# Patient Record
Sex: Female | Born: 1935 | Race: White | Hispanic: No | Smoking: Never smoker
Health system: Southern US, Community
[De-identification: ages and names within clinical notes are randomized; demographics above are authoritative.]

## PROBLEM LIST (undated history)

## (undated) DIAGNOSIS — E785 Hyperlipidemia, unspecified: Secondary | ICD-10-CM

## (undated) DIAGNOSIS — J189 Pneumonia, unspecified organism: Secondary | ICD-10-CM

## (undated) DIAGNOSIS — N390 Urinary tract infection, site not specified: Secondary | ICD-10-CM

## (undated) DIAGNOSIS — F329 Major depressive disorder, single episode, unspecified: Secondary | ICD-10-CM

## (undated) DIAGNOSIS — M8580 Other specified disorders of bone density and structure, unspecified site: Secondary | ICD-10-CM

## (undated) DIAGNOSIS — R55 Syncope and collapse: Secondary | ICD-10-CM

## (undated) DIAGNOSIS — M5090 Cervical disc disorder, unspecified, unspecified cervical region: Secondary | ICD-10-CM

## (undated) DIAGNOSIS — I10 Essential (primary) hypertension: Secondary | ICD-10-CM

## (undated) DIAGNOSIS — N8110 Cystocele, unspecified: Secondary | ICD-10-CM

## (undated) DIAGNOSIS — R413 Other amnesia: Secondary | ICD-10-CM

## (undated) DIAGNOSIS — M51369 Other intervertebral disc degeneration, lumbar region without mention of lumbar back pain or lower extremity pain: Secondary | ICD-10-CM

## (undated) DIAGNOSIS — F32A Depression, unspecified: Secondary | ICD-10-CM

## (undated) DIAGNOSIS — J45909 Unspecified asthma, uncomplicated: Secondary | ICD-10-CM

## (undated) DIAGNOSIS — I341 Nonrheumatic mitral (valve) prolapse: Secondary | ICD-10-CM

## (undated) DIAGNOSIS — Z950 Presence of cardiac pacemaker: Secondary | ICD-10-CM

## (undated) DIAGNOSIS — I872 Venous insufficiency (chronic) (peripheral): Secondary | ICD-10-CM

## (undated) DIAGNOSIS — M5136 Other intervertebral disc degeneration, lumbar region: Secondary | ICD-10-CM

## (undated) DIAGNOSIS — K5790 Diverticulosis of intestine, part unspecified, without perforation or abscess without bleeding: Secondary | ICD-10-CM

## (undated) DIAGNOSIS — E871 Hypo-osmolality and hyponatremia: Secondary | ICD-10-CM

## (undated) DIAGNOSIS — N200 Calculus of kidney: Secondary | ICD-10-CM

## (undated) HISTORY — DX: Hyperlipidemia, unspecified: E78.5

## (undated) HISTORY — DX: Unspecified asthma, uncomplicated: J45.909

## (undated) HISTORY — DX: Other intervertebral disc degeneration, lumbar region without mention of lumbar back pain or lower extremity pain: M51.369

## (undated) HISTORY — DX: Venous insufficiency (chronic) (peripheral): I87.2

## (undated) HISTORY — PX: TONSILLECTOMY: SUR1361

## (undated) HISTORY — DX: Cervical disc disorder, unspecified, unspecified cervical region: M50.90

## (undated) HISTORY — DX: Other specified disorders of bone density and structure, unspecified site: M85.80

## (undated) HISTORY — DX: Other intervertebral disc degeneration, lumbar region: M51.36

## (undated) HISTORY — DX: Syncope and collapse: R55

## (undated) HISTORY — DX: Cystocele, unspecified: N81.10

## (undated) HISTORY — DX: Diverticulosis of intestine, part unspecified, without perforation or abscess without bleeding: K57.90

## (undated) HISTORY — PX: APPENDECTOMY: SHX54

## (undated) HISTORY — DX: Other amnesia: R41.3

## (undated) HISTORY — DX: Major depressive disorder, single episode, unspecified: F32.9

## (undated) HISTORY — DX: Depression, unspecified: F32.A

## (undated) HISTORY — PX: ABDOMINAL HYSTERECTOMY: SHX81

---

## 1999-05-08 ENCOUNTER — Encounter (INDEPENDENT_AMBULATORY_CARE_PROVIDER_SITE_OTHER): Payer: Self-pay | Admitting: Specialist

## 1999-05-08 ENCOUNTER — Other Ambulatory Visit: Admission: RE | Admit: 1999-05-08 | Discharge: 1999-05-08 | Payer: Self-pay | Admitting: Obstetrics & Gynecology

## 1999-10-30 ENCOUNTER — Encounter: Payer: Self-pay | Admitting: Obstetrics & Gynecology

## 1999-10-30 ENCOUNTER — Encounter: Admission: RE | Admit: 1999-10-30 | Discharge: 1999-10-30 | Payer: Self-pay | Admitting: Obstetrics & Gynecology

## 1999-12-17 ENCOUNTER — Encounter: Payer: Self-pay | Admitting: *Deleted

## 1999-12-17 ENCOUNTER — Encounter: Admission: RE | Admit: 1999-12-17 | Discharge: 1999-12-17 | Payer: Self-pay | Admitting: *Deleted

## 2000-01-02 ENCOUNTER — Other Ambulatory Visit: Admission: RE | Admit: 2000-01-02 | Discharge: 2000-01-02 | Payer: Self-pay | Admitting: Urology

## 2000-04-17 ENCOUNTER — Other Ambulatory Visit: Admission: RE | Admit: 2000-04-17 | Discharge: 2000-04-17 | Payer: Self-pay | Admitting: Obstetrics & Gynecology

## 2000-05-29 ENCOUNTER — Encounter: Admission: RE | Admit: 2000-05-29 | Discharge: 2000-05-29 | Payer: Self-pay | Admitting: Orthopedic Surgery

## 2000-05-29 ENCOUNTER — Encounter: Payer: Self-pay | Admitting: Orthopedic Surgery

## 2000-11-02 ENCOUNTER — Encounter: Admission: RE | Admit: 2000-11-02 | Discharge: 2000-11-02 | Payer: Self-pay | Admitting: Geriatric Medicine

## 2000-11-02 ENCOUNTER — Encounter: Payer: Self-pay | Admitting: Geriatric Medicine

## 2000-12-25 ENCOUNTER — Encounter: Payer: Self-pay | Admitting: Geriatric Medicine

## 2000-12-25 ENCOUNTER — Encounter: Admission: RE | Admit: 2000-12-25 | Discharge: 2000-12-25 | Payer: Self-pay | Admitting: Geriatric Medicine

## 2001-03-22 ENCOUNTER — Encounter: Admission: RE | Admit: 2001-03-22 | Discharge: 2001-03-22 | Payer: Self-pay | Admitting: Geriatric Medicine

## 2001-03-22 ENCOUNTER — Encounter: Payer: Self-pay | Admitting: Geriatric Medicine

## 2001-04-27 ENCOUNTER — Ambulatory Visit (HOSPITAL_COMMUNITY): Admission: RE | Admit: 2001-04-27 | Discharge: 2001-04-27 | Payer: Self-pay | Admitting: Gastroenterology

## 2001-05-07 ENCOUNTER — Other Ambulatory Visit: Admission: RE | Admit: 2001-05-07 | Discharge: 2001-05-07 | Payer: Self-pay | Admitting: Obstetrics & Gynecology

## 2001-05-12 ENCOUNTER — Encounter: Payer: Self-pay | Admitting: Gastroenterology

## 2001-05-12 ENCOUNTER — Encounter: Admission: RE | Admit: 2001-05-12 | Discharge: 2001-05-12 | Payer: Self-pay | Admitting: Gastroenterology

## 2001-10-05 ENCOUNTER — Encounter: Payer: Self-pay | Admitting: Geriatric Medicine

## 2001-10-05 ENCOUNTER — Encounter: Admission: RE | Admit: 2001-10-05 | Discharge: 2001-10-05 | Payer: Self-pay | Admitting: Geriatric Medicine

## 2002-05-19 ENCOUNTER — Other Ambulatory Visit: Admission: RE | Admit: 2002-05-19 | Discharge: 2002-05-19 | Payer: Self-pay | Admitting: Obstetrics & Gynecology

## 2002-10-10 ENCOUNTER — Encounter: Payer: Self-pay | Admitting: Geriatric Medicine

## 2002-10-10 ENCOUNTER — Encounter: Admission: RE | Admit: 2002-10-10 | Discharge: 2002-10-10 | Payer: Self-pay | Admitting: Geriatric Medicine

## 2003-10-04 ENCOUNTER — Encounter (INDEPENDENT_AMBULATORY_CARE_PROVIDER_SITE_OTHER): Payer: Self-pay | Admitting: *Deleted

## 2003-10-05 ENCOUNTER — Inpatient Hospital Stay (HOSPITAL_COMMUNITY): Admission: RE | Admit: 2003-10-05 | Discharge: 2003-10-13 | Payer: Self-pay | Admitting: Obstetrics & Gynecology

## 2003-11-20 ENCOUNTER — Encounter: Admission: RE | Admit: 2003-11-20 | Discharge: 2003-11-20 | Payer: Self-pay | Admitting: Geriatric Medicine

## 2004-01-08 ENCOUNTER — Ambulatory Visit (HOSPITAL_COMMUNITY): Admission: RE | Admit: 2004-01-08 | Discharge: 2004-01-08 | Payer: Self-pay | Admitting: Geriatric Medicine

## 2004-11-26 ENCOUNTER — Encounter: Admission: RE | Admit: 2004-11-26 | Discharge: 2004-11-26 | Payer: Self-pay | Admitting: Geriatric Medicine

## 2005-06-11 ENCOUNTER — Other Ambulatory Visit: Admission: RE | Admit: 2005-06-11 | Discharge: 2005-06-11 | Payer: Self-pay | Admitting: Obstetrics & Gynecology

## 2005-12-02 ENCOUNTER — Encounter: Admission: RE | Admit: 2005-12-02 | Discharge: 2005-12-02 | Payer: Self-pay | Admitting: Geriatric Medicine

## 2005-12-31 ENCOUNTER — Encounter: Admission: RE | Admit: 2005-12-31 | Discharge: 2005-12-31 | Payer: Self-pay | Admitting: Geriatric Medicine

## 2006-04-16 ENCOUNTER — Ambulatory Visit (HOSPITAL_COMMUNITY): Admission: RE | Admit: 2006-04-16 | Discharge: 2006-04-17 | Payer: Self-pay | Admitting: Orthopedic Surgery

## 2006-12-21 ENCOUNTER — Encounter: Admission: RE | Admit: 2006-12-21 | Discharge: 2006-12-21 | Payer: Self-pay | Admitting: Geriatric Medicine

## 2007-01-14 ENCOUNTER — Ambulatory Visit (HOSPITAL_COMMUNITY): Admission: RE | Admit: 2007-01-14 | Discharge: 2007-01-14 | Payer: Self-pay | Admitting: Geriatric Medicine

## 2007-12-29 ENCOUNTER — Encounter: Admission: RE | Admit: 2007-12-29 | Discharge: 2007-12-29 | Payer: Self-pay | Admitting: Geriatric Medicine

## 2008-04-07 ENCOUNTER — Encounter: Admission: RE | Admit: 2008-04-07 | Discharge: 2008-04-07 | Payer: Self-pay | Admitting: Gastroenterology

## 2008-12-29 ENCOUNTER — Encounter: Admission: RE | Admit: 2008-12-29 | Discharge: 2008-12-29 | Payer: Self-pay | Admitting: Obstetrics & Gynecology

## 2010-01-02 ENCOUNTER — Encounter: Admission: RE | Admit: 2010-01-02 | Discharge: 2010-01-02 | Payer: Self-pay | Admitting: Obstetrics & Gynecology

## 2010-12-02 ENCOUNTER — Other Ambulatory Visit: Payer: Self-pay | Admitting: Obstetrics & Gynecology

## 2010-12-02 DIAGNOSIS — Z1231 Encounter for screening mammogram for malignant neoplasm of breast: Secondary | ICD-10-CM

## 2010-12-20 NOTE — Op Note (Signed)
NAME:  Gabrielle Hoffman, Gabrielle Hoffman                        ACCOUNT NO.:  [id number]   MEDICAL RECORD NO.:  [id number]                   PATIENT TYPE:  OBV   LOCATION:  9399                                 FACILITY:  [facility]   PHYSICIAN:  Freddy Finner, M.D.                DATE OF BIRTH:  04-14-1931   DATE OF PROCEDURE:  10/04/2003  DATE OF DISCHARGE:                                 OPERATIVE REPORT   PREOPERATIVE DIAGNOSIS:  Cystocele with prolapse, uterine prolapse,  rectocele, with pelvic relaxation symptoms.   POSTOPERATIVE DIAGNOSES:  1. Cystocele with prolapse, uterine prolapse, rectocele, with pelvic     relaxation symptoms.  2. Adhesions of left adnexa.   OPERATIVE PROCEDURE:  Laparoscopy-assisted vaginal hysterectomy, right  salpingo-oophorectomy, lysis of left adnexal adhesions, anterior and  posterior colporrhaphy, sacrospinous ligament suspension of vagina.   ANESTHESIA:  General endotracheal.   ESTIMATED INTRAOPERATIVE BLOOD LOSS:  400 mL.   INTRAOPERATIVE COMPLICATIONS:  None.   The patient was admitted on the morning of surgery, brought to the operating  room, there placed under adequate general anesthesia.  She was in PAS hose  which had been applied preoperatively.  She was given an antibiotic bolus of  Ceftin IV preoperatively.  She was brought to the operating room, placed  under adequate general anesthesia, placed in the dorsal lithotomy position  using the Liberty stirrup system.  Betadine prep of abdomen, perineum, and  vagina was carried out with scrub followed by solution of Betadine.  Sterile  drapes were applied.  Hulka tenaculum was attached to the cervix under  direct visualization.  Bladder was evacuated with a Robinson catheter.  Two  small incisions were made in the abdomen - one at the umbilicus, one just  above the symphysis.  The anterior abdominal wall was elevated manually.  A  bladed 11 mm disposable trocar was introduce at the umbilicus without  difficulty.  A nonbladed 5 mm trocar was placed through the lower incision  under direct visualization.  Examination revealed no evidence of injury on  entry.  The omentum was adherent to the anterior abdominal wall and was  penetrated on placement of the upper trocar but no bleeding was encountered  and no injury.  Examination of pelvic and abdominal contents was carried  out.  There were some adhesions in the right lower quadrant and the appendix  was not visualized.  It is presumed that it may have been removed in 1965 at  the time of her left salpingo-oophorectomy.  There were adhesions along the  round ligament and posterior fundal surface on the left side.  The right  tube and ovary were normal.  The uterus itself appeared to be normal.  Please note that findings are recorded in still photographs which were  retained in the office record.  Using the monopolar reticulated EndoShears  the adhesions in the left adnexa were freed from the uterus and  the round  ligament.  Using the Gyrus tripolar device the upper broad ligament was  dissected, sealed, and divided down to a level just above the uterine artery  on the left, similarly on the right, but this included the infundibulopelvic  ligament with resection of the right tube and ovary.  Attention was then  turned vaginally.  Colpotomy incision was made while tenting the mucosa  posterior to the cervix.  The cervix was circumscribed with a scalpel.  Anatomy was somewhat distorted by prolapse.  Great care was exercised to  carefully excise the bladder off the cervix.  Indigo carmine was injected IV  for use in identifying any potential injury.  Progressively, the uterus was  removed by developing and sealing the uterosacral ligaments on each side  with the LigaSure system.  The bladder pillar was resected with LigaSure,  sealed, and divided.  The anterior peritoneum was entered.  Cardinal  ligament pedicles were taken with the LigaSure,  sealed, and divided.  Please  note that the anterior peritoneum was entered as the uterus was delivered  posteriorly to identify the anterior fold which was far advanced from the  cervix.  No injury was encountered to the bladder.  Uterosacrals were then  anchored to mucosa with figure-of-eights of 0 Monocryl.  The large dissected  area posteriorly was closed and sutured with interrupted figure-of-eights of  0 Monocryl.  The posterior half of the cuff was closed vertically with  figure-of-eights of 0 Monocryl.  The anterior mucosal edges were grasped  with Allis clamps and the mucosa tented.  The bladder was sharply dissected  off of the mucosa and an incision made in the midline to a level just distal  to the urethral meatus by approximately 1.5 cm.  With great care the bladder  was dissected off the mucosa as was the urogenital fascia.  Using 0 Monocryl  sutures, plication of the urogenital fascia was carried out, bringing tissue  laterally to midline on each side.  Urethral depth and patency was checked  with a sterile Tresa Endo.  Urethral length was approximately 2.5 cm.  Segments  of mucosa were excised anteriorly.  The remaining cuff anterior peritoneum  was then closed with figure-of-eights of 0 Monocryl.  A deep suture was  required on the patient's left side before actually closing the mucosa  completely for hemostasis.  Attention was turned posteriorly.  The  fourchette was grasped on each side with an Allis.  A pyramidal-shaped  segment of skin was excised from the perineum.  The mucosa overlying the  rectum was tented and with sharp dissection the mucosa was freed from the  rectum, and with careful sharp and blunt dissection the mucosa was freed  from the perirectal supporting tissues.  The sacrospinous ligament was then  identified with blunt dissection carried up to that level.  Using the  __________ clamp a 0 PDS suture was placed through the right sacrospinous ligament.  This  was anchored to the anterior and posterior vaginal mucosa  using two bites with the Mayo needle.  This suture was then held until the  posterior repair was nearly complete.  This involved plication of the  perirectal tissue medially with interrupted sutures of 0 Monocryl.  Perineal  body and levators were also approximated with interrupted 0 Monocryl.  Using  3-0 Monocryl the posterior mucosa was closed to a level just distal to the  hymenal ring.  At this point the sacrospinous ligament suture was carefully  tied  with adequate elevation of the vagina.  The posterior repair was  completed in a fashion similar to an episiotomy with the 3-0 Monocryl  suture.  The bladder was then filled with irrigating solution.  Blue dye was  noted here and in no other location.  A suprapubic banana catheter was  placed without difficulty and anchored to the skin with 0 nylon sutures.  Two-inch iodoform gauze was then used to pack the vaginal canal.  Reinspection abdominally was then carried out and there were some  significant bleeding sources along the uterosacral pedicles and in the  midline where the uterosacrals were plicated.  These were easily controlled  with the Gyrus bipolar device.  The Nezhat irrigating system was used during  the procedure to irrigate and to confirm hemostasis.  The irrigating  solution was aspirated from the abdomen with adequate hemostasis.  The  procedure was complete.  The skin incisions were closed in interrupted  subcuticular sutures of 3-0 Dexon; 0.5% plain Marcaine was injected into the  incision sites for postoperative analgesia.  Steri-Strips were also applied  to the lower incision.  The patient was awakened and taken to recovery in  good condition.                                               Freddy Finner, M.D.    WRN/MEDQ  D:  10/04/2003  T:  10/04/2003  Job:  [id number]

## 2010-12-20 NOTE — H&P (Signed)
NAME:  Gabrielle Hoffman, Gabrielle Hoffman                        ACCOUNT NO.:  [id number]   MEDICAL RECORD NO.:  [id number]                   PATIENT TYPE:  OBV   LOCATION:  NA                                   FACILITY:  [facility]   PHYSICIAN:  Freddy Finner, M.D.                DATE OF BIRTH:  11-24-1930   DATE OF ADMISSION:  10/04/2003  DATE OF DISCHARGE:                                HISTORY & PHYSICAL   ADMITTING DIAGNOSES:  1. Third degree cystocele.  2. First degree rectocele.  3. Marked uterine descensus.  4. Symptoms of pelvic relaxation.   HISTORY OF PRESENT ILLNESS:  The patient is a 77 year old white widowed  female, gravida 1, para 1, who has been followed in my office since 1993.  Throughout her time in my office she has been on hormone replacement  therapy.  In the last approximately six months, the patient has noted  progressively increasing symptoms and feels that everything is falling out  vaginally.  The symptoms have reached the point that she has requested  surgical intervention, and she is now admitted for that purpose.   PAST MEDICAL HISTORY:  The patient is known to have mild hypertension for  which she takes hydrochlorothiazide 25 mg a day.  She is also known to have  hyperlipidemia, for which she takes Lipitor on a daily basis.  She is also  known to have osteopenia and she is taking hormone replacement therapy and  Fosamax 70 mg a week for this problem.   There are no other significant medical illnesses.   ALLERGIES:  Her only known drug allergy is to codeine.   PAST SURGICAL HISTORY:  1. Previous operative procedures include a vaginal delivery in 1969.  2. She had laparotomy in [place] in 1965 at which time she had removal     of the left tube and ovary as best she can remember and partial resection     of the right ovary done for endometriosis.   There are no other known surgical procedures.   FAMILY HISTORY:  The only remarkable thing in her family history  related to  surgery is she states her son is a hemophiliac.   Based on this and her concerns about the potential for bleeding  intraoperatively, she had preoperative coagulation studies which included a  normal platelet count, a normal prothrombin time, a normal PTT and normal IV  bleeding time.   REVIEW OF SYSTEMS:  Otherwise negative.  No cardiopulmonary, GI or GU  complaints at the present time.   PHYSICAL EXAMINATION:  VITAL SIGNS:  Blood pressure in the office was  136/74.  HEENT:  Grossly within normal limits.  NECK:  The thyroid gland is not palpably enlarged.  CHEST:  Clear to auscultation.  HEART:  Normal sinus rhythm without murmurs, rubs or gallops.  ABDOMEN:  Soft and nontender without appreciable organomegaly or pelvic  masses.  EXTREMITIES:  Without cyanosis, clubbing or edema.  BREASTS:  Exam in the office in the recent past was normal.  PELVIC:  The pelvic findings are as in the admission diagnosis.  No other  remarkable pelvic findings.   ASSESSMENT:  Symptomatic pelvic relaxation with third degree cystocele,  first degree rectocele, and marked uterine descensus.  Various forms of  therapy including different surgical approaches have been discussed with the  patient including the potential for using cadaver grafts and synthetic mesh  for repairs.  We have elected to proceed at this time without the use of  those techniques.  She is now admitted for laparoscopic assisted vaginal  hysterectomy, resection of the remaining ovary or both ovaries if present  and anterior posterior colporrhaphy and sacrospinous ligament suspension.                                               Freddy Finner, M.D.    WRN/MEDQ  D:  10/03/2003  T:  10/03/2003  Job:  [id number]

## 2010-12-20 NOTE — Procedures (Signed)
[place]  Patient:    Gabrielle Hoffman, Gabrielle Hoffman Visit Number: [id number] MRN: [id number]          Service Type: END Location: ENDO Attending Physician:  Orland Mustard Dictated by:   Llana Aliment. Randa Evens, M.D. Proc. Date: 04/27/01 Admit Date:  04/27/2001   CC:         Hal T. Stoneking, M.D.   Procedure Report  PROCEDURE:  Incomplete colonoscopy.  MEDICATIONS:  Fentanyl 75 mcg, Versed 7.5 mg IV.  SCOPE:  Pediatric Olympus video colonoscope.  INDICATIONS:  Colon cancer screening.  DESCRIPTION OF PROCEDURE:  The procedure had been explained to the patient and consent obtained.  With the patient in the left lateral decubitus position digital examination was performed and the Olympus pediatric video colonoscope was inserted and advanced under direct visualization.  The prep was excellent.  The patient had extensive diverticular disease of the sigmoid colon.  Despite multiple maneuvers including position changes we were unable to advance beyond 45 cm.  Extensive diverticular disease in this area.  At this point, I felt it was just simply not safe to attempt to advance any further.  The scope was withdrawn.  The sigmoid colon other than diverticulosis was free of polyps. No other gross lesions were seen.  The scope was withdrawn down the rectum. The rectum was free of polyps.  ASSESSMENT:  Incomplete colonoscopy due to extensive diverticular disease.  PLAN:  Will have the patient call our office next week.  We will obtain a barium enema in the future to evaluate the remainder of her colon.  We will give her an information sheet about diverticulosis. Dictated by:   Llana Aliment. Randa Evens, M.D. Attending Physician:  Orland Mustard DD:  04/27/01 TD:  04/27/01 Job: [id number]

## 2010-12-20 NOTE — Discharge Summary (Signed)
NAME:  Gabrielle Hoffman, Gabrielle Hoffman                        ACCOUNT NO.:  [id number]   MEDICAL RECORD NO.:  [id number]                   PATIENT TYPE:  INP   LOCATION:  9311                                 FACILITY:  [facility]   PHYSICIAN:  Freddy Finner, M.D.                DATE OF BIRTH:  May 20, 1931   DATE OF ADMISSION:  10/04/2003  DATE OF DISCHARGE:  10/13/2003                                 DISCHARGE SUMMARY   DISCHARGE DIAGNOSIS:  Cystocele with prolapse, uterine prolapse, rectocele,  and marked symptoms of pelvic relaxation.   SECONDARY DIAGNOSES:  1. Persistent postoperative nausea of uncertain etiology.  2. Diarrhea, possibly secondary to intravenous antibiotic management.   OTHER COMPLICATIONS:  None.   DISPOSITION:  At the time of her discharge on postoperative day #9 the  patient was ambulating without difficulty.  She was having adequate dietary  intake.  She was voiding without difficulty without a suprapubic catheter.  Her condition is considered to be satisfactory.  She was discharged home  with progressively-increasing activities.  She was given instructions for  symptoms of Clostridium difficile to contact Dr. Matthias Hughs if that occurred.  Her discharge pain medications were Percocet 5/325 one or two q.4-6h. as  needed for postoperative pain.  She was also given Motrin to be taken t.i.d.  on a p.r.n. basis.  She is to return to the office in approximately 3 days.  Otherwise, her instructions were routine.   Details of the present illness, past history, family history, review of  systems, and physical exam recorded in the admission note.  Essentially, the  patient had symptomatic pelvic relaxation.   Her physical findings on admission were remarkable for the pelvic findings  which were essentially as noted in the postoperative note or the discharge  diagnosis.   Laboratory data during this admission included serial CBCs with initial  hemoglobin of 12.3, postoperative hemoglobin  of 9.1 on postoperative day #1,  and several follow-ups were done with a discharge hemoglobin of 8.6.  Admission CMET was normal.  Postoperative electrolytes were mildly abnormal  with potassium of 3.3 and sodium of 131 on postoperative day #1.  By  discharge her potassium was 3.6 and her sodium 138.  Urinalysis on admission  was normal.  Prior to this admission she had had coagulation evaluation  including a bleeding time, all of which were normal.  This was done because  of a history of hemophilia in the family.   HOSPITAL COURSE:  Following admission total vaginal hysterectomy, bilateral  salpingo-oophorectomy, anterior/posterior colporrhaphy, and sacrospinous  ligament suspension of the vagina were accomplished without significant  difficulty.  Postoperatively the ongoing problems were of loss of appetite  and nausea.  Because of the persistence of these symptoms Dr. Matthias Hughs was  asked to see the patient for GI evaluation.  His primary thought on this was  that she might have gastric stasis.  She was treated  with Reglan and  Carafate and on this regimen did make  some improvement, and by the time of her discharge on postoperative day #9  was doing reasonably well.  Her hospital course was otherwise without  significant complications.  She remained essentially afebrile throughout.  The disposition is noted above.                                               Freddy Finner, M.D.    WRN/MEDQ  D:  12/08/2003  T:  12/08/2003  Job:  [id number]   cc:   Bernette Redbird, M.D.  [address]  [place], [place] [address]  Fax: [phone]   Hal T. Stoneking, M.D.  [address] [place], [place] [address]  Fax: [phone]

## 2010-12-20 NOTE — Op Note (Signed)
Gabrielle Hoffman, Gabrielle Hoffman              ACCOUNT NO.:  [id number]   MEDICAL RECORD NO.:  [id number]          PATIENT TYPE:  AMB   LOCATION:  DAY                          FACILITY:  [facility]   PHYSICIAN:  Marlowe Kays, M.D.  DATE OF BIRTH:  12-24-1930   DATE OF PROCEDURE:  04/16/2006  DATE OF DISCHARGE:                                 OPERATIVE REPORT   PREOPERATIVE DIAGNOSES:  1. Glenohumeral arthritis with torn labrum.  2. Rotator cuff tendinopathy secondary to impingement syndrome.   POSTOPERATIVE DIAGNOSES:  1. Glenohumeral arthritis with torn labrum.  2. Rotator cuff tendinopathy secondary to impingement syndrome.   OPERATION:  Right shoulder arthroscopy with:   1. Debridement of torn labrum, removal of loose bodies and some minimal      shaving of the undersurface of the rotator cuff.  2. Arthroscopic subacromial and distal inferior clavicle decompression.   SURGEON:  Marlowe Kays, M.D.   ASSISTANTDruscilla Brownie. Underwood, P.A.-C.   ANESTHESIA:  General.   PATHOLOGY AND JUSTIFICATION FOR PROCEDURE:  For no apparent reason, in April  of this year she developed a pain the right shoulder going down her arm.  Plain x-rays and MRI films have demonstrated the above diagnoses.  Accordingly, she is here today for the above-mentioned surgery.   PROCEDURE:  Satisfactory general anesthesia, beach-chair position on the  Allen frame, right shoulder girdle was prepped with DuraPrep, draped in a  sterile field, and the anatomy of the shoulder joint was marked out and  posterior and lateral portals and subacromial space infiltrated with 0.5%  Marcaine with adrenalin.  Through a posterior soft spot portal I  atraumatically entered the glenohumeral joint.  She had a good bit of  synovitis present.  There was a large flap of torn labrum and multiple loose  bodies in the joint.  I advanced the scope between the biceps and  subscapularis using a switching stick.  I made the anterior  incision,  followed by a metal cannula, which I placed into the joint, and inside this  I used a 4.2 shaver to debride out the torn portion of labrum, I removed the  loose bodies and debrided the undersurface of the rotator cuff.  I then  redirected the scope to the subacromial space and through a lateral portal  introduced the 4.2 shaver here as well.  There was some slight bursal  tissue, which I cleaned up.  There was a significant impingement problem  both from the anterior acromion but also from the inferior distal clavicle.  Accordingly, I used the 90-degree ArthroCare vaporizer to begin removing  soft tissue from the undersurface of the acromion and the distal clavicle,  and I followed this with a 4 mm oval bur, burring down the offending bone.  I went back and forth between the shaver, of the vaporizer and the bur until  we had a wide decompression of both the anterior acromion and the inferior  distal clavicle.  We documented this with pictures with her arm to her side  and the arm abducted, then irrigated the shoulder joint until clear.  The  three portals were closed with 4-0 nylon and  infiltrated with 0.5% Marcaine with adrenalin, as was the subacromial space.  A Betadine Adaptic dry sterile dressing and a shoulder immobilizer were  applied.  She tolerated the procedure well and was taken to the recovery  room in satisfactory condition with no known complications.           ______________________________  Marlowe Kays, M.D.     JA/MEDQ  D:  04/16/2006  T:  04/17/2006  Job:  [id number]

## 2010-12-20 NOTE — Consult Note (Signed)
NAME:  Gabrielle Hoffman, Gabrielle Hoffman                        ACCOUNT NO.:  [id number]   MEDICAL RECORD NO.:  [id number]                   PATIENT TYPE:  INP   LOCATION:  9311                                 FACILITY:  [facility]   PHYSICIAN:  Gabrielle Hoffman, M.D.                DATE OF BIRTH:  [birth year]   DATE OF CONSULTATION:  10/11/2003  DATE OF DISCHARGE:                                   CONSULTATION   REASON FOR CONSULTATION:  Gabrielle Hoffman asked [place] to see this 77 year old female  because of nausea postoperatively.   Gabrielle Hoffman is about a week status post repair of a large cystocele,  uterine prolapse, and pelvic floor relaxation, including a laparoscopy-  assisted vaginal hysterectomy, right salpingo-oophorectomy, lysis of left  adnexal adhesions, anterior and posterior colporrhaphy, and vaginal  suspension.   The patient has no ongoing baseline GI complaints.  She has been seen in the  past by Gabrielle Hoffman for colon cancer screening and was found to have  extensive diverticulosis impairing complete examination of the colon by  colonoscopy when performed about 3 years ago, so a barium enema was done to  supplement the incomplete colonoscopy and confirmed the presence of  extensive diverticulosis but no precancerous lesions.   With that baseline, the patient made initially good postoperative progress,  but beginning approximately 72 hours ago at 6 p.m. on Sunday she began to  have rather distinct nausea which has been persistent and low-grade since  that time causing her to pretty much cut back just to liquids and very small  amounts of solid food.  She had a low-grade fever the night before last  prompting initiation of Unasyn but the nausea was present prior to that.  Also, note that the patient has used no pain medication for the past 24-48  hours; thus, the nausea cannot readily be attributed to that.   The patient did have a couple of diarrheal bowel movements today but that  was  approximately 36 hours after initiation of Unasyn which might account  for that finding.  However, it also correlated in time with initiation of  Reglan which had been started because of the nausea.   At this time, the patient is eating small amounts of solid food and is not  in any acute distress.   Note that the patient has not had any significant abdominal pain in  association with this nausea.   PAST MEDICAL HISTORY:  Allergy to SULFA DRUGS (rash) and possible (unknown)  reaction to CODEINE.  Outpatient medications included Lipitor, Fosamax,  hydrochlorothiazide, calcium and vitamin supplements, and daily aspirin.  Here in the hospital she is on Protonix, p.r.n. Zofran, and Unasyn.  Operations include a remote left salpingo-oophorectomy for endometriosis in  1965 and then the above-mentioned contralateral oophorectomy and vaginal  hysterectomy performed a week ago.  Medical illnesses:  Hypertension,  hyperlipidemia, mitral valve prolapse.  No known coronary  disease, MI, COPD,  or diabetes.   HABITS:  Life-long nonsmoker, nondrinker.   FAMILY HISTORY:  Gallbladder disease in the patient's mother and  diverticular disease in her sister.   SOCIAL HISTORY:  The patient is widowed and stays with her son who is at the  bedside.  She is a retired Runner, broadcasting/film/video.   REVIEW OF SYSTEMS:  Pertinent for intermittent dysphagia symptoms suggestive  of an esophageal ring.  Apparently she had radiographic evaluation for this  some time ago at [facility].   PHYSICAL EXAMINATION:  GENERAL:  Gabrielle Hoffman looks great at this time and  is not in any evident distress.  She was eating a baked potato when I came  in.  She has had, perhaps, 30% of the food on her tray.  She is anicteric  and without frank pallor.  CHEST:  Clear to auscultation.  HEART:  Without gallops, rubs, murmurs, clicks, or arrhythmias.  ABDOMEN:  Nondistended, has normal bowel sounds, no bruits, no succussion  splash.  No organomegaly,  guarding, mass, or significant tenderness.   LABORATORY DATA:  Recent labs include white count of 8100, hemoglobin of 8.9  with a normal MCV.  Platelets have been in the normal range for the most  part.  Differential count unremarkable.  INR normal.  BMET normal.  Liver  chemistries not checked.   IMPRESSION:  1. Nausea of unclear etiology, clearly of acute onset following surgery.  2. Diarrhea, again, acute onset since going on various medications     postoperatively.   RECOMMENDATIONS:  1. Trial of sucralfate in case this is bile reflux related to gastric stasis     from relatively small amounts of food intake, previous pain medications,     immobility, etc.  2. Encourage ambulation.  3. If possible, I would recommend stopping the patient's Unasyn since nausea     is a possible side effect of that medication and it could be contributing     to her diarrhea.  4. I will stop her Colace, Milk of Magnesia, and simethicone in view of the     fact she has had some diarrhea.  5. Consider possible re-initiation of Reglan if the patient's symptoms     persist but since she got diarrhea after it was started, even though it     is not typically associated with diarrhea as a side effect, I would like     to hold off reintroducing it in to the picture at the moment.  6. If the patient continued to have persistent symptoms we could consider     evaluation of the gallbladder and/or upper endoscopy but I think that     these tests would be of low yield.  Basically, I do not think the patient     has an intrinsic GI tract disorder and I expect her symptoms should     resolve in the near future; specifically, the next couple of days.  By     more frequent small food intake, the patient's gastric motility will     improve and she will have progressive diminution of her nausea.                                               Gabrielle Hoffman, M.D.   RB/MEDQ  D:  10/11/2003  T:  10/12/2003  Job:  [id number]   cc:   Gabrielle Hoffman, M.D.  [address]  [place], [place] [address]  Fax: [phone]   Gabrielle Hoffman., M.D.  [address], [address]  [place]  [place] [address]  Fax: [phone]

## 2011-01-06 ENCOUNTER — Ambulatory Visit
Admission: RE | Admit: 2011-01-06 | Discharge: 2011-01-06 | Disposition: A | Discharge status: Home / Self Care | Payer: Medicare Other | Source: Ambulatory Visit | Attending: Obstetrics & Gynecology | Admitting: Obstetrics & Gynecology

## 2011-01-06 DIAGNOSIS — Z1231 Encounter for screening mammogram for malignant neoplasm of breast: Secondary | ICD-10-CM

## 2011-05-12 DIAGNOSIS — H40019 Open angle with borderline findings, low risk, unspecified eye: Secondary | ICD-10-CM | POA: Insufficient documentation

## 2011-05-12 DIAGNOSIS — H02839 Dermatochalasis of unspecified eye, unspecified eyelid: Secondary | ICD-10-CM | POA: Insufficient documentation

## 2011-05-12 DIAGNOSIS — H2510 Age-related nuclear cataract, unspecified eye: Secondary | ICD-10-CM | POA: Insufficient documentation

## 2011-09-01 DIAGNOSIS — Z124 Encounter for screening for malignant neoplasm of cervix: Secondary | ICD-10-CM | POA: Diagnosis not present

## 2011-10-16 DIAGNOSIS — I10 Essential (primary) hypertension: Secondary | ICD-10-CM | POA: Diagnosis not present

## 2011-10-16 DIAGNOSIS — Z79899 Other long term (current) drug therapy: Secondary | ICD-10-CM | POA: Diagnosis not present

## 2011-12-10 DIAGNOSIS — R1032 Left lower quadrant pain: Secondary | ICD-10-CM | POA: Diagnosis not present

## 2011-12-15 ENCOUNTER — Other Ambulatory Visit: Payer: Self-pay | Admitting: Internal Medicine

## 2011-12-15 ENCOUNTER — Ambulatory Visit
Admission: RE | Admit: 2011-12-15 | Discharge: 2011-12-15 | Disposition: A | Discharge status: Home / Self Care | Payer: Medicare Other | Source: Ambulatory Visit | Attending: Internal Medicine | Admitting: Internal Medicine

## 2011-12-15 DIAGNOSIS — N390 Urinary tract infection, site not specified: Secondary | ICD-10-CM | POA: Diagnosis not present

## 2011-12-15 DIAGNOSIS — K5730 Diverticulosis of large intestine without perforation or abscess without bleeding: Secondary | ICD-10-CM | POA: Diagnosis not present

## 2011-12-15 DIAGNOSIS — R109 Unspecified abdominal pain: Secondary | ICD-10-CM | POA: Diagnosis not present

## 2011-12-15 DIAGNOSIS — M545 Low back pain: Secondary | ICD-10-CM | POA: Diagnosis not present

## 2011-12-15 MED ORDER — IOHEXOL 300 MG/ML  SOLN
100.0000 mL | Freq: Once | INTRAMUSCULAR | Status: AC | PRN
  Administered 2011-12-15: 100 mL via INTRAVENOUS

## 2011-12-17 ENCOUNTER — Inpatient Hospital Stay (HOSPITAL_COMMUNITY)
Admission: EM | Admit: 2011-12-17 | Discharge: 2011-12-19 | DRG: 641 | Disposition: A | Discharge status: Home / Self Care | Payer: Medicare Other | Source: Ambulatory Visit | Attending: Family Medicine | Admitting: Family Medicine

## 2011-12-17 ENCOUNTER — Encounter (HOSPITAL_COMMUNITY): Payer: Self-pay | Admitting: *Deleted

## 2011-12-17 DIAGNOSIS — Z7982 Long term (current) use of aspirin: Secondary | ICD-10-CM

## 2011-12-17 DIAGNOSIS — R1110 Vomiting, unspecified: Secondary | ICD-10-CM

## 2011-12-17 DIAGNOSIS — G8929 Other chronic pain: Secondary | ICD-10-CM | POA: Diagnosis present

## 2011-12-17 DIAGNOSIS — I10 Essential (primary) hypertension: Secondary | ICD-10-CM | POA: Diagnosis present

## 2011-12-17 DIAGNOSIS — N3000 Acute cystitis without hematuria: Secondary | ICD-10-CM | POA: Diagnosis not present

## 2011-12-17 DIAGNOSIS — M545 Low back pain, unspecified: Secondary | ICD-10-CM | POA: Diagnosis present

## 2011-12-17 DIAGNOSIS — Z79899 Other long term (current) drug therapy: Secondary | ICD-10-CM

## 2011-12-17 DIAGNOSIS — E860 Dehydration: Secondary | ICD-10-CM | POA: Diagnosis not present

## 2011-12-17 DIAGNOSIS — Z66 Do not resuscitate: Secondary | ICD-10-CM | POA: Diagnosis present

## 2011-12-17 DIAGNOSIS — E871 Hypo-osmolality and hyponatremia: Secondary | ICD-10-CM

## 2011-12-17 DIAGNOSIS — F0390 Unspecified dementia without behavioral disturbance: Secondary | ICD-10-CM | POA: Diagnosis present

## 2011-12-17 DIAGNOSIS — N390 Urinary tract infection, site not specified: Secondary | ICD-10-CM | POA: Diagnosis present

## 2011-12-17 HISTORY — DX: Hypo-osmolality and hyponatremia: E87.1

## 2011-12-17 HISTORY — DX: Essential (primary) hypertension: I10

## 2011-12-17 HISTORY — DX: Urinary tract infection, site not specified: N39.0

## 2011-12-17 HISTORY — DX: Nonrheumatic mitral (valve) prolapse: I34.1

## 2011-12-17 LAB — CBC
HCT: 30.6 % — ABNORMAL LOW (ref 36.0–46.0)
HCT: 29.9 % — ABNORMAL LOW (ref 36.0–46.0)
Hemoglobin: 11.2 g/dL — ABNORMAL LOW (ref 12.0–15.0)
Hemoglobin: 10.8 g/dL — ABNORMAL LOW (ref 12.0–15.0)
MCH: 30.5 pg (ref 26.0–34.0)
MCH: 30.3 pg (ref 26.0–34.0)
MCHC: 36.6 g/dL — ABNORMAL HIGH (ref 30.0–36.0)
MCHC: 36.1 g/dL — ABNORMAL HIGH (ref 30.0–36.0)
MCV: 83.4 fL (ref 78.0–100.0)
MCV: 84 fL (ref 78.0–100.0)
Platelets: 314 10*3/uL (ref 150–400)
RBC: 3.67 MIL/uL — ABNORMAL LOW (ref 3.87–5.11)
RDW: 12.4 % (ref 11.5–15.5)
RDW: 12.6 % (ref 11.5–15.5)
WBC: 8.5 10*3/uL (ref 4.0–10.5)

## 2011-12-17 LAB — BASIC METABOLIC PANEL
BUN: 9 mg/dL (ref 6–23)
CO2: 25 mEq/L (ref 19–32)
Calcium: 8.8 mg/dL (ref 8.4–10.5)
Calcium: 8.6 mg/dL (ref 8.4–10.5)
Chloride: 78 mEq/L — ABNORMAL LOW (ref 96–112)
Creatinine, Ser: 0.64 mg/dL (ref 0.50–1.10)
GFR calc Af Amer: >90 mL/min (ref >90)
GFR calc Af Amer: >90 mL/min (ref >90)
GFR calc non Af Amer: 83 mL/min — ABNORMAL LOW (ref >90)
GFR calc non Af Amer: 83 mL/min — ABNORMAL LOW (ref >90)
Glucose, Bld: 123 mg/dL — ABNORMAL HIGH (ref 70–99)
Potassium: 3.8 mEq/L (ref 3.5–5.1)
Potassium: 4.1 mEq/L (ref 3.5–5.1)
Sodium: 114 mEq/L — CL (ref 135–145)
Sodium: 120 mEq/L — ABNORMAL LOW (ref 135–145)

## 2011-12-17 LAB — URINALYSIS, ROUTINE W REFLEX MICROSCOPIC
Bilirubin Urine: NEGATIVE
Glucose, UA: NEGATIVE mg/dL
Ketones, ur: 15 mg/dL — AB
Nitrite: NEGATIVE
Protein, ur: NEGATIVE mg/dL
Specific Gravity, Urine: 1.014 (ref 1.005–1.030)
Urobilinogen, UA: 1 mg/dL (ref 0.0–1.0)
pH: 7.5 (ref 5.0–8.0)

## 2011-12-17 LAB — URINE MICROSCOPIC-ADD ON

## 2011-12-17 LAB — CORTISOL: Cortisol, Plasma: 17.9 ug/dL

## 2011-12-17 MED ORDER — CEPHALEXIN 500 MG PO CAPS
500.0000 mg | ORAL_CAPSULE | Freq: Three times a day (TID) | ORAL | Status: DC
  Administered 2011-12-17 – 2011-12-19 (×6): 500 mg via ORAL
  Filled 2011-12-17 (×9): qty 1

## 2011-12-17 MED ORDER — ONDANSETRON 8 MG/NS 50 ML IVPB
8.0000 mg | Freq: Once | INTRAVENOUS | Status: AC
  Administered 2011-12-17: 8 mg via INTRAVENOUS
  Filled 2011-12-17: qty 8

## 2011-12-17 MED ORDER — ONDANSETRON HCL 4 MG/2ML IJ SOLN: 4.0000 mg | Freq: Three times a day (TID) | INTRAMUSCULAR | Status: DC | PRN

## 2011-12-17 MED ORDER — ONDANSETRON HCL 4 MG/2ML IJ SOLN: 4.0000 mg | Freq: Four times a day (QID) | INTRAMUSCULAR | Status: DC | PRN

## 2011-12-17 MED ORDER — ZOLPIDEM TARTRATE 5 MG PO TABS
5.0000 mg | ORAL_TABLET | Freq: Once | ORAL | Status: AC
  Administered 2011-12-17: 5 mg via ORAL
  Filled 2011-12-17: qty 1

## 2011-12-17 MED ORDER — SODIUM CHLORIDE 0.9 % IV SOLN
INTRAVENOUS | Status: DC
  Administered 2011-12-18 (×2): 125 mL/h via INTRAVENOUS
  Administered 2011-12-19: 11:00:00 via INTRAVENOUS

## 2011-12-17 MED ORDER — ATORVASTATIN CALCIUM 10 MG PO TABS
10.0000 mg | ORAL_TABLET | Freq: Every day | ORAL | Status: DC
  Administered 2011-12-17 – 2011-12-18 (×2): 10 mg via ORAL
  Filled 2011-12-17 (×3): qty 1

## 2011-12-17 MED ORDER — SODIUM CHLORIDE 0.9 % IV SOLN: INTRAVENOUS | Status: DC

## 2011-12-17 MED ORDER — ONDANSETRON HCL 4 MG PO TABS: 4.0000 mg | ORAL_TABLET | Freq: Four times a day (QID) | ORAL | Status: DC | PRN

## 2011-12-17 MED ORDER — OXYCODONE HCL 5 MG PO TABS: 5.0000 mg | ORAL_TABLET | ORAL | Status: DC | PRN

## 2011-12-17 MED ORDER — ENOXAPARIN SODIUM 40 MG/0.4ML ~~LOC~~ SOLN
40.0000 mg | Freq: Every day | SUBCUTANEOUS | Status: DC
  Administered 2011-12-17 – 2011-12-18 (×2): 40 mg via SUBCUTANEOUS
  Filled 2011-12-17 (×3): qty 0.4

## 2011-12-17 MED ORDER — SODIUM CHLORIDE 0.9 % IV SOLN
INTRAVENOUS | Status: DC
  Administered 2011-12-17: 19:00:00 via INTRAVENOUS

## 2011-12-17 MED ORDER — ACETAMINOPHEN 325 MG PO TABS: 650.0000 mg | ORAL_TABLET | Freq: Four times a day (QID) | ORAL | Status: DC | PRN

## 2011-12-17 MED ORDER — ASPIRIN EC 81 MG PO TBEC
81.0000 mg | DELAYED_RELEASE_TABLET | Freq: Every day | ORAL | Status: DC
  Administered 2011-12-17 – 2011-12-19 (×3): 81 mg via ORAL
  Filled 2011-12-17 (×3): qty 1

## 2011-12-17 MED ORDER — ALUM & MAG HYDROXIDE-SIMETH 200-200-20 MG/5ML PO SUSP: 30.0000 mL | Freq: Four times a day (QID) | ORAL | Status: DC | PRN

## 2011-12-17 MED ORDER — SODIUM CHLORIDE 0.9 % IJ SOLN
3.0000 mL | Freq: Two times a day (BID) | INTRAMUSCULAR | Status: DC
  Administered 2011-12-18: 3 mL via INTRAVENOUS

## 2011-12-17 MED ORDER — SODIUM CHLORIDE 0.9 % IV BOLUS (SEPSIS)
1000.0000 mL | Freq: Once | INTRAVENOUS | Status: AC
  Administered 2011-12-17: 1000 mL via INTRAVENOUS

## 2011-12-17 NOTE — Progress Notes (Signed)
Pt's daughter in law called to check on patient. Pt is in no distress and is currently talking on phone to a family member. Will continue to monitor.

## 2011-12-17 NOTE — Progress Notes (Signed)
Cosign for Rashida Haney RN assessment, med admin, I&O, and notes 

## 2011-12-17 NOTE — ED Notes (Signed)
Patient with history of three day vomiting and was sent here from MD for evaluation.  Patient taking zofran w/o any relief.  Patient is was currently being treated for UTI.

## 2011-12-17 NOTE — ED Notes (Signed)
Pt from home.  Reports that she went to the PCP and was dx with a UTI.  Pt did have a CT scan after taking a couple days of antibiotic and was told that her urine was clearing up.  Pt denies pain or nausea at this time.  Reports that she has had nausea and vomiting x 3 days.  No acute distress noted.

## 2011-12-17 NOTE — Progress Notes (Signed)
Pt admitted to 4731. Pt placed on tele, VS taken, and assessed. Pt does not appear in any immediate distress. Dr. Irene Limbo notified of pt's arrival to floor. Will continue to monitor.

## 2011-12-17 NOTE — ED Notes (Signed)
Requested urine sample from patient.  Patient stated that she would like to try to void even though she voided before she came to .  Patient has been placed on bed pan.

## 2011-12-17 NOTE — H&P (Signed)
[facility] History and Physical  WHITNEY HILLEGASS UJW:[id number] DOB: 1930-08-05 DOA: 12/17/2011  Referring physician: Raeford Razor, MD PCP: Ginette Otto, MD, MD   Chief Complaint: vomiting  HPI:  78 year old woman found to have profound hyponatremia. Developed nausea approximately one week ago and was treated for bladder infection. Symptoms worsened and 2 days ago was seen in her primary care physician's office. At that time her sodium was noted to be low (exact number is unknown) and she was told to discontinue hydrochlorothiazide and lisinopril. Last night she had worsening of nausea and vomiting and over the last 2 days she has had very little to eat or drink as she cannot tolerate it. On-call physician referred her to the emergency department last night for further evaluation. Family Hilda Lias, son's girlfriend) notes she has very mild dementia and may have continued to take these medications. At baseline her diet is very poor for many months now.  In the Emergency Department the patient was afebrile with stable vital signs. Laboratory investigation revealed a sodium of 114, chloride 78, hemoglobin 11.2. CT scan performed May 13 was unremarkable. HCTZ and ACE-I stopped 48 hours ago.  Review of Systems:  No fever, changes to her vision, sore throat, rash, muscle aches, chest pain, shortness of breath, dysuria, bleeding.  Past Medical History  Diagnosis Date  . UTI (lower urinary tract infection)   . Hypertension   . Mitral valve prolapse   . Hyponatremia 12/17/2011  . DEMENTIA     very mild    Past Surgical History  Procedure Date  . Abdominal hysterectomy   . Tonsillectomy    Social History:  reports that she has never smoked. She does not have any smokeless tobacco history on file. She reports that she does not drink alcohol. Her drug history not on file.  No Known Allergies  Family History  Problem Relation Age of Onset  . Cancer Sister    Prior to  Admission medications   Medication Sig Start Date End Date Taking? Authorizing Provider  acetaminophen (TYLENOL) 325 MG tablet Take 650 mg by mouth every 6 (six) hours as needed. Pain/fever.   Yes Historical Provider, MD  aspirin EC 81 MG tablet Take 81 mg by mouth daily.   Yes Historical Provider, MD  atorvastatin (LIPITOR) 10 MG tablet Take 10 mg by mouth daily.   Yes Historical Provider, MD  calcium-vitamin D (OSCAL WITH D) 500-200 MG-UNIT per tablet Take 1 tablet by mouth daily.   Yes Historical Provider, MD  cephALEXin (KEFLEX) 500 MG capsule Take 500 mg by mouth 3 (three) times daily. 7 days. Patient started on 12/12/11.   Yes Historical Provider, MD  hydrochlorothiazide (HYDRODIURIL) 25 MG tablet Take 25 mg by mouth daily.   Yes Historical Provider, MD  ibuprofen (ADVIL,MOTRIN) 200 MG tablet Take 400 mg by mouth every 6 (six) hours as needed. Fever/pain.   Yes Historical Provider, MD  lisinopril (PRINIVIL,ZESTRIL) 20 MG tablet Take 20 mg by mouth daily.   Yes Historical Provider, MD  ondansetron (ZOFRAN) 4 MG tablet Take 4 mg by mouth every 8 (eight) hours as needed. Nausea/vomiting.   Yes Historical Provider, MD   Physical Exam: Filed Vitals:   12/17/11 1020 12/17/11 1123 12/17/11 1320  BP: 164/49 162/54 135/46  Pulse: 91 100 98  Temp: 98.4 F (36.9 C)  98.4 F (36.9 C)  TempSrc:   Oral  Resp: 16 14 11   SpO2: 100% 100% 98%    General:  Appears calm and  comfortable.  Eyes: Pupils equal, round, react to light. Normal lids, irises, conjunctiva.  ENT: Lips and tongue appear unremarkable. Hearing grossly normal.  Neck: No lymphadenopathy or masses. No thyromegaly.  Cardiovascular: Regular rate and rhythm. No murmur, rub, gallop. No significant lower extremity edema  Respiratory: Clear to auscultation bilaterally. No wheezes, rales, rhonchi. Normal respiratory effort.  Abdomen: Soft. Nondistended. Nontender.  Skin: Appears grossly unremarkable. No rash or induration  seen.  Musculoskeletal: Grossly normal tone and strength upper and lower extremities bilaterally.  Psychiatric: Grossly normal mood and affect. Speech fluent and appropriate.  Labs on Admission:  Basic Metabolic Panel:  Lab 12/17/11 1610  NA 114*  K 3.8  CL 78*  CO2 25  GLUCOSE 123*  BUN 9  CREATININE 0.64  CALCIUM 8.8  MG --  PHOS --   CBC:  Lab 12/17/11 1131  WBC 8.5  NEUTROABS --  HGB 11.2*  HCT 30.6*  MCV 83.4  PLT 314   Radiological Exams on Admission: Ct Abdomen Pelvis W Contrast  12/15/2011  *RADIOLOGY REPORT*  Clinical Data: Left lower back pain, positive urine culture.  Prior appendectomy and total hysterectomy  CT ABDOMEN AND PELVIS WITH CONTRAST  Technique:  Multidetector CT imaging of the abdomen and pelvis was performed following the standard protocol during bolus administration of intravenous contrast.  Contrast: OMNIPAQUE IOHEXOL 300 MG/ML  SOLN  Comparison: CT colonoscopy 04/07/2008  Findings: Right middle lobe granulomata noted.  Lung bases otherwise clear.  1.5 cm right lower renal pole cortical cyst noted.  Adrenal glands, left kidney, spleen, pancreas, and liver are normal.  No free fluid or lymphadenopathy.  Moderate atherosclerotic aortic calcification.  No free air.  Appendix surgically absent as per history. Scattered colonic diverticuli noted without evidence for diverticulitis.  No bowel wall thickening or focal segmental dilatation.  The bladder is normal.  Uterus and ovaries surgically absent.  No adnexal mass.  Mild rightward lumbar curvature centered at L3 noted.  Multilevel lumbar spine disc degenerative change.  No acute osseous abnormality.  IMPRESSION: No acute intra-abdominal or pelvic pathology.  Diverticulosis without CT evidence for diverticulitis.  Original Report Authenticated By: Harrel Lemon, M.D.   EKG: Independently reviewed. Sinus rhythm. Left bundle branch block. No previous EKG available for  comparison.  Assessment/Plan Principal Problem:  *Hyponatremia Active Problems:  Dehydration  UTI (lower urinary tract infection)  HTN (hypertension)  Dementia  1. Severe hyponatremia: Asymptomatic. Family reports patient very near baseline with minimal confusion. Most likely secondary to ACE inhibitor, hydrochlorothiazide combined with chronic poor diet and acute nausea/vomiting and poor oral intake. IV fluids, serial basic metabolic panel. Check TSH and cortisol though likely to be a low yield. 2. Dehydration: Plan as above. 3. UTI: Finishes outpatient Keflex 5/17. 4. Hypertension: Monitor off lisinopril and hydrochlorothiazide. May need an alternate agent. 5. Mild dementia: Appears stable.  Code Status: DO NOT RESUSCITATE Family Communication: Discussed with son's girlfriend bedside with the patient's permission. She will update the patient's son. Disposition Plan: Home when improved.  Brendia Sacks, MD  [facility] Pager [phone]  If 8PM-8AM, please contact night-coverage [email or web address] Password [email or web address] 12/17/2011, 1:40 PM

## 2011-12-17 NOTE — ED Provider Notes (Signed)
History     80yf with n/v. Onset about a week ago but worse in last few days. Seen by pcp around onset of symptoms and diagnosed with uti and finished course of abx for this although not sure what she was taking. Also noted to be hyponatremic but she is unsure to what degree. Instructed to stop taking HCTZ and ACEI. Symptoms continued and worse in last 2-3 days. Mild diffuse abdominal pain. No fever or chills. No dysuria. No diarrhea.   CSN: [id number]  Arrival date & time 12/17/11  1006   First MD Initiated Contact with Patient 12/17/11 1058      Chief Complaint  Patient presents with  . Emesis    (Consider location/radiation/quality/duration/timing/severity/associated sxs/prior treatment) HPI  Past Medical History  Diagnosis Date  . UTI (lower urinary tract infection)   . Hypertension     History reviewed. No pertinent past surgical history.  History reviewed. No pertinent family history.  History  Substance Use Topics  . Smoking status: Never Smoker   . Smokeless tobacco: Not on file  . Alcohol Use: No    OB History    Grav Para Term Preterm Abortions TAB SAB Ect Mult Living                  Review of Systems   Review of symptoms negative unless otherwise noted in HPI.   Allergies  Review of patient's allergies indicates no known allergies.  Home Medications   Current Outpatient Rx  Name Route Sig Dispense Refill  . ACETAMINOPHEN 325 MG PO TABS Oral Take 650 mg by mouth every 6 (six) hours as needed. Pain/fever.    . ASPIRIN EC 81 MG PO TBEC Oral Take 81 mg by mouth daily.    . ATORVASTATIN CALCIUM 10 MG PO TABS Oral Take 10 mg by mouth daily.    Marland Kitchen CALCIUM CARBONATE-VITAMIN D 500-200 MG-UNIT PO TABS Oral Take 1 tablet by mouth daily.    . CEPHALEXIN 500 MG PO CAPS Oral Take 500 mg by mouth 3 (three) times daily. 7 days. Patient started on 12/12/11.    Marland Kitchen HYDROCHLOROTHIAZIDE 25 MG PO TABS Oral Take 25 mg by mouth daily.    . IBUPROFEN 200 MG PO TABS Oral  Take 400 mg by mouth every 6 (six) hours as needed. Fever/pain.    Marland Kitchen LISINOPRIL 20 MG PO TABS Oral Take 20 mg by mouth daily.    Marland Kitchen ONDANSETRON HCL 4 MG PO TABS Oral Take 4 mg by mouth every 8 (eight) hours as needed. Nausea/vomiting.      BP 162/54  Pulse 100  Temp 98.4 F (36.9 C)  Resp 14  SpO2 100%  Physical Exam  Nursing note and vitals reviewed. Constitutional: She appears well-developed and well-nourished. No distress.  HENT:  Head: Normocephalic and atraumatic.  Eyes: Conjunctivae are normal. Right eye exhibits no discharge. Left eye exhibits no discharge.  Neck: Neck supple.  Cardiovascular: Normal rate, regular rhythm and normal heart sounds.  Exam reveals no gallop and no friction rub.   No murmur heard. Pulmonary/Chest: Effort normal and breath sounds normal. No respiratory distress.  Abdominal: Soft. She exhibits no distension. There is tenderness. There is no rebound and no guarding.         Mild tenderness LLQ without gurading/rebound. No distension.  Musculoskeletal: She exhibits no edema and no tenderness.  Neurological: She is alert.  Skin: Skin is warm and dry.  Psychiatric: She has a normal mood and affect. Her  behavior is normal. Thought content normal.    ED Course  Procedures (including critical care time)  Labs Reviewed  CBC - Abnormal; Notable for the following:    RBC 3.67 (*)    Hemoglobin 11.2 (*)    HCT 30.6 (*)    MCHC 36.6 (*)    All other components within normal limits  BASIC METABOLIC PANEL - Abnormal; Notable for the following:    Sodium 114 (*)    Chloride 78 (*)    Glucose, Bld 123 (*)    GFR calc non Af Amer 83 (*)    All other components within normal limits  URINALYSIS, ROUTINE W REFLEX MICROSCOPIC - Abnormal; Notable for the following:    APPearance CLOUDY (*)    Hgb urine dipstick SMALL (*)    Ketones, ur 15 (*)    Leukocytes, UA SMALL (*)    All other components within normal limits  URINE MICROSCOPIC-ADD ON - Abnormal;  Notable for the following:    Squamous Epithelial / LPF FEW (*)    Bacteria, UA FEW (*)    All other components within normal limits   Ct Abdomen Pelvis W Contrast  12/15/2011  *RADIOLOGY REPORT*  Clinical Data: Left lower back pain, positive urine culture.  Prior appendectomy and total hysterectomy  CT ABDOMEN AND PELVIS WITH CONTRAST  Technique:  Multidetector CT imaging of the abdomen and pelvis was performed following the standard protocol during bolus administration of intravenous contrast.  Contrast: OMNIPAQUE IOHEXOL 300 MG/ML  SOLN  Comparison: CT colonoscopy 04/07/2008  Findings: Right middle lobe granulomata noted.  Lung bases otherwise clear.  1.5 cm right lower renal pole cortical cyst noted.  Adrenal glands, left kidney, spleen, pancreas, and liver are normal.  No free fluid or lymphadenopathy.  Moderate atherosclerotic aortic calcification.  No free air.  Appendix surgically absent as per history. Scattered colonic diverticuli noted without evidence for diverticulitis.  No bowel wall thickening or focal segmental dilatation.  The bladder is normal.  Uterus and ovaries surgically absent.  No adnexal mass.  Mild rightward lumbar curvature centered at L3 noted.  Multilevel lumbar spine disc degenerative change.  No acute osseous abnormality.  IMPRESSION: No acute intra-abdominal or pelvic pathology.  Diverticulosis without CT evidence for diverticulitis.  Original Report Authenticated By: Harrel Lemon, M.D.   EKG:  Rhythm: nsr Rate: 93 Axis: Left Intervals: lbbb ST segments: appropriate discordance   1. Hyponatremia   2. Vomiting   3. Low back pain       MDM  80yF with vomiting and noted to have significant hypoNa. No acute AMS or seizure. Admit for further eval.        Raeford Razor, MD 12/19/11 [phone]

## 2011-12-17 NOTE — ED Notes (Signed)
Maria-pts family.  [phone]

## 2011-12-17 NOTE — Progress Notes (Signed)
Pt temp 100.4 BP-165/65, MD made aware. MD will come to floor to assess patient and write orders. Will continue to monitor.

## 2011-12-18 ENCOUNTER — Inpatient Hospital Stay (HOSPITAL_COMMUNITY): Payer: Medicare Other

## 2011-12-18 DIAGNOSIS — E860 Dehydration: Secondary | ICD-10-CM | POA: Diagnosis not present

## 2011-12-18 DIAGNOSIS — I10 Essential (primary) hypertension: Secondary | ICD-10-CM

## 2011-12-18 DIAGNOSIS — M545 Low back pain: Secondary | ICD-10-CM | POA: Diagnosis not present

## 2011-12-18 DIAGNOSIS — N3000 Acute cystitis without hematuria: Secondary | ICD-10-CM

## 2011-12-18 DIAGNOSIS — E871 Hypo-osmolality and hyponatremia: Secondary | ICD-10-CM | POA: Diagnosis not present

## 2011-12-18 LAB — BASIC METABOLIC PANEL
CO2: 22 mEq/L (ref 19–32)
CO2: 22 mEq/L (ref 19–32)
Calcium: 8.1 mg/dL — ABNORMAL LOW (ref 8.4–10.5)
Calcium: 8.2 mg/dL — ABNORMAL LOW (ref 8.4–10.5)
Calcium: 8.5 mg/dL (ref 8.4–10.5)
Chloride: 88 mEq/L — ABNORMAL LOW (ref 96–112)
Chloride: 89 mEq/L — ABNORMAL LOW (ref 96–112)
Chloride: 88 mEq/L — ABNORMAL LOW (ref 96–112)
Creatinine, Ser: 0.61 mg/dL (ref 0.50–1.10)
Creatinine, Ser: 0.6 mg/dL (ref 0.50–1.10)
GFR calc Af Amer: >90 mL/min (ref >90)
GFR calc Af Amer: >90 mL/min (ref >90)
GFR calc Af Amer: >90 mL/min (ref >90)
GFR calc Af Amer: >90 mL/min (ref >90)
GFR calc non Af Amer: 85 mL/min — ABNORMAL LOW (ref >90)
GFR calc non Af Amer: 84 mL/min — ABNORMAL LOW (ref >90)
Glucose, Bld: 104 mg/dL — ABNORMAL HIGH (ref 70–99)
Potassium: 3.4 mEq/L — ABNORMAL LOW (ref 3.5–5.1)
Potassium: 3.6 mEq/L (ref 3.5–5.1)
Sodium: 120 mEq/L — ABNORMAL LOW (ref 135–145)
Sodium: 119 mEq/L — CL (ref 135–145)
Sodium: 126 mEq/L — ABNORMAL LOW (ref 135–145)

## 2011-12-18 MED ORDER — ZOLPIDEM TARTRATE 5 MG PO TABS
5.0000 mg | ORAL_TABLET | Freq: Every evening | ORAL | Status: DC | PRN
  Administered 2011-12-18: 5 mg via ORAL
  Filled 2011-12-18: qty 1

## 2011-12-18 NOTE — Progress Notes (Signed)
[facility] PROGRESS NOTE  SHAARON GOLLIDAY WUJ:[id number] DOB: 1931/06/10 DOA: 12/17/2011 PCP: Ginette Otto, MD, MD  Principal Problem:  *Hyponatremia Active Problems:  Dehydration  UTI (lower urinary tract infection)  HTN (hypertension)  Dementia   Assessment/Plan: 1. Severe hyponatremia: Remains asymptomatic. No further improvement since the rapid increase yesterday after saline bolus given in the emergency department. Urine studies and osmolality were not obtained before saline bolus yesterday. Still think most likely secondary to ACE inhibitor, hydrochlorothiazide combined with chronic poor diet and acute nausea/vomiting and poor oral intake. IV fluids, serial basic metabolic panel. TSH and cortisol within normal limits. If fails to improve further would consider nephrology consultation May 17. No history of smoking. 2. Dehydration: Plan as above. 3. UTI: Finishes outpatient Keflex 5/17. 4. Left lower back pain: Presents 6 weeks. Intermittent and waxing/waning in nature. Abdomen and pelvic CT was performed with contrast and unremarkable except for lumbar degenerative disc disease. Patient and family are concerned about kidney stone which thus far has not been excluded. Has not improved with treatment of urinary tract infection. Family wants to pursue noncontrast CT to exclude kidney stone. If this is negative her pain would most presumably be related to her degenerative disc disease.  5. Hypertension: Monitor off lisinopril and hydrochlorothiazide. May need an alternate agent. 6. Mild dementia: Appears stable.  Code Status: DO NOT RESUSCITATE  Family Communication: Discussed with son's girlfriend bedside with the patient's permission. All questions answered to their apparent satisfaction.  Disposition Plan: Home when improved.  Brendia Sacks, MD  [facility] Pager [phone]  If 8PM-8AM, please contact night-coverage [email or web address] Password  [email or web address] 12/18/2011, 12:21 PM   LOS: 1 day   Consultants:    Procedures:    HPI/Subjective: Afebrile, vital signs stable.Feels okay except for her left low back pain.   Objective: Filed Vitals:   12/17/11 2015 12/17/11 2128 12/18/11 0229 12/18/11 0616  BP:  152/74 124/53 130/60  Pulse: 107 100 94 90  Temp:  98.4 F (36.9 C) 98.5 F (36.9 C) 98 F (36.7 C)  TempSrc:  Oral Oral Oral  Resp:  18 18 16   Height:      Weight:    60.918 kg (134 lb 4.8 oz)  SpO2:  98% 96% 97%    Intake/Output Summary (Last 24 hours) at 12/18/11 1221 Last data filed at 12/18/11 1100  Gross per 24 hour  Intake   1205 ml  Output   2025 ml  Net   -820 ml    Exam:   General:  Appears calm and comfortable. Sitting in chair.   Cardiovascular: Regular rate and rhythm. No murmur, rub, gallop. No lower extremity edema.   Respiratory: Clear to auscultation bilaterally. No wheezes, rales, rhonchi. Normal respiratory effort.   Back: Setup without difficulty. No pain with palpation of left costovertebral angle.  Data Reviewed: Basic Metabolic Panel:  Lab 12/18/11 8657 12/18/11 0555 12/17/11 2322 12/17/11 1754 12/17/11 1131  NA 119* 120* 118* 120* 114*  K 3.6 3.5 -- -- --  CL 88* 89* 88* 86* 78*  CO2 22 22 22 25 25   GLUCOSE 106* 99 107* 115* 123*  BUN 7 8 7 7 9   CREATININE 0.60 0.61 0.61 0.65 0.64  CALCIUM 8.2* 8.1* 8.3* 8.6 8.8  MG -- -- -- -- --  PHOS -- -- -- -- --   CBC:  Lab 12/17/11 1754 12/17/11 1131  WBC 12.2* 8.5  NEUTROABS -- --  HGB 10.8* 11.2*  HCT 29.9* 30.6*  MCV 84.0 83.4  PLT 325 314   Studies:  Scheduled Meds:   . aspirin EC  81 mg Oral Daily  . atorvastatin  10 mg Oral q1800  . cephALEXin  500 mg Oral TID  . enoxaparin  40 mg Subcutaneous QHS  . sodium chloride  3 mL Intravenous Q12H  . zolpidem  5 mg Oral Once   Continuous Infusions:   . sodium chloride 125 mL/hr (12/18/11 1004)  . DISCONTD: sodium chloride    . DISCONTD: sodium chloride 75 mL/hr at  12/17/11 1904

## 2011-12-18 NOTE — Progress Notes (Signed)
UR Completed. Idabelle Mcpeters, RN, Nurse Case Manager [phone]     

## 2011-12-19 DIAGNOSIS — N3000 Acute cystitis without hematuria: Secondary | ICD-10-CM | POA: Diagnosis not present

## 2011-12-19 DIAGNOSIS — M545 Low back pain: Secondary | ICD-10-CM | POA: Diagnosis present

## 2011-12-19 DIAGNOSIS — E860 Dehydration: Secondary | ICD-10-CM | POA: Diagnosis not present

## 2011-12-19 DIAGNOSIS — E871 Hypo-osmolality and hyponatremia: Secondary | ICD-10-CM

## 2011-12-19 DIAGNOSIS — I10 Essential (primary) hypertension: Secondary | ICD-10-CM

## 2011-12-19 LAB — BASIC METABOLIC PANEL
Chloride: 98 mEq/L (ref 96–112)
GFR calc Af Amer: >90 mL/min (ref >90)
GFR calc non Af Amer: 86 mL/min — ABNORMAL LOW (ref >90)
Potassium: 3.5 mEq/L (ref 3.5–5.1)
Sodium: 131 mEq/L — ABNORMAL LOW (ref 135–145)

## 2011-12-19 NOTE — Discharge Summary (Signed)
Physician Discharge Summary  Gabrielle Hoffman ZOX:[id number] DOB: [birth year] DOA: 12/17/2011  PCP: Ginette Otto, MD, MD  Admit date: 12/17/2011 Discharge date: 12/19/2011  Recommendations for Outpatient Follow-up:  1. Followup serum sodium next week. 2. Consider resuming antihypertensives as indicated. 3. Consider outpatient evaluation with subspecialist for low back pain versus trial of physical therapy. 4. Encourage proper nutrition and oral intake.  Discharge Diagnoses:  1. Severe hyponatremia, multifactorial 2. Dehydration, resolved 3. UTI 4. Subacute/chronic low back pain 5. Hypertension, stable 6. Mild dementia  Discharge Condition: Improved Disposition: Home  Diet recommendation: Regular diet  History of present illness:  78 year old woman presented with nausea, vomiting, poor oral intake and severe hyponatremia.  Hospital Course:  [name] was admitted to the medical floor for hyponatremia. Her history and laboratory studies were most suggestive of multifactorial hyponatremia. She responded well to normal saline IV fluids and has remained asymptomatic. Nausea and vomiting have resolved and were quite possibly related to recent urinary tract infection. Appetite somewhat improved. Currently it hypertensive as are on hold but this could probably be resumed in the outpatient setting. She also made mention of low left back pain which been present for 6 weeks and was further investigated as below. She is now stable for discharge. Disposition and followup recommendations discussed with primary care physician Dr. Pete Glatter by telephone today. 1. Severe hyponatremia: Nearly resolved with IV fluids. Urine studies and osmolality were not obtained before saline bolus. Thought to be secondary to ACE inhibitor, hydrochlorothiazide combined with chronic poor diet and acute nausea/vomiting and poor oral intake. TSH and cortisol within normal limits. Continue to hold hydrochlorothiazide  and ACE inhibitor on discharge. Encourage oral intake. Fluid restriction. Followup with primary care physician next week.  2. Dehydration: Plan as above.  3. UTI: Finishes outpatient Keflex 5/17.  4. Left lower back pain: Present 6 weeks. Intermittent and waxing/waning in nature. Abdomen and pelvic CT was performed with contrast and unremarkable except for lumbar degenerative disc disease. Patient and family were concerned about kidney stone as the pain has not improved with treatment of urinary tract infection. Noncontrast CT was negative for calculus. Pain presumably be related to her degenerative disc disease.    5. Hypertension: Monitor off lisinopril and hydrochlorothiazide. Well controlled without pharmacologic therapy.  6. Mild dementia: Appears stable.  Consultants:  None  Procedures:  None  Discharge Instructions  Discharge Orders    Future Orders Please Complete By Expires   Diet general      Activity as tolerated - No restrictions      Discharge instructions      Comments:   Be sure to eat regular meals and maintain nutrition. Limit fluid intake to 2 L per day.     Medication List  As of 12/19/2011 11:09 AM   STOP taking these medications         hydrochlorothiazide 25 MG tablet      lisinopril 20 MG tablet         TAKE these medications         acetaminophen 325 MG tablet   Commonly known as: TYLENOL   Take 650 mg by mouth every 6 (six) hours as needed. Pain/fever.      aspirin EC 81 MG tablet   Take 81 mg by mouth daily.      atorvastatin 10 MG tablet   Commonly known as: LIPITOR   Take 10 mg by mouth daily.      calcium-vitamin D 500-200 MG-UNIT per tablet  Commonly known as: OSCAL WITH D   Take 1 tablet by mouth daily.      cephALEXin 500 MG capsule   Commonly known as: KEFLEX   Take 500 mg by mouth 3 (three) times daily. 7 days. Patient started on 12/12/11.      ibuprofen 200 MG tablet   Commonly known as: ADVIL,MOTRIN   Take 400 mg by mouth  every 6 (six) hours as needed. Fever/pain.      ondansetron 4 MG tablet   Commonly known as: ZOFRAN   Take 4 mg by mouth every 8 (eight) hours as needed. Nausea/vomiting.           Follow-up Information    Follow up with Ginette Otto, MD. (His office will contact you today to schedule appointment next week.)    Contact information:   [address] [place] [address] [place] [address] [phone]          The results of significant diagnostics from this hospitalization (including imaging, microbiology, ancillary and laboratory) are listed below for reference.    Significant Diagnostic Studies: Ct Abdomen Pelvis Wo Contrast  12/18/2011  *RADIOLOGY REPORT*  Clinical Data: Lower back pain x2 weeks, evaluate for nephrolithiasis or obstructing stone.  CT ABDOMEN AND PELVIS WITHOUT CONTRAST  Technique:  Multidetector CT imaging of the abdomen and pelvis was performed following the standard protocol without intravenous contrast.  Comparison: [facility] CT abdomen pelvis dated 12/15/2011  Findings: Calcified granulomata in the right middle lobe.  Unenhanced liver, spleen, pancreas, and adrenal glands are within normal limits.  Gallbladder is unremarkable.  No intrahepatic or extrahepatic ductal dilatation.  Right lower pole cyst, better evaluated on recent enhanced CT.  No renal calculi or hydronephrosis.  No evidence of bowel obstruction. Appendix is not discretely visualized and may be surgically absent.  Extensive colonic diverticulosis, without associated inflammatory changes.  Atherosclerotic calcifications of the abdominal aorta and branch vessels.  No abdominopelvic ascites.  No suspicious abdominopelvic lymphadenopathy.  Status post hysterectomy.  No adnexal masses.  No ureteral or bladder calculi.  Degenerative changes of the visualized thoracolumbar spine, most prominent at L3-4 and L5-S1.  Lumbar dextroscoliosis.  IMPRESSION: No renal, ureteral, or bladder  calculi.  No hydronephrosis.  No CT findings to account for the patient's lower back pain.  No interval change from recent enhanced CT.  Original Report Authenticated By: Charline Bills, M.D.   Labs: Basic Metabolic Panel:  Lab 12/19/11 9147 12/18/11 1707 12/18/11 1120 12/18/11 0555 12/17/11 2322  NA 131* 126* 119* 120* 118*  K 3.5 3.6 -- -- --  CL 98 95* 88* 89* 88*  CO2 24 22 22 22 22   GLUCOSE 98 104* 106* 99 107*  BUN 7 8 7 8 7   CREATININE 0.59 0.63 0.60 0.61 0.61  CALCIUM 8.4 8.5 8.2* 8.1* 8.3*  MG -- -- -- -- --  PHOS -- -- -- -- --   CBC:  Lab 12/17/11 1754 12/17/11 1131  WBC 12.2* 8.5  NEUTROABS -- --  HGB 10.8* 11.2*  HCT 29.9* 30.6*  MCV 84.0 83.4  PLT 325 314   Principal Problem:  *Hyponatremia Active Problems:  Dehydration  UTI (lower urinary tract infection)  HTN (hypertension)  Dementia  Low back pain   Time coordinating discharge: 25 minutes  Signed:  Brendia Sacks, MD [facility] 12/19/2011, 11:09 AM

## 2011-12-19 NOTE — Progress Notes (Signed)
[facility] PROGRESS NOTE  Gabrielle Hoffman ZOX:[id number] DOB: 09-11-1930 DOA: 12/17/2011 PCP: Ginette Otto, MD, MD  Assessment/Plan: 1. Severe hyponatremia: Nearly resolved with IV fluids. Urine studies and osmolality were not obtained before saline bolus yesterday. Thought to be secondary to ACE inhibitor, hydrochlorothiazide combined with chronic poor diet and acute nausea/vomiting and poor oral intake. TSH and cortisol within normal limits. Continue to hold hydrochlorothiazide and ACE inhibitor on discharge. Encourage oral intake. Fluid restriction. Followup with primary care physician next week. 2. Dehydration: Plan as above. 3. UTI: Finishes outpatient Keflex 5/17. 4. Left lower back pain: Present 6 weeks. Intermittent and waxing/waning in nature. Abdomen and pelvic CT was performed with contrast and unremarkable except for lumbar degenerative disc disease. Patient and family were concerned about kidney stone as the pain has not improved with treatment of urinary tract infection. Noncontrast CT was negative for calculus. Pain presumably be related to her degenerative disc disease.  5. Hypertension: Monitor off lisinopril and hydrochlorothiazide. Well controlled without pharmacologic therapy. 6. Mild dementia: Appears stable.  Code Status: DO NOT RESUSCITATE  Family Communication: Discussed with son's girlfriend by telephone. All questions answered to their apparent satisfaction.  Disposition Plan: Home when improved.  Discussed by telephone today with primary care physician Dr. Pete Glatter. His office will contact the patient for followup next week for repeat basic metabolic panel to followup sodium.  Brendia Sacks, MD  [facility] Pager [phone]  If 8PM-8AM, please contact night-coverage at [email or web address] Password [email or web address] 12/19/2011, 10:39 AM  LOS: 2 days   Consultants:  None  Procedures:  None  HPI/Subjective: Afebrile, vital signs stable. Sodium up to 131.  Chloride now within normal limits. CT of abdomen and pelvis was unremarkable.  Objective: Filed Vitals:   12/18/11 0229 12/18/11 0616 12/18/11 2100 12/19/11 0554  BP: 124/53 130/60 110/41 118/43  Pulse: 94 90 82 83  Temp: 98.5 F (36.9 C) 98 F (36.7 C) 98.6 F (37 C) 97.8 F (36.6 C)  TempSrc: Oral Oral Oral Oral  Resp: 18 16 20 20   Height:      Weight:  60.918 kg (134 lb 4.8 oz)  61.8 kg (136 lb 3.9 oz)  SpO2: 96% 97% 97% 98%    Intake/Output Summary (Last 24 hours) at 12/19/11 1039 Last data filed at 12/19/11 0957  Gross per 24 hour  Intake 2409.5 ml  Output   3001 ml  Net -591.5 ml    Exam:   General:  Appears calm and comfortable. Sitting in chair.   Cardiovascular: Regular rate and rhythm. No murmur, rub, gallop. No lower extremity edema.   Respiratory: Clear to auscultation bilaterally. No wheezes, rales, rhonchi. Normal respiratory effort.   Back: Setup without difficulty. No pain with palpation of left costovertebral angle.  Data Reviewed: Basic Metabolic Panel:  Lab 12/19/11 8295 12/18/11 1707 12/18/11 1120 12/18/11 0555 12/17/11 2322  NA 131* 126* 119* 120* 118*  K 3.5 3.6 -- -- --  CL 98 95* 88* 89* 88*  CO2 24 22 22 22 22   GLUCOSE 98 104* 106* 99 107*  BUN 7 8 7 8 7   CREATININE 0.59 0.63 0.60 0.61 0.61  CALCIUM 8.4 8.5 8.2* 8.1* 8.3*  MG -- -- -- -- --  PHOS -- -- -- -- --   CBC:  Lab 12/17/11 1754 12/17/11 1131  WBC 12.2* 8.5  NEUTROABS -- --  HGB 10.8* 11.2*  HCT 29.9* 30.6*  MCV 84.0 83.4  PLT 325 314   Studies:  Scheduled Meds:    .  aspirin EC  81 mg Oral Daily  . atorvastatin  10 mg Oral q1800  . cephALEXin  500 mg Oral TID  . enoxaparin  40 mg Subcutaneous QHS  . sodium chloride  3 mL Intravenous Q12H   Continuous Infusions:    . sodium chloride 125 mL/hr at 12/19/11 0800   Principal Problem:  *Hyponatremia Active Problems:  Dehydration  UTI (lower urinary tract infection)  HTN (hypertension)  Dementia  Low back  pain

## 2011-12-19 NOTE — Progress Notes (Signed)
Discussed discharge instructions with pt including how and when to call the dr, follow up appt, diet, and medications to take at home. Pt verbalized understanding and denied any questions. Pt awaiting transportation from friend to arrive. Will continue to monitor pt closely until her transportation arrives.  Juliane Lack, RN

## 2011-12-20 DIAGNOSIS — E871 Hypo-osmolality and hyponatremia: Secondary | ICD-10-CM | POA: Diagnosis not present

## 2011-12-20 DIAGNOSIS — R609 Edema, unspecified: Secondary | ICD-10-CM | POA: Diagnosis not present

## 2011-12-23 ENCOUNTER — Other Ambulatory Visit: Payer: Self-pay | Admitting: Geriatric Medicine

## 2011-12-23 DIAGNOSIS — M545 Low back pain, unspecified: Secondary | ICD-10-CM

## 2011-12-23 DIAGNOSIS — N390 Urinary tract infection, site not specified: Secondary | ICD-10-CM | POA: Diagnosis not present

## 2011-12-23 DIAGNOSIS — Z79899 Other long term (current) drug therapy: Secondary | ICD-10-CM | POA: Diagnosis not present

## 2011-12-23 DIAGNOSIS — R05 Cough: Secondary | ICD-10-CM | POA: Diagnosis not present

## 2011-12-23 DIAGNOSIS — M549 Dorsalgia, unspecified: Secondary | ICD-10-CM | POA: Diagnosis not present

## 2011-12-23 DIAGNOSIS — R609 Edema, unspecified: Secondary | ICD-10-CM | POA: Diagnosis not present

## 2011-12-23 DIAGNOSIS — I10 Essential (primary) hypertension: Secondary | ICD-10-CM | POA: Diagnosis not present

## 2011-12-27 ENCOUNTER — Ambulatory Visit
Admission: RE | Admit: 2011-12-27 | Discharge: 2011-12-27 | Disposition: A | Discharge status: Home / Self Care | Payer: Medicare Other | Source: Ambulatory Visit | Attending: Geriatric Medicine | Admitting: Geriatric Medicine

## 2011-12-27 DIAGNOSIS — M545 Low back pain: Secondary | ICD-10-CM

## 2011-12-27 DIAGNOSIS — M5126 Other intervertebral disc displacement, lumbar region: Secondary | ICD-10-CM | POA: Diagnosis not present

## 2011-12-27 DIAGNOSIS — M47817 Spondylosis without myelopathy or radiculopathy, lumbosacral region: Secondary | ICD-10-CM | POA: Diagnosis not present

## 2011-12-27 DIAGNOSIS — M5137 Other intervertebral disc degeneration, lumbosacral region: Secondary | ICD-10-CM | POA: Diagnosis not present

## 2012-01-07 DIAGNOSIS — Z79899 Other long term (current) drug therapy: Secondary | ICD-10-CM | POA: Diagnosis not present

## 2012-01-07 DIAGNOSIS — I10 Essential (primary) hypertension: Secondary | ICD-10-CM | POA: Diagnosis not present

## 2012-01-07 DIAGNOSIS — R112 Nausea with vomiting, unspecified: Secondary | ICD-10-CM | POA: Diagnosis not present

## 2012-01-14 DIAGNOSIS — R112 Nausea with vomiting, unspecified: Secondary | ICD-10-CM | POA: Diagnosis not present

## 2012-01-18 DIAGNOSIS — N390 Urinary tract infection, site not specified: Secondary | ICD-10-CM | POA: Diagnosis not present

## 2012-01-18 DIAGNOSIS — R05 Cough: Secondary | ICD-10-CM | POA: Diagnosis not present

## 2012-01-18 DIAGNOSIS — I10 Essential (primary) hypertension: Secondary | ICD-10-CM | POA: Diagnosis not present

## 2012-01-18 DIAGNOSIS — Z79899 Other long term (current) drug therapy: Secondary | ICD-10-CM | POA: Diagnosis not present

## 2012-01-19 DIAGNOSIS — D509 Iron deficiency anemia, unspecified: Secondary | ICD-10-CM | POA: Diagnosis not present

## 2012-01-19 DIAGNOSIS — I10 Essential (primary) hypertension: Secondary | ICD-10-CM | POA: Diagnosis not present

## 2012-01-19 DIAGNOSIS — F411 Generalized anxiety disorder: Secondary | ICD-10-CM | POA: Diagnosis not present

## 2012-01-19 DIAGNOSIS — R634 Abnormal weight loss: Secondary | ICD-10-CM | POA: Diagnosis not present

## 2012-01-29 ENCOUNTER — Other Ambulatory Visit: Payer: Self-pay | Admitting: Geriatric Medicine

## 2012-01-29 DIAGNOSIS — Z1231 Encounter for screening mammogram for malignant neoplasm of breast: Secondary | ICD-10-CM

## 2012-02-02 ENCOUNTER — Other Ambulatory Visit: Payer: Self-pay | Admitting: Internal Medicine

## 2012-02-09 ENCOUNTER — Ambulatory Visit: Payer: Medicare Other

## 2012-02-12 DIAGNOSIS — R5381 Other malaise: Secondary | ICD-10-CM | POA: Diagnosis not present

## 2012-02-12 DIAGNOSIS — R5383 Other fatigue: Secondary | ICD-10-CM | POA: Diagnosis not present

## 2012-02-16 ENCOUNTER — Encounter (HOSPITAL_COMMUNITY): Payer: Self-pay | Admitting: Adult Health

## 2012-02-16 ENCOUNTER — Emergency Department (HOSPITAL_COMMUNITY)
Admission: EM | Admit: 2012-02-16 | Discharge: 2012-02-17 | Disposition: A | Discharge status: Home / Self Care | Payer: Medicare Other | Attending: Emergency Medicine | Admitting: Emergency Medicine

## 2012-02-16 DIAGNOSIS — N390 Urinary tract infection, site not specified: Secondary | ICD-10-CM | POA: Diagnosis not present

## 2012-02-16 DIAGNOSIS — R0602 Shortness of breath: Secondary | ICD-10-CM | POA: Diagnosis not present

## 2012-02-16 DIAGNOSIS — R059 Cough, unspecified: Secondary | ICD-10-CM | POA: Insufficient documentation

## 2012-02-16 DIAGNOSIS — R4182 Altered mental status, unspecified: Secondary | ICD-10-CM | POA: Diagnosis not present

## 2012-02-16 DIAGNOSIS — R6889 Other general symptoms and signs: Secondary | ICD-10-CM | POA: Diagnosis not present

## 2012-02-16 DIAGNOSIS — R918 Other nonspecific abnormal finding of lung field: Secondary | ICD-10-CM | POA: Diagnosis not present

## 2012-02-16 DIAGNOSIS — R05 Cough: Secondary | ICD-10-CM | POA: Insufficient documentation

## 2012-02-16 DIAGNOSIS — I10 Essential (primary) hypertension: Secondary | ICD-10-CM | POA: Diagnosis not present

## 2012-02-16 DIAGNOSIS — R5383 Other fatigue: Secondary | ICD-10-CM | POA: Diagnosis not present

## 2012-02-16 DIAGNOSIS — I499 Cardiac arrhythmia, unspecified: Secondary | ICD-10-CM | POA: Diagnosis not present

## 2012-02-16 DIAGNOSIS — Z79899 Other long term (current) drug therapy: Secondary | ICD-10-CM | POA: Insufficient documentation

## 2012-02-16 DIAGNOSIS — R5381 Other malaise: Secondary | ICD-10-CM | POA: Diagnosis not present

## 2012-02-16 DIAGNOSIS — I119 Hypertensive heart disease without heart failure: Secondary | ICD-10-CM | POA: Diagnosis not present

## 2012-02-16 NOTE — ED Notes (Signed)
Reports not feeling well today, BP of 210/100, and HR 120-150. Denies nausea, headache and dizzyness. EKG shows LBB. Denies weakness and numbness, denies pain. Takes lisinopril for BP.

## 2012-02-17 ENCOUNTER — Encounter (HOSPITAL_COMMUNITY): Payer: Self-pay | Admitting: Radiology

## 2012-02-17 ENCOUNTER — Emergency Department (HOSPITAL_COMMUNITY): Payer: Medicare Other

## 2012-02-17 DIAGNOSIS — R918 Other nonspecific abnormal finding of lung field: Secondary | ICD-10-CM | POA: Diagnosis not present

## 2012-02-17 DIAGNOSIS — R0602 Shortness of breath: Secondary | ICD-10-CM | POA: Diagnosis not present

## 2012-02-17 DIAGNOSIS — R4182 Altered mental status, unspecified: Secondary | ICD-10-CM | POA: Diagnosis not present

## 2012-02-17 DIAGNOSIS — I10 Essential (primary) hypertension: Secondary | ICD-10-CM | POA: Diagnosis not present

## 2012-02-17 LAB — COMPREHENSIVE METABOLIC PANEL
ALT: 12 U/L (ref 0–35)
AST: 16 U/L (ref 0–37)
Albumin: 4 g/dL (ref 3.5–5.2)
Alkaline Phosphatase: 72 U/L (ref 39–117)
BUN: 8 mg/dL (ref 6–23)
Chloride: 100 mEq/L (ref 96–112)
Potassium: 3.6 mEq/L (ref 3.5–5.1)
Sodium: 137 mEq/L (ref 135–145)
Total Bilirubin: 0.4 mg/dL (ref 0.3–1.2)
Total Protein: 7.2 g/dL (ref 6.0–8.3)

## 2012-02-17 LAB — URINALYSIS, ROUTINE W REFLEX MICROSCOPIC
Nitrite: POSITIVE — AB
Specific Gravity, Urine: 1.005 (ref 1.005–1.030)
Urobilinogen, UA: 0.2 mg/dL (ref 0.0–1.0)

## 2012-02-17 LAB — LIPASE, BLOOD: Lipase: 34 U/L (ref 11–59)

## 2012-02-17 LAB — TROPONIN I: Troponin I: <0.3 ng/mL (ref <0.30)

## 2012-02-17 LAB — CBC WITH DIFFERENTIAL/PLATELET
Basophils Absolute: 0 10*3/uL (ref 0.0–0.1)
Lymphocytes Relative: 16 % (ref 12–46)
Lymphs Abs: 1.3 10*3/uL (ref 0.7–4.0)
Neutrophils Relative %: 73 % (ref 43–77)
Platelets: 295 10*3/uL (ref 150–400)
RBC: 3.63 MIL/uL — ABNORMAL LOW (ref 3.87–5.11)
WBC: 8.1 10*3/uL (ref 4.0–10.5)

## 2012-02-17 LAB — URINE MICROSCOPIC-ADD ON

## 2012-02-17 MED ORDER — CEFTRIAXONE SODIUM 1 G IJ SOLR
1.0000 g | Freq: Once | INTRAMUSCULAR | Status: AC
  Administered 2012-02-17: 1 g via INTRAVENOUS
  Filled 2012-02-17: qty 10

## 2012-02-17 MED ORDER — CEPHALEXIN 500 MG PO CAPS: 500.0000 mg | ORAL_CAPSULE | Freq: Four times a day (QID) | ORAL | Status: AC

## 2012-02-17 MED ORDER — SODIUM CHLORIDE 0.9 % IV SOLN: INTRAVENOUS | Status: DC

## 2012-02-17 NOTE — ED Provider Notes (Signed)
History     CSN: [id number]  Arrival date & time 02/16/12  2308   First MD Initiated Contact with Patient 02/16/12 2310      Chief Complaint  Patient presents with  . Hypertension    (Consider location/radiation/quality/duration/timing/severity/associated sxs/prior treatment) Patient is a 78 y.o. female presenting with hypertension. The history is provided by the patient and a relative.  Hypertension This is a new problem. The current episode started 6 to 12 hours ago. The problem occurs constantly. The problem has not changed since onset.Pertinent negatives include no chest pain, no abdominal pain, no headaches and no shortness of breath.   patient just feels bad and unable described specifics. Denies chest pain shortness of breath abdominal pain nausea vomiting diarrhea fever rash has been older the cough no nasal congestion or sore throat. Denies dysuria. Patient did have a urinary tract infection back in may. Also had a problem with hyponatremia at that time.  Past Medical History  Diagnosis Date  . UTI (lower urinary tract infection)   . Hypertension   . Mitral valve prolapse   . Hyponatremia 12/17/2011  . DEMENTIA     very mild     Past Surgical History  Procedure Date  . Abdominal hysterectomy   . Tonsillectomy     Family History  Problem Relation Age of Onset  . Cancer Sister     History  Substance Use Topics  . Smoking status: Never Smoker   . Smokeless tobacco: Never Used  . Alcohol Use: No    OB History    Grav Para Term Preterm Abortions TAB SAB Ect Mult Living                  Review of Systems  Constitutional: Positive for fatigue. Negative for fever and chills.  HENT: Negative for congestion, sore throat and neck pain.   Eyes: Negative for redness and visual disturbance.  Respiratory: Positive for cough. Negative for shortness of breath.   Cardiovascular: Negative for chest pain.  Gastrointestinal: Negative for nausea, vomiting, abdominal pain  and diarrhea.  Genitourinary: Negative for dysuria and hematuria.  Musculoskeletal: Negative for back pain.  Skin: Negative for rash.  Neurological: Positive for weakness. Negative for headaches.  Hematological: Does not bruise/bleed easily.    Allergies  Review of patient's allergies indicates no known allergies.  Home Medications   Current Outpatient Rx  Name Route Sig Dispense Refill  . ACETAMINOPHEN 325 MG PO TABS Oral Take 650 mg by mouth every 6 (six) hours as needed. Pain/fever.    . ASPIRIN EC 81 MG PO TBEC Oral Take 81 mg by mouth daily.    . ATORVASTATIN CALCIUM 10 MG PO TABS Oral Take 10 mg by mouth daily.    Marland Kitchen LISINOPRIL 20 MG PO TABS Oral Take 20 mg by mouth daily.    Marland Kitchen ONDANSETRON HCL 4 MG PO TABS Oral Take 4 mg by mouth every 8 (eight) hours as needed. Nausea/vomiting.    Marland Kitchen SERTRALINE HCL 25 MG PO TABS Oral Take 25 mg by mouth daily.    . CEPHALEXIN 500 MG PO CAPS Oral Take 1 capsule (500 mg total) by mouth 4 (four) times daily. 28 capsule 0    BP 127/59  Pulse 110  Temp 97.7 F (36.5 C) (Oral)  Resp 16  SpO2 97%  Physical Exam  Nursing note and vitals reviewed. Constitutional: She is oriented to person, place, and time. She appears well-developed and well-nourished.  HENT:  Head: Normocephalic and  atraumatic.  Mouth/Throat: Oropharynx is clear and moist.  Eyes: Conjunctivae and EOM are normal. Pupils are equal, round, and reactive to light.  Neck: Normal range of motion. Neck supple.  Cardiovascular: Normal rate, regular rhythm and normal heart sounds.   No murmur heard. Pulmonary/Chest: Effort normal and breath sounds normal. She has no wheezes. She has no rales. She exhibits no tenderness.  Abdominal: Soft. Bowel sounds are normal. There is no tenderness.  Musculoskeletal: Normal range of motion. She exhibits no edema and no tenderness.  Lymphadenopathy:    She has no cervical adenopathy.  Neurological: She is alert and oriented to person, place, and  time. No cranial nerve deficit. She exhibits normal muscle tone. Coordination normal.  Skin: Skin is warm. No rash noted.    ED Course  Procedures (including critical care time)  Labs Reviewed  COMPREHENSIVE METABOLIC PANEL - Abnormal; Notable for the following:    Glucose, Bld 120 (*)     GFR calc non Af Amer 84 (*)     All other components within normal limits  URINALYSIS, ROUTINE W REFLEX MICROSCOPIC - Abnormal; Notable for the following:    APPearance CLOUDY (*)     Hgb urine dipstick TRACE (*)     Nitrite POSITIVE (*)     Leukocytes, UA MODERATE (*)     All other components within normal limits  CBC WITH DIFFERENTIAL - Abnormal; Notable for the following:    RBC 3.63 (*)     Hemoglobin 11.2 (*)     HCT 33.4 (*)     All other components within normal limits  URINE MICROSCOPIC-ADD ON - Abnormal; Notable for the following:    Bacteria, UA FEW (*)     All other components within normal limits  LIPASE, BLOOD  TROPONIN I  URINE CULTURE   Dg Chest 2 View  02/17/2012  *RADIOLOGY REPORT*  Clinical Data: Shortness of breath and high blood pressure.  CHEST - 2 VIEW  Comparison: 11/05/2010  Findings: Normal heart size and pulmonary vascularity. Emphysematous changes and scattered fibrosis in the lungs. Calcified granulomas in the right hilum.  No focal airspace consolidation in the lungs.  No blunting of costophrenic angles. No pneumothorax.  Degenerative changes in the thoracic spine.  No significant change since previous study.  IMPRESSION: No evidence of active pulmonary disease.  Emphysematous changes and calcified granulomas.  Original Report Authenticated By: Marlon Pel, M.D.   Ct Head Wo Contrast  02/17/2012  *RADIOLOGY REPORT*  Clinical Data: Altered mental status, high blood pressure and hypertension  CT HEAD WITHOUT CONTRAST  Technique:  Contiguous axial images were obtained from the base of the skull through the vertex without contrast.  Comparison: None.  Findings:  Cortical volume loss noted with proportional ventricular prominence.  Periventricular white matter hypodensity likely indicates small vessel ischemic change.  No acute hemorrhage, acute infarction, or mass lesion is identified.  No midline shift.  No extra-axial fluid collection.  Orbits and paranasal sinuses are unremarkable.  IMPRESSION: No acute intracranial finding.  Chronic findings as above.  Original Report Authenticated By: Harrel Lemon, M.D.   Results for orders placed during the hospital encounter of 02/16/12  LIPASE, BLOOD      Component Value Range   Lipase 34  11 - 59 U/L  COMPREHENSIVE METABOLIC PANEL      Component Value Range   Sodium 137  135 - 145 mEq/L   Potassium 3.6  3.5 - 5.1 mEq/L   Chloride 100  96 - 112 mEq/L   CO2 26  19 - 32 mEq/L   Glucose, Bld 120 (*) 70 - 99 mg/dL   BUN 8  6 - 23 mg/dL   Creatinine, Ser 3.08  0.50 - 1.10 mg/dL   Calcium 9.3  8.4 - 65.7 mg/dL   Total Protein 7.2  6.0 - 8.3 g/dL   Albumin 4.0  3.5 - 5.2 g/dL   AST 16  0 - 37 U/L   ALT 12  0 - 35 U/L   Alkaline Phosphatase 72  39 - 117 U/L   Total Bilirubin 0.4  0.3 - 1.2 mg/dL   GFR calc non Af Amer 84 (*) >90 mL/min   GFR calc Af Amer >90  >90 mL/min  URINALYSIS, ROUTINE W REFLEX MICROSCOPIC      Component Value Range   Color, Urine YELLOW  YELLOW   APPearance CLOUDY (*) CLEAR   Specific Gravity, Urine 1.005  1.005 - 1.030   pH 7.5  5.0 - 8.0   Glucose, UA NEGATIVE  NEGATIVE mg/dL   Hgb urine dipstick TRACE (*) NEGATIVE   Bilirubin Urine NEGATIVE  NEGATIVE   Ketones, ur NEGATIVE  NEGATIVE mg/dL   Protein, ur NEGATIVE  NEGATIVE mg/dL   Urobilinogen, UA 0.2  0.0 - 1.0 mg/dL   Nitrite POSITIVE (*) NEGATIVE   Leukocytes, UA MODERATE (*) NEGATIVE  CBC WITH DIFFERENTIAL      Component Value Range   WBC 8.1  4.0 - 10.5 K/uL   RBC 3.63 (*) 3.87 - 5.11 MIL/uL   Hemoglobin 11.2 (*) 12.0 - 15.0 g/dL   HCT 84.6 (*) 96.2 - 95.2 %   MCV 92.0  78.0 - 100.0 fL   MCH 30.9  26.0 - 34.0 pg     MCHC 33.5  30.0 - 36.0 g/dL   RDW 84.1  32.4 - 40.1 %   Platelets 295  150 - 400 K/uL   Neutrophils Relative 73  43 - 77 %   Neutro Abs 5.9  1.7 - 7.7 K/uL   Lymphocytes Relative 16  12 - 46 %   Lymphs Abs 1.3  0.7 - 4.0 K/uL   Monocytes Relative 9  3 - 12 %   Monocytes Absolute 0.7  0.1 - 1.0 K/uL   Eosinophils Relative 3  0 - 5 %   Eosinophils Absolute 0.2  0.0 - 0.7 K/uL   Basophils Relative 1  0 - 1 %   Basophils Absolute 0.0  0.0 - 0.1 K/uL  TROPONIN I      Component Value Range   Troponin I <0.30  <0.30 ng/mL  URINE MICROSCOPIC-ADD ON      Component Value Range   Squamous Epithelial / LPF RARE  RARE   WBC, UA 7-10  <3 WBC/hpf   RBC / HPF 0-2  <3 RBC/hpf   Bacteria, UA FEW (*) RARE    Date: 02/17/2012  Rate: 109  Rhythm: sinus tachycardia  QRS Axis: normal  Intervals: normal  ST/T Wave abnormalities: nonspecific ST/T changes  Conduction Disutrbances:left bundle branch block  Narrative Interpretation:   Old EKG Reviewed: unchanged No change in EKG compared to Dec 17 2011.   1. Urinary tract infection       MDM  Workup consistent with urinary tract infection urine culture was sent. Patient given 1 g of Rocephin IV piggyback in the emergency part will be continued on Keflex at home and followup with her record Dr. Marta Antu symptoms are related to the  urinary tract infection rest of the workup was negative. Head CT without significant findings chest x-ray without pneumonia or pneumothorax. Cardiac markers negative EKG with left bundle branch pattern unchanged from May. No leukocytosis hemoglobin and hematocrit actually improved from previous levels and may be hyponatremia the past has been corrected and is now 137.        Shelda Jakes, MD 02/17/12 [id number]

## 2012-02-19 LAB — URINE CULTURE: Colony Count: >=100000

## 2012-02-20 NOTE — ED Notes (Signed)
+  Urine. Patient treated with Keflex. Sensitive to same. Per protocol MD. °

## 2012-02-27 DIAGNOSIS — I10 Essential (primary) hypertension: Secondary | ICD-10-CM | POA: Diagnosis not present

## 2012-02-27 DIAGNOSIS — R112 Nausea with vomiting, unspecified: Secondary | ICD-10-CM | POA: Diagnosis not present

## 2012-02-27 DIAGNOSIS — R413 Other amnesia: Secondary | ICD-10-CM | POA: Diagnosis not present

## 2012-02-27 DIAGNOSIS — R634 Abnormal weight loss: Secondary | ICD-10-CM | POA: Diagnosis not present

## 2012-03-12 DIAGNOSIS — N390 Urinary tract infection, site not specified: Secondary | ICD-10-CM | POA: Diagnosis not present

## 2012-03-15 ENCOUNTER — Other Ambulatory Visit: Payer: Self-pay | Admitting: Gastroenterology

## 2012-03-15 DIAGNOSIS — R110 Nausea: Secondary | ICD-10-CM

## 2012-03-15 DIAGNOSIS — R634 Abnormal weight loss: Secondary | ICD-10-CM | POA: Diagnosis not present

## 2012-03-17 ENCOUNTER — Other Ambulatory Visit: Payer: Self-pay | Admitting: Gastroenterology

## 2012-03-17 DIAGNOSIS — E875 Hyperkalemia: Secondary | ICD-10-CM | POA: Diagnosis not present

## 2012-03-17 DIAGNOSIS — D1330 Benign neoplasm of unspecified part of small intestine: Secondary | ICD-10-CM | POA: Diagnosis not present

## 2012-03-17 DIAGNOSIS — K222 Esophageal obstruction: Secondary | ICD-10-CM | POA: Diagnosis not present

## 2012-03-17 DIAGNOSIS — R634 Abnormal weight loss: Secondary | ICD-10-CM | POA: Diagnosis not present

## 2012-03-17 DIAGNOSIS — R110 Nausea: Secondary | ICD-10-CM | POA: Diagnosis not present

## 2012-03-18 ENCOUNTER — Ambulatory Visit
Admission: RE | Admit: 2012-03-18 | Discharge: 2012-03-18 | Disposition: A | Discharge status: Home / Self Care | Payer: Medicare Other | Source: Ambulatory Visit | Attending: Gastroenterology | Admitting: Gastroenterology

## 2012-03-18 DIAGNOSIS — R110 Nausea: Secondary | ICD-10-CM | POA: Diagnosis not present

## 2012-03-18 DIAGNOSIS — R630 Anorexia: Secondary | ICD-10-CM | POA: Diagnosis not present

## 2012-03-18 DIAGNOSIS — N281 Cyst of kidney, acquired: Secondary | ICD-10-CM | POA: Diagnosis not present

## 2012-03-26 DIAGNOSIS — N390 Urinary tract infection, site not specified: Secondary | ICD-10-CM | POA: Diagnosis not present

## 2012-04-06 DIAGNOSIS — H3530 Unspecified macular degeneration: Secondary | ICD-10-CM | POA: Diagnosis not present

## 2012-04-20 DIAGNOSIS — R630 Anorexia: Secondary | ICD-10-CM | POA: Diagnosis not present

## 2012-04-20 DIAGNOSIS — R634 Abnormal weight loss: Secondary | ICD-10-CM | POA: Diagnosis not present

## 2012-04-20 DIAGNOSIS — R110 Nausea: Secondary | ICD-10-CM | POA: Diagnosis not present

## 2012-04-29 DIAGNOSIS — N390 Urinary tract infection, site not specified: Secondary | ICD-10-CM | POA: Diagnosis not present

## 2012-04-29 DIAGNOSIS — Z23 Encounter for immunization: Secondary | ICD-10-CM | POA: Diagnosis not present

## 2012-04-29 DIAGNOSIS — R634 Abnormal weight loss: Secondary | ICD-10-CM | POA: Diagnosis not present

## 2012-04-29 DIAGNOSIS — I10 Essential (primary) hypertension: Secondary | ICD-10-CM | POA: Diagnosis not present

## 2012-04-29 DIAGNOSIS — F329 Major depressive disorder, single episode, unspecified: Secondary | ICD-10-CM | POA: Diagnosis not present

## 2012-05-19 DIAGNOSIS — R634 Abnormal weight loss: Secondary | ICD-10-CM | POA: Diagnosis not present

## 2012-05-19 DIAGNOSIS — N390 Urinary tract infection, site not specified: Secondary | ICD-10-CM | POA: Diagnosis not present

## 2012-05-19 DIAGNOSIS — R3915 Urgency of urination: Secondary | ICD-10-CM | POA: Diagnosis not present

## 2012-05-19 DIAGNOSIS — I10 Essential (primary) hypertension: Secondary | ICD-10-CM | POA: Diagnosis not present

## 2012-05-19 DIAGNOSIS — F329 Major depressive disorder, single episode, unspecified: Secondary | ICD-10-CM | POA: Diagnosis not present

## 2012-06-09 DIAGNOSIS — E46 Unspecified protein-calorie malnutrition: Secondary | ICD-10-CM | POA: Diagnosis not present

## 2012-06-09 DIAGNOSIS — I10 Essential (primary) hypertension: Secondary | ICD-10-CM | POA: Diagnosis not present

## 2012-06-09 DIAGNOSIS — F329 Major depressive disorder, single episode, unspecified: Secondary | ICD-10-CM | POA: Diagnosis not present

## 2012-06-09 DIAGNOSIS — M549 Dorsalgia, unspecified: Secondary | ICD-10-CM | POA: Diagnosis not present

## 2012-07-21 DIAGNOSIS — R413 Other amnesia: Secondary | ICD-10-CM | POA: Diagnosis not present

## 2012-07-21 DIAGNOSIS — I10 Essential (primary) hypertension: Secondary | ICD-10-CM | POA: Diagnosis not present

## 2012-07-21 DIAGNOSIS — R634 Abnormal weight loss: Secondary | ICD-10-CM | POA: Diagnosis not present

## 2012-07-21 DIAGNOSIS — F329 Major depressive disorder, single episode, unspecified: Secondary | ICD-10-CM | POA: Diagnosis not present

## 2012-07-21 DIAGNOSIS — M542 Cervicalgia: Secondary | ICD-10-CM | POA: Diagnosis not present

## 2012-08-17 ENCOUNTER — Other Ambulatory Visit: Payer: Self-pay | Admitting: Geriatric Medicine

## 2012-08-17 DIAGNOSIS — F05 Delirium due to known physiological condition: Secondary | ICD-10-CM | POA: Diagnosis not present

## 2012-08-17 DIAGNOSIS — M79609 Pain in unspecified limb: Secondary | ICD-10-CM | POA: Diagnosis not present

## 2012-08-17 DIAGNOSIS — R269 Unspecified abnormalities of gait and mobility: Secondary | ICD-10-CM | POA: Diagnosis not present

## 2012-08-17 DIAGNOSIS — Z79899 Other long term (current) drug therapy: Secondary | ICD-10-CM | POA: Diagnosis not present

## 2012-08-20 ENCOUNTER — Ambulatory Visit
Admission: RE | Admit: 2012-08-20 | Discharge: 2012-08-20 | Disposition: A | Discharge status: Home / Self Care | Payer: Medicare Other | Source: Ambulatory Visit | Attending: Geriatric Medicine | Admitting: Geriatric Medicine

## 2012-08-20 DIAGNOSIS — R269 Unspecified abnormalities of gait and mobility: Secondary | ICD-10-CM

## 2012-08-20 DIAGNOSIS — S0990XA Unspecified injury of head, initial encounter: Secondary | ICD-10-CM | POA: Diagnosis not present

## 2012-08-20 MED ORDER — GADOBENATE DIMEGLUMINE 529 MG/ML IV SOLN
10.0000 mL | Freq: Once | INTRAVENOUS | Status: AC | PRN
  Administered 2012-08-20: 10 mL via INTRAVENOUS

## 2012-08-30 DIAGNOSIS — M79609 Pain in unspecified limb: Secondary | ICD-10-CM | POA: Diagnosis not present

## 2012-08-30 DIAGNOSIS — IMO0001 Reserved for inherently not codable concepts without codable children: Secondary | ICD-10-CM | POA: Diagnosis not present

## 2012-08-30 DIAGNOSIS — Z9181 History of falling: Secondary | ICD-10-CM | POA: Diagnosis not present

## 2012-08-30 DIAGNOSIS — R269 Unspecified abnormalities of gait and mobility: Secondary | ICD-10-CM | POA: Diagnosis not present

## 2012-09-01 DIAGNOSIS — Z9181 History of falling: Secondary | ICD-10-CM | POA: Diagnosis not present

## 2012-09-01 DIAGNOSIS — R269 Unspecified abnormalities of gait and mobility: Secondary | ICD-10-CM | POA: Diagnosis not present

## 2012-09-01 DIAGNOSIS — F05 Delirium due to known physiological condition: Secondary | ICD-10-CM | POA: Diagnosis not present

## 2012-09-01 DIAGNOSIS — R32 Unspecified urinary incontinence: Secondary | ICD-10-CM | POA: Diagnosis not present

## 2012-09-01 DIAGNOSIS — M79609 Pain in unspecified limb: Secondary | ICD-10-CM | POA: Diagnosis not present

## 2012-09-01 DIAGNOSIS — IMO0001 Reserved for inherently not codable concepts without codable children: Secondary | ICD-10-CM | POA: Diagnosis not present

## 2012-09-06 DIAGNOSIS — Z9181 History of falling: Secondary | ICD-10-CM | POA: Diagnosis not present

## 2012-09-06 DIAGNOSIS — R32 Unspecified urinary incontinence: Secondary | ICD-10-CM | POA: Diagnosis not present

## 2012-09-06 DIAGNOSIS — F05 Delirium due to known physiological condition: Secondary | ICD-10-CM | POA: Diagnosis not present

## 2012-09-06 DIAGNOSIS — M79609 Pain in unspecified limb: Secondary | ICD-10-CM | POA: Diagnosis not present

## 2012-09-06 DIAGNOSIS — R269 Unspecified abnormalities of gait and mobility: Secondary | ICD-10-CM | POA: Diagnosis not present

## 2012-09-06 DIAGNOSIS — IMO0001 Reserved for inherently not codable concepts without codable children: Secondary | ICD-10-CM | POA: Diagnosis not present

## 2012-09-09 DIAGNOSIS — M79609 Pain in unspecified limb: Secondary | ICD-10-CM | POA: Diagnosis not present

## 2012-09-09 DIAGNOSIS — R269 Unspecified abnormalities of gait and mobility: Secondary | ICD-10-CM | POA: Diagnosis not present

## 2012-09-09 DIAGNOSIS — IMO0001 Reserved for inherently not codable concepts without codable children: Secondary | ICD-10-CM | POA: Diagnosis not present

## 2012-09-09 DIAGNOSIS — F05 Delirium due to known physiological condition: Secondary | ICD-10-CM | POA: Diagnosis not present

## 2012-09-09 DIAGNOSIS — Z9181 History of falling: Secondary | ICD-10-CM | POA: Diagnosis not present

## 2012-09-09 DIAGNOSIS — R32 Unspecified urinary incontinence: Secondary | ICD-10-CM | POA: Diagnosis not present

## 2012-09-13 DIAGNOSIS — Z9181 History of falling: Secondary | ICD-10-CM | POA: Diagnosis not present

## 2012-09-13 DIAGNOSIS — M79609 Pain in unspecified limb: Secondary | ICD-10-CM | POA: Diagnosis not present

## 2012-09-13 DIAGNOSIS — F05 Delirium due to known physiological condition: Secondary | ICD-10-CM | POA: Diagnosis not present

## 2012-09-13 DIAGNOSIS — R32 Unspecified urinary incontinence: Secondary | ICD-10-CM | POA: Diagnosis not present

## 2012-09-13 DIAGNOSIS — IMO0001 Reserved for inherently not codable concepts without codable children: Secondary | ICD-10-CM | POA: Diagnosis not present

## 2012-09-13 DIAGNOSIS — R269 Unspecified abnormalities of gait and mobility: Secondary | ICD-10-CM | POA: Diagnosis not present

## 2012-09-20 DIAGNOSIS — R269 Unspecified abnormalities of gait and mobility: Secondary | ICD-10-CM | POA: Diagnosis not present

## 2012-09-20 DIAGNOSIS — IMO0001 Reserved for inherently not codable concepts without codable children: Secondary | ICD-10-CM | POA: Diagnosis not present

## 2012-09-20 DIAGNOSIS — F05 Delirium due to known physiological condition: Secondary | ICD-10-CM | POA: Diagnosis not present

## 2012-09-20 DIAGNOSIS — L723 Sebaceous cyst: Secondary | ICD-10-CM | POA: Diagnosis not present

## 2012-09-20 DIAGNOSIS — M79609 Pain in unspecified limb: Secondary | ICD-10-CM | POA: Diagnosis not present

## 2012-09-20 DIAGNOSIS — R32 Unspecified urinary incontinence: Secondary | ICD-10-CM | POA: Diagnosis not present

## 2012-09-20 DIAGNOSIS — Z9181 History of falling: Secondary | ICD-10-CM | POA: Diagnosis not present

## 2012-09-23 DIAGNOSIS — IMO0001 Reserved for inherently not codable concepts without codable children: Secondary | ICD-10-CM | POA: Diagnosis not present

## 2012-09-30 DIAGNOSIS — I10 Essential (primary) hypertension: Secondary | ICD-10-CM | POA: Diagnosis not present

## 2012-09-30 DIAGNOSIS — M549 Dorsalgia, unspecified: Secondary | ICD-10-CM | POA: Diagnosis not present

## 2012-11-02 DIAGNOSIS — Z01419 Encounter for gynecological examination (general) (routine) without abnormal findings: Secondary | ICD-10-CM | POA: Diagnosis not present

## 2012-11-02 DIAGNOSIS — Z124 Encounter for screening for malignant neoplasm of cervix: Secondary | ICD-10-CM | POA: Diagnosis not present

## 2012-11-30 ENCOUNTER — Ambulatory Visit
Admission: RE | Admit: 2012-11-30 | Discharge: 2012-11-30 | Disposition: A | Discharge status: Home / Self Care | Payer: Medicare Other | Source: Ambulatory Visit | Attending: Geriatric Medicine | Admitting: Geriatric Medicine

## 2012-11-30 DIAGNOSIS — Z1231 Encounter for screening mammogram for malignant neoplasm of breast: Secondary | ICD-10-CM

## 2013-01-28 DIAGNOSIS — I10 Essential (primary) hypertension: Secondary | ICD-10-CM | POA: Diagnosis not present

## 2013-01-28 DIAGNOSIS — F329 Major depressive disorder, single episode, unspecified: Secondary | ICD-10-CM | POA: Diagnosis not present

## 2013-01-28 DIAGNOSIS — R634 Abnormal weight loss: Secondary | ICD-10-CM | POA: Diagnosis not present

## 2013-01-28 DIAGNOSIS — Z79899 Other long term (current) drug therapy: Secondary | ICD-10-CM | POA: Diagnosis not present

## 2013-01-28 DIAGNOSIS — R413 Other amnesia: Secondary | ICD-10-CM | POA: Diagnosis not present

## 2013-02-09 DIAGNOSIS — M25559 Pain in unspecified hip: Secondary | ICD-10-CM | POA: Diagnosis not present

## 2013-02-09 DIAGNOSIS — D649 Anemia, unspecified: Secondary | ICD-10-CM | POA: Diagnosis not present

## 2013-02-09 DIAGNOSIS — T148XXA Other injury of unspecified body region, initial encounter: Secondary | ICD-10-CM | POA: Diagnosis not present

## 2013-02-16 DIAGNOSIS — D649 Anemia, unspecified: Secondary | ICD-10-CM | POA: Diagnosis not present

## 2013-02-21 DIAGNOSIS — D649 Anemia, unspecified: Secondary | ICD-10-CM | POA: Diagnosis not present

## 2013-02-21 DIAGNOSIS — F329 Major depressive disorder, single episode, unspecified: Secondary | ICD-10-CM | POA: Diagnosis not present

## 2013-02-21 DIAGNOSIS — T148XXA Other injury of unspecified body region, initial encounter: Secondary | ICD-10-CM | POA: Diagnosis not present

## 2013-03-12 DIAGNOSIS — I10 Essential (primary) hypertension: Secondary | ICD-10-CM | POA: Diagnosis not present

## 2013-03-12 DIAGNOSIS — Z87440 Personal history of urinary (tract) infections: Secondary | ICD-10-CM | POA: Diagnosis not present

## 2013-03-12 DIAGNOSIS — M25559 Pain in unspecified hip: Secondary | ICD-10-CM | POA: Diagnosis not present

## 2013-03-12 DIAGNOSIS — Z9181 History of falling: Secondary | ICD-10-CM | POA: Diagnosis not present

## 2013-03-12 DIAGNOSIS — R413 Other amnesia: Secondary | ICD-10-CM | POA: Diagnosis not present

## 2013-03-12 DIAGNOSIS — R262 Difficulty in walking, not elsewhere classified: Secondary | ICD-10-CM | POA: Diagnosis not present

## 2013-03-16 DIAGNOSIS — Z9181 History of falling: Secondary | ICD-10-CM | POA: Diagnosis not present

## 2013-03-16 DIAGNOSIS — R262 Difficulty in walking, not elsewhere classified: Secondary | ICD-10-CM | POA: Diagnosis not present

## 2013-03-16 DIAGNOSIS — I10 Essential (primary) hypertension: Secondary | ICD-10-CM | POA: Diagnosis not present

## 2013-03-16 DIAGNOSIS — M25559 Pain in unspecified hip: Secondary | ICD-10-CM | POA: Diagnosis not present

## 2013-03-16 DIAGNOSIS — Z87440 Personal history of urinary (tract) infections: Secondary | ICD-10-CM | POA: Diagnosis not present

## 2013-03-16 DIAGNOSIS — R413 Other amnesia: Secondary | ICD-10-CM | POA: Diagnosis not present

## 2013-03-18 DIAGNOSIS — M25559 Pain in unspecified hip: Secondary | ICD-10-CM | POA: Diagnosis not present

## 2013-03-18 DIAGNOSIS — Z9181 History of falling: Secondary | ICD-10-CM | POA: Diagnosis not present

## 2013-03-18 DIAGNOSIS — Z87440 Personal history of urinary (tract) infections: Secondary | ICD-10-CM | POA: Diagnosis not present

## 2013-03-18 DIAGNOSIS — R262 Difficulty in walking, not elsewhere classified: Secondary | ICD-10-CM | POA: Diagnosis not present

## 2013-03-18 DIAGNOSIS — R413 Other amnesia: Secondary | ICD-10-CM | POA: Diagnosis not present

## 2013-03-18 DIAGNOSIS — I10 Essential (primary) hypertension: Secondary | ICD-10-CM | POA: Diagnosis not present

## 2013-03-19 DIAGNOSIS — Z9181 History of falling: Secondary | ICD-10-CM | POA: Diagnosis not present

## 2013-03-19 DIAGNOSIS — M25559 Pain in unspecified hip: Secondary | ICD-10-CM | POA: Diagnosis not present

## 2013-03-19 DIAGNOSIS — R262 Difficulty in walking, not elsewhere classified: Secondary | ICD-10-CM | POA: Diagnosis not present

## 2013-03-19 DIAGNOSIS — R413 Other amnesia: Secondary | ICD-10-CM | POA: Diagnosis not present

## 2013-03-19 DIAGNOSIS — Z87440 Personal history of urinary (tract) infections: Secondary | ICD-10-CM | POA: Diagnosis not present

## 2013-03-19 DIAGNOSIS — I10 Essential (primary) hypertension: Secondary | ICD-10-CM | POA: Diagnosis not present

## 2013-03-21 DIAGNOSIS — Z87440 Personal history of urinary (tract) infections: Secondary | ICD-10-CM | POA: Diagnosis not present

## 2013-03-21 DIAGNOSIS — R262 Difficulty in walking, not elsewhere classified: Secondary | ICD-10-CM | POA: Diagnosis not present

## 2013-03-21 DIAGNOSIS — R413 Other amnesia: Secondary | ICD-10-CM | POA: Diagnosis not present

## 2013-03-21 DIAGNOSIS — I10 Essential (primary) hypertension: Secondary | ICD-10-CM | POA: Diagnosis not present

## 2013-03-21 DIAGNOSIS — M25559 Pain in unspecified hip: Secondary | ICD-10-CM | POA: Diagnosis not present

## 2013-03-21 DIAGNOSIS — Z9181 History of falling: Secondary | ICD-10-CM | POA: Diagnosis not present

## 2013-03-22 DIAGNOSIS — I10 Essential (primary) hypertension: Secondary | ICD-10-CM | POA: Diagnosis not present

## 2013-03-22 DIAGNOSIS — M25559 Pain in unspecified hip: Secondary | ICD-10-CM | POA: Diagnosis not present

## 2013-03-22 DIAGNOSIS — R413 Other amnesia: Secondary | ICD-10-CM | POA: Diagnosis not present

## 2013-03-22 DIAGNOSIS — R262 Difficulty in walking, not elsewhere classified: Secondary | ICD-10-CM | POA: Diagnosis not present

## 2013-03-22 DIAGNOSIS — Z87440 Personal history of urinary (tract) infections: Secondary | ICD-10-CM | POA: Diagnosis not present

## 2013-03-22 DIAGNOSIS — Z9181 History of falling: Secondary | ICD-10-CM | POA: Diagnosis not present

## 2013-03-24 DIAGNOSIS — Z9181 History of falling: Secondary | ICD-10-CM | POA: Diagnosis not present

## 2013-03-24 DIAGNOSIS — R262 Difficulty in walking, not elsewhere classified: Secondary | ICD-10-CM | POA: Diagnosis not present

## 2013-03-24 DIAGNOSIS — I10 Essential (primary) hypertension: Secondary | ICD-10-CM | POA: Diagnosis not present

## 2013-03-24 DIAGNOSIS — Z87440 Personal history of urinary (tract) infections: Secondary | ICD-10-CM | POA: Diagnosis not present

## 2013-03-24 DIAGNOSIS — R413 Other amnesia: Secondary | ICD-10-CM | POA: Diagnosis not present

## 2013-03-24 DIAGNOSIS — M25559 Pain in unspecified hip: Secondary | ICD-10-CM | POA: Diagnosis not present

## 2013-03-28 DIAGNOSIS — I10 Essential (primary) hypertension: Secondary | ICD-10-CM | POA: Diagnosis not present

## 2013-03-28 DIAGNOSIS — M25559 Pain in unspecified hip: Secondary | ICD-10-CM | POA: Diagnosis not present

## 2013-03-28 DIAGNOSIS — Z87440 Personal history of urinary (tract) infections: Secondary | ICD-10-CM | POA: Diagnosis not present

## 2013-03-28 DIAGNOSIS — R262 Difficulty in walking, not elsewhere classified: Secondary | ICD-10-CM | POA: Diagnosis not present

## 2013-03-28 DIAGNOSIS — Z9181 History of falling: Secondary | ICD-10-CM | POA: Diagnosis not present

## 2013-03-28 DIAGNOSIS — R413 Other amnesia: Secondary | ICD-10-CM | POA: Diagnosis not present

## 2013-03-29 DIAGNOSIS — R413 Other amnesia: Secondary | ICD-10-CM | POA: Diagnosis not present

## 2013-03-29 DIAGNOSIS — M25559 Pain in unspecified hip: Secondary | ICD-10-CM | POA: Diagnosis not present

## 2013-03-29 DIAGNOSIS — R262 Difficulty in walking, not elsewhere classified: Secondary | ICD-10-CM | POA: Diagnosis not present

## 2013-03-29 DIAGNOSIS — Z87440 Personal history of urinary (tract) infections: Secondary | ICD-10-CM | POA: Diagnosis not present

## 2013-03-29 DIAGNOSIS — I10 Essential (primary) hypertension: Secondary | ICD-10-CM | POA: Diagnosis not present

## 2013-03-29 DIAGNOSIS — Z9181 History of falling: Secondary | ICD-10-CM | POA: Diagnosis not present

## 2013-03-30 DIAGNOSIS — Z9181 History of falling: Secondary | ICD-10-CM | POA: Diagnosis not present

## 2013-03-30 DIAGNOSIS — Z87440 Personal history of urinary (tract) infections: Secondary | ICD-10-CM | POA: Diagnosis not present

## 2013-03-30 DIAGNOSIS — M25559 Pain in unspecified hip: Secondary | ICD-10-CM | POA: Diagnosis not present

## 2013-03-30 DIAGNOSIS — I10 Essential (primary) hypertension: Secondary | ICD-10-CM | POA: Diagnosis not present

## 2013-03-30 DIAGNOSIS — R262 Difficulty in walking, not elsewhere classified: Secondary | ICD-10-CM | POA: Diagnosis not present

## 2013-03-30 DIAGNOSIS — R413 Other amnesia: Secondary | ICD-10-CM | POA: Diagnosis not present

## 2013-04-05 DIAGNOSIS — Z9181 History of falling: Secondary | ICD-10-CM | POA: Diagnosis not present

## 2013-04-05 DIAGNOSIS — R413 Other amnesia: Secondary | ICD-10-CM | POA: Diagnosis not present

## 2013-04-05 DIAGNOSIS — R262 Difficulty in walking, not elsewhere classified: Secondary | ICD-10-CM | POA: Diagnosis not present

## 2013-04-05 DIAGNOSIS — Z87440 Personal history of urinary (tract) infections: Secondary | ICD-10-CM | POA: Diagnosis not present

## 2013-04-05 DIAGNOSIS — I10 Essential (primary) hypertension: Secondary | ICD-10-CM | POA: Diagnosis not present

## 2013-04-05 DIAGNOSIS — M25559 Pain in unspecified hip: Secondary | ICD-10-CM | POA: Diagnosis not present

## 2013-04-06 DIAGNOSIS — Z9181 History of falling: Secondary | ICD-10-CM | POA: Diagnosis not present

## 2013-04-06 DIAGNOSIS — M25559 Pain in unspecified hip: Secondary | ICD-10-CM | POA: Diagnosis not present

## 2013-04-06 DIAGNOSIS — Z87440 Personal history of urinary (tract) infections: Secondary | ICD-10-CM | POA: Diagnosis not present

## 2013-04-06 DIAGNOSIS — I10 Essential (primary) hypertension: Secondary | ICD-10-CM | POA: Diagnosis not present

## 2013-04-06 DIAGNOSIS — R413 Other amnesia: Secondary | ICD-10-CM | POA: Diagnosis not present

## 2013-04-06 DIAGNOSIS — R262 Difficulty in walking, not elsewhere classified: Secondary | ICD-10-CM | POA: Diagnosis not present

## 2013-04-07 DIAGNOSIS — Z9181 History of falling: Secondary | ICD-10-CM | POA: Diagnosis not present

## 2013-04-07 DIAGNOSIS — I10 Essential (primary) hypertension: Secondary | ICD-10-CM | POA: Diagnosis not present

## 2013-04-07 DIAGNOSIS — R413 Other amnesia: Secondary | ICD-10-CM | POA: Diagnosis not present

## 2013-04-07 DIAGNOSIS — Z87440 Personal history of urinary (tract) infections: Secondary | ICD-10-CM | POA: Diagnosis not present

## 2013-04-07 DIAGNOSIS — M25559 Pain in unspecified hip: Secondary | ICD-10-CM | POA: Diagnosis not present

## 2013-04-07 DIAGNOSIS — R262 Difficulty in walking, not elsewhere classified: Secondary | ICD-10-CM | POA: Diagnosis not present

## 2013-04-11 DIAGNOSIS — M25559 Pain in unspecified hip: Secondary | ICD-10-CM | POA: Diagnosis not present

## 2013-04-11 DIAGNOSIS — Z87440 Personal history of urinary (tract) infections: Secondary | ICD-10-CM | POA: Diagnosis not present

## 2013-04-11 DIAGNOSIS — R413 Other amnesia: Secondary | ICD-10-CM | POA: Diagnosis not present

## 2013-04-11 DIAGNOSIS — I10 Essential (primary) hypertension: Secondary | ICD-10-CM | POA: Diagnosis not present

## 2013-04-11 DIAGNOSIS — Z9181 History of falling: Secondary | ICD-10-CM | POA: Diagnosis not present

## 2013-04-11 DIAGNOSIS — R262 Difficulty in walking, not elsewhere classified: Secondary | ICD-10-CM | POA: Diagnosis not present

## 2013-04-15 DIAGNOSIS — R262 Difficulty in walking, not elsewhere classified: Secondary | ICD-10-CM | POA: Diagnosis not present

## 2013-04-15 DIAGNOSIS — Z87440 Personal history of urinary (tract) infections: Secondary | ICD-10-CM | POA: Diagnosis not present

## 2013-04-15 DIAGNOSIS — I10 Essential (primary) hypertension: Secondary | ICD-10-CM | POA: Diagnosis not present

## 2013-04-15 DIAGNOSIS — M25559 Pain in unspecified hip: Secondary | ICD-10-CM | POA: Diagnosis not present

## 2013-04-15 DIAGNOSIS — R413 Other amnesia: Secondary | ICD-10-CM | POA: Diagnosis not present

## 2013-04-15 DIAGNOSIS — Z9181 History of falling: Secondary | ICD-10-CM | POA: Diagnosis not present

## 2013-04-18 DIAGNOSIS — M25559 Pain in unspecified hip: Secondary | ICD-10-CM | POA: Diagnosis not present

## 2013-04-18 DIAGNOSIS — R262 Difficulty in walking, not elsewhere classified: Secondary | ICD-10-CM | POA: Diagnosis not present

## 2013-04-18 DIAGNOSIS — R413 Other amnesia: Secondary | ICD-10-CM | POA: Diagnosis not present

## 2013-04-18 DIAGNOSIS — I10 Essential (primary) hypertension: Secondary | ICD-10-CM | POA: Diagnosis not present

## 2013-04-18 DIAGNOSIS — Z87440 Personal history of urinary (tract) infections: Secondary | ICD-10-CM | POA: Diagnosis not present

## 2013-04-18 DIAGNOSIS — Z9181 History of falling: Secondary | ICD-10-CM | POA: Diagnosis not present

## 2013-04-20 DIAGNOSIS — I10 Essential (primary) hypertension: Secondary | ICD-10-CM | POA: Diagnosis not present

## 2013-04-20 DIAGNOSIS — R262 Difficulty in walking, not elsewhere classified: Secondary | ICD-10-CM | POA: Diagnosis not present

## 2013-04-20 DIAGNOSIS — M25559 Pain in unspecified hip: Secondary | ICD-10-CM | POA: Diagnosis not present

## 2013-04-20 DIAGNOSIS — Z87440 Personal history of urinary (tract) infections: Secondary | ICD-10-CM | POA: Diagnosis not present

## 2013-04-20 DIAGNOSIS — R413 Other amnesia: Secondary | ICD-10-CM | POA: Diagnosis not present

## 2013-04-20 DIAGNOSIS — Z9181 History of falling: Secondary | ICD-10-CM | POA: Diagnosis not present

## 2013-04-25 DIAGNOSIS — M25559 Pain in unspecified hip: Secondary | ICD-10-CM | POA: Diagnosis not present

## 2013-04-25 DIAGNOSIS — R413 Other amnesia: Secondary | ICD-10-CM | POA: Diagnosis not present

## 2013-04-25 DIAGNOSIS — Z87440 Personal history of urinary (tract) infections: Secondary | ICD-10-CM | POA: Diagnosis not present

## 2013-04-25 DIAGNOSIS — R262 Difficulty in walking, not elsewhere classified: Secondary | ICD-10-CM | POA: Diagnosis not present

## 2013-04-25 DIAGNOSIS — I10 Essential (primary) hypertension: Secondary | ICD-10-CM | POA: Diagnosis not present

## 2013-04-25 DIAGNOSIS — Z9181 History of falling: Secondary | ICD-10-CM | POA: Diagnosis not present

## 2013-04-28 DIAGNOSIS — I10 Essential (primary) hypertension: Secondary | ICD-10-CM | POA: Diagnosis not present

## 2013-04-28 DIAGNOSIS — R262 Difficulty in walking, not elsewhere classified: Secondary | ICD-10-CM | POA: Diagnosis not present

## 2013-04-28 DIAGNOSIS — Z87440 Personal history of urinary (tract) infections: Secondary | ICD-10-CM | POA: Diagnosis not present

## 2013-04-28 DIAGNOSIS — R413 Other amnesia: Secondary | ICD-10-CM | POA: Diagnosis not present

## 2013-04-28 DIAGNOSIS — Z9181 History of falling: Secondary | ICD-10-CM | POA: Diagnosis not present

## 2013-04-28 DIAGNOSIS — M25559 Pain in unspecified hip: Secondary | ICD-10-CM | POA: Diagnosis not present

## 2013-05-02 DIAGNOSIS — I10 Essential (primary) hypertension: Secondary | ICD-10-CM | POA: Diagnosis not present

## 2013-05-02 DIAGNOSIS — R262 Difficulty in walking, not elsewhere classified: Secondary | ICD-10-CM | POA: Diagnosis not present

## 2013-05-02 DIAGNOSIS — M25559 Pain in unspecified hip: Secondary | ICD-10-CM | POA: Diagnosis not present

## 2013-05-02 DIAGNOSIS — R413 Other amnesia: Secondary | ICD-10-CM | POA: Diagnosis not present

## 2013-05-02 DIAGNOSIS — Z9181 History of falling: Secondary | ICD-10-CM | POA: Diagnosis not present

## 2013-05-02 DIAGNOSIS — Z87440 Personal history of urinary (tract) infections: Secondary | ICD-10-CM | POA: Diagnosis not present

## 2013-05-04 DIAGNOSIS — Z9181 History of falling: Secondary | ICD-10-CM | POA: Diagnosis not present

## 2013-05-04 DIAGNOSIS — I10 Essential (primary) hypertension: Secondary | ICD-10-CM | POA: Diagnosis not present

## 2013-05-04 DIAGNOSIS — M25559 Pain in unspecified hip: Secondary | ICD-10-CM | POA: Diagnosis not present

## 2013-05-04 DIAGNOSIS — Z87440 Personal history of urinary (tract) infections: Secondary | ICD-10-CM | POA: Diagnosis not present

## 2013-05-04 DIAGNOSIS — R262 Difficulty in walking, not elsewhere classified: Secondary | ICD-10-CM | POA: Diagnosis not present

## 2013-05-04 DIAGNOSIS — R413 Other amnesia: Secondary | ICD-10-CM | POA: Diagnosis not present

## 2013-05-09 DIAGNOSIS — I10 Essential (primary) hypertension: Secondary | ICD-10-CM | POA: Diagnosis not present

## 2013-05-09 DIAGNOSIS — M25559 Pain in unspecified hip: Secondary | ICD-10-CM | POA: Diagnosis not present

## 2013-05-09 DIAGNOSIS — R262 Difficulty in walking, not elsewhere classified: Secondary | ICD-10-CM | POA: Diagnosis not present

## 2013-05-09 DIAGNOSIS — R413 Other amnesia: Secondary | ICD-10-CM | POA: Diagnosis not present

## 2013-05-09 DIAGNOSIS — Z9181 History of falling: Secondary | ICD-10-CM | POA: Diagnosis not present

## 2013-05-09 DIAGNOSIS — Z87440 Personal history of urinary (tract) infections: Secondary | ICD-10-CM | POA: Diagnosis not present

## 2013-05-10 DIAGNOSIS — M25559 Pain in unspecified hip: Secondary | ICD-10-CM | POA: Diagnosis not present

## 2013-05-10 DIAGNOSIS — R413 Other amnesia: Secondary | ICD-10-CM | POA: Diagnosis not present

## 2013-05-10 DIAGNOSIS — I10 Essential (primary) hypertension: Secondary | ICD-10-CM | POA: Diagnosis not present

## 2013-05-10 DIAGNOSIS — Z87440 Personal history of urinary (tract) infections: Secondary | ICD-10-CM | POA: Diagnosis not present

## 2013-05-10 DIAGNOSIS — Z9181 History of falling: Secondary | ICD-10-CM | POA: Diagnosis not present

## 2013-05-10 DIAGNOSIS — R262 Difficulty in walking, not elsewhere classified: Secondary | ICD-10-CM | POA: Diagnosis not present

## 2013-05-21 DIAGNOSIS — Z23 Encounter for immunization: Secondary | ICD-10-CM | POA: Diagnosis not present

## 2013-06-07 DIAGNOSIS — Z0000 Encounter for general adult medical examination without abnormal findings: Secondary | ICD-10-CM | POA: Diagnosis not present

## 2013-06-07 DIAGNOSIS — Z79899 Other long term (current) drug therapy: Secondary | ICD-10-CM | POA: Diagnosis not present

## 2013-06-07 DIAGNOSIS — Z1331 Encounter for screening for depression: Secondary | ICD-10-CM | POA: Diagnosis not present

## 2013-06-07 DIAGNOSIS — D649 Anemia, unspecified: Secondary | ICD-10-CM | POA: Diagnosis not present

## 2013-06-07 DIAGNOSIS — I10 Essential (primary) hypertension: Secondary | ICD-10-CM | POA: Diagnosis not present

## 2013-12-05 DIAGNOSIS — Z01419 Encounter for gynecological examination (general) (routine) without abnormal findings: Secondary | ICD-10-CM | POA: Diagnosis not present

## 2013-12-05 DIAGNOSIS — Z1231 Encounter for screening mammogram for malignant neoplasm of breast: Secondary | ICD-10-CM | POA: Diagnosis not present

## 2013-12-07 DIAGNOSIS — I10 Essential (primary) hypertension: Secondary | ICD-10-CM | POA: Diagnosis not present

## 2013-12-07 DIAGNOSIS — R5381 Other malaise: Secondary | ICD-10-CM | POA: Diagnosis not present

## 2013-12-07 DIAGNOSIS — Z79899 Other long term (current) drug therapy: Secondary | ICD-10-CM | POA: Diagnosis not present

## 2013-12-07 DIAGNOSIS — D509 Iron deficiency anemia, unspecified: Secondary | ICD-10-CM | POA: Diagnosis not present

## 2013-12-07 DIAGNOSIS — R5383 Other fatigue: Secondary | ICD-10-CM | POA: Diagnosis not present

## 2014-02-15 DIAGNOSIS — H5200 Hypermetropia, unspecified eye: Secondary | ICD-10-CM | POA: Diagnosis not present

## 2014-02-15 DIAGNOSIS — H2510 Age-related nuclear cataract, unspecified eye: Secondary | ICD-10-CM | POA: Diagnosis not present

## 2014-02-15 DIAGNOSIS — H40019 Open angle with borderline findings, low risk, unspecified eye: Secondary | ICD-10-CM | POA: Diagnosis not present

## 2014-02-15 DIAGNOSIS — H40059 Ocular hypertension, unspecified eye: Secondary | ICD-10-CM | POA: Diagnosis not present

## 2014-02-15 DIAGNOSIS — H18509 Unspecified hereditary corneal dystrophies, unspecified eye: Secondary | ICD-10-CM | POA: Diagnosis not present

## 2014-02-15 DIAGNOSIS — H52229 Regular astigmatism, unspecified eye: Secondary | ICD-10-CM | POA: Diagnosis not present

## 2014-05-12 DIAGNOSIS — F05 Delirium due to known physiological condition: Secondary | ICD-10-CM | POA: Diagnosis not present

## 2014-05-12 DIAGNOSIS — R350 Frequency of micturition: Secondary | ICD-10-CM | POA: Diagnosis not present

## 2014-05-12 DIAGNOSIS — R42 Dizziness and giddiness: Secondary | ICD-10-CM | POA: Diagnosis not present

## 2014-06-01 DIAGNOSIS — Z23 Encounter for immunization: Secondary | ICD-10-CM | POA: Diagnosis not present

## 2014-06-09 DIAGNOSIS — I10 Essential (primary) hypertension: Secondary | ICD-10-CM | POA: Diagnosis not present

## 2014-06-09 DIAGNOSIS — M8580 Other specified disorders of bone density and structure, unspecified site: Secondary | ICD-10-CM | POA: Diagnosis not present

## 2014-06-09 DIAGNOSIS — Z23 Encounter for immunization: Secondary | ICD-10-CM | POA: Diagnosis not present

## 2014-06-09 DIAGNOSIS — F325 Major depressive disorder, single episode, in full remission: Secondary | ICD-10-CM | POA: Diagnosis not present

## 2014-06-09 DIAGNOSIS — Z0000 Encounter for general adult medical examination without abnormal findings: Secondary | ICD-10-CM | POA: Diagnosis not present

## 2014-06-09 DIAGNOSIS — Z1389 Encounter for screening for other disorder: Secondary | ICD-10-CM | POA: Diagnosis not present

## 2014-06-09 DIAGNOSIS — Z79899 Other long term (current) drug therapy: Secondary | ICD-10-CM | POA: Diagnosis not present

## 2014-06-12 DIAGNOSIS — Z79899 Other long term (current) drug therapy: Secondary | ICD-10-CM | POA: Diagnosis not present

## 2014-09-06 DIAGNOSIS — H3589 Other specified retinal disorders: Secondary | ICD-10-CM | POA: Diagnosis not present

## 2014-09-06 DIAGNOSIS — H40019 Open angle with borderline findings, low risk, unspecified eye: Secondary | ICD-10-CM | POA: Diagnosis not present

## 2014-09-06 DIAGNOSIS — H40059 Ocular hypertension, unspecified eye: Secondary | ICD-10-CM | POA: Diagnosis not present

## 2014-10-04 DIAGNOSIS — R42 Dizziness and giddiness: Secondary | ICD-10-CM | POA: Diagnosis not present

## 2014-10-04 DIAGNOSIS — R609 Edema, unspecified: Secondary | ICD-10-CM | POA: Diagnosis not present

## 2014-10-04 DIAGNOSIS — I10 Essential (primary) hypertension: Secondary | ICD-10-CM | POA: Diagnosis not present

## 2014-12-19 ENCOUNTER — Other Ambulatory Visit: Payer: Self-pay | Admitting: Obstetrics & Gynecology

## 2014-12-19 DIAGNOSIS — Z1272 Encounter for screening for malignant neoplasm of vagina: Secondary | ICD-10-CM | POA: Diagnosis not present

## 2014-12-19 DIAGNOSIS — N390 Urinary tract infection, site not specified: Secondary | ICD-10-CM | POA: Diagnosis not present

## 2014-12-19 DIAGNOSIS — Z124 Encounter for screening for malignant neoplasm of cervix: Secondary | ICD-10-CM | POA: Diagnosis not present

## 2014-12-19 DIAGNOSIS — Z6824 Body mass index (BMI) 24.0-24.9, adult: Secondary | ICD-10-CM | POA: Diagnosis not present

## 2014-12-19 DIAGNOSIS — Z1231 Encounter for screening mammogram for malignant neoplasm of breast: Secondary | ICD-10-CM | POA: Diagnosis not present

## 2014-12-20 LAB — CYTOLOGY - PAP

## 2015-01-10 DIAGNOSIS — F325 Major depressive disorder, single episode, in full remission: Secondary | ICD-10-CM | POA: Diagnosis not present

## 2015-01-10 DIAGNOSIS — I10 Essential (primary) hypertension: Secondary | ICD-10-CM | POA: Diagnosis not present

## 2015-01-10 DIAGNOSIS — R413 Other amnesia: Secondary | ICD-10-CM | POA: Diagnosis not present

## 2015-01-10 DIAGNOSIS — Z79899 Other long term (current) drug therapy: Secondary | ICD-10-CM | POA: Diagnosis not present

## 2015-02-14 DIAGNOSIS — H40053 Ocular hypertension, bilateral: Secondary | ICD-10-CM | POA: Diagnosis not present

## 2015-02-14 DIAGNOSIS — H524 Presbyopia: Secondary | ICD-10-CM | POA: Diagnosis not present

## 2015-02-14 DIAGNOSIS — H40019 Open angle with borderline findings, low risk, unspecified eye: Secondary | ICD-10-CM | POA: Diagnosis not present

## 2015-02-14 DIAGNOSIS — H5203 Hypermetropia, bilateral: Secondary | ICD-10-CM | POA: Diagnosis not present

## 2015-02-14 DIAGNOSIS — H52223 Regular astigmatism, bilateral: Secondary | ICD-10-CM | POA: Diagnosis not present

## 2015-04-19 DIAGNOSIS — Z23 Encounter for immunization: Secondary | ICD-10-CM | POA: Diagnosis not present

## 2015-06-01 DIAGNOSIS — H401131 Primary open-angle glaucoma, bilateral, mild stage: Secondary | ICD-10-CM | POA: Diagnosis not present

## 2015-06-15 DIAGNOSIS — I10 Essential (primary) hypertension: Secondary | ICD-10-CM | POA: Diagnosis not present

## 2015-06-15 DIAGNOSIS — Z1389 Encounter for screening for other disorder: Secondary | ICD-10-CM | POA: Diagnosis not present

## 2015-06-15 DIAGNOSIS — Z0000 Encounter for general adult medical examination without abnormal findings: Secondary | ICD-10-CM | POA: Diagnosis not present

## 2015-06-15 DIAGNOSIS — Z79899 Other long term (current) drug therapy: Secondary | ICD-10-CM | POA: Diagnosis not present

## 2015-06-15 DIAGNOSIS — F325 Major depressive disorder, single episode, in full remission: Secondary | ICD-10-CM | POA: Diagnosis not present

## 2015-06-17 ENCOUNTER — Emergency Department (HOSPITAL_COMMUNITY)
Admission: EM | Admit: 2015-06-17 | Discharge: 2015-06-18 | Disposition: A | Discharge status: Home / Self Care | Payer: Medicare Other | Attending: Emergency Medicine | Admitting: Emergency Medicine

## 2015-06-17 ENCOUNTER — Encounter (HOSPITAL_COMMUNITY): Payer: Self-pay

## 2015-06-17 ENCOUNTER — Emergency Department (HOSPITAL_COMMUNITY): Payer: Medicare Other

## 2015-06-17 DIAGNOSIS — Y9389 Activity, other specified: Secondary | ICD-10-CM | POA: Diagnosis not present

## 2015-06-17 DIAGNOSIS — I10 Essential (primary) hypertension: Secondary | ICD-10-CM | POA: Diagnosis not present

## 2015-06-17 DIAGNOSIS — Y9289 Other specified places as the place of occurrence of the external cause: Secondary | ICD-10-CM | POA: Diagnosis not present

## 2015-06-17 DIAGNOSIS — S0181XA Laceration without foreign body of other part of head, initial encounter: Secondary | ICD-10-CM | POA: Diagnosis not present

## 2015-06-17 DIAGNOSIS — Y998 Other external cause status: Secondary | ICD-10-CM | POA: Diagnosis not present

## 2015-06-17 DIAGNOSIS — Z79899 Other long term (current) drug therapy: Secondary | ICD-10-CM | POA: Diagnosis not present

## 2015-06-17 DIAGNOSIS — W19XXXA Unspecified fall, initial encounter: Secondary | ICD-10-CM

## 2015-06-17 DIAGNOSIS — Z7982 Long term (current) use of aspirin: Secondary | ICD-10-CM | POA: Diagnosis not present

## 2015-06-17 DIAGNOSIS — Z87440 Personal history of urinary (tract) infections: Secondary | ICD-10-CM | POA: Insufficient documentation

## 2015-06-17 DIAGNOSIS — S50312A Abrasion of left elbow, initial encounter: Secondary | ICD-10-CM | POA: Diagnosis not present

## 2015-06-17 DIAGNOSIS — F0390 Unspecified dementia without behavioral disturbance: Secondary | ICD-10-CM | POA: Diagnosis not present

## 2015-06-17 DIAGNOSIS — W25XXXA Contact with sharp glass, initial encounter: Secondary | ICD-10-CM | POA: Insufficient documentation

## 2015-06-17 DIAGNOSIS — S01112A Laceration without foreign body of left eyelid and periocular area, initial encounter: Secondary | ICD-10-CM | POA: Diagnosis not present

## 2015-06-17 LAB — CBC WITH DIFFERENTIAL/PLATELET
Basophils Absolute: 0.1 10*3/uL (ref 0.0–0.1)
Basophils Relative: 1 %
Eosinophils Absolute: 0.2 10*3/uL (ref 0.0–0.7)
Eosinophils Relative: 2 %
HCT: 38 % (ref 36.0–46.0)
Hemoglobin: 12.3 g/dL (ref 12.0–15.0)
Lymphocytes Relative: 18 %
Lymphs Abs: 1.7 10*3/uL (ref 0.7–4.0)
MCH: 29.1 pg (ref 26.0–34.0)
MCHC: 32.4 g/dL (ref 30.0–36.0)
MCV: 89.8 fL (ref 78.0–100.0)
Monocytes Absolute: 1.3 10*3/uL — ABNORMAL HIGH (ref 0.1–1.0)
Monocytes Relative: 14 %
Neutro Abs: 6.2 10*3/uL (ref 1.7–7.7)
Neutrophils Relative %: 65 %
Platelets: 286 10*3/uL (ref 150–400)
RBC: 4.23 MIL/uL (ref 3.87–5.11)
RDW: 14.8 % (ref 11.5–15.5)
WBC: 9.4 10*3/uL (ref 4.0–10.5)

## 2015-06-17 MED ORDER — LIDOCAINE-EPINEPHRINE (PF) 2 %-1:200000 IJ SOLN
20.0000 mL | Freq: Once | INTRAMUSCULAR | Status: AC
  Administered 2015-06-18: 20 mL via INTRADERMAL
  Filled 2015-06-17: qty 20

## 2015-06-17 NOTE — ED Notes (Signed)
Abrasion to left elbow.

## 2015-06-17 NOTE — ED Notes (Signed)
Pt here with son.  Onset 8:10pm pt was feeding cat and doesn't remember falling on floor or if she tripped over cat.  Pt had on glasses, 2cm lac on left forehead, edges unapproximated, bleeding controlled, bruising to left eye.  No chest pain or shortness of breath.

## 2015-06-17 NOTE — ED Provider Notes (Signed)
CSN: [id number]     Arrival date & time 06/17/15  2121 History   First MD Initiated Contact with Patient 06/17/15 2222     Chief Complaint  Patient presents with  . Laceration  . Fall     (Consider location/radiation/quality/duration/timing/severity/associated sxs/prior Treatment) HPI Comments: Patient with a history of HTN presents after fall causing laceration to left eye brow. She does not know why she fell. She denies having any chest pain, difficulty breathing, vomiting, recent illness. She was putting away the cat food after feeding their cat. She thinks it is possible that she tripped over the cat. She does not think she passed out. No neck pain, current headache, nausea or visual impairment.   Patient is a 81 y.o. female presenting with skin laceration and fall. The history is provided by the patient. No language interpreter was used.  Laceration Location:  Face Facial laceration location:  L eyebrow Foreign body present:  No foreign bodies Fall Pertinent negatives include no chills, fever or headaches.    Past Medical History  Diagnosis Date  . UTI (lower urinary tract infection)   . Hypertension   . Mitral valve prolapse   . Hyponatremia 12/17/2011  . DEMENTIA     very mild    Past Surgical History  Procedure Laterality Date  . Abdominal hysterectomy    . Tonsillectomy     Family History  Problem Relation Age of Onset  . Cancer Sister    Social History  Substance Use Topics  . Smoking status: Never Smoker   . Smokeless tobacco: Never Used  . Alcohol Use: No   OB History    No data available     Review of Systems  Constitutional: Negative for fever and chills.  HENT: Negative.   Respiratory: Negative.   Cardiovascular: Negative.   Gastrointestinal: Negative.   Musculoskeletal: Negative.   Skin: Positive for wound.  Neurological: Negative.  Negative for headaches.      Allergies  Review of patient's allergies indicates no known  allergies.  Home Medications   Prior to Admission medications   Medication Sig Start Date End Date Taking? Authorizing Provider  acetaminophen (TYLENOL) 325 MG tablet Take 650 mg by mouth every 6 (six) hours as needed. Pain/fever.   Yes Historical Provider, MD  aspirin EC 81 MG tablet Take 81 mg by mouth daily.    Historical Provider, MD  atorvastatin (LIPITOR) 10 MG tablet Take 10 mg by mouth daily.    Historical Provider, MD  lisinopril (PRINIVIL,ZESTRIL) 20 MG tablet Take 20 mg by mouth daily.    Historical Provider, MD  ondansetron (ZOFRAN) 4 MG tablet Take 4 mg by mouth every 8 (eight) hours as needed. Nausea/vomiting.    Historical Provider, MD  sertraline (ZOLOFT) 25 MG tablet Take 25 mg by mouth daily.    Historical Provider, MD   BP 153/65 mmHg  Pulse 93  Temp(Src) 98.3 F (36.8 C) (Oral)  Resp 24  Ht 5\' 7"  (1.702 m)  Wt 142 lb (64.411 kg)  BMI 22.24 kg/m2  SpO2 96% Physical Exam  Constitutional: She is oriented to person, place, and time. She appears well-developed and well-nourished.  HENT:  Head: Normocephalic.  Eyes: Conjunctivae are normal. Pupils are equal, round, and reactive to light.  Neck: Normal range of motion. Neck supple.  Cardiovascular: Normal rate and regular rhythm.   No murmur heard. No carotid bruit.  Pulmonary/Chest: Effort normal and breath sounds normal. She has no wheezes. She has no rales. She  exhibits no tenderness.  Abdominal: Soft. Bowel sounds are normal. There is no tenderness. There is no rebound and no guarding.  Musculoskeletal: Normal range of motion.  FROM all extremities without discomfort. No midline or paracervical neck tenderness. Full, pain free ROM of neck.   Neurological: She is alert and oriented to person, place, and time. Coordination normal.  CN's 3-12 grossly intact. Normal coordination without deficit. Speech clear and focused.   Skin: Skin is warm and dry. No rash noted.  3 cm laceration left eye brow with a second 1 cm  laceration below. There is a skin tear over left elbow without bony deformity or tenderness.   Psychiatric: She has a normal mood and affect.    ED Course  Procedures (including critical care time) Labs Review Labs Reviewed  URINE CULTURE  BASIC METABOLIC PANEL  CBC WITH DIFFERENTIAL/PLATELET  URINALYSIS, ROUTINE W REFLEX MICROSCOPIC (NOT AT [facility])   Results for orders placed or performed during the hospital encounter of [id number]  Basic metabolic panel  Result Value Ref Range   Sodium 138 135 - 145 mmol/L   Potassium 4.5 3.5 - 5.1 mmol/L   Chloride 106 101 - 111 mmol/L   CO2 23 22 - 32 mmol/L   Glucose, Bld 130 (H) 65 - 99 mg/dL   BUN 17 6 - 20 mg/dL   Creatinine, Ser 0.94 0.44 - 1.00 mg/dL   Calcium 9.3 8.9 - 10.3 mg/dL   GFR calc non Af Amer 55 (L) >60 mL/min   GFR calc Af Amer >60 >60 mL/min   Anion gap 9 5 - 15  CBC with Differential  Result Value Ref Range   WBC 9.4 4.0 - 10.5 K/uL   RBC 4.23 3.87 - 5.11 MIL/uL   Hemoglobin 12.3 12.0 - 15.0 g/dL   HCT 38.0 36.0 - 46.0 %   MCV 89.8 78.0 - 100.0 fL   MCH 29.1 26.0 - 34.0 pg   MCHC 32.4 30.0 - 36.0 g/dL   RDW 14.8 11.5 - 15.5 %   Platelets 286 150 - 400 K/uL   Neutrophils Relative % 65 %   Neutro Abs 6.2 1.7 - 7.7 K/uL   Lymphocytes Relative 18 %   Lymphs Abs 1.7 0.7 - 4.0 K/uL   Monocytes Relative 14 %   Monocytes Absolute 1.3 (H) 0.1 - 1.0 K/uL   Eosinophils Relative 2 %   Eosinophils Absolute 0.2 0.0 - 0.7 K/uL   Basophils Relative 1 %   Basophils Absolute 0.1 0.0 - 0.1 K/uL  Urinalysis, Routine w reflex microscopic  Result Value Ref Range   Color, Urine YELLOW YELLOW   APPearance CLOUDY (A) CLEAR   Specific Gravity, Urine 1.011 1.005 - 1.030   pH 7.0 5.0 - 8.0   Glucose, UA NEGATIVE NEGATIVE mg/dL   Hgb urine dipstick TRACE (A) NEGATIVE   Bilirubin Urine NEGATIVE NEGATIVE   Ketones, ur NEGATIVE NEGATIVE mg/dL   Protein, ur NEGATIVE NEGATIVE mg/dL   Urobilinogen, UA 0.2 0.0 - 1.0 mg/dL   Nitrite  NEGATIVE NEGATIVE   Leukocytes, UA TRACE (A) NEGATIVE  Urine microscopic-add on  Result Value Ref Range   Squamous Epithelial / LPF RARE RARE   WBC, UA 0-2 <3 WBC/hpf   RBC / HPF 0-2 <3 RBC/hpf   Bacteria, UA FEW (A) RARE   Ct Head Wo Contrast  06/17/2015  CLINICAL DATA:  May have tripped over cat. Fell onto floor, with laceration at the left forehead and left periorbital bruising. Concern for  cervical spine injury. Initial encounter. EXAM: CT HEAD WITHOUT CONTRAST CT CERVICAL SPINE WITHOUT CONTRAST TECHNIQUE: Multidetector CT imaging of the head and cervical spine was performed following the standard protocol without intravenous contrast. Multiplanar CT image reconstructions of the cervical spine were also generated. COMPARISON:  CT of the head performed 02/17/2012, and cervical spine radiographs performed 07/21/2012. MRI of the brain performed 08/20/2012 FINDINGS: CT HEAD FINDINGS There is no evidence of acute infarction, mass lesion, or intra- or extra-axial hemorrhage on CT. Prominence of the sulci suggests mild cortical volume loss. 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 Scattered periventricular and subcortical white matter change likely reflects small vessel ischemic microangiopathy. The brainstem and fourth ventricle are within normal limits. The basal ganglia are unremarkable in appearance. No mass effect or midline shift is seen. There is no evidence of fracture; visualized osseous structures are unremarkable in appearance. The orbits are within normal limits. The paranasal sinuses and mastoid air cells are well-aerated. Mild soft tissue swelling is noted overlying the left orbit, with associated soft tissue laceration. CT CERVICAL SPINE FINDINGS There is no evidence of fracture or subluxation. Vertebral bodies demonstrate normal height. There is minimal grade 1 anterolisthesis of C4 on C5, and mild disc space narrowing at C5-C6,  with a few small anterior and posterior disc osteophyte complexes. Mild underlying facet disease is noted. Prevertebral soft tissues are within normal limits. The thyroid gland is unremarkable in appearance. Scarring is noted at the lung apices. Mild calcification is noted at the carotid bifurcations bilaterally. IMPRESSION: 1. No evidence of traumatic intracranial injury or fracture. 2. No evidence of fracture or subluxation along the cervical spine. 3. Mild soft tissue swelling overlying the left orbit, with associated soft tissue laceration. 4. Mild cortical volume loss and scattered small vessel ischemic microangiopathy. 5. 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 6. Minimal degenerative change along the cervical spine. 7. Scarring at the lung apices. 8. Mild calcification at the carotid bifurcations bilaterally. Electronically Signed   By: Garald Balding M.D.   On: 06/17/2015 23:45   Ct Cervical Spine Wo Contrast  06/17/2015  CLINICAL DATA:  May have tripped over cat. Fell onto floor, with laceration at the left forehead and left periorbital bruising. Concern for cervical spine injury. Initial encounter. EXAM: CT HEAD WITHOUT CONTRAST CT CERVICAL SPINE WITHOUT CONTRAST TECHNIQUE: Multidetector CT imaging of the head and cervical spine was performed following the standard protocol without intravenous contrast. Multiplanar CT image reconstructions of the cervical spine were also generated. COMPARISON:  CT of the head performed 02/17/2012, and cervical spine radiographs performed 07/21/2012. MRI of the brain performed 08/20/2012 FINDINGS: CT HEAD FINDINGS There is no evidence of acute infarction, mass lesion, or intra- or extra-axial hemorrhage on CT. Prominence of the sulci suggests mild cortical volume loss. 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 Scattered periventricular and subcortical white matter  change likely reflects small vessel ischemic microangiopathy. The brainstem and fourth ventricle are within normal limits. The basal ganglia are unremarkable in appearance. No mass effect or midline shift is seen. There is no evidence of fracture; visualized osseous structures are unremarkable in appearance. The orbits are within normal limits. The paranasal sinuses and mastoid air cells are well-aerated. Mild soft tissue swelling is noted overlying the left orbit, with associated soft tissue laceration. CT CERVICAL SPINE FINDINGS There is no evidence of  fracture or subluxation. Vertebral bodies demonstrate normal height. There is minimal grade 1 anterolisthesis of C4 on C5, and mild disc space narrowing at C5-C6, with a few small anterior and posterior disc osteophyte complexes. Mild underlying facet disease is noted. Prevertebral soft tissues are within normal limits. The thyroid gland is unremarkable in appearance. Scarring is noted at the lung apices. Mild calcification is noted at the carotid bifurcations bilaterally. IMPRESSION: 1. No evidence of traumatic intracranial injury or fracture. 2. No evidence of fracture or subluxation along the cervical spine. 3. Mild soft tissue swelling overlying the left orbit, with associated soft tissue laceration. 4. Mild cortical volume loss and scattered small vessel ischemic microangiopathy. 5. 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 6. Minimal degenerative change along the cervical spine. 7. Scarring at the lung apices. 8. Mild calcification at the carotid bifurcations bilaterally. Electronically Signed   By: Garald Balding M.D.   On: 06/17/2015 23:45    Imaging Review No results found. I have personally reviewed and evaluated these images and lab results as part of my medical decision-making.   EKG Interpretation None     LACERATION REPAIR Performed by: Charlann Lange A Authorized by: Charlann Lange A Consent:  Verbal consent obtained. Risks and benefits: risks, benefits and alternatives were discussed Consent given by: patient Patient identity confirmed: provided demographic data Prepped and Draped in normal sterile fashion Wound explored  Laceration Location: left eye brow  Laceration Length: 3 cm  No Foreign Bodies seen or palpated  Anesthesia: local infiltration  Local anesthetic: lidocaine 2% w/epinephrine  Anesthetic total: 2 ml  Irrigation method: syringe Amount of cleaning: standard  Skin closure: 6-0 prolene  Number of sutures: 8  Technique: running  Patient tolerance: Patient tolerated the procedure well with no immediate complications.  LACERATION REPAIR Performed by: Charlann Lange A Authorized by: Charlann Lange A Consent: Verbal consent obtained. Risks and benefits: risks, benefits and alternatives were discussed Consent given by: patient Patient identity confirmed: provided demographic data Prepped and Draped in normal sterile fashion Wound explored  Laceration Location: left eye brow  Laceration Length: 1 cm  No Foreign Bodies seen or palpated  Anesthesia: local infiltration  Local anesthetic: lidocaine 2% w/epinephrine  Anesthetic total: 2 ml  Irrigation method: syringe Amount of cleaning: standard  Skin closure: 6-0 prolene  Number of sutures: 2  Technique: simple interrupted  Patient tolerance: Patient tolerated the procedure well with no immediate complications.  MDM   Final diagnoses:  None    1. fall 2. Facial lacerations   Normal lab evaluation, normal EKG, Head and neck CT without abnormality. She has ambulated in the department without imbalance. Feel she can be discharged home. Doubt syncope. She is here with family who will provide care at home.   Charlann Lange, PA-C 06/18/15 [id number]  Carmin Muskrat, MD 06/18/15 [phone]

## 2015-06-18 DIAGNOSIS — S0181XA Laceration without foreign body of other part of head, initial encounter: Secondary | ICD-10-CM | POA: Diagnosis not present

## 2015-06-18 LAB — URINALYSIS, ROUTINE W REFLEX MICROSCOPIC
BILIRUBIN URINE: NEGATIVE
GLUCOSE, UA: NEGATIVE mg/dL
KETONES UR: NEGATIVE mg/dL
Nitrite: NEGATIVE
PH: 7 (ref 5.0–8.0)
Protein, ur: NEGATIVE mg/dL
Specific Gravity, Urine: 1.011 (ref 1.005–1.030)
Urobilinogen, UA: 0.2 mg/dL (ref 0.0–1.0)

## 2015-06-18 LAB — BASIC METABOLIC PANEL
Anion gap: 9 (ref 5–15)
BUN: 17 mg/dL (ref 6–20)
CALCIUM: 9.3 mg/dL (ref 8.9–10.3)
CO2: 23 mmol/L (ref 22–32)
Chloride: 106 mmol/L (ref 101–111)
Creatinine, Ser: 0.94 mg/dL (ref 0.44–1.00)
GFR calc Af Amer: >60 mL/min (ref >60)
GFR, EST NON AFRICAN AMERICAN: 55 mL/min — AB (ref >60)
GLUCOSE: 130 mg/dL — AB (ref 65–99)
Potassium: 4.5 mmol/L (ref 3.5–5.1)
Sodium: 138 mmol/L (ref 135–145)

## 2015-06-18 LAB — URINE MICROSCOPIC-ADD ON

## 2015-06-18 NOTE — Discharge Instructions (Signed)
Facial Laceration ° A facial laceration is a cut on the face. These injuries can be painful and cause bleeding. Lacerations usually heal quickly, but they need special care to reduce scarring. °DIAGNOSIS  °Your health care provider will take a medical history, ask for details about how the injury occurred, and examine the wound to determine how deep the cut is. °TREATMENT  °Some facial lacerations may not require closure. Others may not be able to be closed because of an increased risk of infection. The risk of infection and the chance for successful closure will depend on various factors, including the amount of time since the injury occurred. °The wound may be cleaned to help prevent infection. If closure is appropriate, pain medicines may be given if needed. Your health care provider will use stitches (sutures), wound glue (adhesive), or skin adhesive strips to repair the laceration. These tools bring the skin edges together to allow for faster healing and a better cosmetic outcome. If needed, you may also be given a tetanus shot. °HOME CARE INSTRUCTIONS °· Only take over-the-counter or prescription medicines as directed by your health care provider. °· Follow your health care provider's instructions for wound care. These instructions will vary depending on the technique used for closing the wound. °For Sutures: °· Keep the wound clean and dry.   °· If you were given a bandage (dressing), you should change it at least once a day. Also change the dressing if it becomes wet or dirty, or as directed by your health care provider.   °· Wash the wound with soap and water 2 times a day. Rinse the wound off with water to remove all soap. Pat the wound dry with a clean towel.   °· After cleaning, apply a thin layer of the antibiotic ointment recommended by your health care provider. This will help prevent infection and keep the dressing from sticking.   °· You may shower as usual after the first 24 hours. Do not soak the  wound in water until the sutures are removed.   °· Get your sutures removed as directed by your health care provider. With facial lacerations, sutures should usually be taken out after 4-5 days to avoid stitch marks.   °· Wait a few days after your sutures are removed before applying any makeup. °For Skin Adhesive Strips: °· Keep the wound clean and dry.   °· Do not get the skin adhesive strips wet. You may bathe carefully, using caution to keep the wound dry.   °· If the wound gets wet, pat it dry with a clean towel.   °· Skin adhesive strips will fall off on their own. You may trim the strips as the wound heals. Do not remove skin adhesive strips that are still stuck to the wound. They will fall off in time.   °For Wound Adhesive: °· You may briefly wet your wound in the shower or bath. Do not soak or scrub the wound. Do not swim. Avoid periods of heavy sweating until the skin adhesive has fallen off on its own. After showering or bathing, gently pat the wound dry with a clean towel.   °· Do not apply liquid medicine, cream medicine, ointment medicine, or makeup to your wound while the skin adhesive is in place. This may loosen the film before your wound is healed.   °· If a dressing is placed over the wound, be careful not to apply tape directly over the skin adhesive. This may cause the adhesive to be pulled off before the wound is healed.   °· Avoid   prolonged exposure to sunlight or tanning lamps while the skin adhesive is in place. °· The skin adhesive will usually remain in place for 5-10 days, then naturally fall off the skin. Do not pick at the adhesive film.   °After Healing: °Once the wound has healed, cover the wound with sunscreen during the day for 1 full year. This can help minimize scarring. Exposure to ultraviolet light in the first year will darken the scar. It can take 1-2 years for the scar to lose its redness and to heal completely.  °SEEK MEDICAL CARE IF: °· You have a fever. °SEEK IMMEDIATE  MEDICAL CARE IF: °· You have redness, pain, or swelling around the wound.   °· You see a yellowish-white fluid (pus) coming from the wound.   °  °This information is not intended to replace advice given to you by your health care provider. Make sure you discuss any questions you have with your health care provider. °  °Document Released: 08/28/2004 Document Revised: 08/11/2014 Document Reviewed: 03/03/2013 °Elsevier Interactive Patient Education ©2016 [employer] ° °

## 2015-06-20 LAB — URINE CULTURE

## 2015-06-21 ENCOUNTER — Telehealth (HOSPITAL_BASED_OUTPATIENT_CLINIC_OR_DEPARTMENT_OTHER): Payer: Self-pay | Admitting: Emergency Medicine

## 2015-06-21 NOTE — Telephone Encounter (Signed)
Post ED Visit - Positive Culture Follow-up  Culture report reviewed by antimicrobial stewardship pharmacist:  [x]  Elenor Quinones, Pharm.D. []  Heide Guile, Pharm.D., BCPS []  Parks Neptune, Pharm.D. []  Alycia Rossetti, Pharm.D., BCPS []  Hydro, [place].D., BCPS, AAHIVP []  Legrand Como, Pharm.D., BCPS, AAHIVP []  Milus Glazier, Pharm.D. []  Stephens November, Pharm.D.  Positive urine culture E. coli Treated with none, asymptomatic, and no further patient follow-up is required at this time.  Hazle Nordmann 06/21/2015, 10:34 AM

## 2015-06-21 NOTE — Progress Notes (Signed)
ED Antimicrobial Stewardship Positive Culture Follow Up   Gabrielle Hoffman is an 81 y.o. female who presented to [facility] on 06/17/2015 with a chief complaint of  Chief Complaint  Patient presents with  . Laceration  . Fall    Recent Results (from the past 720 hour(s))  Urine culture     Status: None   Collection Time: 06/17/15 11:38 PM  Result Value Ref Range Status   Specimen Description URINE, RANDOM  Final   Special Requests NONE  Final   Culture >=100,000 COLONIES/mL ESCHERICHIA COLI  Final   Report Status 06/20/2015 FINAL  Final   Organism ID, Bacteria ESCHERICHIA COLI  Final      Susceptibility   Escherichia coli - MIC*    AMPICILLIN 16 INTERMEDIATE Intermediate     CEFAZOLIN <=4 SENSITIVE Sensitive     CEFTRIAXONE <=1 SENSITIVE Sensitive     CIPROFLOXACIN >=4 RESISTANT Resistant     GENTAMICIN <=1 SENSITIVE Sensitive     IMIPENEM <=0.25 SENSITIVE Sensitive     NITROFURANTOIN <=16 SENSITIVE Sensitive     TRIMETH/SULFA <=20 SENSITIVE Sensitive     AMPICILLIN/SULBACTAM 4 INTERMEDIATE Intermediate     PIP/TAZO <=4 SENSITIVE Sensitive     * >=100,000 COLONIES/mL ESCHERICHIA COLI   No abx given after ED visit for fall. Then urine cx became positive with > 100k of E. Coli. No urinary symptoms or signs of infection so found to have asymptomatic bacteruria  Plan: No treatment necessary  Reginia Naas 06/21/2015, 8:56 AM Infectious Diseases Pharmacist Phone# [phone]

## 2015-06-22 DIAGNOSIS — Z4802 Encounter for removal of sutures: Secondary | ICD-10-CM | POA: Diagnosis not present

## 2015-06-22 DIAGNOSIS — R55 Syncope and collapse: Secondary | ICD-10-CM | POA: Diagnosis not present

## 2015-06-22 DIAGNOSIS — I10 Essential (primary) hypertension: Secondary | ICD-10-CM | POA: Diagnosis not present

## 2015-07-04 DIAGNOSIS — W19XXXA Unspecified fall, initial encounter: Secondary | ICD-10-CM | POA: Diagnosis not present

## 2015-07-04 DIAGNOSIS — R42 Dizziness and giddiness: Secondary | ICD-10-CM | POA: Diagnosis not present

## 2015-07-11 ENCOUNTER — Encounter: Payer: Self-pay | Admitting: Internal Medicine

## 2015-07-11 ENCOUNTER — Ambulatory Visit (INDEPENDENT_AMBULATORY_CARE_PROVIDER_SITE_OTHER): Payer: Medicare Other | Admitting: Internal Medicine

## 2015-07-11 VITALS — BP 140/66 | HR 96 | Ht 67.0 in | Wt 142.2 lb

## 2015-07-11 DIAGNOSIS — R55 Syncope and collapse: Secondary | ICD-10-CM | POA: Diagnosis not present

## 2015-07-11 DIAGNOSIS — I10 Essential (primary) hypertension: Secondary | ICD-10-CM

## 2015-07-11 DIAGNOSIS — I447 Left bundle-branch block, unspecified: Secondary | ICD-10-CM

## 2015-07-11 DIAGNOSIS — I459 Conduction disorder, unspecified: Secondary | ICD-10-CM | POA: Diagnosis not present

## 2015-07-11 NOTE — Assessment & Plan Note (Signed)
Her blood pressure is reasonably well controlled. No change in meds. 

## 2015-07-11 NOTE — Progress Notes (Signed)
HPI Gabrielle Hoffman is referred today by Dr. Felipa Eth for evaluation of possible syncope. She does have dementia. She was in her usual state of health until several weeks ago when she fell to the ground. She was seen in the ED and noted to have LBBB. No other explanation for her episode given. She does have HTN as well. She notes that when she fell, she is not sure if she passed out ahead of time. No loss of continence. She did not bite her tongue but did have a nice laceration on her face.  Allergies  Allergen Reactions  . Sulfa Antibiotics Rash  . Sulfamethoxazole Rash     Current Outpatient Prescriptions  Medication Sig Dispense Refill  . acetaminophen (TYLENOL) 325 MG tablet Take 650 mg by mouth every 6 (six) hours as needed. Pain/fever.    Marland Kitchen amLODipine (NORVASC) 5 MG tablet Take 5 mg by mouth daily.    Marland Kitchen lisinopril (PRINIVIL,ZESTRIL) 20 MG tablet Take 20 mg by mouth daily.    . Multiple Vitamin (MULTIVITAMIN WITH MINERALS) TABS tablet Take 1 tablet by mouth daily.    . sertraline (ZOLOFT) 100 MG tablet Take 100 mg by mouth daily.     No current facility-administered medications for this visit.     Past Medical History  Diagnosis Date  . UTI (lower urinary tract infection)   . Hypertension   . Mitral valve prolapse   . Hyponatremia 12/17/2011  . DEMENTIA     very mild   . Asthma   . Diverticulosis   . Hyperlipidemia   . Osteopenia   . Vaginal prolapse   . Memory loss   . DDD (degenerative disc disease), lumbar   . Depression   . Disc disorder of cervical region   . Venous insufficiency   . Syncope     ROS:   All systems reviewed and negative except as noted in the HPI.   Past Surgical History  Procedure Laterality Date  . Abdominal hysterectomy    . Tonsillectomy       Family History  Problem Relation Age of Onset  . Cancer Sister      Social History   Social History  . Marital Status: Widowed    Spouse Name: N/A  . Number of Children: N/A    . Years of Education: N/A   Occupational History  . Not on file.   Social History Main Topics  . Smoking status: Never Smoker   . Smokeless tobacco: Never Used  . Alcohol Use: No  . Drug Use: No  . Sexual Activity: Not Currently    Birth Control/ Protection: Post-menopausal   Other Topics Concern  . Not on file   Social History Narrative     BP 140/66 mmHg  Pulse 96  Ht 5\' 7"  (1.702 m)  Wt 142 lb 3.2 oz (64.501 kg)  BMI 22.27 kg/m2 Orthostatic vitals demonstrate that her blood pressure goes up from sitting to standing and her HR changed less that 10/min. (no evidence of orthostasis or POTS) Physical Exam:  Elderly appearing 81 yo woman, NAD HEENT: Unremarkable Neck:  6 cm, JVD, no thyromegally Lymphatics:  No adenopathy Back:  No CVA tenderness Lungs:  Clear with no wheezes HEART:  Regular rate rhythm, no murmurs, no rubs, no clicks Abd:  soft, positive bowel sounds, no organomegally, no rebound, no guarding Ext:  2 plus pulses, no edema, no cyanosis, no clubbing Skin:  No rashes no nodules Neuro:  CN  II through XII intact, motor grossly intact  EKG - nsr with LBBB  Assess/Plan:

## 2015-07-11 NOTE — Patient Instructions (Signed)
Medication Instructions:  Your physician recommends that you continue on your current medications as directed. Please refer to the Current Medication list given to you today.  Labwork: None ordered  Testing/Procedures: Your physician has requested that you have an echocardiogram. Echocardiography is a painless test that uses sound waves to create images of your heart. It provides your doctor with information about the size and shape of your heart and how well your heart's chambers and valves are working. This procedure takes approximately one hour. There are no restrictions for this procedure.  Your physician has recommended that you wear an event monitor. Event monitors are medical devices that record the heart's electrical activity. Doctors most often [place] these monitors to diagnose arrhythmias. Arrhythmias are problems with the speed or rhythm of the heartbeat. The monitor is a small, portable device. You can wear one while you do your normal daily activities. This is usually used to diagnose what is causing palpitations/syncope (passing out).  Follow-Up: Your physician recommends that you schedule a follow-up appointment in: 6-8 weeks with Dr. Lovena Le.   If you need a refill on your cardiac medications before your next appointment, please call your pharmacy.  Thank you for choosing [facility]!!

## 2015-07-11 NOTE — Assessment & Plan Note (Signed)
With LBBB, and the sudden nature of her symptoms, I suspect she is having intermittent CHB due to [employer] attacks. We discussed the treatment options. I told her she might require an ILR.  Also, with LBBB and syncope, will check a 2D echo.

## 2015-07-13 ENCOUNTER — Ambulatory Visit (INDEPENDENT_AMBULATORY_CARE_PROVIDER_SITE_OTHER): Payer: Medicare Other

## 2015-07-13 DIAGNOSIS — R55 Syncope and collapse: Secondary | ICD-10-CM | POA: Diagnosis not present

## 2015-07-13 DIAGNOSIS — I447 Left bundle-branch block, unspecified: Secondary | ICD-10-CM | POA: Diagnosis not present

## 2015-07-25 ENCOUNTER — Other Ambulatory Visit (HOSPITAL_COMMUNITY): Payer: Medicare Other

## 2015-07-26 ENCOUNTER — Other Ambulatory Visit: Payer: Self-pay

## 2015-07-26 ENCOUNTER — Ambulatory Visit (HOSPITAL_COMMUNITY): Payer: Medicare Other | Attending: Cardiovascular Disease

## 2015-07-26 DIAGNOSIS — I447 Left bundle-branch block, unspecified: Secondary | ICD-10-CM | POA: Diagnosis not present

## 2015-07-26 DIAGNOSIS — R55 Syncope and collapse: Secondary | ICD-10-CM | POA: Diagnosis not present

## 2015-07-27 ENCOUNTER — Telehealth: Payer: Self-pay | Admitting: Internal Medicine

## 2015-07-27 NOTE — Telephone Encounter (Signed)
I called the patient with her echo results and spoke with her son [name]) as well. He is very concerned about her continued dizziness and that he is having trouble managing all that is going on with her as a caregiver. The patient is having trouble understanding how to operate the monitor- her son will find this disconnected at times and she is unaware how to reconnect. She is having trouble bathing and he is having a hard time helping her. He is inquiring if home health is an option. I advised Dr. Lovena Le typically does not order this. Per [name], he is having a hard time getting help from the PCP. I advised we can address with Dr. Lovena Le to see if he will order a [facility] evaluation. I advised him I would inform Dr. Lovena Le that the patient is severely dizzy, but that her wearing the monitor will hopefully help [place] determine a cause for this. They are not checking the patient's BP at home. Again will review HH with Dr. Lovena Le and call [name] back next week. He is agreeable.

## 2015-07-30 NOTE — Telephone Encounter (Signed)
Home health needs to be ordered by the patient's primary MD. She needs blood pressure monitoring. Gabrielle Hoffman

## 2015-07-31 NOTE — Telephone Encounter (Signed)
Spoke with patient and let her know that [facility] needs to be obtained from her PCP.  She will keep her follow up on 08/22/15

## 2015-08-03 DIAGNOSIS — R2681 Unsteadiness on feet: Secondary | ICD-10-CM | POA: Diagnosis not present

## 2015-08-03 DIAGNOSIS — R42 Dizziness and giddiness: Secondary | ICD-10-CM | POA: Diagnosis not present

## 2015-08-03 DIAGNOSIS — R261 Paralytic gait: Secondary | ICD-10-CM | POA: Diagnosis not present

## 2015-08-03 DIAGNOSIS — Z743 Need for continuous supervision: Secondary | ICD-10-CM | POA: Diagnosis not present

## 2015-08-03 DIAGNOSIS — F329 Major depressive disorder, single episode, unspecified: Secondary | ICD-10-CM | POA: Diagnosis not present

## 2015-08-03 DIAGNOSIS — J45909 Unspecified asthma, uncomplicated: Secondary | ICD-10-CM | POA: Diagnosis not present

## 2015-08-03 DIAGNOSIS — M5030 Other cervical disc degeneration, unspecified cervical region: Secondary | ICD-10-CM | POA: Diagnosis not present

## 2015-08-03 DIAGNOSIS — M5136 Other intervertebral disc degeneration, lumbar region: Secondary | ICD-10-CM | POA: Diagnosis not present

## 2015-08-03 DIAGNOSIS — R296 Repeated falls: Secondary | ICD-10-CM | POA: Diagnosis not present

## 2015-08-03 DIAGNOSIS — I10 Essential (primary) hypertension: Secondary | ICD-10-CM | POA: Diagnosis not present

## 2015-08-07 DIAGNOSIS — M5030 Other cervical disc degeneration, unspecified cervical region: Secondary | ICD-10-CM | POA: Diagnosis not present

## 2015-08-07 DIAGNOSIS — M5136 Other intervertebral disc degeneration, lumbar region: Secondary | ICD-10-CM | POA: Diagnosis not present

## 2015-08-07 DIAGNOSIS — R42 Dizziness and giddiness: Secondary | ICD-10-CM | POA: Diagnosis not present

## 2015-08-07 DIAGNOSIS — R261 Paralytic gait: Secondary | ICD-10-CM | POA: Diagnosis not present

## 2015-08-07 DIAGNOSIS — R296 Repeated falls: Secondary | ICD-10-CM | POA: Diagnosis not present

## 2015-08-07 DIAGNOSIS — I10 Essential (primary) hypertension: Secondary | ICD-10-CM | POA: Diagnosis not present

## 2015-08-08 DIAGNOSIS — R296 Repeated falls: Secondary | ICD-10-CM | POA: Diagnosis not present

## 2015-08-08 DIAGNOSIS — M5136 Other intervertebral disc degeneration, lumbar region: Secondary | ICD-10-CM | POA: Diagnosis not present

## 2015-08-08 DIAGNOSIS — R261 Paralytic gait: Secondary | ICD-10-CM | POA: Diagnosis not present

## 2015-08-08 DIAGNOSIS — M5030 Other cervical disc degeneration, unspecified cervical region: Secondary | ICD-10-CM | POA: Diagnosis not present

## 2015-08-08 DIAGNOSIS — R42 Dizziness and giddiness: Secondary | ICD-10-CM | POA: Diagnosis not present

## 2015-08-08 DIAGNOSIS — I10 Essential (primary) hypertension: Secondary | ICD-10-CM | POA: Diagnosis not present

## 2015-08-10 DIAGNOSIS — M5136 Other intervertebral disc degeneration, lumbar region: Secondary | ICD-10-CM | POA: Diagnosis not present

## 2015-08-10 DIAGNOSIS — I10 Essential (primary) hypertension: Secondary | ICD-10-CM | POA: Diagnosis not present

## 2015-08-10 DIAGNOSIS — M5030 Other cervical disc degeneration, unspecified cervical region: Secondary | ICD-10-CM | POA: Diagnosis not present

## 2015-08-10 DIAGNOSIS — R261 Paralytic gait: Secondary | ICD-10-CM | POA: Diagnosis not present

## 2015-08-10 DIAGNOSIS — R42 Dizziness and giddiness: Secondary | ICD-10-CM | POA: Diagnosis not present

## 2015-08-10 DIAGNOSIS — R296 Repeated falls: Secondary | ICD-10-CM | POA: Diagnosis not present

## 2015-08-14 DIAGNOSIS — M5136 Other intervertebral disc degeneration, lumbar region: Secondary | ICD-10-CM | POA: Diagnosis not present

## 2015-08-14 DIAGNOSIS — R42 Dizziness and giddiness: Secondary | ICD-10-CM | POA: Diagnosis not present

## 2015-08-14 DIAGNOSIS — R296 Repeated falls: Secondary | ICD-10-CM | POA: Diagnosis not present

## 2015-08-14 DIAGNOSIS — M5030 Other cervical disc degeneration, unspecified cervical region: Secondary | ICD-10-CM | POA: Diagnosis not present

## 2015-08-14 DIAGNOSIS — I10 Essential (primary) hypertension: Secondary | ICD-10-CM | POA: Diagnosis not present

## 2015-08-14 DIAGNOSIS — R261 Paralytic gait: Secondary | ICD-10-CM | POA: Diagnosis not present

## 2015-08-15 ENCOUNTER — Telehealth: Payer: Self-pay | Admitting: Internal Medicine

## 2015-08-15 NOTE — Telephone Encounter (Signed)
New problem   Pt was very dizzy yesterday and bp was 147/78 and pt's son want to speak to nurse concerning this issue. Please call pt's son

## 2015-08-20 NOTE — Telephone Encounter (Signed)
Left a message for pt's son on 08/16/15.

## 2015-08-22 ENCOUNTER — Encounter: Payer: Self-pay | Admitting: Internal Medicine

## 2015-08-22 ENCOUNTER — Ambulatory Visit (INDEPENDENT_AMBULATORY_CARE_PROVIDER_SITE_OTHER): Payer: Medicare Other | Admitting: Internal Medicine

## 2015-08-22 VITALS — BP 120/56 | HR 96 | Ht 66.0 in | Wt 138.0 lb

## 2015-08-22 DIAGNOSIS — F0390 Unspecified dementia without behavioral disturbance: Secondary | ICD-10-CM | POA: Diagnosis not present

## 2015-08-22 DIAGNOSIS — R55 Syncope and collapse: Secondary | ICD-10-CM

## 2015-08-22 DIAGNOSIS — I10 Essential (primary) hypertension: Secondary | ICD-10-CM | POA: Diagnosis not present

## 2015-08-22 NOTE — Assessment & Plan Note (Signed)
The etiology is unclear. I have recommended insertion of an ILR. This will be scheduled at the earlies possible time.

## 2015-08-22 NOTE — Progress Notes (Signed)
      HPI Gabrielle Hoffman returns today for followup of syncope. She is a pleasant elderly woman with LBBB.  When I last saw her I asked her to undergo a 2D echo which demonstrated an EF of 45%. She wore a cardiac monitor and this demonstrated no arrhythmias and no bradycardia or pauses. She returns today for additional evaluation. She continues to have symptoms of orthostasis. Allergies  Allergen Reactions  . Sulfa Antibiotics Rash  . Sulfamethoxazole Rash     Current Outpatient Prescriptions  Medication Sig Dispense Refill  . acetaminophen (TYLENOL) 325 MG tablet Take 650 mg by mouth every 6 (six) hours as needed. Pain/fever.    Marland Kitchen amLODipine (NORVASC) 5 MG tablet Take 5 mg by mouth daily.    Marland Kitchen lisinopril (PRINIVIL,ZESTRIL) 20 MG tablet Take 20 mg by mouth daily.    . Multiple Vitamin (MULTIVITAMIN WITH MINERALS) TABS tablet Take 1 tablet by mouth daily.     No current facility-administered medications for this visit.     Past Medical History  Diagnosis Date  . UTI (lower urinary tract infection)   . Hypertension   . Mitral valve prolapse   . Hyponatremia 12/17/2011  . DEMENTIA     very mild   . Asthma   . Diverticulosis   . Hyperlipidemia   . Osteopenia   . Vaginal prolapse   . Memory loss   . DDD (degenerative disc disease), lumbar   . Depression   . Disc disorder of cervical region   . Venous insufficiency   . Syncope     ROS:   All systems reviewed and negative except as noted in the HPI.   Past Surgical History  Procedure Laterality Date  . Abdominal hysterectomy    . Tonsillectomy       Family History  Problem Relation Age of Onset  . Cancer Sister      Social History   Social History  . Marital Status: Widowed    Spouse Name: N/A  . Number of Children: N/A  . Years of Education: N/A   Occupational History  . Not on file.   Social History Main Topics  . Smoking status: Never Smoker   . Smokeless tobacco: Never Used  . Alcohol Use: No    . Drug Use: No  . Sexual Activity: Not Currently    Birth Control/ Protection: Post-menopausal   Other Topics Concern  . Not on file   Social History Narrative     BP 120/56 mmHg  Pulse 96  Ht 5\' 6"  (1.676 m)  Wt 138 lb (62.596 kg)  BMI 22.28 kg/m2 Orthostatic vitals demonstrate that her blood pressure goes up from sitting to standing and her HR changed less that 10/min. (no evidence of orthostasis or POTS) Physical Exam:  Elderly appearing 82 yo woman, NAD HEENT: Unremarkable Neck:  6 cm, JVD, no thyromegally Lymphatics:  No adenopathy Back:  No CVA tenderness Lungs:  Clear with no wheezes HEART:  Regular rate rhythm, no murmurs, no rubs, no clicks Abd:  soft, positive bowel sounds, no organomegally, no rebound, no guarding Ext:  2 plus pulses, no edema, no cyanosis, no clubbing Skin:  No rashes no nodules Neuro:  CN II through XII intact, motor grossly intact  EKG - nsr with LBBB, rare PVC's  Assess/Plan:

## 2015-08-22 NOTE — Assessment & Plan Note (Signed)
SHe is pretty good today. Her dementia appears fairly mild.

## 2015-08-22 NOTE — Patient Instructions (Addendum)
Your physician recommends that you continue on your current medications as directed. Please refer to the Current Medication list given to you today. Your physician has recommended you have a loop recorder placed. (LINQ)  There are no restrictions prior to this procedure. Please report to Short Stay at [facility] at 6:30 am on Monday Aug 27, 2015.  Your procedure is scheduled for 7:30 am.  Your physician recommends that you schedule a follow-up appointment in: around 10 days for a wound check. Your physician recommends that you schedule a follow-up appointment in: 3 months with Dr. Lovena Le.

## 2015-08-22 NOTE — Assessment & Plan Note (Signed)
Her blood pressure is well controlled. Will follow.  

## 2015-08-24 DIAGNOSIS — R42 Dizziness and giddiness: Secondary | ICD-10-CM | POA: Diagnosis not present

## 2015-08-24 DIAGNOSIS — M5030 Other cervical disc degeneration, unspecified cervical region: Secondary | ICD-10-CM | POA: Diagnosis not present

## 2015-08-24 DIAGNOSIS — R261 Paralytic gait: Secondary | ICD-10-CM | POA: Diagnosis not present

## 2015-08-24 DIAGNOSIS — M5136 Other intervertebral disc degeneration, lumbar region: Secondary | ICD-10-CM | POA: Diagnosis not present

## 2015-08-24 DIAGNOSIS — R296 Repeated falls: Secondary | ICD-10-CM | POA: Diagnosis not present

## 2015-08-24 DIAGNOSIS — I10 Essential (primary) hypertension: Secondary | ICD-10-CM | POA: Diagnosis not present

## 2015-08-27 ENCOUNTER — Ambulatory Visit (HOSPITAL_COMMUNITY)
Admission: RE | Admit: 2015-08-27 | Discharge: 2015-08-27 | Disposition: A | Discharge status: Home / Self Care | Payer: Medicare Other | Source: Ambulatory Visit | Attending: Internal Medicine | Admitting: Internal Medicine

## 2015-08-27 ENCOUNTER — Encounter (HOSPITAL_COMMUNITY)
Admission: RE | Disposition: A | Discharge status: Home / Self Care | Payer: Self-pay | Source: Ambulatory Visit | Attending: Internal Medicine

## 2015-08-27 ENCOUNTER — Encounter (HOSPITAL_COMMUNITY): Payer: Self-pay | Admitting: Internal Medicine

## 2015-08-27 DIAGNOSIS — I447 Left bundle-branch block, unspecified: Secondary | ICD-10-CM | POA: Insufficient documentation

## 2015-08-27 DIAGNOSIS — J45909 Unspecified asthma, uncomplicated: Secondary | ICD-10-CM | POA: Insufficient documentation

## 2015-08-27 DIAGNOSIS — I341 Nonrheumatic mitral (valve) prolapse: Secondary | ICD-10-CM | POA: Diagnosis not present

## 2015-08-27 DIAGNOSIS — M5136 Other intervertebral disc degeneration, lumbar region: Secondary | ICD-10-CM | POA: Diagnosis not present

## 2015-08-27 DIAGNOSIS — I872 Venous insufficiency (chronic) (peripheral): Secondary | ICD-10-CM | POA: Insufficient documentation

## 2015-08-27 DIAGNOSIS — R55 Syncope and collapse: Secondary | ICD-10-CM | POA: Insufficient documentation

## 2015-08-27 DIAGNOSIS — I10 Essential (primary) hypertension: Secondary | ICD-10-CM | POA: Diagnosis not present

## 2015-08-27 DIAGNOSIS — F329 Major depressive disorder, single episode, unspecified: Secondary | ICD-10-CM | POA: Insufficient documentation

## 2015-08-27 DIAGNOSIS — F0390 Unspecified dementia without behavioral disturbance: Secondary | ICD-10-CM | POA: Insufficient documentation

## 2015-08-27 DIAGNOSIS — E785 Hyperlipidemia, unspecified: Secondary | ICD-10-CM | POA: Diagnosis not present

## 2015-08-27 DIAGNOSIS — M8580 Other specified disorders of bone density and structure, unspecified site: Secondary | ICD-10-CM | POA: Diagnosis not present

## 2015-08-27 HISTORY — PX: EP IMPLANTABLE DEVICE: SHX172B

## 2015-08-27 SURGERY — LOOP RECORDER INSERTION
Anesthesia: LOCAL

## 2015-08-27 MED ORDER — LIDOCAINE-EPINEPHRINE 1 %-1:100000 IJ SOLN
INTRAMUSCULAR | Status: AC
  Filled 2015-08-27: qty 1

## 2015-08-27 MED ORDER — LIDOCAINE-EPINEPHRINE 1 %-1:100000 IJ SOLN
INTRAMUSCULAR | Status: DC | PRN
  Administered 2015-08-27: 20 mL

## 2015-08-27 SURGICAL SUPPLY — 2 items
LOOP REVEAL LINQSYS (Prosthesis & Implant Heart) ×1 IMPLANT
PACK LOOP INSERTION (CUSTOM PROCEDURE TRAY) ×2 IMPLANT

## 2015-08-27 NOTE — Interval H&P Note (Signed)
History and Physical Interval Note:  08/27/2015 2:06 PM  Gabrielle Hoffman  has presented today for surgery, with the diagnosis of syncope  The various methods of treatment have been discussed with the patient and family. After consideration of risks, benefits and other options for treatment, the patient has consented to  Procedure(s): Loop Recorder Insertion (N/A) as a surgical intervention .  The patient's history has been reviewed, patient examined, no change in status, stable for surgery.  I have reviewed the patient's chart and labs.  Questions were answered to the patient's satisfaction.     Cristopher Peru

## 2015-08-27 NOTE — H&P (View-Only) (Signed)
      HPI Mrs. Gabrielle Hoffman returns today for followup of syncope. She is a pleasant elderly woman with LBBB.  When I last saw her I asked her to undergo a 2D echo which demonstrated an EF of 45%. She wore a cardiac monitor and this demonstrated no arrhythmias and no bradycardia or pauses. She returns today for additional evaluation. She continues to have symptoms of orthostasis. Allergies  Allergen Reactions  . Sulfa Antibiotics Rash  . Sulfamethoxazole Rash     Current Outpatient Prescriptions  Medication Sig Dispense Refill  . acetaminophen (TYLENOL) 325 MG tablet Take 650 mg by mouth every 6 (six) hours as needed. Pain/fever.    Marland Kitchen amLODipine (NORVASC) 5 MG tablet Take 5 mg by mouth daily.    Marland Kitchen lisinopril (PRINIVIL,ZESTRIL) 20 MG tablet Take 20 mg by mouth daily.    . Multiple Vitamin (MULTIVITAMIN WITH MINERALS) TABS tablet Take 1 tablet by mouth daily.     No current facility-administered medications for this visit.     Past Medical History  Diagnosis Date  . UTI (lower urinary tract infection)   . Hypertension   . Mitral valve prolapse   . Hyponatremia 12/17/2011  . DEMENTIA     very mild   . Asthma   . Diverticulosis   . Hyperlipidemia   . Osteopenia   . Vaginal prolapse   . Memory loss   . DDD (degenerative disc disease), lumbar   . Depression   . Disc disorder of cervical region   . Venous insufficiency   . Syncope     ROS:   All systems reviewed and negative except as noted in the HPI.   Past Surgical History  Procedure Laterality Date  . Abdominal hysterectomy    . Tonsillectomy       Family History  Problem Relation Age of Onset  . Cancer Sister      Social History   Social History  . Marital Status: Widowed    Spouse Name: N/A  . Number of Children: N/A  . Years of Education: N/A   Occupational History  . Not on file.   Social History Main Topics  . Smoking status: Never Smoker   . Smokeless tobacco: Never Used  . Alcohol Use: No    . Drug Use: No  . Sexual Activity: Not Currently    Birth Control/ Protection: Post-menopausal   Other Topics Concern  . Not on file   Social History Narrative     BP 120/56 mmHg  Pulse 96  Ht 5\' 6"  (1.676 m)  Wt 138 lb (62.596 kg)  BMI 22.28 kg/m2 Orthostatic vitals demonstrate that her blood pressure goes up from sitting to standing and her HR changed less that 10/min. (no evidence of orthostasis or POTS) Physical Exam:  Elderly appearing 82 yo woman, NAD HEENT: Unremarkable Neck:  6 cm, JVD, no thyromegally Lymphatics:  No adenopathy Back:  No CVA tenderness Lungs:  Clear with no wheezes HEART:  Regular rate rhythm, no murmurs, no rubs, no clicks Abd:  soft, positive bowel sounds, no organomegally, no rebound, no guarding Ext:  2 plus pulses, no edema, no cyanosis, no clubbing Skin:  No rashes no nodules Neuro:  CN II through XII intact, motor grossly intact  EKG - nsr with LBBB, rare PVC's  Assess/Plan:

## 2015-08-28 ENCOUNTER — Telehealth: Payer: Self-pay | Admitting: Internal Medicine

## 2015-08-28 ENCOUNTER — Ambulatory Visit (INDEPENDENT_AMBULATORY_CARE_PROVIDER_SITE_OTHER): Payer: Medicare Other | Admitting: *Deleted

## 2015-08-28 DIAGNOSIS — R261 Paralytic gait: Secondary | ICD-10-CM | POA: Diagnosis not present

## 2015-08-28 DIAGNOSIS — M5136 Other intervertebral disc degeneration, lumbar region: Secondary | ICD-10-CM | POA: Diagnosis not present

## 2015-08-28 DIAGNOSIS — Z95818 Presence of other cardiac implants and grafts: Secondary | ICD-10-CM

## 2015-08-28 DIAGNOSIS — I10 Essential (primary) hypertension: Secondary | ICD-10-CM | POA: Diagnosis not present

## 2015-08-28 DIAGNOSIS — M5030 Other cervical disc degeneration, unspecified cervical region: Secondary | ICD-10-CM | POA: Diagnosis not present

## 2015-08-28 DIAGNOSIS — R42 Dizziness and giddiness: Secondary | ICD-10-CM | POA: Diagnosis not present

## 2015-08-28 DIAGNOSIS — R296 Repeated falls: Secondary | ICD-10-CM | POA: Diagnosis not present

## 2015-08-28 NOTE — Progress Notes (Signed)
Pt presents today for bleeding at [facility] insertion site. Gauze saturated with bright red blood. Tegaderm and gauze removed. Site cleaned with gauze- no active bleeding. 3 Steri-strips applied over incision, clean gauze and tegaderm applied also. Advised to call back for repeat appt if gauze become saturated with blood again- spots of blood on gauze are ok. Educated about wound care and activity restrictions. Carelink education given. ROV for wound check 09/06/15. Son and pt agreeable and verbalize understanding.

## 2015-08-28 NOTE — Telephone Encounter (Signed)
New Message    3. Are you experiencing draining or swelling or bleeding at device site? Pt son called states that the wound is bleeding. Please call back to discuss. He states that it is not oozing. But it is bleeding heavily. Please call back to discuss.

## 2015-08-28 NOTE — Telephone Encounter (Signed)
Appt made for today @ 1600 per son request.

## 2015-08-31 DIAGNOSIS — R261 Paralytic gait: Secondary | ICD-10-CM | POA: Diagnosis not present

## 2015-08-31 DIAGNOSIS — M5030 Other cervical disc degeneration, unspecified cervical region: Secondary | ICD-10-CM | POA: Diagnosis not present

## 2015-08-31 DIAGNOSIS — R42 Dizziness and giddiness: Secondary | ICD-10-CM | POA: Diagnosis not present

## 2015-08-31 DIAGNOSIS — R296 Repeated falls: Secondary | ICD-10-CM | POA: Diagnosis not present

## 2015-08-31 DIAGNOSIS — M5136 Other intervertebral disc degeneration, lumbar region: Secondary | ICD-10-CM | POA: Diagnosis not present

## 2015-08-31 DIAGNOSIS — I10 Essential (primary) hypertension: Secondary | ICD-10-CM | POA: Diagnosis not present

## 2015-09-06 ENCOUNTER — Encounter: Payer: Self-pay | Admitting: Internal Medicine

## 2015-09-06 ENCOUNTER — Ambulatory Visit (INDEPENDENT_AMBULATORY_CARE_PROVIDER_SITE_OTHER): Payer: Medicare Other | Admitting: *Deleted

## 2015-09-06 DIAGNOSIS — Z95818 Presence of other cardiac implants and grafts: Secondary | ICD-10-CM

## 2015-09-06 LAB — CUP PACEART INCLINIC DEVICE CHECK: Date Time Interrogation Session: 20170202184033

## 2015-09-06 NOTE — Progress Notes (Signed)
Loop wound check in clinic. Wound without redness, swelling and drainage. Slight scab noted at incision- patient instructed to clean with soap and water and to notify device clinic if the incision does not close when the scab falls off. Battery status: good. R-waves 0.91mV. 1 symptom episode- patient has been experiencing dizziness 5-6 days/week. ECG demonstrates NSR. Son says that she has a medication for dizziness (unsure if it is meclizine) that he gave her once today. I recommended that they utilize the medication as needed and to increase oral fluids (son does not think his mother drinks enough during the day). I encouraged the use of the symptom activator after implementing these changes if dizziness does not improve. They are agreeable. Monthly summary reports and ROV with GT 12/25/15.

## 2015-09-07 DIAGNOSIS — R261 Paralytic gait: Secondary | ICD-10-CM | POA: Diagnosis not present

## 2015-09-07 DIAGNOSIS — M5030 Other cervical disc degeneration, unspecified cervical region: Secondary | ICD-10-CM | POA: Diagnosis not present

## 2015-09-07 DIAGNOSIS — R42 Dizziness and giddiness: Secondary | ICD-10-CM | POA: Diagnosis not present

## 2015-09-07 DIAGNOSIS — R296 Repeated falls: Secondary | ICD-10-CM | POA: Diagnosis not present

## 2015-09-07 DIAGNOSIS — M5136 Other intervertebral disc degeneration, lumbar region: Secondary | ICD-10-CM | POA: Diagnosis not present

## 2015-09-07 DIAGNOSIS — I10 Essential (primary) hypertension: Secondary | ICD-10-CM | POA: Diagnosis not present

## 2015-09-08 ENCOUNTER — Encounter: Payer: Self-pay | Admitting: Internal Medicine

## 2015-09-10 DIAGNOSIS — R42 Dizziness and giddiness: Secondary | ICD-10-CM | POA: Diagnosis not present

## 2015-09-10 DIAGNOSIS — R296 Repeated falls: Secondary | ICD-10-CM | POA: Diagnosis not present

## 2015-09-10 DIAGNOSIS — R261 Paralytic gait: Secondary | ICD-10-CM | POA: Diagnosis not present

## 2015-09-10 DIAGNOSIS — I10 Essential (primary) hypertension: Secondary | ICD-10-CM | POA: Diagnosis not present

## 2015-09-10 DIAGNOSIS — M5030 Other cervical disc degeneration, unspecified cervical region: Secondary | ICD-10-CM | POA: Diagnosis not present

## 2015-09-10 DIAGNOSIS — M5136 Other intervertebral disc degeneration, lumbar region: Secondary | ICD-10-CM | POA: Diagnosis not present

## 2015-09-11 ENCOUNTER — Telehealth: Payer: Self-pay | Admitting: *Deleted

## 2015-09-11 NOTE — Telephone Encounter (Signed)
Called patient regarding pause episode on Carelink transmission from 09/07/15.  Patient states she was asymptomatic.  She is agreeable to calling our office if she has syncopal or pre-syncopal symptoms in the future.  Patient relayed that her son, [name], would like to speak with me but he is on another call.  She is agreeable to disclosing medical information to him.  Gave my name and device clinic number so he can reach me later today.  Patient is appreciative of call and denies additional questions or concerns at this time.

## 2015-09-12 DIAGNOSIS — M5030 Other cervical disc degeneration, unspecified cervical region: Secondary | ICD-10-CM | POA: Diagnosis not present

## 2015-09-12 DIAGNOSIS — R261 Paralytic gait: Secondary | ICD-10-CM | POA: Diagnosis not present

## 2015-09-12 DIAGNOSIS — I10 Essential (primary) hypertension: Secondary | ICD-10-CM | POA: Diagnosis not present

## 2015-09-12 DIAGNOSIS — R296 Repeated falls: Secondary | ICD-10-CM | POA: Diagnosis not present

## 2015-09-12 DIAGNOSIS — M5136 Other intervertebral disc degeneration, lumbar region: Secondary | ICD-10-CM | POA: Diagnosis not present

## 2015-09-12 DIAGNOSIS — R42 Dizziness and giddiness: Secondary | ICD-10-CM | POA: Diagnosis not present

## 2015-09-17 DIAGNOSIS — M5030 Other cervical disc degeneration, unspecified cervical region: Secondary | ICD-10-CM | POA: Diagnosis not present

## 2015-09-17 DIAGNOSIS — I10 Essential (primary) hypertension: Secondary | ICD-10-CM | POA: Diagnosis not present

## 2015-09-17 DIAGNOSIS — M5136 Other intervertebral disc degeneration, lumbar region: Secondary | ICD-10-CM | POA: Diagnosis not present

## 2015-09-17 DIAGNOSIS — R261 Paralytic gait: Secondary | ICD-10-CM | POA: Diagnosis not present

## 2015-09-17 DIAGNOSIS — R296 Repeated falls: Secondary | ICD-10-CM | POA: Diagnosis not present

## 2015-09-17 DIAGNOSIS — R42 Dizziness and giddiness: Secondary | ICD-10-CM | POA: Diagnosis not present

## 2015-09-19 DIAGNOSIS — I10 Essential (primary) hypertension: Secondary | ICD-10-CM | POA: Diagnosis not present

## 2015-09-19 DIAGNOSIS — M5030 Other cervical disc degeneration, unspecified cervical region: Secondary | ICD-10-CM | POA: Diagnosis not present

## 2015-09-19 DIAGNOSIS — M5136 Other intervertebral disc degeneration, lumbar region: Secondary | ICD-10-CM | POA: Diagnosis not present

## 2015-09-19 DIAGNOSIS — R296 Repeated falls: Secondary | ICD-10-CM | POA: Diagnosis not present

## 2015-09-19 DIAGNOSIS — R42 Dizziness and giddiness: Secondary | ICD-10-CM | POA: Diagnosis not present

## 2015-09-19 DIAGNOSIS — R261 Paralytic gait: Secondary | ICD-10-CM | POA: Diagnosis not present

## 2015-09-26 ENCOUNTER — Encounter: Payer: Medicare Other | Admitting: *Deleted

## 2015-09-26 DIAGNOSIS — I10 Essential (primary) hypertension: Secondary | ICD-10-CM | POA: Diagnosis not present

## 2015-09-26 DIAGNOSIS — R261 Paralytic gait: Secondary | ICD-10-CM | POA: Diagnosis not present

## 2015-09-26 DIAGNOSIS — R296 Repeated falls: Secondary | ICD-10-CM | POA: Diagnosis not present

## 2015-09-26 DIAGNOSIS — M5030 Other cervical disc degeneration, unspecified cervical region: Secondary | ICD-10-CM | POA: Diagnosis not present

## 2015-09-26 DIAGNOSIS — R42 Dizziness and giddiness: Secondary | ICD-10-CM | POA: Diagnosis not present

## 2015-09-26 DIAGNOSIS — M5136 Other intervertebral disc degeneration, lumbar region: Secondary | ICD-10-CM | POA: Diagnosis not present

## 2015-09-28 ENCOUNTER — Encounter: Payer: Self-pay | Admitting: Cardiology

## 2015-10-01 DIAGNOSIS — M5030 Other cervical disc degeneration, unspecified cervical region: Secondary | ICD-10-CM | POA: Diagnosis not present

## 2015-10-01 DIAGNOSIS — R42 Dizziness and giddiness: Secondary | ICD-10-CM | POA: Diagnosis not present

## 2015-10-01 DIAGNOSIS — R296 Repeated falls: Secondary | ICD-10-CM | POA: Diagnosis not present

## 2015-10-01 DIAGNOSIS — M5136 Other intervertebral disc degeneration, lumbar region: Secondary | ICD-10-CM | POA: Diagnosis not present

## 2015-10-01 DIAGNOSIS — I10 Essential (primary) hypertension: Secondary | ICD-10-CM | POA: Diagnosis not present

## 2015-10-01 DIAGNOSIS — R261 Paralytic gait: Secondary | ICD-10-CM | POA: Diagnosis not present

## 2015-10-05 ENCOUNTER — Encounter: Payer: Self-pay | Admitting: Cardiology

## 2015-10-05 ENCOUNTER — Encounter: Payer: Self-pay | Admitting: Internal Medicine

## 2015-10-05 ENCOUNTER — Telehealth: Payer: Self-pay | Admitting: Internal Medicine

## 2015-10-05 NOTE — Telephone Encounter (Signed)
Pt received the letter,he says he needs an explanation of it please.

## 2015-10-05 NOTE — Telephone Encounter (Signed)
Attempted to help pt son trouble shoot home monitor. After several unsuccessful attempts instructed pt son to call tech services to get help w/ the home monitor. Call forwarded to device tech RN to talk about a previous episode.

## 2015-10-05 NOTE — Telephone Encounter (Signed)
Spoke with patient's son regarding pause episode on 09/07/15.  Advised that as long as the patient is asymptomatic with episodes, we typically continue to monitor patient via her Carelink monitor (when it is reconnected).  Advised that Dr. Lovena Le reviews all episodes that are transmitted.  Patient's son is concerned that if patient has had any episodes in the past 2-3 weeks, they will go unseen.  Advised that her loop recorder would still have recorded episodes for review when the transmitter is connected again.  He states that she has not had any presyncopal or syncopal episodes since she received her device.  I requested that if she does have any symptom episodes, that they use her symptom activator and call our office to make [place] aware.  Patient's son is appreciative of call and explanation.  He states that he will call Carelink tech services for assistance with reconnecting her monitor.  Patient's son denies additional questions or concerns at this time.

## 2015-10-11 ENCOUNTER — Telehealth: Payer: Self-pay | Admitting: Internal Medicine

## 2015-10-11 NOTE — Telephone Encounter (Addendum)
Informed son that pt's monitor is working as of today. I explained to him that we will contact them if it becomes disconnected again. Son voiced understanding.

## 2015-10-11 NOTE — Telephone Encounter (Signed)
New message      Pt has a loop recorder.  She received a letter stating that she needed to send in a transmission.  Son thought that this was taken care of.  Please call and let him know if she needs to send in a transmission or disregard the letter.  Ok to leave vm

## 2015-10-26 ENCOUNTER — Ambulatory Visit (INDEPENDENT_AMBULATORY_CARE_PROVIDER_SITE_OTHER): Payer: Medicare Other | Admitting: *Deleted

## 2015-10-26 DIAGNOSIS — R55 Syncope and collapse: Secondary | ICD-10-CM

## 2015-10-29 NOTE — Progress Notes (Signed)
Carelink Summary Report / Loop Recorder 

## 2015-11-18 ENCOUNTER — Observation Stay (HOSPITAL_COMMUNITY): Payer: Medicare Other

## 2015-11-18 ENCOUNTER — Inpatient Hospital Stay (HOSPITAL_COMMUNITY)
Admission: EM | Admit: 2015-11-18 | Discharge: 2015-11-20 | DRG: 243 | Disposition: A | Discharge status: Home / Self Care | Payer: Medicare Other | Attending: Internal Medicine | Admitting: Internal Medicine

## 2015-11-18 ENCOUNTER — Encounter (HOSPITAL_COMMUNITY): Payer: Self-pay | Admitting: Emergency Medicine

## 2015-11-18 DIAGNOSIS — I455 Other specified heart block: Secondary | ICD-10-CM | POA: Diagnosis not present

## 2015-11-18 DIAGNOSIS — I442 Atrioventricular block, complete: Principal | ICD-10-CM | POA: Diagnosis present

## 2015-11-18 DIAGNOSIS — R4189 Other symptoms and signs involving cognitive functions and awareness: Secondary | ICD-10-CM

## 2015-11-18 DIAGNOSIS — I110 Hypertensive heart disease with heart failure: Secondary | ICD-10-CM | POA: Diagnosis not present

## 2015-11-18 DIAGNOSIS — E872 Acidosis: Secondary | ICD-10-CM | POA: Diagnosis not present

## 2015-11-18 DIAGNOSIS — I5022 Chronic systolic (congestive) heart failure: Secondary | ICD-10-CM | POA: Insufficient documentation

## 2015-11-18 DIAGNOSIS — I255 Ischemic cardiomyopathy: Secondary | ICD-10-CM | POA: Diagnosis present

## 2015-11-18 DIAGNOSIS — I10 Essential (primary) hypertension: Secondary | ICD-10-CM

## 2015-11-18 DIAGNOSIS — Z882 Allergy status to sulfonamides status: Secondary | ICD-10-CM

## 2015-11-18 DIAGNOSIS — R140 Abdominal distension (gaseous): Secondary | ICD-10-CM | POA: Diagnosis not present

## 2015-11-18 DIAGNOSIS — E861 Hypovolemia: Secondary | ICD-10-CM | POA: Diagnosis present

## 2015-11-18 DIAGNOSIS — I341 Nonrheumatic mitral (valve) prolapse: Secondary | ICD-10-CM | POA: Diagnosis present

## 2015-11-18 DIAGNOSIS — E860 Dehydration: Secondary | ICD-10-CM | POA: Diagnosis not present

## 2015-11-18 DIAGNOSIS — R55 Syncope and collapse: Secondary | ICD-10-CM | POA: Diagnosis present

## 2015-11-18 DIAGNOSIS — N390 Urinary tract infection, site not specified: Secondary | ICD-10-CM | POA: Diagnosis not present

## 2015-11-18 DIAGNOSIS — I498 Other specified cardiac arrhythmias: Secondary | ICD-10-CM | POA: Diagnosis not present

## 2015-11-18 DIAGNOSIS — Z950 Presence of cardiac pacemaker: Secondary | ICD-10-CM

## 2015-11-18 DIAGNOSIS — E785 Hyperlipidemia, unspecified: Secondary | ICD-10-CM | POA: Diagnosis present

## 2015-11-18 DIAGNOSIS — I447 Left bundle-branch block, unspecified: Secondary | ICD-10-CM | POA: Diagnosis present

## 2015-11-18 DIAGNOSIS — I499 Cardiac arrhythmia, unspecified: Secondary | ICD-10-CM

## 2015-11-18 HISTORY — DX: Pneumonia, unspecified organism: J18.9

## 2015-11-18 HISTORY — DX: Presence of cardiac pacemaker: Z95.0

## 2015-11-18 HISTORY — DX: Calculus of kidney: N20.0

## 2015-11-18 LAB — URINALYSIS, ROUTINE W REFLEX MICROSCOPIC
BILIRUBIN URINE: NEGATIVE
Glucose, UA: NEGATIVE mg/dL
HGB URINE DIPSTICK: NEGATIVE
Ketones, ur: NEGATIVE mg/dL
NITRITE: POSITIVE — AB
PH: 7 (ref 5.0–8.0)
Protein, ur: NEGATIVE mg/dL
SPECIFIC GRAVITY, URINE: 1.008 (ref 1.005–1.030)

## 2015-11-18 LAB — CBC
HEMATOCRIT: 37.8 % (ref 36.0–46.0)
HEMOGLOBIN: 12.5 g/dL (ref 12.0–15.0)
MCH: 29.8 pg (ref 26.0–34.0)
MCHC: 33.1 g/dL (ref 30.0–36.0)
MCV: 90 fL (ref 78.0–100.0)
Platelets: 295 10*3/uL (ref 150–400)
RBC: 4.2 MIL/uL (ref 3.87–5.11)
RDW: 14.3 % (ref 11.5–15.5)
WBC: 7.1 10*3/uL (ref 4.0–10.5)

## 2015-11-18 LAB — BASIC METABOLIC PANEL
ANION GAP: 11 (ref 5–15)
BUN: 12 mg/dL (ref 6–20)
CALCIUM: 9.6 mg/dL (ref 8.9–10.3)
CO2: 23 mmol/L (ref 22–32)
Chloride: 99 mmol/L — ABNORMAL LOW (ref 101–111)
Creatinine, Ser: 0.74 mg/dL (ref 0.44–1.00)
GFR calc non Af Amer: >60 mL/min (ref >60)
GLUCOSE: 116 mg/dL — AB (ref 65–99)
POTASSIUM: 4.2 mmol/L (ref 3.5–5.1)
Sodium: 133 mmol/L — ABNORMAL LOW (ref 135–145)

## 2015-11-18 LAB — URINE MICROSCOPIC-ADD ON

## 2015-11-18 LAB — I-STAT CG4 LACTIC ACID, ED: LACTIC ACID, VENOUS: 2.04 mmol/L — AB (ref 0.5–2.0)

## 2015-11-18 LAB — CBG MONITORING, ED: GLUCOSE-CAPILLARY: 99 mg/dL (ref 65–99)

## 2015-11-18 MED ORDER — ACETAMINOPHEN 325 MG PO TABS: 650.0000 mg | ORAL_TABLET | Freq: Four times a day (QID) | ORAL | Status: DC | PRN

## 2015-11-18 MED ORDER — SODIUM CHLORIDE 0.9 % IV BOLUS (SEPSIS)
1000.0000 mL | Freq: Once | INTRAVENOUS | Status: AC
  Administered 2015-11-18: 1000 mL via INTRAVENOUS

## 2015-11-18 MED ORDER — SODIUM CHLORIDE 0.9% FLUSH
3.0000 mL | Freq: Two times a day (BID) | INTRAVENOUS | Status: DC
  Administered 2015-11-19 – 2015-11-20 (×3): 3 mL via INTRAVENOUS

## 2015-11-18 MED ORDER — DEXTROSE 5 % IV SOLN
1.0000 g | INTRAVENOUS | Status: DC
  Administered 2015-11-19: 1 g via INTRAVENOUS
  Filled 2015-11-18 (×2): qty 10

## 2015-11-18 MED ORDER — DEXTROSE 5 % IV SOLN
1.0000 g | Freq: Once | INTRAVENOUS | Status: AC
  Administered 2015-11-18: 1 g via INTRAVENOUS
  Filled 2015-11-18: qty 10

## 2015-11-18 MED ORDER — SODIUM CHLORIDE 0.9 % IV SOLN
INTRAVENOUS | Status: DC
  Administered 2015-11-18: via INTRAVENOUS

## 2015-11-18 MED ORDER — ADULT MULTIVITAMIN W/MINERALS CH
1.0000 | ORAL_TABLET | Freq: Every day | ORAL | Status: DC
  Administered 2015-11-19 – 2015-11-20 (×2): 1 via ORAL
  Filled 2015-11-18 (×2): qty 1

## 2015-11-18 MED ORDER — TIMOLOL MALEATE 0.25 % OP SOLN
1.0000 [drp] | Freq: Two times a day (BID) | OPHTHALMIC | Status: DC
  Administered 2015-11-18 – 2015-11-20 (×3): 1 [drp] via OPHTHALMIC
  Filled 2015-11-18: qty 5

## 2015-11-18 MED ORDER — MECLIZINE HCL 12.5 MG PO TABS: 12.5000 mg | ORAL_TABLET | Freq: Two times a day (BID) | ORAL | Status: DC | PRN

## 2015-11-18 MED ORDER — LISINOPRIL 10 MG PO TABS
20.0000 mg | ORAL_TABLET | Freq: Every day | ORAL | Status: DC
  Administered 2015-11-19 – 2015-11-20 (×2): 20 mg via ORAL
  Filled 2015-11-18 (×2): qty 2

## 2015-11-18 MED ORDER — AMLODIPINE BESYLATE 5 MG PO TABS
5.0000 mg | ORAL_TABLET | Freq: Every day | ORAL | Status: DC
  Administered 2015-11-19 – 2015-11-20 (×2): 5 mg via ORAL
  Filled 2015-11-18 (×2): qty 1

## 2015-11-18 NOTE — Consult Note (Signed)
CARDIOLOGY INPATIENT CONSULTATION NOTE  Patient ID: Gabrielle Hoffman MRN: [id number], DOB/AGE: Nov 01, 1930   Admit date: 11/18/2015   Primary Physician: Mathews Argyle, MD Primary Cardiologist: Cristopher Peru MD  Reason for Consult:   Symptomatic bradycardia  Requesting Physician: Dr. Billy Fischer  HPI: This is a 82 y.o.white female with history of hyperlipidemia, hypertension, mild memory loss, recurrent UTI, ischemic cardiomyopathy (LVEF 45%), and chronic LBBB who was seen by Dr. Lovena Le in the past for syncope and had a loop monitor placed in 08/2015.  Patient passed out 4 months ago for the first time and then she had another episode today. She was at a shopping center today and was doing well while on the wheel chair however when she was getting ready to leave the shopping center, her son took her to the car where she stood up from her wheel chair and then sat down in the car. She fastened her seat belt and then she collapsed. She does not recall any symptoms prior to the episode. She stayed passed out for few seconds and then regained consciousness. On arrival to the ER, her loop recorder was interrogated which showed 7 s pause. She is being admitted for UTI infection treatment to [facility] and cardiology was consulted for possible PPM placement. She is not orthostatic and currently not symptomatic. Her BP has been normal and not low. She has been well hydrated.   Problem List: Past Medical History  Diagnosis Date  . UTI (lower urinary tract infection)   . Hypertension   . Mitral valve prolapse   . Hyponatremia 12/17/2011  . DEMENTIA     very mild   . Asthma   . Diverticulosis   . Hyperlipidemia   . Osteopenia   . Vaginal prolapse   . Memory loss   . DDD (degenerative disc disease), lumbar   . Depression   . Disc disorder of cervical region   . Venous insufficiency   . Syncope     Past Surgical History  Procedure Laterality Date  . Abdominal hysterectomy    .  Tonsillectomy    . Ep implantable device N/A 08/27/2015    Procedure: Loop Recorder Insertion;  Surgeon: Evans Lance, MD;  Location: [place] [facility];  Service: Cardiovascular;  Laterality: N/A;     Allergies:  Allergies  Allergen Reactions  . Sulfa Antibiotics Rash  . Sulfamethoxazole Rash     Home Medications No current facility-administered medications for this encounter.   Current Outpatient Prescriptions  Medication Sig Dispense Refill  . acetaminophen (TYLENOL) 325 MG tablet Take 650 mg by mouth every 6 (six) hours as needed for mild pain, moderate pain or fever.     . meclizine (ANTIVERT) 12.5 MG tablet Take 12.5 mg by mouth 2 (two) times daily as needed for dizziness.     . timolol (TIMOPTIC) 0.25 % ophthalmic solution Place 1 drop into both eyes 2 (two) times daily.     Marland Kitchen amLODipine (NORVASC) 5 MG tablet Take 5 mg by mouth daily.    Marland Kitchen lisinopril (PRINIVIL,ZESTRIL) 20 MG tablet Take 20 mg by mouth daily.    . Multiple Vitamin (MULTIVITAMIN WITH MINERALS) TABS tablet Take 1 tablet by mouth daily.       Family History  Problem Relation Age of Onset  . Cancer Sister      Social History   Social History  . Marital Status: Widowed    Spouse Name: N/A  . Number of Children: N/A  . Years  of Education: N/A   Occupational History  . Not on file.   Social History Main Topics  . Smoking status: Never Smoker   . Smokeless tobacco: Never Used  . Alcohol Use: No  . Drug Use: No  . Sexual Activity: Not Currently    Birth Control/ Protection: Post-menopausal   Other Topics Concern  . Not on file   Social History Narrative     Review of Systems: General: fatigue increase weight negative for chills, fever, night sweats  Cardiovascular: syncope but no chest pain, sob, dizziness, presyncope Dermatological: negative for rash Respiratory: negative for wheezing  Urologic: negative for hematuria Abdominal: negative for nausea, vomiting, diarrhea, bright red blood  per rectum, melena, or hematemesis Neurologic: negative for visual changes, syncope, or dizziness Hematology: mild anemia Psychiatry: non suicidal/homicidal  Musculoskeletal: back pain   Physical Exam: Vitals: BP 139/57 mmHg  Pulse 95  Temp(Src) 99.2 F (37.3 C) (Oral)  Resp 13  SpO2 97% General: not in acute distress Neck: JVP flat, neck supple Heart: regular rate and rhythm, S1, S2, no murmurs  Lungs: CTAB  GI: non tender, non distended, bowel sounds present Extremities: no edema Neuro: AAO x 3  Psych: normal affect, no anxiety   Labs:   Results for orders placed or performed during the hospital encounter of 11/18/15 (from the past 24 hour(s))  Basic metabolic panel     Status: Abnormal   Collection Time: 11/18/15  5:32 PM  Result Value Ref Range   Sodium 133 (L) 135 - 145 mmol/L   Potassium 4.2 3.5 - 5.1 mmol/L   Chloride 99 (L) 101 - 111 mmol/L   CO2 23 22 - 32 mmol/L   Glucose, Bld 116 (H) 65 - 99 mg/dL   BUN 12 6 - 20 mg/dL   Creatinine, Ser 0.74 0.44 - 1.00 mg/dL   Calcium 9.6 8.9 - 10.3 mg/dL   GFR calc non Af Amer >60 >60 mL/min   GFR calc Af Amer >60 >60 mL/min   Anion gap 11 5 - 15  CBC     Status: None   Collection Time: 11/18/15  5:32 PM  Result Value Ref Range   WBC 7.1 4.0 - 10.5 K/uL   RBC 4.20 3.87 - 5.11 MIL/uL   Hemoglobin 12.5 12.0 - 15.0 g/dL   HCT 37.8 36.0 - 46.0 %   MCV 90.0 78.0 - 100.0 fL   MCH 29.8 26.0 - 34.0 pg   MCHC 33.1 30.0 - 36.0 g/dL   RDW 14.3 11.5 - 15.5 %   Platelets 295 150 - 400 K/uL  CBG monitoring, ED     Status: None   Collection Time: 11/18/15  6:01 PM  Result Value Ref Range   Glucose-Capillary 99 65 - 99 mg/dL  Urinalysis, Routine w reflex microscopic (not at [facility])     Status: Abnormal   Collection Time: 11/18/15  6:49 PM  Result Value Ref Range   Color, Urine YELLOW YELLOW   APPearance CLEAR CLEAR   Specific Gravity, Urine 1.008 1.005 - 1.030   pH 7.0 5.0 - 8.0   Glucose, UA NEGATIVE NEGATIVE mg/dL   Hgb  urine dipstick NEGATIVE NEGATIVE   Bilirubin Urine NEGATIVE NEGATIVE   Ketones, ur NEGATIVE NEGATIVE mg/dL   Protein, ur NEGATIVE NEGATIVE mg/dL   Nitrite POSITIVE (A) NEGATIVE   Leukocytes, UA MODERATE (A) NEGATIVE  Urine microscopic-add on     Status: Abnormal   Collection Time: 11/18/15  6:49 PM  Result Value Ref  Range   Squamous Epithelial / LPF 0-5 (A) NONE SEEN   WBC, UA 6-30 0 - 5 WBC/hpf   RBC / HPF 0-5 0 - 5 RBC/hpf   Bacteria, UA MANY (A) NONE SEEN     Radiology/Studies: No results found.  EKG: sinus tachycardia vent rate 116, LBBB  Echo: 07/26/2015 - Left ventricle: The cavity size was normal. There was mild focal  basal hypertrophy of the septum. Systolic function was mildly  reduced, primarily due to systolic dyssynchrony. The estimated  ejection fraction was in the range of 45% to 50%. Wall motion was  normal; there were no regional wall motion abnormalities. There  was fusion of early and atrial contributions to ventricular  filling. The study is not technically sufficient to allow  evaluation of LV diastolic function. - Ventricular septum: Septal motion showed abnormal function,  dyssynergy, and paradox. These changes are consistent with a left  bundle branch block. - Aortic valve: There was trivial regurgitation. - Mitral valve: Calcified annulus.  Cardiac cath: not available  Medical decision making:  Discussed care with the patient Discussed care with the physician on the phone Reviewed labs and imaging personally Reviewed prior records  ASSESSMENT AND PLAN:  This is a 82 y.o.white female with history of hyperlipidemia, hypertension, mild memory loss, recurrent UTI, ischemic cardiomyopathy (LVEF 45%), and chronic LBBB who was seen by Dr. Lovena Le in the past for syncope and had a loop monitor placed in 08/2015.    Principal Problem:   Syncope Active Problems:   Dehydration   UTI (lower urinary tract infection)   HTN (hypertension)    Cognitive change   Arrhythmia   Syncope and collapse  Syncope with 7s pause Check TSH, hold any AV nodal blocking agents, Keep potassium >4, calcium >9, magnesium >2 EP consult in the AM for planning for PPM (evidence level: IIa by AHA guidelines)  Chronic systolic dysfunction - hold beta blocker, continue lisinopril, check BNP to detect subclinical congestion and need for diuresis  Signed, Flossie Dibble, MD MS 11/18/2015, 10:34 PM

## 2015-11-18 NOTE — ED Notes (Signed)
Per [employer], pt had a 7 second pause on loop recorder at 1603

## 2015-11-18 NOTE — ED Provider Notes (Signed)
CSN: [id number]     Arrival date & time 11/18/15  1724 History   First MD Initiated Contact with Patient 11/18/15 1810     Chief Complaint  Patient presents with  . Loss of Consciousness     (Consider location/radiation/quality/duration/timing/severity/associated sxs/prior Treatment) HPI Comments: [place] [facility] [place], was in wheelchair In car slumped over, passed out, 5 seconds 4 months ago had another episode of syncope, fell hit head--that's when referred to heart doctor, had holter and then loop recorder No warning, no lightheadedness, no chest pain, no shortness of breath Not eating or drinking well for months Patient has been urinating frequently Hx of LBBB, loop recorder in place now Not feeling well for last 2 weeks, low energy, not feeling like doing much, pt reports for last few days son reports 2 wks, for last few days has been having urinary frequency    Past Medical History  Diagnosis Date  . UTI (lower urinary tract infection)   . Hypertension   . Mitral valve prolapse   . Hyponatremia 12/17/2011  . DEMENTIA     very mild   . Asthma   . Diverticulosis   . Hyperlipidemia   . Osteopenia   . Vaginal prolapse   . Memory loss   . DDD (degenerative disc disease), lumbar   . Depression   . Disc disorder of cervical region   . Venous insufficiency   . Syncope    Past Surgical History  Procedure Laterality Date  . Abdominal hysterectomy    . Tonsillectomy    . Ep implantable device N/A 08/27/2015    Procedure: Loop Recorder Insertion;  Surgeon: Evans Lance, MD;  Location: [place] [facility];  Service: Cardiovascular;  Laterality: N/A;   Family History  Problem Relation Age of Onset  . Cancer Sister    Social History  Substance Use Topics  . Smoking status: Never Smoker   . Smokeless tobacco: Never Used  . Alcohol Use: No   OB History    No data available     Review of Systems  Constitutional: Positive for appetite change and fatigue.  Negative for fever.  HENT: Negative for sore throat.   Eyes: Negative for visual disturbance.  Respiratory: Negative for cough and shortness of breath.   Cardiovascular: Negative for chest pain.  Gastrointestinal: Negative for nausea, vomiting, abdominal pain, diarrhea and blood in stool.  Genitourinary: Positive for frequency. Negative for difficulty urinating.  Musculoskeletal: Negative for back pain and neck pain.  Skin: Negative for rash.  Neurological: Positive for syncope. Negative for seizures, facial asymmetry, weakness, light-headedness, numbness and headaches.      Allergies  Sulfa antibiotics and Sulfamethoxazole  Home Medications   Prior to Admission medications   Medication Sig Start Date End Date Taking? Authorizing Provider  acetaminophen (TYLENOL) 325 MG tablet Take 650 mg by mouth every 6 (six) hours as needed for mild pain, moderate pain or fever.    Yes Historical Provider, MD  meclizine (ANTIVERT) 12.5 MG tablet Take 12.5 mg by mouth 2 (two) times daily as needed for dizziness.  09/07/15  Yes Historical Provider, MD  timolol (TIMOPTIC) 0.25 % ophthalmic solution Place 1 drop into both eyes 2 (two) times daily.  10/17/15  Yes Historical Provider, MD  amLODipine (NORVASC) 5 MG tablet Take 5 mg by mouth daily.    Historical Provider, MD  lisinopril (PRINIVIL,ZESTRIL) 20 MG tablet Take 20 mg by mouth daily.    Historical Provider, MD  Multiple  Vitamin (MULTIVITAMIN WITH MINERALS) TABS tablet Take 1 tablet by mouth daily.    Historical Provider, MD   BP 150/58 mmHg  Pulse 99  Temp(Src) 97.5 F (36.4 C) (Oral)  Resp 16  Ht 5\' 4"  (1.626 m)  Wt 128 lb 12 oz (58.4 kg)  BMI 22.09 kg/m2  SpO2 99% Physical Exam  Constitutional: She is oriented to person, place, and time. She appears well-developed and well-nourished. No distress.  HENT:  Head: Normocephalic and atraumatic.  Eyes: Conjunctivae and EOM are normal.  Neck: Normal range of motion.  Cardiovascular: Normal  rate, regular rhythm, normal heart sounds and intact distal pulses.  Exam reveals no gallop and no friction rub.   No murmur heard. Pulmonary/Chest: Effort normal and breath sounds normal. No respiratory distress. She has no wheezes. She has no rales.  Abdominal: Soft. She exhibits no distension. There is no tenderness. There is no guarding.  Musculoskeletal: She exhibits no edema or tenderness.  Neurological: She is alert and oriented to person, place, and time. She has normal strength. No cranial nerve deficit or sensory deficit. Coordination normal. Abnormal gait: no truncal ataxia, gait deferred. GCS eye subscore is 4. GCS verbal subscore is 5. GCS motor subscore is 6.  Skin: Skin is warm and dry. No rash noted. She is not diaphoretic. No erythema.  Mild facial flushing  Nursing note and vitals reviewed.   ED Course  Procedures (including critical care time) Labs Review Labs Reviewed  BASIC METABOLIC PANEL - Abnormal; Notable for the following:    Sodium 133 (*)    Chloride 99 (*)    Glucose, Bld 116 (*)    All other components within normal limits  URINALYSIS, ROUTINE W REFLEX MICROSCOPIC (NOT AT [facility]) - Abnormal; Notable for the following:    Nitrite POSITIVE (*)    Leukocytes, UA MODERATE (*)    All other components within normal limits  URINE MICROSCOPIC-ADD ON - Abnormal; Notable for the following:    Squamous Epithelial / LPF 0-5 (*)    Bacteria, UA MANY (*)    All other components within normal limits  I-STAT CG4 LACTIC ACID, ED - Abnormal; Notable for the following:    Lactic Acid, Venous 2.04 (*)    All other components within normal limits  CULTURE, BLOOD (ROUTINE X 2)  CULTURE, BLOOD (ROUTINE X 2)  CBC  TSH  BASIC METABOLIC PANEL  BRAIN NATRIURETIC PEPTIDE  CBG MONITORING, ED  I-STAT CG4 LACTIC ACID, ED    Imaging Review Dg Abd Portable 1v  11/19/2015  CLINICAL DATA:  Abdominal distention. EXAM: PORTABLE ABDOMEN - 1 VIEW COMPARISON:  CT abdomen/ pelvis  12/18/2011 FINDINGS: Normal bowel gas pattern without dilated bowel loops to suggest obstruction. Air in stool throughout the colon, small to moderate stool burden. No evidence of free air. Rounded hyperdensities in the left upper quadrant likely retained barium within colonic diverticula. There is scoliotic curvature of the spine. IMPRESSION: Normal bowel gas pattern.  Small to moderate stool burden. Electronically Signed   By: Jeb Levering M.D.   On: 11/19/2015 03:11   I have personally reviewed and evaluated these images and lab results as part of my medical decision-making.   EKG Interpretation   Date/Time:  Sunday November 18 2015 17:30:02 EDT Ventricular Rate:  116 PR Interval:  162 QRS Duration: 148 QT Interval:  358 QTC Calculation: 497 R Axis:   14 Text Interpretation:  Sinus tachycardia Left bundle branch block Abnormal  ECG Rate increased, otherwise no  significant change Confirmed by  [facility] MD, Romey Cohea ([id number]) on 11/19/2015 3:10:28 AM      MDM   Final diagnoses:  Syncope and collapse  Urinary tract infection without hematuria, site unspecified  Sinus pause   82 year old female with a history of diverticulosis, mitral valve prolapse, mild dementia, prior syncopal episode for which a loop recorder has been placed with Dr. Lovena Le, presents with concern for episode of syncope as well as urinary frequency. Urinalysis is concerning for UTI. Patient was given 2 L of normal saline, and IV Rocephin. No history to suggest seizures or CVA. Patient does appear to have dehydration, as well as the urinary tract infection, which may contribute to syncopal episode, however did not have any lightheadedness or prodrome. A loop recorder was interrogated which was concerning for a 7 second pause.   Consulted cardiology who will consult on the patient, and and recommended hospitalist admission. Hospitalist was consulted for admission and treatment of urinary tract infection. Blood cx drawn. VS  stable, tachycardia improved with IV fluids.     Gareth Morgan, MD 11/19/15 [phone]

## 2015-11-18 NOTE — ED Notes (Signed)
Charge notified to interrogate loop recorder.

## 2015-11-18 NOTE — ED Notes (Signed)
[phone] Please call Gabrielle Hoffman with UPDATES

## 2015-11-18 NOTE — ED Notes (Signed)
Pt. Assisted to bedside commode.

## 2015-11-18 NOTE — H&P (Signed)
[facility] History and Physical  Gabrielle Hoffman [id number] DOB: 10-07-1930 DOA: 11/18/2015  PCP: Mathews Argyle, MD  Specialists: Dr. Cristopher Peru, Cardiology  Chief Complaint: Syncope  HPI: Gabrielle Hoffman is a 82 y.o. woman with a history of HTN, HLD, early cognitive impairment, and recurrent syncopal episodes S/P implanted loop recorder in January who [place] [facility] [place].  She was in a wheelchair most of the afternoon and only stood up outside for about five minutes (while waiting for the car at the end of the visit).  The patient's son was helping her into the backseat when she had a witnessed syncopal episode.  She slumped over in the backseat and was unresponsive for approximately five seconds.  No stigmata of seizure activity.  No confusion.  No preceding symptoms.  The family had the patient in [facility]'s ED in the next 15 minutes.  ED evaluation shows mild dehydration with evidence of a UTI (U/A shows leukocytes, nitrite positive, numerous WBCs).  Loop recorder interrogation revealed a 7 second pause, presumed to correlate with the patient's symptoms.  The patient has been seen and examined by cardiology. Hospitalist asked to admit.  The patient has had urinary frequency, but she denies hematuria, dysuria, or odor.  No documented fever.  Review of Systems: 12 systems reviewed and negative except as stated in the HPI.  Past Medical History  Diagnosis Date  . UTI (lower urinary tract infection)   . Hypertension   . Mitral valve prolapse   . Hyponatremia 12/17/2011  . DEMENTIA     very mild   . Asthma   . Diverticulosis   . Hyperlipidemia   . Osteopenia   . Vaginal prolapse   . Memory loss   . DDD (degenerative disc disease), lumbar   . Depression   . Disc disorder of cervical region   . Venous insufficiency   . Syncope    Past Surgical History  Procedure Laterality Date  . Abdominal hysterectomy    . Tonsillectomy    . Ep  implantable device N/A 08/27/2015    Procedure: Loop Recorder Insertion;  Surgeon: Evans Lance, MD;  Location: [place] [facility];  Service: Cardiovascular;  Laterality: N/A;   Social History:  Social History   Social History Narrative  No tobacco, EtOH, or illicit drug use.  She is a widow. She has one son.  Allergies  Allergen Reactions  . Sulfa Antibiotics Rash  . Sulfamethoxazole Rash    Family History  Problem Relation Age of Onset  . Cancer Sister    Prior to Admission medications   Medication Sig Start Date End Date Taking? Authorizing Provider  acetaminophen (TYLENOL) 325 MG tablet Take 650 mg by mouth every 6 (six) hours as needed for mild pain, moderate pain or fever.    Yes Historical Provider, MD  meclizine (ANTIVERT) 12.5 MG tablet Take 12.5 mg by mouth 2 (two) times daily as needed for dizziness.  09/07/15  Yes Historical Provider, MD  timolol (TIMOPTIC) 0.25 % ophthalmic solution Place 1 drop into both eyes 2 (two) times daily.  10/17/15  Yes Historical Provider, MD  amLODipine (NORVASC) 5 MG tablet Take 5 mg by mouth daily.    Historical Provider, MD  lisinopril (PRINIVIL,ZESTRIL) 20 MG tablet Take 20 mg by mouth daily.    Historical Provider, MD  Multiple Vitamin (MULTIVITAMIN WITH MINERALS) TABS tablet Take 1 tablet by mouth daily.    Historical Provider, MD  Physical Exam: Filed Vitals:   11/18/15 2115 11/18/15 2130 11/18/15 2200 11/18/15 2245  BP: 131/56 140/56 139/57 138/56  Pulse: 96 98 95 96  Temp:      TempSrc:      Resp: 12 15 13 13   SpO2: 95% 98% 97% 98%    General:  Awake and alert.  Oriented to person, place, time and situation.  NAD. Head: [place]/AT Eyes: PERRL bilaterally, conjunctiva are pink.  EOMI. ENT: Mucous membranes are moist.  No nasal drainage.  Neck is supple. Cardiovascular: NR/RR.  Trace LE edema.  Respiratory: CTA bilaterally. GI: Abdomen is distended but soft and compressible, nontender.  Bowel sounds are hypoactive. Skin: Warm and  dry. Musculoskeletal: Moves all four extremities spontaneously. Psychiatric: Normal affect. Neurologic: No focal deficits.  Labs on Admission:  Basic Metabolic Panel:  Recent Labs Lab 11/18/15 1732  NA 133*  K 4.2  CL 99*  CO2 23  GLUCOSE 116*  BUN 12  CREATININE 0.74  CALCIUM 9.6   CBC:  Recent Labs Lab 11/18/15 1732  WBC 7.1  HGB 12.5  HCT 37.8  MCV 90.0  PLT 295   CBG:  Recent Labs Lab 11/18/15 1801  GLUCAP 99   EKG: Independently reviewed. NSR.  Chronic LBBB, previously identified.  Assessment/Plan Principal Problem:   Syncope Active Problems:   Dehydration   UTI (lower urinary tract infection)   HTN (hypertension)   Cognitive change   Arrhythmia   Syncope and collapse  Admit to telemetry  Syncope with 7 second pause on loop recorder --Cardiology consult appreciated; defer management of arrhythmia to specialist  UTI, she is not overtly septic --Blood and urine cultures are pending --Continue IV Rocephin for now --lactic acid level pending  Mild dehydration --NS at 75cc/hr --Repeat BMP in the AM  Code Status: FULL Family Communication: Patient's son present at time of admission Disposition Plan: To be determined based on cardiology recommendations  Time spent: 35 minutes  [employer] [facility]  11/18/2015, 11:07 PM

## 2015-11-18 NOTE — ED Notes (Addendum)
[facility] she had a syncopal episode lasting about 5 seconds when the son was getting her into the car. Pt had syncopal episode 1 year ago and had a loop recorder.  Pt complained of not feeling well all week. Pt has not been eating and drinking enough. Pt also has been having increase urination. Son is sole caregiver of patient and has multiple health issues at present.

## 2015-11-18 NOTE — Progress Notes (Signed)
Pharmacy Antibiotic Note  Gabrielle Hoffman is a 82 y.o. female admitted on 11/18/2015 with UTI.  Pharmacy has been consulted for Ceftriaxone dosing. WBC WNL. Lactic acid elevated. U/A nitrite + with bacteria present.   Plan: -Ceftriaxone 1g IV q24h -F/U urine culture for directed therapy  Height: 5\' 4"  (162.6 cm) Weight: 128 lb 12 oz (58.4 kg) IBW/kg (Calculated) : 54.7  Temp (24hrs), Avg:98.4 F (36.9 C), Min:97.5 F (36.4 C), Max:99.2 F (37.3 C)   Recent Labs Lab 11/18/15 1732 11/18/15 2326  WBC 7.1  --   CREATININE 0.74  --   LATICACIDVEN  --  2.04*    Estimated Creatinine Clearance: 46.8 mL/min (by C-G formula based on Cr of 0.74).    Allergies  Allergen Reactions  . Sulfa Antibiotics Rash  . Sulfamethoxazole Rash    Gabrielle Hoffman 11/18/2015 11:46 PM

## 2015-11-19 ENCOUNTER — Encounter (HOSPITAL_COMMUNITY): Payer: Self-pay | Admitting: General Practice

## 2015-11-19 ENCOUNTER — Encounter (HOSPITAL_COMMUNITY)
Admission: EM | Disposition: A | Discharge status: Home / Self Care | Payer: Self-pay | Source: Home / Self Care | Attending: Internal Medicine

## 2015-11-19 ENCOUNTER — Telehealth: Payer: Self-pay | Admitting: Internal Medicine

## 2015-11-19 DIAGNOSIS — E785 Hyperlipidemia, unspecified: Secondary | ICD-10-CM | POA: Diagnosis present

## 2015-11-19 DIAGNOSIS — I459 Conduction disorder, unspecified: Secondary | ICD-10-CM | POA: Diagnosis not present

## 2015-11-19 DIAGNOSIS — E861 Hypovolemia: Secondary | ICD-10-CM | POA: Diagnosis present

## 2015-11-19 DIAGNOSIS — R4189 Other symptoms and signs involving cognitive functions and awareness: Secondary | ICD-10-CM

## 2015-11-19 DIAGNOSIS — Z882 Allergy status to sulfonamides status: Secondary | ICD-10-CM | POA: Diagnosis not present

## 2015-11-19 DIAGNOSIS — E860 Dehydration: Secondary | ICD-10-CM

## 2015-11-19 DIAGNOSIS — N390 Urinary tract infection, site not specified: Secondary | ICD-10-CM | POA: Diagnosis present

## 2015-11-19 DIAGNOSIS — I442 Atrioventricular block, complete: Secondary | ICD-10-CM | POA: Diagnosis present

## 2015-11-19 DIAGNOSIS — I341 Nonrheumatic mitral (valve) prolapse: Secondary | ICD-10-CM | POA: Diagnosis present

## 2015-11-19 DIAGNOSIS — I110 Hypertensive heart disease with heart failure: Secondary | ICD-10-CM | POA: Diagnosis not present

## 2015-11-19 DIAGNOSIS — I5022 Chronic systolic (congestive) heart failure: Secondary | ICD-10-CM | POA: Diagnosis not present

## 2015-11-19 DIAGNOSIS — Z950 Presence of cardiac pacemaker: Secondary | ICD-10-CM | POA: Diagnosis not present

## 2015-11-19 DIAGNOSIS — I498 Other specified cardiac arrhythmias: Secondary | ICD-10-CM | POA: Diagnosis not present

## 2015-11-19 DIAGNOSIS — I447 Left bundle-branch block, unspecified: Secondary | ICD-10-CM | POA: Diagnosis present

## 2015-11-19 DIAGNOSIS — I255 Ischemic cardiomyopathy: Secondary | ICD-10-CM | POA: Diagnosis present

## 2015-11-19 DIAGNOSIS — E872 Acidosis: Secondary | ICD-10-CM | POA: Diagnosis not present

## 2015-11-19 DIAGNOSIS — R55 Syncope and collapse: Secondary | ICD-10-CM | POA: Diagnosis not present

## 2015-11-19 HISTORY — PX: EP IMPLANTABLE DEVICE: SHX172B

## 2015-11-19 HISTORY — PX: INSERT / REPLACE / REMOVE PACEMAKER: SUR710

## 2015-11-19 LAB — BASIC METABOLIC PANEL
Anion gap: 9 (ref 5–15)
BUN: 6 mg/dL (ref 6–20)
CALCIUM: 8.6 mg/dL — AB (ref 8.9–10.3)
CO2: 22 mmol/L (ref 22–32)
CREATININE: 0.61 mg/dL (ref 0.44–1.00)
Chloride: 109 mmol/L (ref 101–111)
Glucose, Bld: 100 mg/dL — ABNORMAL HIGH (ref 65–99)
Potassium: 3.7 mmol/L (ref 3.5–5.1)
SODIUM: 140 mmol/L (ref 135–145)

## 2015-11-19 LAB — BRAIN NATRIURETIC PEPTIDE: B Natriuretic Peptide: 74 pg/mL (ref 0.0–100.0)

## 2015-11-19 LAB — TSH: TSH: 0.793 u[IU]/mL (ref 0.350–4.500)

## 2015-11-19 LAB — GLUCOSE, CAPILLARY: GLUCOSE-CAPILLARY: 96 mg/dL (ref 65–99)

## 2015-11-19 SURGERY — PACEMAKER IMPLANT

## 2015-11-19 MED ORDER — ACETAMINOPHEN 325 MG PO TABS: 325.0000 mg | ORAL_TABLET | ORAL | Status: DC | PRN

## 2015-11-19 MED ORDER — LIDOCAINE HCL (PF) 1 % IJ SOLN
INTRAMUSCULAR | Status: AC
  Filled 2015-11-19: qty 60

## 2015-11-19 MED ORDER — ONDANSETRON HCL 4 MG/2ML IJ SOLN
INTRAMUSCULAR | Status: AC
  Filled 2015-11-19: qty 2

## 2015-11-19 MED ORDER — METOPROLOL TARTRATE 1 MG/ML IV SOLN
INTRAVENOUS | Status: DC | PRN
Start: 2015-11-19 — End: 2015-11-19
  Administered 2015-11-19: 5 mg via INTRAVENOUS

## 2015-11-19 MED ORDER — SODIUM CHLORIDE 0.9 % IR SOLN
Status: AC
  Filled 2015-11-19: qty 2

## 2015-11-19 MED ORDER — ONDANSETRON HCL 4 MG/2ML IJ SOLN
4.0000 mg | Freq: Four times a day (QID) | INTRAMUSCULAR | Status: DC | PRN
  Administered 2015-11-19: 4 mg via INTRAVENOUS

## 2015-11-19 MED ORDER — CEFAZOLIN SODIUM 1-5 GM-% IV SOLN
1.0000 g | Freq: Four times a day (QID) | INTRAVENOUS | Status: AC
  Administered 2015-11-19 – 2015-11-20 (×3): 1 g via INTRAVENOUS
  Filled 2015-11-19 (×3): qty 50

## 2015-11-19 MED ORDER — LIDOCAINE HCL (PF) 1 % IJ SOLN
INTRAMUSCULAR | Status: AC
  Filled 2015-11-19: qty 30

## 2015-11-19 MED ORDER — SODIUM CHLORIDE 0.9 % IV SOLN: INTRAVENOUS | Status: DC

## 2015-11-19 MED ORDER — CEFAZOLIN SODIUM-DEXTROSE 2-4 GM/100ML-% IV SOLN
2.0000 g | INTRAVENOUS | Status: AC
  Administered 2015-11-19: 2 g via INTRAVENOUS

## 2015-11-19 MED ORDER — LIDOCAINE HCL (PF) 1 % IJ SOLN
INTRAMUSCULAR | Status: DC | PRN
  Administered 2015-11-19: 75 mL via INTRADERMAL

## 2015-11-19 MED ORDER — CEFAZOLIN SODIUM-DEXTROSE 2-3 GM-% IV SOLR
INTRAVENOUS | Status: AC
  Filled 2015-11-19: qty 50

## 2015-11-19 MED ORDER — HEPARIN (PORCINE) IN NACL 2-0.9 UNIT/ML-% IJ SOLN
INTRAMUSCULAR | Status: DC | PRN
  Administered 2015-11-19: 500 mL

## 2015-11-19 MED ORDER — SODIUM CHLORIDE 0.9 % IR SOLN
80.0000 mg | Status: AC
  Administered 2015-11-19: 80 mg

## 2015-11-19 MED ORDER — METOPROLOL TARTRATE 1 MG/ML IV SOLN
INTRAVENOUS | Status: AC
  Filled 2015-11-19: qty 5

## 2015-11-19 MED ORDER — SODIUM CHLORIDE 0.9 % IV SOLN
INTRAVENOUS | Status: DC | PRN
  Administered 2015-11-19: 200 mL via INTRAVENOUS

## 2015-11-19 MED ORDER — HEPARIN (PORCINE) IN NACL 2-0.9 UNIT/ML-% IJ SOLN
INTRAMUSCULAR | Status: AC
Start: 2015-11-19 — End: 2015-11-19
  Filled 2015-11-19: qty 500

## 2015-11-19 SURGICAL SUPPLY — 8 items
CABLE SURGICAL S-101-97-12 (CABLE) ×2 IMPLANT
GUIDEWIRE ANGLED .035X150CM (WIRE) ×2 IMPLANT
LEAD TENDRIL MRI 46CM LPA1200M (Lead) ×2 IMPLANT
LEAD TENDRIL MRI 52CM LPA1200M (Lead) ×3 IMPLANT
PACEMAKER ASSURITY DR-RF (Pacemaker) ×2 IMPLANT
PAD DEFIB LIFELINK (PAD) ×2 IMPLANT
SHEATH CLASSIC 8F (SHEATH) ×4 IMPLANT
TRAY PACEMAKER INSERTION (PACKS) ×3 IMPLANT

## 2015-11-19 NOTE — Consult Note (Signed)
ELECTROPHYSIOLOGY CONSULT NOTE   Patient ID: Gabrielle Hoffman MRN: [id number] DOB/AGE: 80-23-1932 80 y.o.  Admit date: 11/18/2015  Requesting Physician: Dr. Eula Hoffman Primary Physician:   Gabrielle Argyle, MD Primary Cardiologist:  Dr. Lovena Hoffman Reason for Consultation:  Syncope and 7 second pause  HPI: Gabrielle Hoffman is a 82 y.o. female with a history of HLD, HTN, mild memory loss, LV dysfunction (EF 45-50% with no clinical CHF), chronic LBBB, previous syncope s/p loop 08/2015 who presented to [facility] ED on 11/18/15 [place]. Interrogation of loop recorder showed a 7 second pause. EP is consulted for possible pacemaker placement.   She was seen by Dr Gabrielle Hoffman in 07/2015 for evaluation of syncope. With her LBBB and sudden nature of her symptoms he suspected she was having intermittent CHB due to [employer] attacks. It was decided to placed a LOOP recorder. A 2D ECHO showed EF 45-50% primarily due to systolic dyssynchrony, RWMA, ventricular septal motion dyssynergy c/w LBBB.   She was at the [facility] yesterday and was doing well while in her wheel chair touring the museum. She was getting into the car and fastened her seat belt and then she collapsed. She does not recall any symptoms prior to the episode. She stayed passed out for few seconds and then regained consciousness. On arrival to the ER, her loop recorder was interrogated which showed 7 s pause. She is being admitted for UTI infection treatment [facility] and cardiology was consulted for possible PPM placement.   Past Medical History  Diagnosis Date  . UTI (lower urinary tract infection)   . Hypertension   . Mitral valve prolapse   . Hyponatremia 12/17/2011  . DEMENTIA     very mild   . Asthma   . Diverticulosis   . Hyperlipidemia   . Osteopenia   . Vaginal prolapse   . Memory loss   . DDD (degenerative disc disease), lumbar   . Depression   . Disc disorder of  cervical region   . Venous insufficiency   . Syncope      Past Surgical History  Procedure Laterality Date  . Abdominal hysterectomy    . Tonsillectomy    . Ep implantable device N/A 08/27/2015    Procedure: Loop Recorder Insertion;  Surgeon: Evans Lance, MD;  Location: [place] [facility];  Service: Cardiovascular;  Laterality: N/A;    Allergies  Allergen Reactions  . Sulfa Antibiotics Rash  . Sulfamethoxazole Rash    I have reviewed the patient's current medications . amLODipine  5 mg Oral Daily  . cefTRIAXone (ROCEPHIN)  IV  1 g Intravenous Q24H  . lisinopril  20 mg Oral Daily  . multivitamin with minerals  1 tablet Oral Daily  . sodium chloride flush  3 mL Intravenous Q12H  . timolol  1 drop Both Eyes BID   . sodium chloride 75 mL/hr at 11/18/15 2359   acetaminophen, meclizine  Prior to Admission medications   Medication Sig Start Date End Date Taking? Authorizing Provider  acetaminophen (TYLENOL) 325 MG tablet Take 650 mg by mouth every 6 (six) hours as needed for mild pain, moderate pain or fever.    Yes Historical Provider, MD  meclizine (ANTIVERT) 12.5 MG tablet Take 12.5 mg by mouth 2 (two) times daily as needed for dizziness.  09/07/15  Yes Historical Provider, MD  timolol (TIMOPTIC) 0.25 % ophthalmic solution Place 1 drop into both eyes 2 (  two) times daily.  10/17/15  Yes Historical Provider, MD  amLODipine (NORVASC) 5 MG tablet Take 5 mg by mouth daily.    Historical Provider, MD  lisinopril (PRINIVIL,ZESTRIL) 20 MG tablet Take 20 mg by mouth daily.    Historical Provider, MD  Multiple Vitamin (MULTIVITAMIN WITH MINERALS) TABS tablet Take 1 tablet by mouth daily.    Historical Provider, MD     Social History   Social History  . Marital Status: Widowed    Spouse Name: N/A  . Number of Children: N/A  . Years of Education: N/A   Occupational History  . Not on file.   Social History Main Topics  . Smoking status: Never Smoker   . Smokeless tobacco: Never  Used  . Alcohol Use: No  . Drug Use: No  . Sexual Activity: Not Currently    Birth Control/ Protection: Post-menopausal   Other Topics Concern  . Not on file   Social History Narrative    Family Status  Relation Status Death Age  . Maternal Grandmother Deceased   . Maternal Grandfather Deceased   . Paternal Grandmother Deceased   . Paternal Grandfather Deceased    Family History  Problem Relation Age of Onset  . Cancer Sister      ROS:  Full 14 point review of systems complete and found to be negative unless listed above.  Physical Exam: Blood pressure 149/63, pulse 86, temperature 98 F (36.7 C), temperature source Oral, resp. rate 17, height 5\' 4"  (1.626 m), weight 127 lb 3.3 oz (57.7 kg), SpO2 99 %.  General: Well developed, well nourished, female in no acute distress Head: Eyes PERRLA, No xanthomas.   Normocephalic and atraumatic, oropharynx without edema or exudate.  Lungs:  Heart: HRRR S1 S2, no rub/gallop, Heart irregular rate and rhythm with S1, S2  murmur. pulses are 2+ extrem.   Neck: No carotid bruits. No lymphadenopathy.  JVD. Abdomen: Bowel sounds present, abdomen soft and non-tender without masses or hernias noted. Msk:  No spine or cva tenderness. No weakness, no joint deformities or effusions. Extremities: No clubbing or cyanosis.  edema.  Neuro: Alert and oriented X 3. No focal deficits noted. Psych:  Good affect, responds appropriately Skin: No rashes or lesions noted.  Labs:   Lab Results  Component Value Date   WBC 7.1 11/18/2015   HGB 12.5 11/18/2015   HCT 37.8 11/18/2015   MCV 90.0 11/18/2015   PLT 295 11/18/2015   No results for input(s): INR in the last 72 hours.  Recent Labs Lab 11/19/15 0223  NA 140  K 3.7  CL 109  CO2 22  BUN 6  CREATININE 0.61  CALCIUM 8.6*  GLUCOSE 100*     TSH  Date/Time Value Ref Range Status  11/18/2015 11:23 PM 0.793 0.350 - 4.500 uIU/mL Final  12/18/2011 05:55 AM 1.195 0.350 - 4.500 uIU/mL Final    No results found for: VITAMINB12, FOLATE, FERRITIN, TIBC, IRON, RETICCTPCT  Echo:07/26/2015 LV EF: 45% - 50% Study Conclusions - Left ventricle: The cavity size was normal. There was mild focal  basal hypertrophy of the septum. Systolic function was mildly  reduced, primarily due to systolic dyssynchrony. The estimated  ejection fraction was in the range of 45% to 50%. Wall motion was  normal; there were no regional wall motion abnormalities. There  was fusion of early and atrial contributions to ventricular  filling. The study is not technically sufficient to allow  evaluation of LV diastolic function. - Ventricular septum:  Septal motion showed abnormal function,  dyssynergy, and paradox. These changes are consistent with a left  bundle branch block. - Aortic valve: There was trivial regurgitation. - Mitral valve: Calcified annulus.  ECG:  Sinus tachycardia HR 116 Left bundle branch block  Radiology:  Dg Abd Portable 1v  11/19/2015  CLINICAL DATA:  Abdominal distention. EXAM: PORTABLE ABDOMEN - 1 VIEW COMPARISON:  CT abdomen/ pelvis 12/18/2011 FINDINGS: Normal bowel gas pattern without dilated bowel loops to suggest obstruction. Air in stool throughout the colon, small to moderate stool burden. No evidence of free air. Rounded hyperdensities in the left upper quadrant likely retained barium within colonic diverticula. There is scoliotic curvature of the spine. IMPRESSION: Normal bowel gas pattern.  Small to moderate stool burden. Electronically Signed   By: Jeb Levering M.D.   On: 11/19/2015 03:11    ASSESSMENT AND PLAN:    Principal Problem:   Syncope Active Problems:   Dehydration   UTI (lower urinary tract infection)   HTN (hypertension)   Cognitive change   Arrhythmia   Syncope and collapse   Chronic systolic heart failure ([place])  Gabrielle Hoffman is a 82 y.o. female with a history of HLD, HTN, mild memory loss, LV dysfunction (EF 45-50% with no clinical  CHF), chronic LBBB, previous syncope s/p loop 08/2015 who presented to [facility] ED on 11/18/15 [place]. Interrogation of loop recorder showed a 7 second pause. EP is consulted for possible pacemaker placement.   Syncope with 7 second pause on loop interrogation:  Will plan for pacemaker placement with BiV CRT-P in the setting of chronic LBBB and mild LV dysfunction due to systolic dyssynchrony.    SignedCrista Luria 11/19/2015 9:02 AM  Pager [phone]  EP Attending  Patient seen and examined. Agree with the findings as noted above by Mrs. Vertell Limber, PA-C. 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 The patient denies any heart failure symptoms although she does note some memory problems. Her exam is notable for a RRR with a split S2. Lungs are clear and ext. Demonstrate no edema. Neuro is non-focal except for memory. She repeats herself some. Tele - nsr and ECG demonstrated LBBB.  A/P 1. Stokes Adams attacks 2. Intermittent CHB 3. HTN 4. LV dysfunction with no CHF symptoms Rec: I have discussed the risks/benefits/goals/expectations of PPM insertion and she wishes to proceed. Despite her LV dysfunction, because she has not had any CHF, I will hold off on biv pacing as she may well not pace much and she has some dementia.  [name]

## 2015-11-19 NOTE — Care Management Obs Status (Signed)
[place] NOTIFICATION   Patient Details  Name: AMAHRI HOPPES MRN: [id number] Date of Birth: Nov 12, 1930   Medicare Observation Status Notification Given:  Yes    Dawayne Patricia, RN 11/19/2015, 10:53 AM

## 2015-11-19 NOTE — Telephone Encounter (Signed)
Pt had a pacemaker put in today and he have not talked to anybody. He said he would like to talk to Dr Delanna Notice to get general information andd some questions. Thank you very much.

## 2015-11-19 NOTE — Progress Notes (Signed)
[facility]                                                                              Patient Demographics  Gabrielle Hoffman, is a 82 y.o. female, DOB - 1930-11-02, [id number]  Admit date - 11/18/2015   Admitting Physician Lily Kocher, MD  Outpatient Primary MD for the patient is Mathews Argyle, MD  Outpatient specialists:   LOS -   days    Chief Complaint  Patient presents with  . Loss of Consciousness       Brief HPI  The patient is a 82 year old female with HTN, HLD, early cognitive impairment, and recurrent syncopal episodes S/P implanted loop recorder in January who [place] of [place]. She was in a wheelchair most of the afternoon and only stood up outside for about five minutes (while waiting for the car at the end of the visit).She slumped over in the backseat and was unresponsive for approximately five seconds. No stigmata of seizure activity. No confusion. No preceding symptoms.  ED evaluation showed UTI. Loop recorder interrogation revealed a 7 second pause, presumed to correlate with the patient's symptoms. Patient was admitted for further workup.   Assessment & Plan    Principal Problem:   SyncopeWith 7 second pause on loop recorder - Cardiology consulted hold beta blocker,,   - TSH 0.7  - EP consulted, Dr Lovena Le recommended pacemaker placement   Active Problems:   Dehydration with lactic acidosis - Continue gentle hydration    UTI (lower urinary tract infection) - Follow urine culture and sensitivities, continue IV Rocephin    HTN (hypertension) Continue amlodipine, lisinopril, hold beta blocker    Cognitive change - Currently at baseline     Chronic systolic heart failure ([place]), chronic LBBB - 2-D echo 12/16 showed EF of 45-50% with no regional wall motion abnormalities, currently compensated, on hypovolemic side  - BNP 74  Code Status:Full code  family Communication:  Discussed in detail with the patient, all imaging results, lab results explained to the patient   Disposition Plan:   Time Spent in minutes 25 minutes  Procedures  None   Consults   cardiology  DVT Prophylaxis  SCD's  Medications  Scheduled Meds: . amLODipine  5 mg Oral Daily  . cefTRIAXone (ROCEPHIN)  IV  1 g Intravenous Q24H  . lisinopril  20 mg Oral Daily  . multivitamin with minerals  1 tablet Oral Daily  . sodium chloride flush  3 mL Intravenous Q12H  . timolol  1 drop Both Eyes BID   Continuous Infusions: . sodium chloride 75 mL/hr at 11/18/15 2359   PRN Meds:.acetaminophen, meclizine   Antibiotics   Anti-infectives    Start     Dose/Rate Route Frequency Ordered Stop   11/19/15 2000  cefTRIAXone (ROCEPHIN) 1 g in dextrose 5 % 50 mL IVPB     1 g 100 mL/hr over 30 Minutes Intravenous Every 24 hours 11/18/15 2345     11/18/15 2000  cefTRIAXone (ROCEPHIN) 1 g in dextrose 5 % 50 mL IVPB     1 g 100 mL/hr  over 30 Minutes Intravenous  Once 11/18/15 1957 11/18/15 2133        Subjective:   Gabrielle Hoffman was seen and examined today. Patient denies dizziness, chest pain, shortness of breath, abdominal pain, N/V/D/C, new weakness, numbess, tingling. No acute events overnight.    Objective:   Filed Vitals:   11/18/15 2245 11/18/15 2343 11/19/15 0418 11/19/15 0953  BP: 138/56 150/58 149/63 111/48  Pulse: 96 99 86   Temp:  97.5 F (36.4 C) 98 F (36.7 C)   TempSrc:  Oral Oral   Resp: 13 16 17    Height:  5\' 4"  (1.626 m)    Weight:  58.4 kg (128 lb 12 oz) 57.7 kg (127 lb 3.3 oz)   SpO2: 98% 99% 99%     Intake/Output Summary (Last 24 hours) at 11/19/15 1026 Last data filed at 11/19/15 0439  Gross per 24 hour  Intake      3 ml  Output   1500 ml  Net  -1497 ml     Wt Readings from Last 3 Encounters:  11/19/15 57.7 kg (127 lb 3.3 oz)  08/27/15 63.504 kg (140 lb)  08/22/15 62.596 kg (138 lb)     Exam  General: Alert and oriented x 3, NAD  HEENT:   PERRLA, EOMI, Anicteric Sclera, mucous membranes moist.   Neck: Supple, no JVD, no masses  CVS: S1 S2 auscultated, regular rate and rhythm   Respiratory: Clear to auscultation bilaterally, no wheezing, rales or rhonchi  Abdomen: Soft, nontender, nondistended, + bowel sounds  Ext: no cyanosis clubbing or edema  Neuro: AAOx3, Cr N's II- XII. Strength 5/5 upper and lower extremities bilaterally  Skin: No rashes  Psych: Normal affect and demeanor, alert and oriented x3    Data Reviewed:  I have personally reviewed following labs and imaging studies  Micro Results No results found for this or any previous visit (from the past 240 hour(s)).  Radiology Reports Dg Abd Portable 1v  11/19/2015  CLINICAL DATA:  Abdominal distention. EXAM: PORTABLE ABDOMEN - 1 VIEW COMPARISON:  CT abdomen/ pelvis 12/18/2011 FINDINGS: Normal bowel gas pattern without dilated bowel loops to suggest obstruction. Air in stool throughout the colon, small to moderate stool burden. No evidence of free air. Rounded hyperdensities in the left upper quadrant likely retained barium within colonic diverticula. There is scoliotic curvature of the spine. IMPRESSION: Normal bowel gas pattern.  Small to moderate stool burden. Electronically Signed   By: Jeb Levering M.D.   On: 11/19/2015 03:11    CBC  Recent Labs Lab 11/18/15 1732  WBC 7.1  HGB 12.5  HCT 37.8  PLT 295  MCV 90.0  MCH 29.8  MCHC 33.1  RDW 14.3    Chemistries   Recent Labs Lab 11/18/15 1732 11/19/15 0223  NA 133* 140  K 4.2 3.7  CL 99* 109  CO2 23 22  GLUCOSE 116* 100*  BUN 12 6  CREATININE 0.74 0.61  CALCIUM 9.6 8.6*   ------------------------------------------------------------------------------------------------------------------ estimated creatinine clearance is 46.8 mL/min (by C-G formula based on Cr of 0.61). ------------------------------------------------------------------------------------------------------------------ No  results for input(s): HGBA1C in the last 72 hours. ------------------------------------------------------------------------------------------------------------------ No results for input(s): CHOL, HDL, LDLCALC, TRIG, CHOLHDL, LDLDIRECT in the last 72 hours. ------------------------------------------------------------------------------------------------------------------  Recent Labs  11/18/15 2323  TSH 0.793   ------------------------------------------------------------------------------------------------------------------ No results for input(s): VITAMINB12, FOLATE, FERRITIN, TIBC, IRON, RETICCTPCT in the last 72 hours.  Coagulation profile No results for input(s): INR, PROTIME in the last 168 hours.  No results  for input(s): DDIMER in the last 72 hours.  Cardiac Enzymes No results for input(s): CKMB, TROPONINI, MYOGLOBIN in the last 168 hours.  Invalid input(s): CK ------------------------------------------------------------------------------------------------------------------ Invalid input(s): [place]  11/18/15 1801 11/19/15 0624  GLUCAP 99 96     Mikhayla Phillis M.D. [facility] 11/19/2015, 10:26 AM  Pager: [phone] Between 7am to 7pm - call Pager - [phone]  After 7pm go to [email or web address] - password [email or web address]  Call night coverage person covering after 7pm

## 2015-11-19 NOTE — H&P (View-Only) (Signed)
ELECTROPHYSIOLOGY CONSULT NOTE   Patient ID: Gabrielle Hoffman MRN: [id number] DOB/AGE: 01-01-1931 82 y.o.  Admit date: 11/18/2015  Requesting Physician: Dr. [name] Primary Physician:   Mathews Argyle, MD Primary Cardiologist:  Dr. Lovena Le Reason for Consultation:  Syncope and 7 second pause  HPI: Gabrielle Hoffman is a 82 y.o. female with a history of HLD, HTN, mild memory loss, LV dysfunction (EF 45-50% with no clinical CHF), chronic LBBB, previous syncope s/p loop 08/2015 who presented to [facility] ED on 11/18/15 [place]. Interrogation of loop recorder showed a 7 second pause. EP is consulted for possible pacemaker placement.   She was seen by Dr Lovena Le in 07/2015 for evaluation of syncope. With her LBBB and sudden nature of her symptoms he suspected she was having intermittent CHB due to [employer] attacks. It was decided to placed a LOOP recorder. A 2D ECHO showed EF 45-50% primarily due to systolic dyssynchrony, RWMA, ventricular septal motion dyssynergy c/w LBBB.   She was at the [employer] yesterday and was doing well while in her wheel chair touring the museum. She was getting into the car and fastened her seat belt and then she collapsed. She does not recall any symptoms prior to the episode. She stayed passed out for few seconds and then regained consciousness. On arrival to the ER, her loop recorder was interrogated which showed 7 s pause. She is being admitted for UTI infection treatment [facility] and cardiology was consulted for possible PPM placement.   Past Medical History  Diagnosis Date  . UTI (lower urinary tract infection)   . Hypertension   . Mitral valve prolapse   . Hyponatremia 12/17/2011  . DEMENTIA     very mild   . Asthma   . Diverticulosis   . Hyperlipidemia   . Osteopenia   . Vaginal prolapse   . Memory loss   . DDD (degenerative disc disease), lumbar   . Depression   . Disc disorder of  cervical region   . Venous insufficiency   . Syncope      Past Surgical History  Procedure Laterality Date  . Abdominal hysterectomy    . Tonsillectomy    . Ep implantable device N/A 08/27/2015    Procedure: Loop Recorder Insertion;  Surgeon: Evans Lance, MD;  Location: [facility];  Service: Cardiovascular;  Laterality: N/A;    Allergies  Allergen Reactions  . Sulfa Antibiotics Rash  . Sulfamethoxazole Rash    I have reviewed the patient's current medications . amLODipine  5 mg Oral Daily  . cefTRIAXone (ROCEPHIN)  IV  1 g Intravenous Q24H  . lisinopril  20 mg Oral Daily  . multivitamin with minerals  1 tablet Oral Daily  . sodium chloride flush  3 mL Intravenous Q12H  . timolol  1 drop Both Eyes BID   . sodium chloride 75 mL/hr at 11/18/15 2359   acetaminophen, meclizine  Prior to Admission medications   Medication Sig Start Date End Date Taking? Authorizing Provider  acetaminophen (TYLENOL) 325 MG tablet Take 650 mg by mouth every 6 (six) hours as needed for mild pain, moderate pain or fever.    Yes Historical Provider, MD  meclizine (ANTIVERT) 12.5 MG tablet Take 12.5 mg by mouth 2 (two) times daily as needed for dizziness.  09/07/15  Yes Historical Provider, MD  timolol (TIMOPTIC) 0.25 % ophthalmic solution Place 1 drop into both eyes 2 (  two) times daily.  10/17/15  Yes Historical Provider, MD  amLODipine (NORVASC) 5 MG tablet Take 5 mg by mouth daily.    Historical Provider, MD  lisinopril (PRINIVIL,ZESTRIL) 20 MG tablet Take 20 mg by mouth daily.    Historical Provider, MD  Multiple Vitamin (MULTIVITAMIN WITH MINERALS) TABS tablet Take 1 tablet by mouth daily.    Historical Provider, MD     Social History   Social History  . Marital Status: Widowed    Spouse Name: N/A  . Number of Children: N/A  . Years of Education: N/A   Occupational History  . Not on file.   Social History Main Topics  . Smoking status: Never Smoker   . Smokeless tobacco: Never  Used  . Alcohol Use: No  . Drug Use: No  . Sexual Activity: Not Currently    Birth Control/ Protection: Post-menopausal   Other Topics Concern  . Not on file   Social History Narrative    Family Status  Relation Status Death Age  . Maternal Grandmother Deceased   . Maternal Grandfather Deceased   . Paternal Grandmother Deceased   . Paternal Grandfather Deceased    Family History  Problem Relation Age of Onset  . Cancer Sister      ROS:  Full 14 point review of systems complete and found to be negative unless listed above.  Physical Exam: Blood pressure 149/63, pulse 86, temperature 98 F (36.7 C), temperature source Oral, resp. rate 17, height 5\' 4"  (1.626 m), weight 127 lb 3.3 oz (57.7 kg), SpO2 99 %.  General: Well developed, well nourished, female in no acute distress Head: Eyes PERRLA, No xanthomas.   Normocephalic and atraumatic, oropharynx without edema or exudate.  Lungs:  Heart: HRRR S1 S2, no rub/gallop, Heart irregular rate and rhythm with S1, S2  murmur. pulses are 2+ extrem.   Neck: No carotid bruits. No lymphadenopathy.  JVD. Abdomen: Bowel sounds present, abdomen soft and non-tender without masses or hernias noted. Msk:  No spine or cva tenderness. No weakness, no joint deformities or effusions. Extremities: No clubbing or cyanosis.  edema.  Neuro: Alert and oriented X 3. No focal deficits noted. Psych:  Good affect, responds appropriately Skin: No rashes or lesions noted.  Labs:   Lab Results  Component Value Date   WBC 7.1 11/18/2015   HGB 12.5 11/18/2015   HCT 37.8 11/18/2015   MCV 90.0 11/18/2015   PLT 295 11/18/2015   No results for input(s): INR in the last 72 hours.  Recent Labs Lab 11/19/15 0223  NA 140  K 3.7  CL 109  CO2 22  BUN 6  CREATININE 0.61  CALCIUM 8.6*  GLUCOSE 100*     TSH  Date/Time Value Ref Range Status  11/18/2015 11:23 PM 0.793 0.350 - 4.500 uIU/mL Final  12/18/2011 05:55 AM 1.195 0.350 - 4.500 uIU/mL Final    No results found for: VITAMINB12, FOLATE, FERRITIN, TIBC, IRON, RETICCTPCT  Echo:07/26/2015 LV EF: 45% - 50% Study Conclusions - Left ventricle: The cavity size was normal. There was mild focal  basal hypertrophy of the septum. Systolic function was mildly  reduced, primarily due to systolic dyssynchrony. The estimated  ejection fraction was in the range of 45% to 50%. Wall motion was  normal; there were no regional wall motion abnormalities. There  was fusion of early and atrial contributions to ventricular  filling. The study is not technically sufficient to allow  evaluation of LV diastolic function. - Ventricular septum:  Septal motion showed abnormal function,  dyssynergy, and paradox. These changes are consistent with a left  bundle branch block. - Aortic valve: There was trivial regurgitation. - Mitral valve: Calcified annulus.  ECG:  Sinus tachycardia HR 116 Left bundle branch block  Radiology:  Dg Abd Portable 1v  11/19/2015  CLINICAL DATA:  Abdominal distention. EXAM: PORTABLE ABDOMEN - 1 VIEW COMPARISON:  CT abdomen/ pelvis 12/18/2011 FINDINGS: Normal bowel gas pattern without dilated bowel loops to suggest obstruction. Air in stool throughout the colon, small to moderate stool burden. No evidence of free air. Rounded hyperdensities in the left upper quadrant likely retained barium within colonic diverticula. There is scoliotic curvature of the spine. IMPRESSION: Normal bowel gas pattern.  Small to moderate stool burden. Electronically Signed   By: Jeb Levering M.D.   On: 11/19/2015 03:11    ASSESSMENT AND PLAN:    Principal Problem:   Syncope Active Problems:   Dehydration   UTI (lower urinary tract infection)   HTN (hypertension)   Cognitive change   Arrhythmia   Syncope and collapse   Chronic systolic heart failure ([place])  Gabrielle Hoffman is a 82 y.o. female with a history of HLD, HTN, mild memory loss, LV dysfunction (EF 45-50% with no clinical  CHF), chronic LBBB, previous syncope s/p loop 08/2015 who presented [facility] ED on 11/18/15 [place]. Interrogation of loop recorder showed a 7 second pause. EP is consulted for possible pacemaker placement.   Syncope with 7 second pause on loop interrogation:  Will plan for pacemaker placement with BiV CRT-P in the setting of chronic LBBB and mild LV dysfunction due to systolic dyssynchrony.    SignedCrista Luria 11/19/2015 9:02 AM  Pager [phone]  EP Attending  Patient seen and examined. Agree with the findings as noted above by Mrs. Vertell Limber, PA-C. 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 The patient denies any heart failure symptoms although she does note some memory problems. Her exam is notable for a RRR with a split S2. Lungs are clear and ext. Demonstrate no edema. Neuro is non-focal except for memory. She repeats herself some. Tele - nsr and ECG demonstrated LBBB.  A/P 1. Stokes Adams attacks 2. Intermittent CHB 3. HTN 4. LV dysfunction with no CHF symptoms Rec: I have discussed the risks/benefits/goals/expectations of PPM insertion and she wishes to proceed. Despite her LV dysfunction, because she has not had any CHF, I will hold off on biv pacing as she may well not pace much and she has some dementia.  [name]

## 2015-11-19 NOTE — Interval H&P Note (Signed)
History and Physical Interval Note:  11/19/2015 11:15 AM  Gabrielle Hoffman  has presented today for surgery, with the diagnosis of heart block  The various methods of treatment have been discussed with the patient and family. After consideration of risks, benefits and other options for treatment, the patient has consented to  Procedure(s): Pacemaker Implant (N/A) as a surgical intervention .  The patient's history has been reviewed, patient examined, no change in status, stable for surgery.  I have reviewed the patient's chart and labs.  Questions were answered to the patient's satisfaction.     Cristopher Peru

## 2015-11-20 ENCOUNTER — Inpatient Hospital Stay (HOSPITAL_COMMUNITY): Payer: Medicare Other

## 2015-11-20 ENCOUNTER — Telehealth: Payer: Self-pay | Admitting: Internal Medicine

## 2015-11-20 ENCOUNTER — Encounter (HOSPITAL_COMMUNITY): Payer: Self-pay | Admitting: Internal Medicine

## 2015-11-20 LAB — GLUCOSE, CAPILLARY: Glucose-Capillary: 104 mg/dL — ABNORMAL HIGH (ref 65–99)

## 2015-11-20 LAB — BASIC METABOLIC PANEL
Anion gap: 9 (ref 5–15)
BUN: 11 mg/dL (ref 6–20)
CALCIUM: 8.7 mg/dL — AB (ref 8.9–10.3)
CO2: 23 mmol/L (ref 22–32)
CREATININE: 0.74 mg/dL (ref 0.44–1.00)
Chloride: 103 mmol/L (ref 101–111)
GFR calc Af Amer: >60 mL/min (ref >60)
GFR calc non Af Amer: >60 mL/min (ref >60)
Glucose, Bld: 106 mg/dL — ABNORMAL HIGH (ref 65–99)
Potassium: 4.3 mmol/L (ref 3.5–5.1)
SODIUM: 135 mmol/L (ref 135–145)

## 2015-11-20 LAB — CBC
HCT: 33.4 % — ABNORMAL LOW (ref 36.0–46.0)
Hemoglobin: 10.9 g/dL — ABNORMAL LOW (ref 12.0–15.0)
MCH: 29.4 pg (ref 26.0–34.0)
MCHC: 32.6 g/dL (ref 30.0–36.0)
MCV: 90 fL (ref 78.0–100.0)
PLATELETS: 261 10*3/uL (ref 150–400)
RBC: 3.71 MIL/uL — AB (ref 3.87–5.11)
RDW: 14.2 % (ref 11.5–15.5)
WBC: 9.4 10*3/uL (ref 4.0–10.5)

## 2015-11-20 MED ORDER — CARVEDILOL 6.25 MG PO TABS: 6.2500 mg | ORAL_TABLET | Freq: Two times a day (BID) | ORAL | Status: DC

## 2015-11-20 MED ORDER — CEPHALEXIN 500 MG PO CAPS: 500.0000 mg | ORAL_CAPSULE | Freq: Two times a day (BID) | ORAL | Status: DC

## 2015-11-20 MED FILL — Sodium Chloride Irrigation Soln 0.9%: Qty: 500 | Status: AC

## 2015-11-20 MED FILL — Gentamicin Sulfate Inj 40 MG/ML: INTRAMUSCULAR | Qty: 2 | Status: AC

## 2015-11-20 NOTE — Telephone Encounter (Signed)
Dr Lovena Le calling son today

## 2015-11-20 NOTE — Telephone Encounter (Signed)
Dr Lovena Le spoke to son

## 2015-11-20 NOTE — Progress Notes (Signed)
SUBJECTIVE: The patient is doing well today.  At this time, she denies chest pain, shortness of breath, or any new concerns.  CURRENT MEDICATIONS: . amLODipine  5 mg Oral Daily  .  ceFAZolin (ANCEF) IV  1 g Intravenous Q6H  . cefTRIAXone (ROCEPHIN)  IV  1 g Intravenous Q24H  . lisinopril  20 mg Oral Daily  . multivitamin with minerals  1 tablet Oral Daily  . sodium chloride flush  3 mL Intravenous Q12H  . timolol  1 drop Both Eyes BID   . sodium chloride 75 mL/hr at 11/18/15 2359    OBJECTIVE: Physical Exam: Filed Vitals:   11/19/15 1258 11/19/15 1500 11/19/15 2056 11/20/15 0350  BP: 160/94 132/67 121/60 134/65  Pulse: 108 85 93 91  Temp:  98 F (36.7 C) 98.7 F (37.1 C) 98.3 F (36.8 C)  TempSrc:  Oral Oral Oral  Resp: 15 17 18 18   Height:      Weight:    128 lb (58.06 kg)  SpO2: 100% 98% 98% 96%    Intake/Output Summary (Last 24 hours) at 11/20/15 0851 Last data filed at 11/20/15 0553  Gross per 24 hour  Intake    360 ml  Output    500 ml  Net   -140 ml    Telemetry reveals sinus rhythm  GEN- The patient is elderly appearing, alert    Head- normocephalic, atraumatic Eyes-  Sclera clear, conjunctiva pink Ears- hearing intact Oropharynx- clear Neck- supple  Lungs- Clear to ausculation bilaterally, normal work of breathing Heart- Regular rate and rhythm  GI- soft, NT, ND, + BS Extremities- no clubbing, cyanosis, or edema Skin- no rash or lesion Psych- euthymic mood, full affect Neuro- strength and sensation are intact  LABS: Basic Metabolic Panel:  Recent Labs  11/19/15 0223 11/20/15 0320  NA 140 135  K 3.7 4.3  CL 109 103  CO2 22 23  GLUCOSE 100* 106*  BUN 6 11  CREATININE 0.61 0.74  CALCIUM 8.6* 8.7*   CBC:  Recent Labs  11/18/15 1732 11/20/15 0320  WBC 7.1 9.4  HGB 12.5 10.9*  HCT 37.8 33.4*  MCV 90.0 90.0  PLT 295 261   Thyroid Function Tests:  Recent Labs  11/18/15 2323  TSH 0.793    RADIOLOGY: Dg Chest 2 View  11/20/2015  CLINICAL DATA:  Pacemaker EXAM: CHEST  2 VIEW COMPARISON:  02/17/2012 FINDINGS: Left subclavian dual lead pacemaker has been placed with leads in the right atrium and right ventricle in good position. No pneumothorax. COPD. Negative for infiltrate or effusion. Negative for heart failure. Mild compression fracture at approximately T12 which was not present on the prior study. IMPRESSION: Satisfactory pacemaker placement without acute complication. Electronically Signed   By: Franchot Gallo M.D.   On: 11/20/2015 07:51   ASSESSMENT AND PLAN:  Principal Problem:   Syncope Active Problems:   Dehydration   UTI (lower urinary tract infection)   HTN (hypertension)   Cognitive change   Arrhythmia   Syncope and collapse   Chronic systolic heart failure (HCC)   Intermittent complete heart block (HCC)    1.  Intermittent complete heart block S/p PPM implant 11/19/15 CXR without ptx Device interrogated and functioning normally Left chest without hematoma/ecchymosis  2.  HTN Elevated Will add BB with mild LV dysfunction  Ok from EP standpoint to discharge home. Will arrange outpatient follow up and enter in [place], NP 11/20/2015 8:54 AM  EP attending  Patient seen and examined. Agree with above. [place] for discharge. Her DDD PM is working normally. Will recheck in followup.  [name]

## 2015-11-20 NOTE — Progress Notes (Signed)
Pt in stable condition, pt belongings at bedside went over dc instructions with pt and son, they verbalized understanding, cardiac monitor dc, iv taken out, pt taken off the floor on a wheelchair

## 2015-11-20 NOTE — Discharge Instructions (Signed)
° ° °  Supplemental Discharge Instructions for  Pacemaker/Defibrillator Patients  Activity No heavy lifting or vigorous activity with your left/right arm for 6 to 8 weeks.  Do not raise your left/right arm above your head for one week.  Gradually raise your affected arm as drawn below.           __       11/24/15                         11/25/15                   11/26/15                 11/27/15   WOUND CARE - Keep the wound area clean and dry.  Do not get this area wet for one week. No showers for one week; you may shower on  11/27/15   . - The tape/steri-strips on your wound will fall off; do not pull them off.  No bandage is needed on the site.  DO  NOT apply any creams, oils, or ointments to the wound area. - If you notice any drainage or discharge from the wound, any swelling or bruising at the site, or you develop a fever > 101? F after you are discharged home, call the office at once.  Special Instructions - You are still able to use cellular telephones; use the ear opposite the side where you have your pacemaker/defibrillator.  Avoid carrying your cellular phone near your device. - When traveling through airports, show security personnel your identification card to avoid being screened in the metal detectors.  Ask the security personnel to use the hand wand. - Avoid arc welding equipment, MRI testing (magnetic resonance imaging), TENS units (transcutaneous nerve stimulators).  Call the office for questions about other devices. - Avoid electrical appliances that are in poor condition or are not properly grounded. - Microwave ovens are safe to be near or to operate.

## 2015-11-20 NOTE — Telephone Encounter (Signed)
F/U Phone Call  Jeneen Rinks (son) is calling to follow up on phone call from 11/19/15 about his mother procedure on yesterday , as well as wanting to can she go have her hair done on the 27th . Please call    Thanks

## 2015-11-20 NOTE — Discharge Summary (Signed)
Physician Discharge Summary   Patient ID: Gabrielle Hoffman MRN: [id number] DOB/AGE: 1930-09-21 82 y.o.  Admit date: 11/18/2015 Discharge date: 11/20/2015  Primary Care Physician:  Mathews Argyle, MD  Discharge Diagnoses:    . Syncope . Intermittent complete heart block (HCC)Status post pacemaker placement  . UTI (lower urinary tract infection) . Dehydration . HTN (hypertension)   Consults: Cardiology  Recommendations for Outpatient Follow-up:  1. Please repeat CBC/BMET at next visit   DIET: Heart healthy diet    Allergies:   Allergies  Allergen Reactions  . Sulfa Antibiotics Rash  . Sulfamethoxazole Rash     DISCHARGE MEDICATIONS: Current Discharge Medication List    START taking these medications   Details  carvedilol (COREG) 6.25 MG tablet Take 1 tablet (6.25 mg total) by mouth 2 (two) times daily with a meal. Qty: 60 tablet, Refills: 3    cephALEXin (KEFLEX) 500 MG capsule Take 1 capsule (500 mg total) by mouth 2 (two) times daily. X 3days Qty: 6 capsule, Refills: 0      CONTINUE these medications which have NOT CHANGED   Details  acetaminophen (TYLENOL) 325 MG tablet Take 650 mg by mouth every 6 (six) hours as needed for mild pain, moderate pain or fever.     amLODipine (NORVASC) 5 MG tablet Take 5 mg by mouth daily.    lisinopril (PRINIVIL,ZESTRIL) 20 MG tablet Take 20 mg by mouth daily.    meclizine (ANTIVERT) 12.5 MG tablet Take 12.5 mg by mouth 2 (two) times daily as needed for dizziness.     Multiple Vitamin (MULTIVITAMIN WITH MINERALS) TABS tablet Take 1 tablet by mouth daily.    timolol (TIMOPTIC) 0.25 % ophthalmic solution Place 1 drop into both eyes 2 (two) times daily.          Brief H and P: For complete details please refer to admission H and P, but in brief The patient is a 82 year old female with HTN, HLD, early cognitive impairment, and recurrent syncopal episodes S/P implanted loop recorder in January who was out with [place]  [facility] the science center on the day of admission. She was in a wheelchair most of the afternoon and only stood up outside for about five minutes (while waiting for the car at the end of the visit).She slumped over in the backseat and was unresponsive for approximately five seconds. No stigmata of seizure activity. No confusion. No preceding symptoms. ED evaluation showed UTI. Loop recorder interrogation revealed a 7 second pause, presumed to correlate with the patient's symptoms. Patient was admitted for further workup.  Hospital Course:  SyncopeWith 7 second pause on loop recorder - Cardiology was consulted , initially had beta blocker. TSH 0.7.  - EP cardiology was consulted, patient was seen by Dr. Lovena Le. Patient had permanent pacemaker placed on 4/17. - She was restarted on Coreg due to hypotension and mild LV dysfunction. She was cleared for discharge home by radiology.    Dehydration with lactic acidosis - Resolved with gentle hydration   UTI (lower urinary tract infection) - Urine cultures pending, patient received IV Rocephin during hospitalization, transitioned to oral Keflex.    HTN (hypertension) Continue amlodipine, lisinopril, beta blocker   Cognitive change - Currently at baseline    Chronic systolic heart failure ([place]), chronic LBBB - 2-D echo 12/16 showed EF of 45-50% with no regional wall motion abnormalities, currently compensated, on hypovolemic side  - BNP 74  PT evaluation prior to discharge.  Day of Discharge BP 136/72 mmHg  Pulse  88  Temp(Src) 98.1 F (36.7 C) (Oral)  Resp 18  Ht 5\' 4"  (1.626 m)  Wt 58.06 kg (128 lb)  BMI 21.96 kg/m2  SpO2 97%  Physical Exam: General: Alert and awake oriented x3 not in any acute distress. HEENT: anicteric sclera, pupils reactive to light and accommodation CVS: S1-S2 clear no murmur rubs or gallops Chest: clear to auscultation bilaterally, no wheezing rales or rhonchi, pacemaker in the left chest  wall Abdomen: soft nontender, nondistended, normal bowel sounds Extremities: no cyanosis, clubbing or edema noted bilaterally Neuro: Cranial nerves II-XII intact, no focal neurological deficits   The results of significant diagnostics from this hospitalization (including imaging, microbiology, ancillary and laboratory) are listed below for reference.    LAB RESULTS: Basic Metabolic Panel:  Recent Labs Lab 11/19/15 0223 11/20/15 0320  NA 140 135  K 3.7 4.3  CL 109 103  CO2 22 23  GLUCOSE 100* 106*  BUN 6 11  CREATININE 0.61 0.74  CALCIUM 8.6* 8.7*   Liver Function Tests: No results for input(s): AST, ALT, ALKPHOS, BILITOT, PROT, ALBUMIN in the last 168 hours. No results for input(s): LIPASE, AMYLASE in the last 168 hours. No results for input(s): AMMONIA in the last 168 hours. CBC:  Recent Labs Lab 11/18/15 1732 11/20/15 0320  WBC 7.1 9.4  HGB 12.5 10.9*  HCT 37.8 33.4*  MCV 90.0 90.0  PLT 295 261   Cardiac Enzymes: No results for input(s): CKTOTAL, CKMB, CKMBINDEX, TROPONINI in the last 168 hours. BNP: Invalid input(s): POCBNP CBG:  Recent Labs Lab 11/19/15 0624 11/20/15 0700  GLUCAP 96 104*    Significant Diagnostic Studies:  Dg Abd Portable 1v  11/19/2015  CLINICAL DATA:  Abdominal distention. EXAM: PORTABLE ABDOMEN - 1 VIEW COMPARISON:  CT abdomen/ pelvis 12/18/2011 FINDINGS: Normal bowel gas pattern without dilated bowel loops to suggest obstruction. Air in stool throughout the colon, small to moderate stool burden. No evidence of free air. Rounded hyperdensities in the left upper quadrant likely retained barium within colonic diverticula. There is scoliotic curvature of the spine. IMPRESSION: Normal bowel gas pattern.  Small to moderate stool burden. Electronically Signed   By: Jeb Levering M.D.   On: 11/19/2015 03:11    2D ECHO:   Disposition and Follow-up: Discharge Instructions    Diet - low sodium heart healthy    Complete by:  As directed       Increase activity slowly    Complete by:  As directed             DISPOSITION: Home   DISCHARGE FOLLOW-UP Follow-up Information    Follow up with [facility] On 11/29/2015.   Specialty:  Cardiology   Why:  at [facility] for wound check    Contact information:   [address], [place] [phone]      Follow up with Mathews Argyle, MD. Schedule an appointment as soon as possible for a visit in 2 weeks.   Specialty:  Internal Medicine   Why:  for hospital follow-up   Contact information:   [address] [place] [address] [phone]        Time spent on Discharge: 25 minutes  Signed:   RAI,RIPUDEEP M.D. [facility] 11/20/2015, 11:15 AM Pager: [phone]

## 2015-11-24 LAB — CULTURE, BLOOD (ROUTINE X 2)
CULTURE: NO GROWTH
Culture: NO GROWTH

## 2015-11-26 ENCOUNTER — Ambulatory Visit (INDEPENDENT_AMBULATORY_CARE_PROVIDER_SITE_OTHER): Payer: Medicare Other | Admitting: *Deleted

## 2015-11-26 DIAGNOSIS — R55 Syncope and collapse: Secondary | ICD-10-CM

## 2015-11-26 NOTE — Progress Notes (Signed)
Carelink Summary Report / Loop Recorder 

## 2015-11-29 ENCOUNTER — Ambulatory Visit (INDEPENDENT_AMBULATORY_CARE_PROVIDER_SITE_OTHER): Payer: Medicare Other | Admitting: *Deleted

## 2015-11-29 ENCOUNTER — Encounter: Payer: Self-pay | Admitting: Internal Medicine

## 2015-11-29 ENCOUNTER — Ambulatory Visit: Payer: Medicare Other

## 2015-11-29 DIAGNOSIS — N390 Urinary tract infection, site not specified: Secondary | ICD-10-CM | POA: Diagnosis not present

## 2015-11-29 DIAGNOSIS — Z950 Presence of cardiac pacemaker: Secondary | ICD-10-CM | POA: Diagnosis not present

## 2015-11-29 LAB — CUP PACEART INCLINIC DEVICE CHECK
Battery Voltage: 3.01 V
Implantable Lead Implant Date: 20170417
Implantable Lead Implant Date: 20170417
Implantable Lead Location: 753859
Lead Channel Impedance Value: 450 Ohm
Lead Channel Pacing Threshold Amplitude: 0.75 V
Lead Channel Pacing Threshold Amplitude: 0.75 V
Lead Channel Pacing Threshold Amplitude: 0.75 V
Lead Channel Pacing Threshold Pulse Width: 0.5 ms
Lead Channel Pacing Threshold Pulse Width: 0.5 ms
Lead Channel Sensing Intrinsic Amplitude: 12 mV
Lead Channel Sensing Intrinsic Amplitude: 3.1 mV
Lead Channel Setting Pacing Amplitude: 0.875
Lead Channel Setting Pacing Amplitude: 1.625
Lead Channel Setting Pacing Pulse Width: 0.5 ms
MDC IDC LEAD LOCATION: 753860
MDC IDC MSMT BATTERY REMAINING LONGEVITY: 142.8
MDC IDC MSMT LEADCHNL RA PACING THRESHOLD AMPLITUDE: 0.75 V
MDC IDC MSMT LEADCHNL RA PACING THRESHOLD PULSEWIDTH: 0.5 ms
MDC IDC MSMT LEADCHNL RA PACING THRESHOLD PULSEWIDTH: 0.5 ms
MDC IDC MSMT LEADCHNL RV IMPEDANCE VALUE: 525 Ohm
MDC IDC SESS DTM: 20170427171335
MDC IDC SET LEADCHNL RV SENSING SENSITIVITY: 2 mV
MDC IDC STAT BRADY RA PERCENT PACED: 0.12 %
MDC IDC STAT BRADY RV PERCENT PACED: 0.13 %
Pulse Gen Serial Number: 7883162

## 2015-11-29 NOTE — Progress Notes (Signed)
Wound check appointment. Steri-strips removed from PPM implant and LINQ explant sites. Wounds without redness or edema. Incision edges approximated, wound well healed.  Normal device function. Thresholds, sensing, and impedances consistent with implant measurements. Device programmed with auto capture on for extra safety margin until 3 month visit. Histogram distribution appropriate for patient and level of activity. No mode switches or high ventricular rates noted. Patient and son educated about wound care, arm mobility, lifting restrictions. ROV with GT 02/18/2016.

## 2015-12-07 ENCOUNTER — Telehealth: Payer: Self-pay | Admitting: Cardiology

## 2015-12-07 DIAGNOSIS — R55 Syncope and collapse: Secondary | ICD-10-CM | POA: Diagnosis not present

## 2015-12-07 DIAGNOSIS — I459 Conduction disorder, unspecified: Secondary | ICD-10-CM | POA: Diagnosis not present

## 2015-12-07 DIAGNOSIS — R634 Abnormal weight loss: Secondary | ICD-10-CM | POA: Diagnosis not present

## 2015-12-07 DIAGNOSIS — I10 Essential (primary) hypertension: Secondary | ICD-10-CM | POA: Diagnosis not present

## 2015-12-07 NOTE — Telephone Encounter (Signed)
Spoke w/ pt and requested that she send a manual transmission b/c her home monitor has not updated in at least 14 days.   

## 2015-12-24 DIAGNOSIS — R350 Frequency of micturition: Secondary | ICD-10-CM | POA: Diagnosis not present

## 2015-12-24 DIAGNOSIS — N390 Urinary tract infection, site not specified: Secondary | ICD-10-CM | POA: Diagnosis not present

## 2015-12-24 DIAGNOSIS — Z1231 Encounter for screening mammogram for malignant neoplasm of breast: Secondary | ICD-10-CM | POA: Diagnosis not present

## 2015-12-24 DIAGNOSIS — Z01419 Encounter for gynecological examination (general) (routine) without abnormal findings: Secondary | ICD-10-CM | POA: Diagnosis not present

## 2015-12-24 DIAGNOSIS — Z6821 Body mass index (BMI) 21.0-21.9, adult: Secondary | ICD-10-CM | POA: Diagnosis not present

## 2015-12-25 ENCOUNTER — Encounter: Payer: Medicare Other | Admitting: Internal Medicine

## 2015-12-28 ENCOUNTER — Other Ambulatory Visit: Payer: Self-pay | Admitting: Obstetrics & Gynecology

## 2015-12-28 DIAGNOSIS — R928 Other abnormal and inconclusive findings on diagnostic imaging of breast: Secondary | ICD-10-CM

## 2015-12-29 LAB — CUP PACEART REMOTE DEVICE CHECK: Date Time Interrogation Session: 20170324203949

## 2015-12-29 NOTE — Progress Notes (Signed)
Carelink summary report received. Battery status OK. Normal device function. No new symptom episodes, tachy episodes, brady, episodes. No new AF episodes. 2 pause episodes, pt now with PPM. Monthly summary reports and ROV/PRN

## 2015-12-31 LAB — CUP PACEART REMOTE DEVICE CHECK
Implantable Lead Implant Date: 20170417
Implantable Lead Location: 753859
MDC IDC LEAD IMPLANT DT: 20170417
MDC IDC LEAD LOCATION: 753860
MDC IDC SESS DTM: 20170423210651

## 2015-12-31 NOTE — Progress Notes (Signed)
Carelink summary report received. Battery status OK. Normal device function. No new symptom episodes, tachy episodes, brady episodes. No new AF episodes. 1 pause episode, pt now with PPM. Monthly summary reports and ROV/PRN

## 2016-01-04 DIAGNOSIS — R5383 Other fatigue: Secondary | ICD-10-CM | POA: Diagnosis not present

## 2016-01-04 DIAGNOSIS — I10 Essential (primary) hypertension: Secondary | ICD-10-CM | POA: Diagnosis not present

## 2016-01-04 DIAGNOSIS — Z79899 Other long term (current) drug therapy: Secondary | ICD-10-CM | POA: Diagnosis not present

## 2016-01-04 DIAGNOSIS — R634 Abnormal weight loss: Secondary | ICD-10-CM | POA: Diagnosis not present

## 2016-01-04 DIAGNOSIS — R21 Rash and other nonspecific skin eruption: Secondary | ICD-10-CM | POA: Diagnosis not present

## 2016-01-16 ENCOUNTER — Ambulatory Visit
Admission: RE | Admit: 2016-01-16 | Discharge: 2016-01-16 | Disposition: A | Discharge status: Home / Self Care | Payer: Medicare Other | Source: Ambulatory Visit | Attending: Obstetrics & Gynecology | Admitting: Obstetrics & Gynecology

## 2016-01-16 DIAGNOSIS — N6001 Solitary cyst of right breast: Secondary | ICD-10-CM | POA: Diagnosis not present

## 2016-01-16 DIAGNOSIS — R928 Other abnormal and inconclusive findings on diagnostic imaging of breast: Secondary | ICD-10-CM | POA: Diagnosis not present

## 2016-02-01 DIAGNOSIS — R413 Other amnesia: Secondary | ICD-10-CM | POA: Diagnosis not present

## 2016-02-01 DIAGNOSIS — Z79899 Other long term (current) drug therapy: Secondary | ICD-10-CM | POA: Diagnosis not present

## 2016-02-01 DIAGNOSIS — R2681 Unsteadiness on feet: Secondary | ICD-10-CM | POA: Diagnosis not present

## 2016-02-18 ENCOUNTER — Encounter: Payer: Medicare Other | Admitting: Internal Medicine

## 2016-02-20 ENCOUNTER — Telehealth: Payer: Self-pay | Admitting: Internal Medicine

## 2016-02-20 NOTE — Telephone Encounter (Signed)
Spoke with patient and she will call back to schedule he 3 month follow up with Dr Lovena Le for July or Aug

## 2016-02-20 NOTE — Telephone Encounter (Signed)
New Message  Pt requested to speak with RN. Pt was confused about when her appt was scheduled. Pt states she would like to be seen sooner than current appt set for 8/1. Please call back to discuss

## 2016-03-04 ENCOUNTER — Encounter: Payer: Medicare Other | Admitting: Physician Assistant

## 2016-03-04 DIAGNOSIS — R634 Abnormal weight loss: Secondary | ICD-10-CM | POA: Diagnosis not present

## 2016-03-04 DIAGNOSIS — H903 Sensorineural hearing loss, bilateral: Secondary | ICD-10-CM | POA: Diagnosis not present

## 2016-03-04 DIAGNOSIS — I10 Essential (primary) hypertension: Secondary | ICD-10-CM | POA: Diagnosis not present

## 2016-03-06 ENCOUNTER — Encounter: Payer: Self-pay | Admitting: Physician Assistant

## 2016-03-06 ENCOUNTER — Encounter: Payer: Medicare Other | Admitting: Physician Assistant

## 2016-03-06 ENCOUNTER — Ambulatory Visit (INDEPENDENT_AMBULATORY_CARE_PROVIDER_SITE_OTHER): Payer: Medicare Other | Admitting: Physician Assistant

## 2016-03-06 ENCOUNTER — Encounter (INDEPENDENT_AMBULATORY_CARE_PROVIDER_SITE_OTHER): Payer: Self-pay

## 2016-03-06 VITALS — BP 124/76 | HR 84 | Ht 64.0 in | Wt 126.0 lb

## 2016-03-06 DIAGNOSIS — I10 Essential (primary) hypertension: Secondary | ICD-10-CM

## 2016-03-06 DIAGNOSIS — I442 Atrioventricular block, complete: Secondary | ICD-10-CM

## 2016-03-06 DIAGNOSIS — I5022 Chronic systolic (congestive) heart failure: Secondary | ICD-10-CM

## 2016-03-06 NOTE — Patient Instructions (Addendum)
Medication Instructions:   Your physician recommends that you continue on your current medications as directed. Please refer to the Current Medication list given to you today.   If you need a refill on your cardiac medications before your next appointment, please call your pharmacy.  Labwork: NONE ORDER TODAY    Testing/Procedures: NONE ORDER TODAY    Follow-Up:  SEE TAYLOR IN 2 MONTHS   Any Other Special Instructions Will Be Listed Below (If Applicable).

## 2016-03-06 NOTE — Progress Notes (Signed)
Cardiology Office Note Date:  03/06/2016  Patient ID:  Gabrielle, Hoffman [birth year], MRN [id number] PCP:  Mathews Argyle, MD  Cardiologist/Electrophysiologist:  Dr. Lovena Le   Chief Complaint:  Routine f/u post pacer implant.  She had to reschedule her appt with Dr. Lovena Le secondary to her son having to be in the hospital.  History of Present Illness: Gabrielle Hoffman is a 82 y.o. female with history of syncope and sinus pauses, s/p PPM implant April 2017, HLD, HTN, mild memory loss, LV dysfunction (EF 45-50% with no clinical CHF), chronic LBBB, previous syncope s/p loop 08/2015 who presented to [facility] ED on 11/18/15 after an episode of syncope at the a science center and Interrogation of loop recorder showed a 7 second pause.  She comes today to be seen for Dr. Lovena Le feeling well.  She denies any further near syncope or syncope since her pacer implant.  She denies any CP, palpitations or SOB.  She is accompanied by her niece who also reports she has been doing quite well.  She denies any orthopnea or PND symptoms.  Device information: SJM PPM implanted 11/19/15, Dr. Lovena Le, SB, CHB, (ILR was removed)   Past Medical History:  Diagnosis Date  . Asthma   . DDD (degenerative disc disease), lumbar   . DEMENTIA    very mild   . Depression   . Disc disorder of cervical region   . Diverticulosis   . Hyperlipidemia   . Hypertension   . Hyponatremia 12/17/2011  . Kidney stones    "passed them"  . Memory loss   . Mitral valve prolapse   . Osteopenia   . Pneumonia    "I believe I had it a long time ago"  . Presence of permanent cardiac pacemaker   . Syncope   . UTI (lower urinary tract infection)   . Vaginal prolapse   . Venous insufficiency     Past Surgical History:  Procedure Laterality Date  . ABDOMINAL HYSTERECTOMY    . APPENDECTOMY    . EP IMPLANTABLE DEVICE N/A 08/27/2015   Procedure: Loop Recorder Insertion;  Surgeon: Evans Lance, MD;  Location: [place] [facility];   Service: Cardiovascular;  Laterality: N/A;  . EP IMPLANTABLE DEVICE N/A 11/19/2015   Procedure: Pacemaker Implant;  Surgeon: Evans Lance, MD;  Location: [place] [facility];  Service: Cardiovascular;  Laterality: N/A;  . INSERT / REPLACE / REMOVE PACEMAKER  11/19/2015  . TONSILLECTOMY      Current Outpatient Prescriptions  Medication Sig Dispense Refill  . acetaminophen (TYLENOL) 325 MG tablet Take 650 mg by mouth every 6 (six) hours as needed for mild pain, moderate pain or fever.     Marland Kitchen amLODipine (NORVASC) 5 MG tablet Take 5 mg by mouth daily.    . carvedilol (COREG) 6.25 MG tablet Take 1 tablet (6.25 mg total) by mouth 2 (two) times daily with a meal. 60 tablet 3  . lisinopril (PRINIVIL,ZESTRIL) 20 MG tablet Take 20 mg by mouth daily.    . meclizine (ANTIVERT) 12.5 MG tablet Take 12.5 mg by mouth 2 (two) times daily as needed for dizziness.     . Multiple Vitamin (MULTIVITAMIN WITH MINERALS) TABS tablet Take 1 tablet by mouth daily.    . timolol (TIMOPTIC) 0.25 % ophthalmic solution Place 1 drop into both eyes 2 (two) times daily.      No current facility-administered medications for this visit.     Allergies:   Sulfa antibiotics and Sulfamethoxazole  Social History:  The patient  reports that she has never smoked. She has never used smokeless tobacco. She reports that she does not drink alcohol or use drugs.   Family History:  The patient's family history includes Cancer in her sister.  ROS:  Please see the history of present illness.  All other systems are reviewed and otherwise negative.   PHYSICAL EXAM:  VS:  BP 124/76   Pulse 84   Ht 5\' 4"  (1.626 m)   Wt 126 lb (57.2 kg)   BMI 21.63 kg/m  BMI: Body mass index is 21.63 kg/m. Well nourished, well developed, in no acute distress  HEENT: normocephalic, atraumatic  Neck: no JVD, carotid bruits or masses Cardiac:  RRR; no significant murmurs, no rubs, or gallops Lungs:  clear to auscultation bilaterally, no wheezing,  rhonchi or rales  Abd: soft, nontender MS: no deformity or atrophy Ext: no edema  Skin: warm and dry, no rash Neuro:  No gross deficits appreciated Psych: euthymic mood, full affect  PPM site is stable, no tethering or discomfort ILR explant site is well healed   EKG:  Done today and reviewd by myself shows SR, LBBB PPM  interrogation today: normal device function, AMS episodes are all 1:1 AT, longest 29minutes (no symptoms)   07/26/15: Echocardiogram Study Conclusions - Left ventricle: The cavity size was normal. There was mild focal   basal hypertrophy of the septum. Systolic function was mildly   reduced, primarily due to systolic dyssynchrony. The estimated   ejection fraction was in the range of 45% to 50%. Wall motion was   normal; there were no regional wall motion abnormalities. There   was fusion of early and atrial contributions to ventricular   filling. The study is not technically sufficient to allow   evaluation of LV diastolic function. - Ventricular septum: Septal motion showed abnormal function,   dyssynergy, and paradox. These changes are consistent with a left   bundle branch block. - Aortic valve: There was trivial regurgitation. - Mitral valve: Calcified annulus.   Recent Labs: 11/18/2015: TSH 0.793 11/19/2015: B Natriuretic Peptide 74.0 11/20/2015: BUN 11; Creatinine, Ser 0.74; Hemoglobin 10.9; Platelets 261; Potassium 4.3; Sodium 135  No results found for requested labs within last 8760 hours.   CrCl cannot be calculated (Patient's most recent lab result is older than the maximum 21 days allowed.).   Wt Readings from Last 3 Encounters:  03/06/16 126 lb (57.2 kg)  11/20/15 128 lb (58.1 kg)  08/27/15 140 lb (63.5 kg)     Other studies reviewed: Additional studies/records reviewed today include: summarized above  ASSESSMENT AND PLAN:  1. Intermittent CHB, s/p PPM     normal device function     implant site stable and well healed ILR explant site  stable and well healed     46mo remote check  2. HTN     stable  3. Mild CM, EF 45-50%     No hx of clinic CHF     exam is compensated     weight is stable/down     On BB since her pacer implant with ACE   Disposition: will have her go ahead and see Dr. Lovena Le for a post-implant visit in the next 2-67months, sooner if needed.  Current medicines are reviewed at length with the patient today.  The patient did not have any concerns regarding medicines.  Haywood Lasso, PA-C 03/06/2016 5:19 PM     [place]  [address] [address] [phone] (office)  [phone] (fax)

## 2016-03-10 ENCOUNTER — Encounter: Payer: Self-pay | Admitting: Internal Medicine

## 2016-03-24 ENCOUNTER — Encounter: Payer: Medicare Other | Admitting: Physician Assistant

## 2016-04-12 ENCOUNTER — Encounter: Payer: Self-pay | Admitting: Internal Medicine

## 2016-04-21 ENCOUNTER — Other Ambulatory Visit: Payer: Self-pay

## 2016-04-22 ENCOUNTER — Encounter: Payer: Self-pay | Admitting: Internal Medicine

## 2016-04-22 ENCOUNTER — Ambulatory Visit (INDEPENDENT_AMBULATORY_CARE_PROVIDER_SITE_OTHER): Payer: Medicare Other | Admitting: Internal Medicine

## 2016-04-22 VITALS — BP 142/66 | HR 87 | Ht 66.0 in | Wt 124.2 lb

## 2016-04-22 DIAGNOSIS — I442 Atrioventricular block, complete: Secondary | ICD-10-CM

## 2016-04-22 DIAGNOSIS — I10 Essential (primary) hypertension: Secondary | ICD-10-CM

## 2016-04-22 DIAGNOSIS — I5022 Chronic systolic (congestive) heart failure: Secondary | ICD-10-CM

## 2016-04-22 NOTE — Patient Instructions (Addendum)
Medication Instructions:  Your physician recommends that you continue on your current medications as directed. Please refer to the Current Medication list given to you today.   Labwork: None Ordered   Testing/Procedures: None Ordered   Follow-Up: Your physician recommends that you schedule a follow-up appointment April 2018.  Remote monitoring is used to monitor your Pacemaker from home. This monitoring reduces the number of office visits required to check your device to one time per year. It allows [place] to keep an eye on the functioning of your device to ensure it is working properly. You are scheduled for a device check from home on 07/22/16. You may send your transmission at any time that day. If you have a wireless device, the transmission will be sent automatically. After your physician reviews your transmission, you will receive a postcard with your next transmission date.      Any Other Special Instructions Will Be Listed Below (If Applicable).     If you need a refill on your cardiac medications before your next appointment, please call your pharmacy.

## 2016-04-22 NOTE — Progress Notes (Signed)
HPI Gabrielle Hoffman returns today for followup. She is a pleasant 82 yo woman with LBBB and Stokes-Adams attacks, who presents for followup after undergoing PPM insertion several months ago. She has had no recurrent syncope. No chest pain or sob. No edema. She has been losing weight. Her son who is with her today thinks that she is not eating well.  Allergies  Allergen Reactions  . Sulfa Antibiotics Rash  . Sulfamethoxazole Rash     Current Outpatient Prescriptions  Medication Sig Dispense Refill  . acetaminophen (TYLENOL) 325 MG tablet Take 650 mg by mouth every 6 (six) hours as needed for mild pain, moderate pain or fever.     Marland Kitchen amLODipine (NORVASC) 5 MG tablet Take 5 mg by mouth daily.    . carvedilol (COREG) 6.25 MG tablet Take 1 tablet (6.25 mg total) by mouth 2 (two) times daily with a meal. 60 tablet 3  . lisinopril (PRINIVIL,ZESTRIL) 10 MG tablet Take 1 tablet by mouth daily.    . meclizine (ANTIVERT) 12.5 MG tablet Take 12.5 mg by mouth 2 (two) times daily as needed for dizziness.     . Multiple Vitamin (MULTIVITAMIN WITH MINERALS) TABS tablet Take 1 tablet by mouth daily.    . timolol (TIMOPTIC) 0.25 % ophthalmic solution Place 1 drop into both eyes 2 (two) times daily.      No current facility-administered medications for this visit.      Past Medical History:  Diagnosis Date  . Asthma   . DDD (degenerative disc disease), lumbar   . DEMENTIA    very mild   . Depression   . Disc disorder of cervical region   . Diverticulosis   . Hyperlipidemia   . Hypertension   . Hyponatremia 12/17/2011  . Kidney stones    "passed them"  . Memory loss   . Mitral valve prolapse   . Osteopenia   . Pneumonia    "I believe I had it a long time ago"  . Presence of permanent cardiac pacemaker   . Syncope   . UTI (lower urinary tract infection)   . Vaginal prolapse   . Venous insufficiency     ROS:   All systems reviewed and negative except as noted in the HPI.   Past  Surgical History:  Procedure Laterality Date  . ABDOMINAL HYSTERECTOMY    . APPENDECTOMY    . EP IMPLANTABLE DEVICE N/A 08/27/2015   Procedure: Loop Recorder Insertion;  Surgeon: Evans Lance, MD;  Location: [place] [facility];  Service: Cardiovascular;  Laterality: N/A;  . EP IMPLANTABLE DEVICE N/A 11/19/2015   Procedure: Pacemaker Implant;  Surgeon: Evans Lance, MD;  Location: [place] [facility];  Service: Cardiovascular;  Laterality: N/A;  . INSERT / REPLACE / REMOVE PACEMAKER  11/19/2015  . TONSILLECTOMY       Family History  Problem Relation Age of Onset  . Cancer Sister      Social History   Social History  . Marital status: Widowed    Spouse name: N/A  . Number of children: N/A  . Years of education: N/A   Occupational History  . Not on file.   Social History Main Topics  . Smoking status: Never Smoker  . Smokeless tobacco: Never Used  . Alcohol use No  . Drug use: No  . Sexual activity: Not Currently    Birth control/ protection: Post-menopausal   Other Topics Concern  . Not on file  Social History Narrative  . No narrative on file     BP (!) 142/66   Pulse 87   Ht 5\' 6"  (1.676 m)   Wt 124 lb 3.2 oz (56.3 kg)   BMI 20.05 kg/m   Physical Exam:  Well appearing 82 yo woman, NAD HEENT: Unremarkable Neck:  6 cm JVD, no thyromegally Lymphatics:  No adenopathy Back:  No CVA tenderness Lungs:  Clear with no wheezes HEART:  Regular rate rhythm, no murmurs, no rubs, no clicks Abd:  soft, positive bowel sounds, no organomegally, no rebound, no guarding Ext:  2 plus pulses, no edema, no cyanosis, no clubbing Skin:  No rashes no nodules Neuro:  CN II through XII intact, motor grossly intact  DEVICE  Normal device function.  See PaceArt for details.   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  2. PPM - her St. Jude device is working normally. Will recheck in several months. 3. Weight loss -  she admits to not eating enough. I have asked her to try and eat high calorie foods. 4. Chronic systolic heart failure - her symptoms are class 2. She has only mild LV dysfunction.  Gabrielle Hoffman.D.

## 2016-04-23 DIAGNOSIS — H52223 Regular astigmatism, bilateral: Secondary | ICD-10-CM | POA: Diagnosis not present

## 2016-04-23 DIAGNOSIS — H401111 Primary open-angle glaucoma, right eye, mild stage: Secondary | ICD-10-CM | POA: Diagnosis not present

## 2016-04-23 DIAGNOSIS — H5203 Hypermetropia, bilateral: Secondary | ICD-10-CM | POA: Diagnosis not present

## 2016-04-23 DIAGNOSIS — H401121 Primary open-angle glaucoma, left eye, mild stage: Secondary | ICD-10-CM | POA: Diagnosis not present

## 2016-04-23 LAB — CUP PACEART INCLINIC DEVICE CHECK
Battery Remaining Longevity: 134.4
Battery Voltage: 3.02 V
Brady Statistic RV Percent Paced: 0.07 %
Implantable Lead Implant Date: 20170417
Implantable Lead Location: 753859
Lead Channel Impedance Value: 450 Ohm
Lead Channel Impedance Value: 487.5 Ohm
Lead Channel Pacing Threshold Amplitude: 0.5 V
Lead Channel Pacing Threshold Pulse Width: 0.5 ms
Lead Channel Sensing Intrinsic Amplitude: 2.4 mV
Lead Channel Setting Pacing Amplitude: 1 V
Lead Channel Setting Sensing Sensitivity: 2 mV
MDC IDC LEAD IMPLANT DT: 20170417
MDC IDC LEAD LOCATION: 753860
MDC IDC MSMT LEADCHNL RV PACING THRESHOLD AMPLITUDE: 0.75 V
MDC IDC MSMT LEADCHNL RV PACING THRESHOLD PULSEWIDTH: 0.5 ms
MDC IDC MSMT LEADCHNL RV SENSING INTR AMPL: 12 mV
MDC IDC PG SERIAL: 7883162
MDC IDC SESS DTM: 20170919202135
MDC IDC SET LEADCHNL RA PACING AMPLITUDE: 1.5 V
MDC IDC SET LEADCHNL RV PACING PULSEWIDTH: 0.5 ms
MDC IDC STAT BRADY RA PERCENT PACED: 0.17 %

## 2016-05-01 DIAGNOSIS — Z23 Encounter for immunization: Secondary | ICD-10-CM | POA: Diagnosis not present

## 2016-05-07 ENCOUNTER — Encounter: Payer: Medicare Other | Admitting: Internal Medicine

## 2016-06-16 DIAGNOSIS — Z0000 Encounter for general adult medical examination without abnormal findings: Secondary | ICD-10-CM | POA: Diagnosis not present

## 2016-06-16 DIAGNOSIS — Z79899 Other long term (current) drug therapy: Secondary | ICD-10-CM | POA: Diagnosis not present

## 2016-06-16 DIAGNOSIS — F325 Major depressive disorder, single episode, in full remission: Secondary | ICD-10-CM | POA: Diagnosis not present

## 2016-06-16 DIAGNOSIS — Z1389 Encounter for screening for other disorder: Secondary | ICD-10-CM | POA: Diagnosis not present

## 2016-06-16 DIAGNOSIS — I10 Essential (primary) hypertension: Secondary | ICD-10-CM | POA: Diagnosis not present

## 2016-07-22 ENCOUNTER — Ambulatory Visit (INDEPENDENT_AMBULATORY_CARE_PROVIDER_SITE_OTHER): Payer: Medicare Other | Admitting: *Deleted

## 2016-07-22 ENCOUNTER — Telehealth: Payer: Self-pay | Admitting: Cardiology

## 2016-07-22 DIAGNOSIS — I442 Atrioventricular block, complete: Secondary | ICD-10-CM

## 2016-07-22 NOTE — Telephone Encounter (Signed)
Spoke with pt and reminded pt of remote transmission that is due today. Pt verbalized understanding.   

## 2016-07-22 NOTE — Progress Notes (Signed)
Remote pacemaker transmission.   

## 2016-07-23 ENCOUNTER — Encounter: Payer: Self-pay | Admitting: Cardiology

## 2016-07-30 LAB — CUP PACEART REMOTE DEVICE CHECK
Battery Remaining Longevity: 128 mo
Battery Voltage: 3.01 V
Brady Statistic AP VS Percent: 1 %
Brady Statistic AS VP Percent: 1 %
Brady Statistic RA Percent Paced: 1 %
Implantable Lead Location: 753860
Lead Channel Impedance Value: 440 Ohm
Lead Channel Pacing Threshold Amplitude: 0.5 V
Lead Channel Pacing Threshold Pulse Width: 0.5 ms
Lead Channel Sensing Intrinsic Amplitude: 1.6 mV
Lead Channel Sensing Intrinsic Amplitude: 12 mV
MDC IDC LEAD IMPLANT DT: 20170417
MDC IDC LEAD IMPLANT DT: 20170417
MDC IDC LEAD LOCATION: 753859
MDC IDC MSMT BATTERY REMAINING PERCENTAGE: 95.5 %
MDC IDC MSMT LEADCHNL RV IMPEDANCE VALUE: 480 Ohm
MDC IDC MSMT LEADCHNL RV PACING THRESHOLD AMPLITUDE: 0.625 V
MDC IDC MSMT LEADCHNL RV PACING THRESHOLD PULSEWIDTH: 0.5 ms
MDC IDC PG IMPLANT DT: 20170417
MDC IDC PG SERIAL: 7883162
MDC IDC SESS DTM: 20171219190836
MDC IDC SET LEADCHNL RA PACING AMPLITUDE: 1.5 V
MDC IDC SET LEADCHNL RV PACING AMPLITUDE: 0.875
MDC IDC SET LEADCHNL RV PACING PULSEWIDTH: 0.5 ms
MDC IDC SET LEADCHNL RV SENSING SENSITIVITY: 2 mV
MDC IDC STAT BRADY AP VP PERCENT: 1 %
MDC IDC STAT BRADY AS VS PERCENT: 99 %
MDC IDC STAT BRADY RV PERCENT PACED: 1 %

## 2016-08-05 DIAGNOSIS — I10 Essential (primary) hypertension: Secondary | ICD-10-CM | POA: Diagnosis not present

## 2016-08-05 DIAGNOSIS — R634 Abnormal weight loss: Secondary | ICD-10-CM | POA: Diagnosis not present

## 2016-09-19 DIAGNOSIS — I10 Essential (primary) hypertension: Secondary | ICD-10-CM | POA: Diagnosis not present

## 2016-10-21 ENCOUNTER — Ambulatory Visit (INDEPENDENT_AMBULATORY_CARE_PROVIDER_SITE_OTHER): Payer: Medicare Other | Admitting: *Deleted

## 2016-10-21 DIAGNOSIS — I442 Atrioventricular block, complete: Secondary | ICD-10-CM

## 2016-10-21 NOTE — Progress Notes (Signed)
Remote pacemaker transmission.   

## 2016-10-22 ENCOUNTER — Encounter: Payer: Self-pay | Admitting: Cardiology

## 2016-10-22 DIAGNOSIS — H401121 Primary open-angle glaucoma, left eye, mild stage: Secondary | ICD-10-CM | POA: Diagnosis not present

## 2016-10-22 DIAGNOSIS — H25813 Combined forms of age-related cataract, bilateral: Secondary | ICD-10-CM | POA: Diagnosis not present

## 2016-10-22 DIAGNOSIS — H401111 Primary open-angle glaucoma, right eye, mild stage: Secondary | ICD-10-CM | POA: Diagnosis not present

## 2016-10-22 DIAGNOSIS — H52222 Regular astigmatism, left eye: Secondary | ICD-10-CM | POA: Diagnosis not present

## 2016-10-22 DIAGNOSIS — H5202 Hypermetropia, left eye: Secondary | ICD-10-CM | POA: Diagnosis not present

## 2016-10-22 LAB — CUP PACEART REMOTE DEVICE CHECK
Battery Remaining Percentage: 95.5 %
Battery Voltage: 3.01 V
Brady Statistic AP VS Percent: 1 %
Brady Statistic AS VS Percent: 99 %
Date Time Interrogation Session: 20180320060014
Implantable Lead Implant Date: 20170417
Implantable Lead Location: 753860
Implantable Pulse Generator Implant Date: 20170417
Lead Channel Pacing Threshold Amplitude: 0.5 V
Lead Channel Pacing Threshold Amplitude: 0.75 V
Lead Channel Pacing Threshold Pulse Width: 0.5 ms
Lead Channel Sensing Intrinsic Amplitude: 1 mV
Lead Channel Setting Pacing Amplitude: 1 V
MDC IDC LEAD IMPLANT DT: 20170417
MDC IDC LEAD LOCATION: 753859
MDC IDC MSMT BATTERY REMAINING LONGEVITY: 125 mo
MDC IDC MSMT LEADCHNL RA IMPEDANCE VALUE: 440 Ohm
MDC IDC MSMT LEADCHNL RA PACING THRESHOLD PULSEWIDTH: 0.5 ms
MDC IDC MSMT LEADCHNL RV IMPEDANCE VALUE: 490 Ohm
MDC IDC MSMT LEADCHNL RV SENSING INTR AMPL: 12 mV
MDC IDC SET LEADCHNL RA PACING AMPLITUDE: 1.5 V
MDC IDC SET LEADCHNL RV PACING PULSEWIDTH: 0.5 ms
MDC IDC SET LEADCHNL RV SENSING SENSITIVITY: 2 mV
MDC IDC STAT BRADY AP VP PERCENT: 1 %
MDC IDC STAT BRADY AS VP PERCENT: 1 %
MDC IDC STAT BRADY RA PERCENT PACED: 1 %
MDC IDC STAT BRADY RV PERCENT PACED: 1 %
Pulse Gen Model: 2272
Pulse Gen Serial Number: 7883162

## 2016-10-29 ENCOUNTER — Encounter: Payer: Self-pay | Admitting: Internal Medicine

## 2016-11-10 ENCOUNTER — Encounter (INDEPENDENT_AMBULATORY_CARE_PROVIDER_SITE_OTHER): Payer: Self-pay

## 2016-11-10 ENCOUNTER — Ambulatory Visit (INDEPENDENT_AMBULATORY_CARE_PROVIDER_SITE_OTHER): Payer: Medicare Other | Admitting: Internal Medicine

## 2016-11-10 ENCOUNTER — Encounter: Payer: Self-pay | Admitting: Internal Medicine

## 2016-11-10 DIAGNOSIS — I5022 Chronic systolic (congestive) heart failure: Secondary | ICD-10-CM

## 2016-11-10 NOTE — Patient Instructions (Signed)
Medication Instructions:  Your physician recommends that you continue on your current medications as directed. Please refer to the Current Medication list given to you today.   Labwork: None Ordered   Testing/Procedures: None Ordered   Follow-Up: Remote monitoring is used to monitor your Pacemaker from home. This monitoring reduces the number of office visits required to check your device to one time per year. It allows [place] to keep an eye on the functioning of your device to ensure it is working properly. You are scheduled for a device check from home on 02/09/17.. You may send your transmission at any time that day. If you have a wireless device, the transmission will be sent automatically. After your physician reviews your transmission, you will receive a postcard with your next transmission date.  Your physician wants you to follow-up in: 1 year with Dr. Lovena Le.  You will receive a reminder letter in the mail two months in advance. If you don't receive a letter, please call our office to schedule the follow-up appointment.   If you need a refill on your cardiac medications before your next appointment, please call your pharmacy.   Thank you for choosing [facility]! Christen Bame, RN [phone]

## 2016-11-10 NOTE — Progress Notes (Signed)
HPI Mrs. Dovidio returns today for followup. She is a pleasant 83 yo woman with LBBB and Stokes-Adams attacks, who presents for followup after undergoing PPM insertion over a year ago. She has had no recurrent syncope. No chest pain or sob. No edema. She has been losing weight. Her son who is with her today thinks that she is not eating well. He has lots of questions.  Allergies  Allergen Reactions  . Sulfa Antibiotics Rash  . Sulfamethoxazole Rash     Current Outpatient Prescriptions  Medication Sig Dispense Refill  . acetaminophen (TYLENOL) 325 MG tablet Take 650 mg by mouth every 6 (six) hours as needed for mild pain, moderate pain or fever.     Marland Kitchen amLODipine (NORVASC) 5 MG tablet Take 5 mg by mouth daily.    . carvedilol (COREG) 6.25 MG tablet Take 1 tablet (6.25 mg total) by mouth 2 (two) times daily with a meal. 60 tablet 3  . meclizine (ANTIVERT) 12.5 MG tablet Take 12.5 mg by mouth 2 (two) times daily as needed for dizziness.     . Multiple Vitamin (MULTIVITAMIN WITH MINERALS) TABS tablet Take 1 tablet by mouth daily.    . timolol (TIMOPTIC) 0.25 % ophthalmic solution Place 1 drop into both eyes 2 (two) times daily.      No current facility-administered medications for this visit.      Past Medical History:  Diagnosis Date  . Asthma   . DDD (degenerative disc disease), lumbar   . DEMENTIA    very mild   . Depression   . Disc disorder of cervical region   . Diverticulosis   . Hyperlipidemia   . Hypertension   . Hyponatremia 12/17/2011  . Kidney stones    "passed them"  . Memory loss   . Mitral valve prolapse   . Osteopenia   . Pneumonia    "I believe I had it a long time ago"  . Presence of permanent cardiac pacemaker   . Syncope   . UTI (lower urinary tract infection)   . Vaginal prolapse   . Venous insufficiency     ROS:   All systems reviewed and negative except as noted in the HPI.   Past Surgical History:  Procedure Laterality Date  .  ABDOMINAL HYSTERECTOMY    . APPENDECTOMY    . EP IMPLANTABLE DEVICE N/A 08/27/2015   Procedure: Loop Recorder Insertion;  Surgeon: Evans Lance, MD;  Location: [place] [facility];  Service: Cardiovascular;  Laterality: N/A;  . EP IMPLANTABLE DEVICE N/A 11/19/2015   Procedure: Pacemaker Implant;  Surgeon: Evans Lance, MD;  Location: [place] [facility];  Service: Cardiovascular;  Laterality: N/A;  . INSERT / REPLACE / REMOVE PACEMAKER  11/19/2015  . TONSILLECTOMY       Family History  Problem Relation Age of Onset  . Cancer Sister      Social History   Social History  . Marital status: Widowed    Spouse name: N/A  . Number of children: N/A  . Years of education: N/A   Occupational History  . Not on file.   Social History Main Topics  . Smoking status: Never Smoker  . Smokeless tobacco: Never Used  . Alcohol use No  . Drug use: No  . Sexual activity: Not Currently    Birth control/ protection: Post-menopausal   Other Topics Concern  . Not on file   Social History Narrative  . No narrative on file  BP (!) 144/70   Pulse 76   Ht 5\' 6"  (1.676 m)   Wt 126 lb 3.2 oz (57.2 kg)   BMI 20.37 kg/m   Physical Exam:  frail appearing 83 yo woman, NAD HEENT: Unremarkable Neck:  6 cm JVD, no thyromegally Lymphatics:  No adenopathy Back:  No CVA tenderness Lungs:  Clear with no wheezes HEART:  Regular rate rhythm, no murmurs, no rubs, no clicks Abd:  soft, positive bowel sounds, no organomegally, no rebound, no guarding Ext:  2 plus pulses, no edema, no cyanosis, no clubbing Skin:  No rashes no nodules Neuro:  CN II through XII intact, motor grossly intact  DEVICE  Normal device function.  See PaceArt for details.   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  2. PPM - her St. Jude device is working normally. Will recheck in several months. 3. Weight loss - she admits to not eating enough. I have asked her  to try and eat high calorie foods. 4. Chronic systolic heart failure - her symptoms are class 2. She has only mild LV dysfunction.  [name]

## 2016-11-14 LAB — CUP PACEART INCLINIC DEVICE CHECK
Battery Voltage: 3.01 V
Brady Statistic RA Percent Paced: 0.14 %
Brady Statistic RV Percent Paced: 0.07 %
Date Time Interrogation Session: 20180409203822
Implantable Lead Implant Date: 20170417
Implantable Lead Implant Date: 20170417
Implantable Lead Location: 753859
Implantable Lead Location: 753860
Lead Channel Impedance Value: 437.5 Ohm
Lead Channel Impedance Value: 487.5 Ohm
Lead Channel Pacing Threshold Amplitude: 0.5 V
Lead Channel Pacing Threshold Pulse Width: 0.5 ms
Lead Channel Pacing Threshold Pulse Width: 0.5 ms
Lead Channel Setting Pacing Amplitude: 1 V
Lead Channel Setting Pacing Amplitude: 1.375
Lead Channel Setting Pacing Pulse Width: 0.5 ms
Lead Channel Setting Sensing Sensitivity: 2 mV
MDC IDC MSMT LEADCHNL RA SENSING INTR AMPL: 1.8 mV
MDC IDC MSMT LEADCHNL RV PACING THRESHOLD AMPLITUDE: 0.75 V
MDC IDC MSMT LEADCHNL RV SENSING INTR AMPL: 12 mV
MDC IDC PG IMPLANT DT: 20170417
Pulse Gen Serial Number: 7883162

## 2016-11-19 DIAGNOSIS — M79675 Pain in left toe(s): Secondary | ICD-10-CM | POA: Diagnosis not present

## 2016-11-19 DIAGNOSIS — I10 Essential (primary) hypertension: Secondary | ICD-10-CM | POA: Diagnosis not present

## 2016-12-24 DIAGNOSIS — Z1272 Encounter for screening for malignant neoplasm of vagina: Secondary | ICD-10-CM | POA: Diagnosis not present

## 2016-12-24 DIAGNOSIS — N390 Urinary tract infection, site not specified: Secondary | ICD-10-CM | POA: Diagnosis not present

## 2016-12-24 DIAGNOSIS — Z1231 Encounter for screening mammogram for malignant neoplasm of breast: Secondary | ICD-10-CM | POA: Diagnosis not present

## 2016-12-24 DIAGNOSIS — Z6821 Body mass index (BMI) 21.0-21.9, adult: Secondary | ICD-10-CM | POA: Diagnosis not present

## 2016-12-24 DIAGNOSIS — Z124 Encounter for screening for malignant neoplasm of cervix: Secondary | ICD-10-CM | POA: Diagnosis not present

## 2017-01-08 DIAGNOSIS — N390 Urinary tract infection, site not specified: Secondary | ICD-10-CM | POA: Diagnosis not present

## 2017-01-12 DIAGNOSIS — N390 Urinary tract infection, site not specified: Secondary | ICD-10-CM | POA: Diagnosis not present

## 2017-02-09 ENCOUNTER — Ambulatory Visit (INDEPENDENT_AMBULATORY_CARE_PROVIDER_SITE_OTHER): Payer: Medicare Other | Admitting: *Deleted

## 2017-02-09 DIAGNOSIS — I442 Atrioventricular block, complete: Secondary | ICD-10-CM | POA: Diagnosis not present

## 2017-02-10 NOTE — Progress Notes (Signed)
Remote pacemaker transmission.   

## 2017-02-11 LAB — CUP PACEART REMOTE DEVICE CHECK
Battery Voltage: 3.01 V
Brady Statistic AP VP Percent: 1 %
Brady Statistic AP VS Percent: 1 %
Brady Statistic AS VP Percent: 1 %
Brady Statistic AS VS Percent: 99 %
Brady Statistic RA Percent Paced: 1 %
Date Time Interrogation Session: 20180709083105
Implantable Lead Location: 753859
Implantable Pulse Generator Implant Date: 20170417
Lead Channel Impedance Value: 460 Ohm
Lead Channel Pacing Threshold Amplitude: 0.75 V
Lead Channel Pacing Threshold Pulse Width: 0.5 ms
Lead Channel Sensing Intrinsic Amplitude: 12 mV
Lead Channel Setting Pacing Amplitude: 1 V
Lead Channel Setting Pacing Pulse Width: 0.5 ms
MDC IDC LEAD IMPLANT DT: 20170417
MDC IDC LEAD IMPLANT DT: 20170417
MDC IDC LEAD LOCATION: 753860
MDC IDC MSMT BATTERY REMAINING LONGEVITY: 133 mo
MDC IDC MSMT BATTERY REMAINING PERCENTAGE: 95.5 %
MDC IDC MSMT LEADCHNL RA IMPEDANCE VALUE: 440 Ohm
MDC IDC MSMT LEADCHNL RA PACING THRESHOLD AMPLITUDE: 0.5 V
MDC IDC MSMT LEADCHNL RA PACING THRESHOLD PULSEWIDTH: 0.5 ms
MDC IDC MSMT LEADCHNL RA SENSING INTR AMPL: 1.3 mV
MDC IDC SET LEADCHNL RA PACING AMPLITUDE: 1.5 V
MDC IDC SET LEADCHNL RV SENSING SENSITIVITY: 2 mV
MDC IDC STAT BRADY RV PERCENT PACED: 1 %
Pulse Gen Model: 2272
Pulse Gen Serial Number: 7883162

## 2017-02-16 ENCOUNTER — Encounter: Payer: Self-pay | Admitting: Cardiology

## 2017-03-24 DIAGNOSIS — M79672 Pain in left foot: Secondary | ICD-10-CM | POA: Diagnosis not present

## 2017-03-24 DIAGNOSIS — I10 Essential (primary) hypertension: Secondary | ICD-10-CM | POA: Diagnosis not present

## 2017-03-24 DIAGNOSIS — R634 Abnormal weight loss: Secondary | ICD-10-CM | POA: Diagnosis not present

## 2017-03-24 DIAGNOSIS — Z79899 Other long term (current) drug therapy: Secondary | ICD-10-CM | POA: Diagnosis not present

## 2017-03-24 DIAGNOSIS — Z23 Encounter for immunization: Secondary | ICD-10-CM | POA: Diagnosis not present

## 2017-04-15 DIAGNOSIS — H524 Presbyopia: Secondary | ICD-10-CM | POA: Diagnosis not present

## 2017-04-15 DIAGNOSIS — H401131 Primary open-angle glaucoma, bilateral, mild stage: Secondary | ICD-10-CM | POA: Diagnosis not present

## 2017-04-15 DIAGNOSIS — H52223 Regular astigmatism, bilateral: Secondary | ICD-10-CM | POA: Diagnosis not present

## 2017-04-15 DIAGNOSIS — H5203 Hypermetropia, bilateral: Secondary | ICD-10-CM | POA: Diagnosis not present

## 2017-04-15 DIAGNOSIS — H2513 Age-related nuclear cataract, bilateral: Secondary | ICD-10-CM | POA: Diagnosis not present

## 2017-04-30 DIAGNOSIS — Z23 Encounter for immunization: Secondary | ICD-10-CM | POA: Diagnosis not present

## 2017-05-03 DIAGNOSIS — F325 Major depressive disorder, single episode, in full remission: Secondary | ICD-10-CM | POA: Diagnosis not present

## 2017-05-03 DIAGNOSIS — I10 Essential (primary) hypertension: Secondary | ICD-10-CM | POA: Diagnosis not present

## 2017-05-11 ENCOUNTER — Ambulatory Visit (INDEPENDENT_AMBULATORY_CARE_PROVIDER_SITE_OTHER): Payer: Medicare Other | Admitting: *Deleted

## 2017-05-11 DIAGNOSIS — I442 Atrioventricular block, complete: Secondary | ICD-10-CM | POA: Diagnosis not present

## 2017-05-12 NOTE — Progress Notes (Signed)
Remote pacemaker transmission.   

## 2017-05-15 ENCOUNTER — Encounter: Payer: Self-pay | Admitting: Cardiology

## 2017-06-01 LAB — CUP PACEART REMOTE DEVICE CHECK
Brady Statistic AP VS Percent: 1 %
Brady Statistic AS VP Percent: 1 %
Brady Statistic RA Percent Paced: 1 %
Brady Statistic RV Percent Paced: 1 %
Date Time Interrogation Session: 20181008060018
Implantable Lead Location: 753859
Implantable Pulse Generator Implant Date: 20170417
Lead Channel Pacing Threshold Amplitude: 0.625 V
Lead Channel Pacing Threshold Pulse Width: 0.5 ms
Lead Channel Sensing Intrinsic Amplitude: 12 mV
Lead Channel Setting Pacing Amplitude: 0.875
Lead Channel Setting Pacing Amplitude: 1.5 V
Lead Channel Setting Pacing Pulse Width: 0.5 ms
MDC IDC LEAD IMPLANT DT: 20170417
MDC IDC LEAD IMPLANT DT: 20170417
MDC IDC LEAD LOCATION: 753860
MDC IDC MSMT BATTERY REMAINING LONGEVITY: 133 mo
MDC IDC MSMT BATTERY REMAINING PERCENTAGE: 95.5 %
MDC IDC MSMT BATTERY VOLTAGE: 3.01 V
MDC IDC MSMT LEADCHNL RA IMPEDANCE VALUE: 430 Ohm
MDC IDC MSMT LEADCHNL RA PACING THRESHOLD AMPLITUDE: 0.5 V
MDC IDC MSMT LEADCHNL RA SENSING INTR AMPL: 0.9 mV
MDC IDC MSMT LEADCHNL RV IMPEDANCE VALUE: 480 Ohm
MDC IDC MSMT LEADCHNL RV PACING THRESHOLD PULSEWIDTH: 0.5 ms
MDC IDC SET LEADCHNL RV SENSING SENSITIVITY: 2 mV
MDC IDC STAT BRADY AP VP PERCENT: 1 %
MDC IDC STAT BRADY AS VS PERCENT: 99 %
Pulse Gen Model: 2272
Pulse Gen Serial Number: 7883162

## 2017-07-30 DIAGNOSIS — R300 Dysuria: Secondary | ICD-10-CM | POA: Diagnosis not present

## 2017-08-10 ENCOUNTER — Ambulatory Visit (INDEPENDENT_AMBULATORY_CARE_PROVIDER_SITE_OTHER): Payer: Medicare Other | Admitting: *Deleted

## 2017-08-10 DIAGNOSIS — I442 Atrioventricular block, complete: Secondary | ICD-10-CM | POA: Diagnosis not present

## 2017-08-10 NOTE — Progress Notes (Signed)
Remote pacemaker transmission.   

## 2017-08-11 ENCOUNTER — Encounter: Payer: Self-pay | Admitting: Cardiology

## 2017-08-17 LAB — CUP PACEART REMOTE DEVICE CHECK
Battery Remaining Percentage: 95.5 %
Brady Statistic AP VP Percent: 1 %
Brady Statistic AP VS Percent: 1 %
Brady Statistic AS VP Percent: 1 %
Brady Statistic AS VS Percent: 99 %
Brady Statistic RV Percent Paced: 1 %
Date Time Interrogation Session: 20190107075614
Implantable Lead Implant Date: 20170417
Lead Channel Pacing Threshold Amplitude: 0.75 V
Lead Channel Pacing Threshold Pulse Width: 0.5 ms
Lead Channel Sensing Intrinsic Amplitude: 0.6 mV
Lead Channel Sensing Intrinsic Amplitude: 12 mV
Lead Channel Setting Pacing Amplitude: 1 V
Lead Channel Setting Pacing Amplitude: 1.375
Lead Channel Setting Pacing Pulse Width: 0.5 ms
Lead Channel Setting Sensing Sensitivity: 2 mV
MDC IDC LEAD IMPLANT DT: 20170417
MDC IDC LEAD LOCATION: 753859
MDC IDC LEAD LOCATION: 753860
MDC IDC MSMT BATTERY REMAINING LONGEVITY: 132 mo
MDC IDC MSMT BATTERY VOLTAGE: 3.01 V
MDC IDC MSMT LEADCHNL RA IMPEDANCE VALUE: 430 Ohm
MDC IDC MSMT LEADCHNL RA PACING THRESHOLD AMPLITUDE: 0.375 V
MDC IDC MSMT LEADCHNL RA PACING THRESHOLD PULSEWIDTH: 0.5 ms
MDC IDC MSMT LEADCHNL RV IMPEDANCE VALUE: 450 Ohm
MDC IDC PG IMPLANT DT: 20170417
MDC IDC STAT BRADY RA PERCENT PACED: 1 %
Pulse Gen Model: 2272
Pulse Gen Serial Number: 7883162

## 2017-08-24 DIAGNOSIS — I10 Essential (primary) hypertension: Secondary | ICD-10-CM | POA: Diagnosis not present

## 2017-08-24 DIAGNOSIS — Z79899 Other long term (current) drug therapy: Secondary | ICD-10-CM | POA: Diagnosis not present

## 2017-08-24 DIAGNOSIS — R269 Unspecified abnormalities of gait and mobility: Secondary | ICD-10-CM | POA: Diagnosis not present

## 2017-08-24 DIAGNOSIS — R413 Other amnesia: Secondary | ICD-10-CM | POA: Diagnosis not present

## 2017-08-26 ENCOUNTER — Other Ambulatory Visit: Payer: Self-pay | Admitting: Geriatric Medicine

## 2017-08-26 DIAGNOSIS — R269 Unspecified abnormalities of gait and mobility: Secondary | ICD-10-CM

## 2017-09-01 ENCOUNTER — Other Ambulatory Visit: Payer: Medicare Other

## 2017-09-01 ENCOUNTER — Ambulatory Visit
Admission: RE | Admit: 2017-09-01 | Discharge: 2017-09-01 | Disposition: A | Discharge status: Home / Self Care | Payer: Medicare Other | Source: Ambulatory Visit | Attending: Geriatric Medicine | Admitting: Geriatric Medicine

## 2017-09-01 DIAGNOSIS — R269 Unspecified abnormalities of gait and mobility: Secondary | ICD-10-CM | POA: Diagnosis not present

## 2017-09-01 MED ORDER — IOPAMIDOL (ISOVUE-300) INJECTION 61%
75.0000 mL | Freq: Once | INTRAVENOUS | Status: AC | PRN
  Administered 2017-09-01: 75 mL via INTRAVENOUS

## 2017-09-14 DIAGNOSIS — F0280 Dementia in other diseases classified elsewhere without behavioral disturbance: Secondary | ICD-10-CM | POA: Diagnosis not present

## 2017-09-14 DIAGNOSIS — G301 Alzheimer's disease with late onset: Secondary | ICD-10-CM | POA: Diagnosis not present

## 2017-10-21 DIAGNOSIS — F325 Major depressive disorder, single episode, in full remission: Secondary | ICD-10-CM | POA: Diagnosis not present

## 2017-10-21 DIAGNOSIS — I10 Essential (primary) hypertension: Secondary | ICD-10-CM | POA: Diagnosis not present

## 2017-10-21 DIAGNOSIS — F0280 Dementia in other diseases classified elsewhere without behavioral disturbance: Secondary | ICD-10-CM | POA: Diagnosis not present

## 2017-10-26 DIAGNOSIS — F0390 Unspecified dementia without behavioral disturbance: Secondary | ICD-10-CM | POA: Diagnosis not present

## 2017-10-26 DIAGNOSIS — I10 Essential (primary) hypertension: Secondary | ICD-10-CM | POA: Diagnosis not present

## 2017-10-27 ENCOUNTER — Other Ambulatory Visit: Payer: Self-pay

## 2017-10-27 NOTE — Patient Outreach (Signed)
[facility]) Care Management  10/27/2017  DENNISSE SWADER April 11, 1931 [id number]   Telephone Screen  Referral Date:10/27/17 Referral Source: MD office (Dr. Felipa Eth) Referral Reason: "HTN, nursing-med compliance, SW-assisted living placement" Insurance: Medicare   Outreach attempt # 1 to patient. Spoke with patient and briefly discussed referral. Patient states she does have some forgetfulness at times and attributes it to her age. She did request to write down RN CM contact info and to have her son call back once he returns home. RN CM provided contact info.     Plan: RN CM will make outreach attempt to patient within 3-4 business days if no return call.    Enzo [place], RN,BSN,CCM [place] Management Telephonic Care Management Coordinator Direct Phone: [phone] Toll Free: [phone] Fax: [phone]

## 2017-10-28 ENCOUNTER — Other Ambulatory Visit: Payer: Self-pay

## 2017-10-28 NOTE — Patient Outreach (Addendum)
[facility]) Care Management  10/28/2017  Gabrielle Hoffman 1930/10/26 [id number]    Telephone Screen  Referral Date:10/27/17 Referral Source: MD office (Dr. Felipa Eth) Referral Reason: "HTN, nursing-med compliance, SW-assisted living placement" Insurance: Medicare    Outreach attempt #2 to patient/son. Spoke with son per patient request.   Social: Patient resides in her home along with her son. Son voices that he has multiple medical issues of his own right now which is limiting his ability to assist caring for his mother. He voices that he is overwhelmed by having to care for himself and his mother. Son interested and inquiring about any resources or support that he may be able to get to help his manage his mother and her medical care. He states that they are both not interested in any type of placement options at this time but are aware that this may be needed further down the road. He reports that patient requires some assistance with ADLs and IADLs. He voices that he takes patient to appts but this is a challenge for him as he is also trying to work a part time job. He is inquiring about any transportation resources that may be available to assist with transporting patient. He denies patient having had any falls in the home lately. He voices that there is a walker and cane in the attic that is not in use. DME patient currently using include shower and BP monitor. Patient is currently getting MOWs.   Conditions: Per chart review, patient has PMH of HTN, Pacer, DDD, depression, vaginal prolapse, HLD, mitral valve prolapse and probable late onset Alzheimer's disease. Son voices that patient has had ongoing issues with BP. They have a monitor in the home but are not consistently checking and unable to provide a reason other than he is too busy. He states that MD office has been having a nurse come out every couple of weeks to check patient's BP. He also voices that she has had  several med adjustments to help better manage BP. Son seems overwhelmed by all that is going with patient and voices he needs support and assistance to be able to manage her conditions. He reports that her memory is getting worse which is complicating the challenges.    Medications: Per son patient only taking two pills along with her eye drops. He voices that patient was managing her own meds. He stets he has gotten a pill box and has been filling it for patient. He states he is unsure if this is working as it has only been two days. Discussed MD concerns regarding med compliance as son sonded very vague discussing patient's meds.     Appointments: Patient saw PCP on 10/26/17 and is also followed by Dr. Max Fickle) and eye specialist.  Advance Directives: Son states he is unsure if patient has ever completed one. He will check and follow up. He has been advised to consider revising and/or completing a new one before patient's mental status deteriorates more.   Consent: [facility] services reviewed and discussed. Son gave verbal consent for services.     Plan: RN CM will send [facility] SW referral for possible community resources and assistance with transportation and in home support. RN CM will send [facility] community RN referral for further in home eval/assessment of care needs and assistance/education in managing chronic conditions.    Gabrielle Montgomery, RN,BSN,CCM [place] Management Telephonic Care Management Coordinator Direct Phone: [phone] Toll Free: [phone] Fax: [phone]

## 2017-10-29 ENCOUNTER — Other Ambulatory Visit: Payer: Self-pay | Admitting: *Deleted

## 2017-10-29 ENCOUNTER — Other Ambulatory Visit: Payer: Self-pay | Admitting: Licensed Clinical Social Worker

## 2017-10-29 NOTE — Patient Outreach (Signed)
[facility] [facility]) Care Management  10/29/2017  Gabrielle Hoffman June 30, 1931 [id number]  Gabrielle Hoffman is an 84 year old female patient with past medical history which includes hypertension, hyperlipidemia, mitral valve prolapse, implanted pacemaker, depression, vaginal prolapse, and late onset Alzheimer's disease.   Gabrielle Hoffman was referred to [place] Management for assistance with chronic disease management, medication adherence and management, and transportation needs. Gabrielle Hoffman is currently being cared for by Gabrielle Hoffman. During an interview with our telephonic case manager, he expressed concerns about being able to continue provide care at the level he has previously especially with Gabrielle Hoffman worsening memory. He confirmed that home health services are in place for assistance with bp checks. Our telephonic care manager has concerns about Gabrielle Hoffman understanding of Gabrielle Hoffman prescribed medication regimen. Gabrielle Hoffman was seen by Gabrielle primary care provider on 10/26/17.   I was unable to reach Gabrielle Hoffman or Gabrielle Hoffman by phone today.   Plan: I will follow up with Gabrielle Hoffman/Gabrielle Hoffman by phone no later than early next week.    [place] Management  [phone]

## 2017-10-29 NOTE — Patient Outreach (Signed)
[facility]) Care Management  10/29/2017  Gabrielle Hoffman [birth year] [id number]  Assessment- CSW received new referral on patient on 10/28/17 for transportation and in home support resources. CSW completed initial outreach call and patient answered and provided HIPPA verifications. CSW introduced self, reason for call and of [facility] social work services. Patient shares that her son is currently providing her stable transportation to medical appointments but she is not sure how much longer he can do this due to his other health conditions. CSW briefly provided education on a few transportation resources available in the community and patient is unsure if she would want to travel alone through [employer] or [employer]. Patient's son and caregiver [name] Jeneen Rinks) then picked up the phone and stated that he would not be able to provide any additional information today as he is not feeling good and suggested contacting family back next week at a later time to discuss resources. [name] shares that Thursdays are not good days to call as he takes patient to get her hair done and also picks up groceries. CSW expressed understanding.  Plan-CSW will complete outreach attempt next week and will assess social work needs at that time.  [name], BSW, MSW, [email or web address] Phone: [phone] Fax: [phone]

## 2017-11-02 ENCOUNTER — Other Ambulatory Visit: Payer: Self-pay | Admitting: Licensed Clinical Social Worker

## 2017-11-02 NOTE — Patient Outreach (Signed)
[facility]) Care Management  11/02/2017  Gabrielle Hoffman Jan 20, 1931 [id number]  Assessment- CSW completed outreach call to patient's residence and caregiver/son Gabrielle Hoffman answered. Gabrielle Hoffman states that he prefers to take all of patient's calls due to her late onset dementia. CSW introduced self, reason for call and of [facility] social work services. HIPPA verifications received from son. Family is in need of personal care resources and transportation resources per interview with [facility] telephonic case manager. Patient already has [employer] in place. CSW provided education on available transportation resources within [facility]: [place], [employer], [employer], Social research officer, government. Family feel that these resources are not appropriate for patient as patient would have to travel alone. Family would prefer for patient to travel to medical appointments with someone familiar and someone that she can trust due to dementia diagnosis. Son shares that he will start to looking into who could provide this type of transportation to his mother and whether it could be provided by a friend or family member. Son denied needing [facility] CSW to mail out transportation resources. CSW provided education on local personal care resources: [facility], [facility], In [employer], LTC placement, [employer], [employer]. Son denied interest in LTC placement. Son shares that he is familiar with the [facility] Programs but does not wish to pay for service. Son shares that he is interested in In [place] which is a [place] that contracts with community home care agencies to provide in home services. These services are free of charge. CSW provided education to son that the number of service hours will depend on the level of care needed. CSW also informed son that the current wait list for this is currently up to two years. Son expressed understanding and would like for [facility] CSW to make referral to [place].  Son is agreeable to [facility] CSW mailing out senior resources which will include Gabrielle Hoffman information and other Senior Centers in the case family wishes to engage in those services in the future. Family agreeable to this. Family deny any further social work needs at this time and were appreciative of social work assistance provided during phone call today. CSW will not open case at this time but will follow up in a few weeks to make sure mailed resources were successfully received.   CSW completed call to DSS and successfully placed patient on the wait list for [facility] [place].  Plan-CSW will contact family back within three weeks to ensure that senior resources were successfully received in the mail.  Gabrielle [place], BSW, MSW, [email or web address] Phone: [phone] Fax: [phone]

## 2017-11-02 NOTE — Patient Outreach (Signed)
[place] [facility]) Care Management  11/02/2017  Gabrielle Hoffman May 14, 1931 [id number]    Request received from [name], LCSW to mail patient information on community resources. Information mailed today.  Daneen Schick, BSW

## 2017-11-09 ENCOUNTER — Ambulatory Visit (INDEPENDENT_AMBULATORY_CARE_PROVIDER_SITE_OTHER): Payer: Medicare Other | Admitting: *Deleted

## 2017-11-09 DIAGNOSIS — I442 Atrioventricular block, complete: Secondary | ICD-10-CM

## 2017-11-10 ENCOUNTER — Encounter: Payer: Self-pay | Admitting: Internal Medicine

## 2017-11-10 NOTE — Progress Notes (Signed)
Remote pacemaker transmission.   

## 2017-11-11 ENCOUNTER — Other Ambulatory Visit: Payer: Self-pay

## 2017-11-11 NOTE — Patient Outreach (Signed)
[place] [facility]) Care Management  11/11/2017  Gabrielle Hoffman [birth year] [id number]   Care coordination: RNCM called to schedule home visit. Client answered the phone and confirmed two patient identifiers. She request RNCM schedule visit with her son, [name]. Message left requesting son call RNCM.  RNCM received return call from client's son.  Plan: home visit scheduled for next week.  Thea Silversmith, RN, MSN, [place] Coordinator Cell: [phone]

## 2017-11-12 ENCOUNTER — Encounter: Payer: Self-pay | Admitting: Cardiology

## 2017-11-13 ENCOUNTER — Encounter: Payer: Self-pay | Admitting: Internal Medicine

## 2017-11-13 ENCOUNTER — Encounter: Payer: Self-pay | Admitting: *Deleted

## 2017-11-13 ENCOUNTER — Ambulatory Visit (INDEPENDENT_AMBULATORY_CARE_PROVIDER_SITE_OTHER): Payer: Medicare Other | Admitting: Internal Medicine

## 2017-11-13 VITALS — BP 122/62 | HR 99 | Ht 66.0 in | Wt 108.0 lb

## 2017-11-13 DIAGNOSIS — Z950 Presence of cardiac pacemaker: Secondary | ICD-10-CM | POA: Diagnosis not present

## 2017-11-13 DIAGNOSIS — I442 Atrioventricular block, complete: Secondary | ICD-10-CM

## 2017-11-13 DIAGNOSIS — I5022 Chronic systolic (congestive) heart failure: Secondary | ICD-10-CM

## 2017-11-13 NOTE — Patient Instructions (Signed)

## 2017-11-13 NOTE — Progress Notes (Signed)
HPI Gabrielle Hoffman returns today for ongoing evaluation and management of her PPM. She is a pleasant 84 yo woman who has developed dementia and weight loss. Her son who is with her today notes that she has become increasingly forgetful and confused. She has no awareness of her difficulty.  Allergies  Allergen Reactions  . Sulfa Antibiotics Rash  . Sulfamethoxazole Rash     Current Outpatient Medications  Medication Sig Dispense Refill  . acetaminophen (TYLENOL) 325 MG tablet Take 650 mg by mouth every 6 (six) hours as needed for mild pain, moderate pain or fever.     Marland Kitchen amLODipine (NORVASC) 5 MG tablet Take 5 mg by mouth daily.    Marland Kitchen donepezil (ARICEPT) 10 MG tablet Take 10 mg by mouth daily.  6  . latanoprost (XALATAN) 0.005 % ophthalmic solution as directed.    . meclizine (ANTIVERT) 12.5 MG tablet Take 12.5 mg by mouth 2 (two) times daily as needed for dizziness.     . Multiple Vitamin (MULTIVITAMIN WITH MINERALS) TABS tablet Take 1 tablet by mouth daily.     No current facility-administered medications for this visit.      Past Medical History:  Diagnosis Date  . Asthma   . DDD (degenerative disc disease), lumbar   . Dementia    very mild   . Depression   . Disc disorder of cervical region   . Diverticulosis   . Hyperlipidemia   . Hypertension   . Hyponatremia 12/17/2011  . Kidney stones    "passed them"  . Memory loss   . Mitral valve prolapse   . Osteopenia   . Pneumonia    "I believe I had it a long time ago"  . Presence of permanent cardiac pacemaker   . Syncope   . UTI (lower urinary tract infection)   . Vaginal prolapse   . Venous insufficiency     ROS:   All systems reviewed and negative except as noted in the HPI.   Past Surgical History:  Procedure Laterality Date  . ABDOMINAL HYSTERECTOMY    . APPENDECTOMY    . EP IMPLANTABLE DEVICE N/A 08/27/2015   Procedure: Loop Recorder Insertion;  Surgeon: Evans Lance, MD;  Location: [place] [facility];  Service: Cardiovascular;  Laterality: N/A;  . EP IMPLANTABLE DEVICE N/A 11/19/2015   Procedure: Pacemaker Implant;  Surgeon: Evans Lance, MD;  Location: [place] [facility];  Service: Cardiovascular;  Laterality: N/A;  . INSERT / REPLACE / REMOVE PACEMAKER  11/19/2015  . TONSILLECTOMY       Family History  Problem Relation Age of Onset  . Cancer Sister      Social History   Socioeconomic History  . Marital status: Widowed    Spouse name: Not on file  . Number of children: Not on file  . Years of education: Not on file  . Highest education level: Not on file  Occupational History  . Not on file  Social Needs  . Financial resource strain: Not on file  . Food insecurity:    Worry: Not on file    Inability: Not on file  . Transportation needs:    Medical: Not on file    Non-medical: Not on file  Tobacco Use  . Smoking status: Never Smoker  . Smokeless tobacco: Never Used  Substance and Sexual Activity  . Alcohol use: No  . Drug use: No  . Sexual activity: Not Currently  Birth control/protection: Post-menopausal  Lifestyle  . Physical activity:    Days per week: Not on file    Minutes per session: Not on file  . Stress: Not on file  Relationships  . Social connections:    Talks on phone: Not on file    Gets together: Not on file    Attends religious service: Not on file    Active member of club or organization: Not on file    Attends meetings of clubs or organizations: Not on file    Relationship status: Not on file  . Intimate partner violence:    Fear of current or ex partner: Not on file    Emotionally abused: Not on file    Physically abused: Not on file    Forced sexual activity: Not on file  Other Topics Concern  . Not on file  Social History Narrative  . Not on file     BP 122/62   Pulse 99   Ht 5\' 6"  (1.676 m)   Wt 108 lb (49 kg)   BMI 17.43 kg/m   Physical Exam:  elderly appearing 84 yo woman, NAD HEENT: Unremarkable Neck:  6 cm  JVD, no thyromegally Lymphatics:  No adenopathy Back:  No CVA tenderness Lungs:  Clear with no wheezes HEART:  Regular rate rhythm, no murmurs, no rubs, no clicks Abd:  soft, positive bowel sounds, no organomegally, no rebound, no guarding Ext:  2 plus pulses, no edema, no cyanosis, no clubbing Skin:  No rashes no nodules Neuro:  CN II through XII intact, motor grossly intact  EKG - nsr with LBBB  DEVICE  Normal device function.  See PaceArt for details.   Assess/Plan: 1. Stokes Adams syncope - she has not had any additional episodes since her PPM was inserted.  2. PPM - her St. Jude DDD PM is working normally. Will recheck in several months 3. Dementia - she is on medical therapy. She has worsened but she is not combative or aggressive. 4. Weight loss - a consequence of her dementia. We will follow.  [name]

## 2017-11-16 ENCOUNTER — Other Ambulatory Visit: Payer: Self-pay | Admitting: Licensed Clinical Social Worker

## 2017-11-16 ENCOUNTER — Other Ambulatory Visit: Payer: Self-pay

## 2017-11-16 ENCOUNTER — Ambulatory Visit: Payer: Self-pay

## 2017-11-16 LAB — CUP PACEART INCLINIC DEVICE CHECK
Implantable Lead Location: 753860
Implantable Pulse Generator Implant Date: 20170417
Lead Channel Impedance Value: 425 Ohm
Lead Channel Pacing Threshold Amplitude: 0.5 V
Lead Channel Pacing Threshold Amplitude: 0.75 V
Lead Channel Pacing Threshold Amplitude: 0.75 V
Lead Channel Pacing Threshold Pulse Width: 0.5 ms
Lead Channel Sensing Intrinsic Amplitude: 0.6 mV
Lead Channel Sensing Intrinsic Amplitude: 12 mV
Lead Channel Setting Pacing Amplitude: 1.5 V
Lead Channel Setting Pacing Pulse Width: 0.5 ms
MDC IDC LEAD IMPLANT DT: 20170417
MDC IDC LEAD IMPLANT DT: 20170417
MDC IDC LEAD LOCATION: 753859
MDC IDC MSMT BATTERY REMAINING LONGEVITY: 139 mo
MDC IDC MSMT BATTERY VOLTAGE: 3.01 V
MDC IDC MSMT LEADCHNL RA PACING THRESHOLD AMPLITUDE: 0.5 V
MDC IDC MSMT LEADCHNL RA PACING THRESHOLD PULSEWIDTH: 0.5 ms
MDC IDC MSMT LEADCHNL RA PACING THRESHOLD PULSEWIDTH: 0.5 ms
MDC IDC MSMT LEADCHNL RV IMPEDANCE VALUE: 462.5 Ohm
MDC IDC MSMT LEADCHNL RV PACING THRESHOLD PULSEWIDTH: 0.5 ms
MDC IDC PG SERIAL: 7883162
MDC IDC SESS DTM: 20190412204919
MDC IDC SET LEADCHNL RV PACING AMPLITUDE: 0.875
MDC IDC SET LEADCHNL RV SENSING SENSITIVITY: 2 mV
MDC IDC STAT BRADY RA PERCENT PACED: 0.13 %
MDC IDC STAT BRADY RV PERCENT PACED: 0.07 %

## 2017-11-16 NOTE — Patient Outreach (Signed)
[place] [facility]) Care Management  11/16/2017  JOANNY DUPREE 16-Sep-1930 [id number]  Assessment- [facility] CSW completed call to patient's residence and patient answered. She reports that her son is currently not available to talk right now and asked if [facility] CSW would like to leave a message with her. Contact information provided to patient and a request for a return call once available to [facility] CSW.  Plan-[facility] CSW will await for return call from patient's caregiver. [facility] CSW will complete additional outreach attempt within two weeks if no return call has been placed.  [name], BSW, MSW, [email or web address] Phone: [phone] Fax: [phone]

## 2017-11-16 NOTE — Patient Outreach (Signed)
[place] [facility]) Care Management  11/16/2017  MIKO MARKWOOD May 18, 1931 [id number]   RNCM received voice message from client's son Makari Sanko requesting to cancel appointment. "I'm sorry, I just don't feel well today, my mother is getting over he cold too". Mr. Pucillo states he will be in touch with RNCM to reschedule.  RNCM called to acknowledge receipt of voice message and to reschedule home visit. He was not available at the time.   RNCM received return call from Mr. Knoche  Plan: Home visit rescheduled for next week.  Thea Silversmith, RN, MSN, [place] Coordinator Cell: [phone]

## 2017-11-23 ENCOUNTER — Other Ambulatory Visit: Payer: Self-pay | Admitting: Licensed Clinical Social Worker

## 2017-11-23 NOTE — Patient Outreach (Signed)
[place] [facility]) Care Management  11/23/2017  Gabrielle Hoffman Jan 06, 1931 [id number]  Assessment-CSW completed second outreach attempt today to follow up on mailed community resources. CSW unable to reach patient successfully. CSW left a HIPPA compliant voice message encouraging patient or family to return call once available to confirm that they received community resources in the mail. [facility] CSW will mail unsuccessful outreach letter at this time.   Plan-CSW will await return call or complete third and final outreach attempt within two weeks.  Gabrielle [place], BSW, MSW, [email or web address] Phone: [phone] Fax: [phone]

## 2017-11-23 NOTE — Patient Outreach (Signed)
[facility]) Care Management  11/23/2017  Gabrielle Hoffman [birth year] [id number]  [facility] CSW received incoming return call from patient's caregiver/son [name]. [name] provided HIPPA verifications and shared that he has been sick and has been unable to contact [facility] CSW back until now. [name] confirms that he successfully received community resources in the mail that [facility] CSW mailed to family. [facility] CSW completed review of each resource again over the phone today. Son was reminded that patient is on the wait list for [facility] [place] and that program may contact them every so often by mail or phone call to confirm if family still wishes to be on the wait list and DSS will need a response or patient will be taken off the wait list. Son expressed understanding and was appreciative of this reminder again. Family deny any further social work needs at this time and is agreeable to social work discharge. [facility] CSW will sign off at this time now that community resources have been successfully received and reviewed again.  [name], BSW, MSW, [email or web address] Phone: [phone] Fax: [phone]

## 2017-11-24 ENCOUNTER — Other Ambulatory Visit: Payer: Self-pay

## 2017-11-24 NOTE — Patient Outreach (Signed)
[facility]) Care Management   11/24/2017  Gabrielle Hoffman 12/14/1930 [id number]  Gabrielle Hoffman is an 84 y.o. female  Subjective: client states, "I think I am doing alright. I am able to walk and feed myself and take my own bath."  Objective:  BP 138/80   Pulse 81   Resp 18   Ht 1.676 m (5\' 6" )   Wt 108 lb (49 kg)   SpO2 96%   BMI 17.43 kg/m   Review of Systems  Respiratory:       End inspiratory crackle noted lower right lobe, other wise breath sounds clear.  Cardiovascular:       S1S2 noted, regular    Physical Exam skin color within normal limits.  Encounter Medications:   Outpatient Encounter Medications as of 11/24/2017  Medication Sig Note  . acetaminophen (TYLENOL) 325 MG tablet Take 650 mg by mouth every 6 (six) hours as needed for mild pain, moderate pain or fever.    Marland Kitchen amLODipine (NORVASC) 5 MG tablet Take 5 mg by mouth daily.   Marland Kitchen donepezil (ARICEPT) 10 MG tablet Take 10 mg by mouth daily. 11/24/2017: Per son decreased to 5mg  daily per primary care provider.  . latanoprost (XALATAN) 0.005 % ophthalmic solution as directed.   . meclizine (ANTIVERT) 12.5 MG tablet Take 12.5 mg by mouth 2 (two) times daily as needed for dizziness.    . Multiple Vitamin (MULTIVITAMIN WITH MINERALS) TABS tablet Take 1 tablet by mouth daily.    No facility-administered encounter medications on file as of 11/24/2017.     Functional Status:   In your present state of health, do you have any difficulty performing the following activities: 11/24/2017  Hearing? N  Vision? N  Difficulty concentrating or making decisions? Y  Walking or climbing stairs? Y  Dressing or bathing? N  Doing errands, shopping? Y  Preparing Food and eating ? N  Using the Toilet? N  In the past six months, have you accidently leaked urine? N  Do you have problems with loss of bowel control? N  Managing your Medications? N  Comment son helps  Managing your Finances? N  Comment son helps   Housekeeping or managing your Housekeeping? Y  Some recent data might be hidden    Fall/Depression Screening:    Fall Risk  11/24/2017 10/28/2017  Falls in the past year? No No  Risk for fall due to : - History of fall(s);Impaired vision;Medication side effect;Mental status change   PHQ 2/9 Scores 11/24/2017 11/02/2017 10/28/2017  PHQ - 2 Score 0 - -  Exception Documentation - Other- indicate reason in comment box Other- indicate reason in comment box  Not completed - call completed with son not patient call completed with son    Assessment:  25 year old with history of HTN, pacemaker, dementia, UTI.   RNCM completed home visit. Present during visit, client's son, Suan Pyeatt. Client is alert, oriented to self, place, situation.  RNCM discussed the care management program. Both client and son, [name], viewed consent form. When Gabrielle Hoffman asked [name] his preference for signing consent, [name] instructed client to sign consent form.    Medications reviewed. Son reports he is helping client with medication management. He is knowledgeable of the medications and how they are taken. He denies any questions or issues regarding client's medications.   [name] reports client's appetite has decreased some, but adds she is eating more junk food than other foods. He reports he has discussed  with her providers and was encouraged to allow client to eat what she wants to eat. Client is also receiving meals on wheels and she states she eats the meals she likes.  Client is alert, oriented to self, place, but often looked to [name] to answer questions. However, she is very clear that she is not going leave her home and does not want to go to an assisted living.   Son expresses his concern that he is not doing what he can to take care of his mother because of his own health issues. Client responded to [place], they have to lean on each other and emphatically expressed that she was not leaving her house.  RNCM discussed the  benefits of socialization for client and encouraged her to think about going to adult day place or senior citizens center. Also discussed the [employer] center and participating in church activities. RNCM also reviewed resources sent by social worker and encouraged both [name] and client to look into these resources.   [name] reports he takes client out to get her hair done every Thursday and that client often has church members come to the house to see her. He reports he also takes her shopping and she gets socialization this way.  RNCM discussed following up with her church members to see if someone in the church is aware of personal care service resource.   RNCM encouraged [name] to look the budget to see if there are some funds available to pay for personal care service a few hours/week.   No nursing needs noted. Primary topic of discussion was son's thinking that client will be better in an assisted living and client's definitively stating she does not want to go to an assisted living and that she plans to stay in her home. [name] stating that his is going to support his mother as needed.  Plan: RNCM will discuss case with social worker regarding any additional resources and follow up with client. RNCM will close out of case once client/family is updated if no additional needs.    Thea Silversmith, RN, MSN, [place] Coordinator Cell: [phone]

## 2017-11-25 ENCOUNTER — Other Ambulatory Visit: Payer: Self-pay

## 2017-11-25 NOTE — Patient Outreach (Signed)
[facility]) Care Management  11/25/2017  Gabrielle Hoffman 1930/10/01 [id number]   Care Coordination: RNCM called to update [name] and her son. [name] answered the phone stating that [name] was not home and she preferred he be in on the conversation. RNCM's contact number given again.  Plan: await return call and follow up by the end of the week. If no return call.  Thea Silversmith, RN, MSN, [place] Coordinator Cell: [phone]

## 2017-11-26 ENCOUNTER — Other Ambulatory Visit: Payer: Self-pay

## 2017-11-26 NOTE — Patient Outreach (Signed)
[facility]) Care Management  11/26/2017  Gabrielle Hoffman 04-15-1931 [id number]   Received return call from client's Hoffman, Gabrielle Hoffman. RNCM relayed information- spoke with social worker, [name], and all known available resources for personal care services has been sent which Gabrielle Hoffman reinforced that he has as client continues to decline assisted living placement.  Gabrielle Hoffman expressed that his health issues has him questioning his ability to meet all of client's needs. RNCM asked if he would like some resources for counseling and Gabrielle Hoffman declined the resources adding "maybe down the road".    RNCM reinforced possibility of getting client out of the house more at an adult day care programs or at the Senior resources center. Gabrielle Hoffman responds that he does not think that client would like to go somewhere and be around people. "She is not going to want to be around people she doesn't know. She gets tired easy and then she wants to get home". He adds someone from her church come to see her on a routine bases.  RNCM encouraged Gabrielle Hoffman to look into seeing if he could budget for personal care service a few hours each week.  RNCM also reinforced [facility] solution and discussed in detail caregiver support group; and care giver education. RNCM provided contact number to Burman Foster, director of caregiver support group and encouraged him to call her.  RNCM inquired about education regarding dementia, advancing stages. He declines stating he pretty much knows this information. He states he has a close relative that just went through this with his mother and was already aware of some of the resources that was provided to him.   He thanked [facility] for providing the resources and state that he is on the way to take client to get her hair done.  RNCM reinforced the 24 hours nurse advice line.  RNCM encouraged Gabrielle Hoffman to call if clients condition changes or needs change.  Plan: close case.  Thea Silversmith, RN, MSN,  [place] Coordinator Cell: [phone]

## 2017-12-02 LAB — CUP PACEART REMOTE DEVICE CHECK
Battery Remaining Longevity: 133 mo
Battery Remaining Percentage: 95.5 %
Battery Voltage: 3.01 V
Brady Statistic AP VP Percent: 1 %
Brady Statistic AS VS Percent: 99 %
Brady Statistic RA Percent Paced: 1 %
Implantable Lead Implant Date: 20170417
Implantable Lead Location: 753860
Implantable Pulse Generator Implant Date: 20170417
Lead Channel Impedance Value: 430 Ohm
Lead Channel Impedance Value: 450 Ohm
Lead Channel Pacing Threshold Amplitude: 0.375 V
Lead Channel Pacing Threshold Pulse Width: 0.5 ms
Lead Channel Pacing Threshold Pulse Width: 0.5 ms
Lead Channel Sensing Intrinsic Amplitude: 0.9 mV
Lead Channel Setting Pacing Amplitude: 0.875
MDC IDC LEAD IMPLANT DT: 20170417
MDC IDC LEAD LOCATION: 753859
MDC IDC MSMT LEADCHNL RV PACING THRESHOLD AMPLITUDE: 0.625 V
MDC IDC MSMT LEADCHNL RV SENSING INTR AMPL: 12 mV
MDC IDC PG SERIAL: 7883162
MDC IDC SESS DTM: 20190408060004
MDC IDC SET LEADCHNL RA PACING AMPLITUDE: 1.375
MDC IDC SET LEADCHNL RV PACING PULSEWIDTH: 0.5 ms
MDC IDC SET LEADCHNL RV SENSING SENSITIVITY: 2 mV
MDC IDC STAT BRADY AP VS PERCENT: 1 %
MDC IDC STAT BRADY AS VP PERCENT: 1 %
MDC IDC STAT BRADY RV PERCENT PACED: 1 %

## 2017-12-30 DIAGNOSIS — Z1231 Encounter for screening mammogram for malignant neoplasm of breast: Secondary | ICD-10-CM | POA: Diagnosis not present

## 2017-12-30 DIAGNOSIS — N390 Urinary tract infection, site not specified: Secondary | ICD-10-CM | POA: Diagnosis not present

## 2017-12-30 DIAGNOSIS — Z01419 Encounter for gynecological examination (general) (routine) without abnormal findings: Secondary | ICD-10-CM | POA: Diagnosis not present

## 2017-12-30 DIAGNOSIS — Z681 Body mass index (BMI) 19 or less, adult: Secondary | ICD-10-CM | POA: Diagnosis not present

## 2018-02-08 ENCOUNTER — Ambulatory Visit (INDEPENDENT_AMBULATORY_CARE_PROVIDER_SITE_OTHER): Payer: Medicare Other | Admitting: *Deleted

## 2018-02-08 DIAGNOSIS — I442 Atrioventricular block, complete: Secondary | ICD-10-CM | POA: Diagnosis not present

## 2018-02-09 NOTE — Progress Notes (Signed)
Remote pacemaker transmission.   

## 2018-02-22 DIAGNOSIS — F0280 Dementia in other diseases classified elsewhere without behavioral disturbance: Secondary | ICD-10-CM | POA: Diagnosis not present

## 2018-02-22 DIAGNOSIS — G309 Alzheimer's disease, unspecified: Secondary | ICD-10-CM | POA: Diagnosis not present

## 2018-02-22 DIAGNOSIS — N390 Urinary tract infection, site not specified: Secondary | ICD-10-CM | POA: Diagnosis not present

## 2018-02-22 DIAGNOSIS — H906 Mixed conductive and sensorineural hearing loss, bilateral: Secondary | ICD-10-CM | POA: Diagnosis not present

## 2018-02-22 DIAGNOSIS — I10 Essential (primary) hypertension: Secondary | ICD-10-CM | POA: Diagnosis not present

## 2018-02-23 LAB — CUP PACEART REMOTE DEVICE CHECK
Battery Remaining Longevity: 133 mo
Brady Statistic AP VS Percent: 1 %
Brady Statistic AS VP Percent: 1 %
Date Time Interrogation Session: 20190708060031
Implantable Lead Implant Date: 20170417
Implantable Lead Location: 753860
Implantable Pulse Generator Implant Date: 20170417
Lead Channel Impedance Value: 430 Ohm
Lead Channel Pacing Threshold Amplitude: 0.625 V
Lead Channel Pacing Threshold Pulse Width: 0.5 ms
Lead Channel Pacing Threshold Pulse Width: 0.5 ms
Lead Channel Sensing Intrinsic Amplitude: 12 mV
Lead Channel Setting Pacing Amplitude: 0.875
Lead Channel Setting Pacing Amplitude: 1.375
Lead Channel Setting Pacing Pulse Width: 0.5 ms
MDC IDC LEAD IMPLANT DT: 20170417
MDC IDC LEAD LOCATION: 753859
MDC IDC MSMT BATTERY REMAINING PERCENTAGE: 95.5 %
MDC IDC MSMT BATTERY VOLTAGE: 3.01 V
MDC IDC MSMT LEADCHNL RA PACING THRESHOLD AMPLITUDE: 0.375 V
MDC IDC MSMT LEADCHNL RA SENSING INTR AMPL: 0.7 mV
MDC IDC MSMT LEADCHNL RV IMPEDANCE VALUE: 440 Ohm
MDC IDC PG SERIAL: 7883162
MDC IDC SET LEADCHNL RV SENSING SENSITIVITY: 2 mV
MDC IDC STAT BRADY AP VP PERCENT: 1 %
MDC IDC STAT BRADY AS VS PERCENT: 99 %
MDC IDC STAT BRADY RA PERCENT PACED: 1 %
MDC IDC STAT BRADY RV PERCENT PACED: 1 %

## 2018-03-12 DIAGNOSIS — H5203 Hypermetropia, bilateral: Secondary | ICD-10-CM | POA: Diagnosis not present

## 2018-03-12 DIAGNOSIS — H40012 Open angle with borderline findings, low risk, left eye: Secondary | ICD-10-CM | POA: Diagnosis not present

## 2018-03-12 DIAGNOSIS — H401111 Primary open-angle glaucoma, right eye, mild stage: Secondary | ICD-10-CM | POA: Diagnosis not present

## 2018-03-12 DIAGNOSIS — H25813 Combined forms of age-related cataract, bilateral: Secondary | ICD-10-CM | POA: Diagnosis not present

## 2018-03-12 DIAGNOSIS — H401121 Primary open-angle glaucoma, left eye, mild stage: Secondary | ICD-10-CM | POA: Diagnosis not present

## 2018-03-12 DIAGNOSIS — H52223 Regular astigmatism, bilateral: Secondary | ICD-10-CM | POA: Diagnosis not present

## 2018-03-16 DIAGNOSIS — H903 Sensorineural hearing loss, bilateral: Secondary | ICD-10-CM | POA: Diagnosis not present

## 2018-05-11 ENCOUNTER — Ambulatory Visit (INDEPENDENT_AMBULATORY_CARE_PROVIDER_SITE_OTHER): Payer: Medicare Other | Admitting: *Deleted

## 2018-05-11 DIAGNOSIS — I442 Atrioventricular block, complete: Secondary | ICD-10-CM

## 2018-05-12 NOTE — Progress Notes (Signed)
Remote pacemaker transmission.   

## 2018-05-20 ENCOUNTER — Encounter: Payer: Self-pay | Admitting: Cardiology

## 2018-06-25 LAB — CUP PACEART REMOTE DEVICE CHECK
Battery Remaining Longevity: 133 mo
Battery Voltage: 3.01 V
Brady Statistic AP VP Percent: 1 %
Brady Statistic RA Percent Paced: 1 %
Brady Statistic RV Percent Paced: 1 %
Implantable Lead Implant Date: 20170417
Implantable Lead Location: 753859
Implantable Lead Location: 753860
Implantable Pulse Generator Implant Date: 20170417
Lead Channel Impedance Value: 430 Ohm
Lead Channel Pacing Threshold Amplitude: 0.5 V
Lead Channel Pacing Threshold Pulse Width: 0.5 ms
Lead Channel Sensing Intrinsic Amplitude: 12 mV
Lead Channel Setting Pacing Amplitude: 0.875
Lead Channel Setting Pacing Amplitude: 1.5 V
Lead Channel Setting Sensing Sensitivity: 2 mV
MDC IDC LEAD IMPLANT DT: 20170417
MDC IDC MSMT BATTERY REMAINING PERCENTAGE: 95.5 %
MDC IDC MSMT LEADCHNL RA IMPEDANCE VALUE: 410 Ohm
MDC IDC MSMT LEADCHNL RA SENSING INTR AMPL: 1.3 mV
MDC IDC MSMT LEADCHNL RV PACING THRESHOLD AMPLITUDE: 0.625 V
MDC IDC MSMT LEADCHNL RV PACING THRESHOLD PULSEWIDTH: 0.5 ms
MDC IDC PG SERIAL: 7883162
MDC IDC SESS DTM: 20191007060015
MDC IDC SET LEADCHNL RV PACING PULSEWIDTH: 0.5 ms
MDC IDC STAT BRADY AP VS PERCENT: 1 %
MDC IDC STAT BRADY AS VP PERCENT: 1 %
MDC IDC STAT BRADY AS VS PERCENT: 99 %

## 2018-06-28 DIAGNOSIS — R413 Other amnesia: Secondary | ICD-10-CM | POA: Diagnosis not present

## 2018-06-28 DIAGNOSIS — F0280 Dementia in other diseases classified elsewhere without behavioral disturbance: Secondary | ICD-10-CM | POA: Diagnosis not present

## 2018-06-28 DIAGNOSIS — Z1211 Encounter for screening for malignant neoplasm of colon: Secondary | ICD-10-CM | POA: Diagnosis not present

## 2018-06-28 DIAGNOSIS — F325 Major depressive disorder, single episode, in full remission: Secondary | ICD-10-CM | POA: Diagnosis not present

## 2018-06-28 DIAGNOSIS — R350 Frequency of micturition: Secondary | ICD-10-CM | POA: Diagnosis not present

## 2018-06-28 DIAGNOSIS — I459 Conduction disorder, unspecified: Secondary | ICD-10-CM | POA: Diagnosis not present

## 2018-06-29 DIAGNOSIS — Z1211 Encounter for screening for malignant neoplasm of colon: Secondary | ICD-10-CM | POA: Diagnosis not present

## 2018-07-30 DIAGNOSIS — N390 Urinary tract infection, site not specified: Secondary | ICD-10-CM | POA: Diagnosis not present

## 2018-08-10 ENCOUNTER — Ambulatory Visit (INDEPENDENT_AMBULATORY_CARE_PROVIDER_SITE_OTHER): Payer: Medicare Other

## 2018-08-10 DIAGNOSIS — I442 Atrioventricular block, complete: Secondary | ICD-10-CM

## 2018-08-11 NOTE — Progress Notes (Signed)
Remote pacemaker transmission.   

## 2018-08-12 LAB — CUP PACEART REMOTE DEVICE CHECK
Battery Remaining Longevity: 133 mo
Battery Remaining Percentage: 95.5 %
Battery Voltage: 3.01 V
Brady Statistic AS VS Percent: 99 %
Date Time Interrogation Session: 20200106204153
Implantable Lead Implant Date: 20170417
Implantable Lead Implant Date: 20170417
Implantable Lead Location: 753859
Implantable Pulse Generator Implant Date: 20170417
Lead Channel Pacing Threshold Amplitude: 0.5 V
Lead Channel Pacing Threshold Pulse Width: 0.5 ms
Lead Channel Sensing Intrinsic Amplitude: 1.1 mV
Lead Channel Setting Pacing Pulse Width: 0.5 ms
Lead Channel Setting Sensing Sensitivity: 2 mV
MDC IDC LEAD LOCATION: 753860
MDC IDC MSMT LEADCHNL RA IMPEDANCE VALUE: 400 Ohm
MDC IDC MSMT LEADCHNL RA PACING THRESHOLD PULSEWIDTH: 0.5 ms
MDC IDC MSMT LEADCHNL RV IMPEDANCE VALUE: 410 Ohm
MDC IDC MSMT LEADCHNL RV PACING THRESHOLD AMPLITUDE: 0.625 V
MDC IDC MSMT LEADCHNL RV SENSING INTR AMPL: 12 mV
MDC IDC SET LEADCHNL RA PACING AMPLITUDE: 1.5 V
MDC IDC SET LEADCHNL RV PACING AMPLITUDE: 0.875
MDC IDC STAT BRADY AP VP PERCENT: 1 %
MDC IDC STAT BRADY AP VS PERCENT: 1 %
MDC IDC STAT BRADY AS VP PERCENT: 1 %
MDC IDC STAT BRADY RA PERCENT PACED: 1 %
MDC IDC STAT BRADY RV PERCENT PACED: 1 %
Pulse Gen Model: 2272
Pulse Gen Serial Number: 7883162

## 2018-09-20 DIAGNOSIS — F0280 Dementia in other diseases classified elsewhere without behavioral disturbance: Secondary | ICD-10-CM | POA: Diagnosis not present

## 2018-09-20 DIAGNOSIS — F325 Major depressive disorder, single episode, in full remission: Secondary | ICD-10-CM | POA: Diagnosis not present

## 2018-09-20 DIAGNOSIS — I10 Essential (primary) hypertension: Secondary | ICD-10-CM | POA: Diagnosis not present

## 2018-09-24 DIAGNOSIS — H401121 Primary open-angle glaucoma, left eye, mild stage: Secondary | ICD-10-CM | POA: Diagnosis not present

## 2018-09-24 DIAGNOSIS — H401111 Primary open-angle glaucoma, right eye, mild stage: Secondary | ICD-10-CM | POA: Diagnosis not present

## 2018-10-13 DIAGNOSIS — N390 Urinary tract infection, site not specified: Secondary | ICD-10-CM | POA: Diagnosis not present

## 2018-11-03 DIAGNOSIS — R110 Nausea: Secondary | ICD-10-CM | POA: Diagnosis not present

## 2018-11-03 DIAGNOSIS — F325 Major depressive disorder, single episode, in full remission: Secondary | ICD-10-CM | POA: Diagnosis not present

## 2018-11-03 DIAGNOSIS — I10 Essential (primary) hypertension: Secondary | ICD-10-CM | POA: Diagnosis not present

## 2018-11-03 DIAGNOSIS — M25561 Pain in right knee: Secondary | ICD-10-CM | POA: Diagnosis not present

## 2018-11-09 ENCOUNTER — Other Ambulatory Visit: Payer: Self-pay

## 2018-11-09 ENCOUNTER — Ambulatory Visit (INDEPENDENT_AMBULATORY_CARE_PROVIDER_SITE_OTHER): Payer: Medicare Other | Admitting: *Deleted

## 2018-11-09 DIAGNOSIS — I5022 Chronic systolic (congestive) heart failure: Secondary | ICD-10-CM

## 2018-11-09 DIAGNOSIS — I442 Atrioventricular block, complete: Secondary | ICD-10-CM

## 2018-11-09 LAB — CUP PACEART REMOTE DEVICE CHECK
Battery Remaining Longevity: 133 mo
Battery Remaining Percentage: 95.5 %
Battery Voltage: 3.01 V
Brady Statistic AP VP Percent: 1 %
Brady Statistic AP VS Percent: 1 %
Brady Statistic AS VP Percent: 1 %
Brady Statistic AS VS Percent: 99 %
Brady Statistic RA Percent Paced: 1 %
Brady Statistic RV Percent Paced: 1 %
Date Time Interrogation Session: 20200406060014
Implantable Lead Implant Date: 20170417
Implantable Lead Implant Date: 20170417
Implantable Lead Location: 753859
Implantable Lead Location: 753860
Implantable Pulse Generator Implant Date: 20170417
Lead Channel Impedance Value: 390 Ohm
Lead Channel Impedance Value: 430 Ohm
Lead Channel Pacing Threshold Amplitude: 0.375 V
Lead Channel Pacing Threshold Amplitude: 0.625 V
Lead Channel Pacing Threshold Pulse Width: 0.5 ms
Lead Channel Pacing Threshold Pulse Width: 0.5 ms
Lead Channel Sensing Intrinsic Amplitude: 0.6 mV
Lead Channel Sensing Intrinsic Amplitude: 11.9 mV
Lead Channel Setting Pacing Amplitude: 0.875
Lead Channel Setting Pacing Amplitude: 1.375
Lead Channel Setting Pacing Pulse Width: 0.5 ms
Lead Channel Setting Sensing Sensitivity: 2 mV
Pulse Gen Model: 2272
Pulse Gen Serial Number: 7883162

## 2018-11-10 DIAGNOSIS — H401111 Primary open-angle glaucoma, right eye, mild stage: Secondary | ICD-10-CM | POA: Diagnosis not present

## 2018-11-12 ENCOUNTER — Telehealth: Payer: Self-pay

## 2018-11-12 NOTE — Telephone Encounter (Signed)
Call placed to Pt. Advised we would move her office visit to August d/t Covid.  Pt indicates understanding.  Will continue to follow remotely.

## 2018-11-16 ENCOUNTER — Encounter: Payer: Medicare Other | Admitting: Internal Medicine

## 2018-11-18 NOTE — Progress Notes (Signed)
Remote pacemaker transmission.   

## 2018-12-29 DIAGNOSIS — L03011 Cellulitis of right finger: Secondary | ICD-10-CM | POA: Diagnosis not present

## 2018-12-29 DIAGNOSIS — L0889 Other specified local infections of the skin and subcutaneous tissue: Secondary | ICD-10-CM | POA: Diagnosis not present

## 2019-01-18 DIAGNOSIS — Z124 Encounter for screening for malignant neoplasm of cervix: Secondary | ICD-10-CM | POA: Diagnosis not present

## 2019-01-18 DIAGNOSIS — R350 Frequency of micturition: Secondary | ICD-10-CM | POA: Diagnosis not present

## 2019-01-18 DIAGNOSIS — M816 Localized osteoporosis [Lequesne]: Secondary | ICD-10-CM | POA: Diagnosis not present

## 2019-01-18 DIAGNOSIS — Z681 Body mass index (BMI) 19 or less, adult: Secondary | ICD-10-CM | POA: Diagnosis not present

## 2019-01-18 DIAGNOSIS — Z1231 Encounter for screening mammogram for malignant neoplasm of breast: Secondary | ICD-10-CM | POA: Diagnosis not present

## 2019-01-18 DIAGNOSIS — N958 Other specified menopausal and perimenopausal disorders: Secondary | ICD-10-CM | POA: Diagnosis not present

## 2019-02-08 ENCOUNTER — Ambulatory Visit (INDEPENDENT_AMBULATORY_CARE_PROVIDER_SITE_OTHER): Payer: Medicare Other | Admitting: *Deleted

## 2019-02-08 DIAGNOSIS — I442 Atrioventricular block, complete: Secondary | ICD-10-CM | POA: Diagnosis not present

## 2019-02-08 LAB — CUP PACEART REMOTE DEVICE CHECK
Date Time Interrogation Session: 20200707113621
Implantable Lead Implant Date: 20170417
Implantable Lead Implant Date: 20170417
Implantable Lead Location: 753859
Implantable Lead Location: 753860
Implantable Pulse Generator Implant Date: 20170417
Pulse Gen Model: 2272
Pulse Gen Serial Number: 7883162

## 2019-02-20 ENCOUNTER — Encounter: Payer: Self-pay | Admitting: Cardiology

## 2019-02-20 NOTE — Progress Notes (Signed)
Remote pacemaker transmission.   

## 2019-03-01 DIAGNOSIS — N390 Urinary tract infection, site not specified: Secondary | ICD-10-CM | POA: Diagnosis not present

## 2019-03-16 DIAGNOSIS — H1859 Other hereditary corneal dystrophies: Secondary | ICD-10-CM | POA: Diagnosis not present

## 2019-03-16 DIAGNOSIS — H25813 Combined forms of age-related cataract, bilateral: Secondary | ICD-10-CM | POA: Diagnosis not present

## 2019-03-16 DIAGNOSIS — H52223 Regular astigmatism, bilateral: Secondary | ICD-10-CM | POA: Diagnosis not present

## 2019-03-16 DIAGNOSIS — H401121 Primary open-angle glaucoma, left eye, mild stage: Secondary | ICD-10-CM | POA: Diagnosis not present

## 2019-03-16 DIAGNOSIS — H5203 Hypermetropia, bilateral: Secondary | ICD-10-CM | POA: Diagnosis not present

## 2019-03-16 DIAGNOSIS — H524 Presbyopia: Secondary | ICD-10-CM | POA: Diagnosis not present

## 2019-03-16 DIAGNOSIS — H1850 Unspecified hereditary corneal dystrophies: Secondary | ICD-10-CM | POA: Diagnosis not present

## 2019-03-25 ENCOUNTER — Telehealth: Payer: Self-pay

## 2019-03-25 NOTE — Telephone Encounter (Signed)
Spoke with pts son regarding appt on 03/28/19. Pt stated he and pt has not been in contact with anyone who may have covid-19 and has no symptoms.

## 2019-03-28 ENCOUNTER — Other Ambulatory Visit: Payer: Self-pay

## 2019-03-28 ENCOUNTER — Encounter: Payer: Medicare Other | Admitting: Internal Medicine

## 2019-03-28 ENCOUNTER — Ambulatory Visit (INDEPENDENT_AMBULATORY_CARE_PROVIDER_SITE_OTHER): Payer: Medicare Other | Admitting: Internal Medicine

## 2019-03-28 ENCOUNTER — Encounter: Payer: Self-pay | Admitting: Internal Medicine

## 2019-03-28 VITALS — BP 130/62 | HR 84 | Ht 66.0 in | Wt 126.4 lb

## 2019-03-28 DIAGNOSIS — I442 Atrioventricular block, complete: Secondary | ICD-10-CM | POA: Diagnosis not present

## 2019-03-28 DIAGNOSIS — Z950 Presence of cardiac pacemaker: Secondary | ICD-10-CM

## 2019-03-28 DIAGNOSIS — I5022 Chronic systolic (congestive) heart failure: Secondary | ICD-10-CM | POA: Diagnosis not present

## 2019-03-28 LAB — CUP PACEART INCLINIC DEVICE CHECK
Battery Remaining Longevity: 140 mo
Battery Voltage: 3.01 V
Brady Statistic RA Percent Paced: 0.2 %
Brady Statistic RV Percent Paced: 0.09 %
Date Time Interrogation Session: 20200824174654
Implantable Lead Implant Date: 20170417
Implantable Lead Implant Date: 20170417
Implantable Lead Location: 753859
Implantable Lead Location: 753860
Implantable Pulse Generator Implant Date: 20170417
Lead Channel Impedance Value: 412.5 Ohm
Lead Channel Impedance Value: 462.5 Ohm
Lead Channel Pacing Threshold Amplitude: 0.375 V
Lead Channel Pacing Threshold Amplitude: 0.625 V
Lead Channel Pacing Threshold Pulse Width: 0.5 ms
Lead Channel Pacing Threshold Pulse Width: 0.5 ms
Lead Channel Sensing Intrinsic Amplitude: 1.2 mV
Lead Channel Sensing Intrinsic Amplitude: 12 mV
Lead Channel Setting Pacing Amplitude: 0.875
Lead Channel Setting Pacing Amplitude: 1.375
Lead Channel Setting Pacing Pulse Width: 0.5 ms
Lead Channel Setting Sensing Sensitivity: 2 mV
Pulse Gen Model: 2272
Pulse Gen Serial Number: 7883162

## 2019-03-28 NOTE — Patient Instructions (Signed)
Medication Instructions:  Your physician recommends that you continue on your current medications as directed. Please refer to the Current Medication list given to you today.  If you need a refill on your cardiac medications before your next appointment, please call your pharmacy.   Lab work: None Ordered   Testing/Procedures: None Ordered   Follow-Up: Your physician recommends that you schedule a follow-up appointment in: 1 year with Dr. Taylor  

## 2019-03-28 NOTE — Progress Notes (Signed)
HPI Gabrielle Hoffman returns today for ongoing evaluation and management of her PPM. She is a pleasant 85 yo woman who has developed dementia and weight loss. Her son who is with her today notes that she remains forgetful and confused. No chest pain or sob. No anginal symptoms. Her son notes that her hands are cold. Allergies  Allergen Reactions  . Sulfa Antibiotics Rash  . Sulfamethoxazole Rash     Current Outpatient Medications  Medication Sig Dispense Refill  . acetaminophen (TYLENOL) 325 MG tablet Take 650 mg by mouth every 6 (six) hours as needed for mild pain, moderate pain or fever.     Marland Kitchen amLODipine (NORVASC) 5 MG tablet Take 5 mg by mouth daily.    Marland Kitchen donepezil (ARICEPT) 10 MG tablet Take 10 mg by mouth daily.  6  . latanoprost (XALATAN) 0.005 % ophthalmic solution as directed.    . meclizine (ANTIVERT) 12.5 MG tablet Take 12.5 mg by mouth 2 (two) times daily as needed for dizziness.     . Multiple Vitamin (MULTIVITAMIN WITH MINERALS) TABS tablet Take 1 tablet by mouth daily.     No current facility-administered medications for this visit.      Past Medical History:  Diagnosis Date  . Asthma   . DDD (degenerative disc disease), lumbar   . Dementia    very mild   . Depression   . Disc disorder of cervical region   . Diverticulosis   . Hyperlipidemia   . Hypertension   . Hyponatremia 12/17/2011  . Kidney stones    "passed them"  . Memory loss   . Mitral valve prolapse   . Osteopenia   . Pneumonia    "I believe I had it a long time ago"  . Presence of permanent cardiac pacemaker   . Syncope   . UTI (lower urinary tract infection)   . Vaginal prolapse   . Venous insufficiency     ROS:   All systems reviewed and negative except as noted in the HPI.   Past Surgical History:  Procedure Laterality Date  . ABDOMINAL HYSTERECTOMY    . APPENDECTOMY    . EP IMPLANTABLE DEVICE N/A 08/27/2015   Procedure: Loop Recorder Insertion;  Surgeon: Evans Lance, MD;   Location: [place] [facility];  Service: Cardiovascular;  Laterality: N/A;  . EP IMPLANTABLE DEVICE N/A 11/19/2015   Procedure: Pacemaker Implant;  Surgeon: Evans Lance, MD;  Location: [place] [facility];  Service: Cardiovascular;  Laterality: N/A;  . INSERT / REPLACE / REMOVE PACEMAKER  11/19/2015  . TONSILLECTOMY       Family History  Problem Relation Age of Onset  . Cancer Sister      Social History   Socioeconomic History  . Marital status: Widowed    Spouse name: Not on file  . Number of children: Not on file  . Years of education: Not on file  . Highest education level: Not on file  Occupational History  . Not on file  Social Needs  . Financial resource strain: Not on file  . Food insecurity    Worry: Not on file    Inability: Not on file  . Transportation needs    Medical: Not on file    Non-medical: Not on file  Tobacco Use  . Smoking status: Never Smoker  . Smokeless tobacco: Never Used  Substance and Sexual Activity  . Alcohol use: No  . Drug use: No  . Sexual  activity: Not Currently    Birth control/protection: Post-menopausal  Lifestyle  . Physical activity    Days per week: Not on file    Minutes per session: Not on file  . Stress: Not on file  Relationships  . Social Herbalist on phone: Not on file    Gets together: Not on file    Attends religious service: Not on file    Active member of club or organization: Not on file    Attends meetings of clubs or organizations: Not on file    Relationship status: Not on file  . Intimate partner violence    Fear of current or ex partner: Not on file    Emotionally abused: Not on file    Physically abused: Not on file    Forced sexual activity: Not on file  Other Topics Concern  . Not on file  Social History Narrative  . Not on file     BP 130/62   Pulse 84   Ht 5\' 6"  (1.676 m)   Wt 126 lb 6.4 oz (57.3 kg)   SpO2 96%   BMI 20.40 kg/m   Physical Exam:  Well appearing elderly  woman, NAD HEENT: Unremarkable Neck:  No JVD, no thyromegally Lymphatics:  No adenopathy Back:  No CVA tenderness Lungs:  Clear with no wheezes HEART:  Regular rate rhythm, no murmurs, no rubs, no clicks Abd:  soft, positive bowel sounds, no organomegally, no rebound, no guarding Ext:  2 plus pulses, no edema, no cyanosis, no clubbing Skin:  No rashes no nodules Neuro:  CN II through XII intact, motor grossly intact  EKG - nsr with LBBB  DEVICE  Normal device function.  See PaceArt for details.   Assess/Plan: 1. Sinus node dysfunction - she is asymptomatic, s/p PPM insertion. 2. HTN - her bp is minimally elevated. At home it appears to be ok. 3. Weight loss - this has improved as her weight is up 20 lbs.  Gabrielle Hoffman.D.

## 2019-04-07 DIAGNOSIS — N390 Urinary tract infection, site not specified: Secondary | ICD-10-CM | POA: Diagnosis not present

## 2019-05-10 ENCOUNTER — Ambulatory Visit: Payer: Medicare Other | Admitting: *Deleted

## 2019-05-11 LAB — CUP PACEART REMOTE DEVICE CHECK
Battery Remaining Longevity: 133 mo
Battery Remaining Percentage: 95.5 %
Battery Voltage: 3.01 V
Brady Statistic AP VP Percent: 1 %
Brady Statistic AP VS Percent: 1.8 %
Brady Statistic AS VP Percent: 1 %
Brady Statistic AS VS Percent: 97 %
Brady Statistic RA Percent Paced: 1 %
Brady Statistic RV Percent Paced: 1 %
Date Time Interrogation Session: 20201005060014
Implantable Lead Implant Date: 20170417
Implantable Lead Implant Date: 20170417
Implantable Lead Location: 753859
Implantable Lead Location: 753860
Implantable Pulse Generator Implant Date: 20170417
Lead Channel Impedance Value: 400 Ohm
Lead Channel Impedance Value: 450 Ohm
Lead Channel Pacing Threshold Amplitude: 0.375 V
Lead Channel Pacing Threshold Amplitude: 0.625 V
Lead Channel Pacing Threshold Pulse Width: 0.5 ms
Lead Channel Pacing Threshold Pulse Width: 0.5 ms
Lead Channel Sensing Intrinsic Amplitude: 1.1 mV
Lead Channel Sensing Intrinsic Amplitude: 12 mV
Lead Channel Setting Pacing Amplitude: 0.875
Lead Channel Setting Pacing Amplitude: 1.375
Lead Channel Setting Pacing Pulse Width: 0.5 ms
Lead Channel Setting Sensing Sensitivity: 2 mV
Pulse Gen Model: 2272
Pulse Gen Serial Number: 7883162

## 2019-05-27 DIAGNOSIS — H8113 Benign paroxysmal vertigo, bilateral: Secondary | ICD-10-CM | POA: Diagnosis not present

## 2019-05-27 DIAGNOSIS — Z79899 Other long term (current) drug therapy: Secondary | ICD-10-CM | POA: Diagnosis not present

## 2019-05-27 DIAGNOSIS — F325 Major depressive disorder, single episode, in full remission: Secondary | ICD-10-CM | POA: Diagnosis not present

## 2019-05-27 DIAGNOSIS — G301 Alzheimer's disease with late onset: Secondary | ICD-10-CM | POA: Diagnosis not present

## 2019-05-27 DIAGNOSIS — R7989 Other specified abnormal findings of blood chemistry: Secondary | ICD-10-CM | POA: Diagnosis not present

## 2019-05-27 DIAGNOSIS — F0280 Dementia in other diseases classified elsewhere without behavioral disturbance: Secondary | ICD-10-CM | POA: Diagnosis not present

## 2019-05-27 DIAGNOSIS — I10 Essential (primary) hypertension: Secondary | ICD-10-CM | POA: Diagnosis not present

## 2019-05-27 DIAGNOSIS — R350 Frequency of micturition: Secondary | ICD-10-CM | POA: Diagnosis not present

## 2019-06-13 ENCOUNTER — Other Ambulatory Visit: Payer: Self-pay

## 2019-06-13 ENCOUNTER — Ambulatory Visit: Payer: Medicare Other | Attending: Geriatric Medicine

## 2019-06-13 DIAGNOSIS — R2689 Other abnormalities of gait and mobility: Secondary | ICD-10-CM | POA: Diagnosis not present

## 2019-06-13 DIAGNOSIS — R42 Dizziness and giddiness: Secondary | ICD-10-CM | POA: Diagnosis not present

## 2019-06-13 DIAGNOSIS — H8112 Benign paroxysmal vertigo, left ear: Secondary | ICD-10-CM | POA: Diagnosis not present

## 2019-06-13 NOTE — Therapy (Signed)
[facility] [address] [place], [place], [address] Phone: [phone]   Fax:  [phone]  Physical Therapy Evaluation  Patient Details  Name: Gabrielle Hoffman MRN: [id number] Date of Birth: [birth year] Referring Provider (PT): Dr. Felipa Eth   Encounter Date: 06/13/2019  PT End of Session - 06/13/19 1640    Visit Number  1    Number of Visits  5    Date for PT Re-Evaluation  08/12/19    Authorization Type  Medicare and BCBS: progress note every 10th visit.    PT Start Time  1536    PT Stop Time  1627    PT Time Calculation (min)  51 min    Equipment Utilized During Treatment  Other (comment)   min guard to S prn   Activity Tolerance  Patient tolerated treatment well    Behavior During Therapy  WFL for tasks assessed/performed       Past Medical History:  Diagnosis Date  . Asthma   . DDD (degenerative disc disease), lumbar   . Dementia    very mild   . Depression   . Disc disorder of cervical region   . Diverticulosis   . Hyperlipidemia   . Hypertension   . Hyponatremia 12/17/2011  . Kidney stones    "passed them"  . Memory loss   . Mitral valve prolapse   . Osteopenia   . Pneumonia    "I believe I had it a long time ago"  . Presence of permanent cardiac pacemaker   . Syncope   . UTI (lower urinary tract infection)   . Vaginal prolapse   . Venous insufficiency     Past Surgical History:  Procedure Laterality Date  . ABDOMINAL HYSTERECTOMY    . APPENDECTOMY    . EP IMPLANTABLE DEVICE N/A 08/27/2015   Procedure: Loop Recorder Insertion;  Surgeon: Evans Lance, MD;  Location: [facility];  Service: Cardiovascular;  Laterality: N/A;  . EP IMPLANTABLE DEVICE N/A 11/19/2015   Procedure: Pacemaker Implant;  Surgeon: Evans Lance, MD;  Location: [facility];  Service: Cardiovascular;  Laterality: N/A;  . INSERT / REPLACE / REMOVE PACEMAKER  11/19/2015  . TONSILLECTOMY      There were  no vitals filed for this visit.   Subjective Assessment - 06/13/19 1545    Subjective  Pt's son, [name], provided medical hx based on pt's dementia. Pt's dizziness began a few years ago, especially when she goes to the beauty shop-last bout was two weeks ago. Pt has not taken meclizine in a few weeks. Pt reported dizziness is worse in the morning when getting up from bed. 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 Pt denied tinnitus but had testing and does have significant LOH-cannot afford hearing aides. Pt denied weakness, severe HA, N/T with dizziness.    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    Currently in Pain?  No/denies         [facility] PT Assessment - 06/13/19 1552      Assessment   Medical Diagnosis  BPPV    Referring Provider (PT)  Dr. Felipa Eth    Onset Date/Surgical Date  05/30/19   has been going on for years   Hand Dominance  Right    Prior Therapy  none      Precautions   Precautions  Fall;ICD/Pacemaker      Restrictions   Weight Bearing Restrictions  No      Balance Screen   Has the patient fallen in the past 6 months  No    Is the patient reluctant to leave their home because of a fear of falling?   No      Home Environment   Living Environment  Private residence    Living Arrangements  Children   [place]   Available Help at Discharge  Available 24 hours/day    Type of [place] to enter    Entrance Stairs-Number of Steps  2   in back, 5 steps in front   Entrance Stairs-Rails  Right   front has rails on both sides   [place] to live on main level with bedroom/bathroom;Two level   basement downstairs   Alternate Level Stairs-Number of Steps  12    Alternate Level  Stairs-Rails  Left    Home Equipment  Cane - single point;Walker - 2 wheels;Shower seat;Grab bars - tub/shower      Prior Function   Level of Independence  Independent with basic ADLs;Independent with gait    Vocation  Retired    Leisure  Walking, go to church, out to eat, go to [employer]   Overall Cognitive Status  History of cognitive impairments - at baseline      Ambulation/Gait   Ambulation/Gait  Yes    Ambulation/Gait Assistance  5: Supervision    Ambulation/Gait Assistance Details  Shuffle gait    Ambulation Distance (Feet)  100 Feet    Assistive device  None    Gait Pattern  Step-through pattern;Decreased stride length;Shuffle;Decreased dorsiflexion - right;Decreased dorsiflexion - left    Ambulation Surface  Level;Indoor    Gait velocity  2.72f/sec. no AD            Vestibular Assessment - 06/13/19 1602      Symptom Behavior   Subjective history of current problem  See assessment    Type of Dizziness   --   unable to state   Frequency of Dizziness  Intermittent    Duration of Dizziness  usually a few minutes, but lasted all day last episode    Symptom Nature  Positional    Aggravating Factors  Supine to sit;Sit to stand;Looking up to the ceiling    Relieving Factors  Rest;Slow movements    Progression of Symptoms  Worse      Oculomotor Exam   Oculomotor Alignment  Normal    Spontaneous  Absent    Gaze-induced   Absent    Smooth Pursuits  Intact    Saccades  Intact    Comment  No dizziness reported.       Vestibulo-Ocular Reflex   VOR 1 Head Only (x 1 viewing)  Difficulty following commands but denied dizziness.       Positional Testing   Sidelying Test  Sidelying Right;Sidelying Left    Horizontal Canal Testing  Horizontal Canal Right;Horizontal Canal Left      Sidelying Right   Sidelying Right Duration  pt reported brief nausea-could have been 2/2 L sidelying testing.    Sidelying Right Symptoms  No nystagmus  Sidelying Left    Sidelying Left Duration  10 sec.    Sidelying Left Symptoms  Upbeat, left rotatory nystagmus      Horizontal Canal Right   Horizontal Canal Right Duration  0    Horizontal Canal Right Symptoms  Normal      Horizontal Canal Left   Horizontal Canal Left Duration  0    Horizontal Canal Left Symptoms  Normal          Objective measurements completed on examination: See above findings.       Vestibular Treatment/Exercise - 06/13/19 1634      Vestibular Treatment/Exercise   Vestibular Treatment Provided  Canalith Repositioning    Canalith Repositioning  Semont Procedure Right Posterior      Semont Procedure Right Posterior   Number of Reps   2    Overall Response   --   see details   Response Details   Pt reported slight dizziness (unable to rate) and nausea during testing and treatment. PT did not reassess after second treatment 2/2 nausea.             PT Education - 06/13/19 1635    Education Details  PT educated pt and son on BPPV, outcome measures, and PT POC, frequency, and duration.    Person(s) Educated  Patient;Child(ren)    Methods  Explanation;Demonstration;Verbal cues    Comprehension  Verbalized understanding;Returned demonstration;Need further instruction       PT Short Term Goals - 06/13/19 1648      PT SHORT TERM GOAL #1   Title  same as LTGs        PT Long Term Goals - 06/13/19 1648      PT LONG TERM GOAL #1   Title  Pt will report 0/10 (OR NONE) dizziness during all positional testing to safely perform ADLs. TARGET DATE FOR ALL LTGS: 07/11/19    Baseline  Pt unable to rate, only state she has dizziness and nausea    Time  4    Period  Weeks    Status  New      PT LONG TERM GOAL #2   Title  Pt will report no dizziness when going to the beauty parlor and when performing yard work to improve QOL.    Time  4    Period  Weeks    Status  New      PT LONG TERM GOAL #3   Title  Perform balance test: DGI and write goal and provide HEP as  indicated.    Time  4    Period  Weeks    Status  New             Plan - 06/13/19 1643    Clinical Impression Statement  Pt is a pleasant 85y/o female presenting to [facility] neuro for BPPV. 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 Pt's vestibular exam findings were consisent with L pBPPV canalithiasis, as pt experienced concordant dizziness and L upbeating rotary nystagmus during L sidelying testing. Pt had difficulty following instructions 2/2 hx of dementia, therefore, PT performed sidelying testing and treatment vs. Dix-Hallpike and Epley's but may try thist next session if BPPV is not resolved. The following deficits were noted upon exam: gait deviations, dizziness, and impaired balance. Pt would benefit from skilled PT to improve dizziness and safety during functional mobility.    Personal Factors and Comorbidities  Age;Behavior Pattern;Comorbidity 3+;Transportation;Past/Current  Experience    Examination-Activity Limitations  Bed Mobility;Bend;Locomotion Level;Transfers;Stand    Examination-Participation Restrictions  Church;Community Activity;Yard Work    [employer]chant [employer] officer  Evolving/Moderate complexity    Clinical Decision Making  Moderate    Rehab Potential  Good    PT Frequency  1x / week    PT Duration  4 weeks    PT Treatment/Interventions  ADLs/Self Care Home Management;Canalith Repostioning;DME Instruction;Balance training;Therapeutic exercise;Therapeutic activities;Functional mobility training;Stair training;Gait training;Neuromuscular re-education;Cognitive remediation;Patient/family education;Vestibular    PT Next Visit Plan  Reassess for L pBPPV and treat as indicated. Perform balance screen and provide HEP as indicated. Pt has dementia-discuss carryover with pt's son.    Consulted and Agree with Plan of Care  Patient;Family member/caregiver    Family Member Consulted  pt's  son: [name]       Patient will benefit from skilled therapeutic intervention in order to improve the following deficits and impairments:  Abnormal gait, Decreased balance, Dizziness  Visit Diagnosis: BPPV (benign paroxysmal positional vertigo), left - Plan: PT plan of care cert/re-cert  Dizziness and giddiness - Plan: PT plan of care cert/re-cert  Other abnormalities of gait and mobility - Plan: PT plan of care cert/re-cert     Problem List Patient Active Problem List   Diagnosis Date Noted  . Intermittent complete heart block ([place]) 11/19/2015  . Chronic systolic heart failure ([place])   . Cognitive change 11/18/2015  . Arrhythmia 11/18/2015  . Syncope and collapse 11/18/2015  . Syncope 07/11/2015  . Low back pain 12/19/2011  . Hyponatremia 12/17/2011  . Dehydration 12/17/2011  . UTI (lower urinary tract infection) 12/17/2011  . HTN (hypertension) 12/17/2011  . Dementia ([place]) 12/17/2011  . 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/12/2011  . Open angle with borderline findings, low risk 05/12/2011  . Dermatochalasis 05/12/2011    , L 06/13/2019, 4:52 PM  [facility] [address] [place], [place], [address] Phone: [phone]   Fax:  [phone]  Name: ANGINETTE ESPEJO MRN: [id number] Date of Birth: [birth year]   Geoffry Paradise, PT,DPT 06/13/19 4:52 PM Phone: [phone] Fax: [phone]

## 2019-07-08 ENCOUNTER — Ambulatory Visit: Payer: Medicare Other | Attending: Geriatric Medicine | Admitting: Physical Therapy

## 2019-07-08 ENCOUNTER — Other Ambulatory Visit: Payer: Self-pay

## 2019-07-08 ENCOUNTER — Encounter: Payer: Self-pay | Admitting: Physical Therapy

## 2019-07-08 DIAGNOSIS — R2689 Other abnormalities of gait and mobility: Secondary | ICD-10-CM | POA: Insufficient documentation

## 2019-07-08 DIAGNOSIS — H8112 Benign paroxysmal vertigo, left ear: Secondary | ICD-10-CM | POA: Diagnosis present

## 2019-07-08 DIAGNOSIS — R42 Dizziness and giddiness: Secondary | ICD-10-CM | POA: Insufficient documentation

## 2019-07-08 NOTE — Therapy (Signed)
[facility] [address] [place], [place], [address] Phone: [phone]   Fax:  [phone]  Physical Therapy Treatment  Patient Details  Name: Gabrielle Hoffman MRN: [id number] Date of Birth: 05-22-1931 Referring Provider (PT): Dr. Felipa Eth   Encounter Date: 07/08/2019  PT End of Session - 07/08/19 1541    Visit Number  2    Number of Visits  5    Date for PT Re-Evaluation  08/12/19    Authorization Type  Medicare and BCBS: progress note every 10th visit.    PT Start Time  1536    PT Stop Time  1616    PT Time Calculation (min)  40 min    Equipment Utilized During Treatment  --    Activity Tolerance  Patient tolerated treatment well    Behavior During Therapy  WFL for tasks assessed/performed       Past Medical History:  Diagnosis Date  . Asthma   . DDD (degenerative disc disease), lumbar   . Dementia    very mild   . Depression   . Disc disorder of cervical region   . Diverticulosis   . Hyperlipidemia   . Hypertension   . Hyponatremia 12/17/2011  . Kidney stones    "passed them"  . Memory loss   . Mitral valve prolapse   . Osteopenia   . Pneumonia    "I believe I had it a long time ago"  . Presence of permanent cardiac pacemaker   . Syncope   . UTI (lower urinary tract infection)   . Vaginal prolapse   . Venous insufficiency     Past Surgical History:  Procedure Laterality Date  . ABDOMINAL HYSTERECTOMY    . APPENDECTOMY    . EP IMPLANTABLE DEVICE N/A 08/27/2015   Procedure: Loop Recorder Insertion;  Surgeon: Evans Lance, MD;  Location: [facility];  Service: Cardiovascular;  Laterality: N/A;  . EP IMPLANTABLE DEVICE N/A 11/19/2015   Procedure: Pacemaker Implant;  Surgeon: Evans Lance, MD;  Location: [facility];  Service: Cardiovascular;  Laterality: N/A;  . INSERT / REPLACE / REMOVE PACEMAKER  11/19/2015  . TONSILLECTOMY      There were no vitals filed for this  visit.  Subjective Assessment - 07/08/19 1540    Subjective  Pt's son, [name], states that pt has had some dizziness since the last session. He states that she has had difficulty with understanding what's going on d/t dx of dementia.    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    Currently in Pain?  No/denies         [facility] PT Assessment - 07/08/19 1604      Standardized Balance Assessment   Standardized Balance Assessment  Dynamic Gait Index      Dynamic Gait Index   Level Surface  Mild Impairment    Change in Gait Speed  Normal    Gait with Horizontal Head Turns  Normal    Gait with Vertical Head Turns  Moderate Impairment    Gait and Pivot Turn  Mild Impairment    Step Over Obstacle  Normal    Step Around Obstacles  Normal    Steps  Moderate Impairment    Total Score  18  DGI comment:  18/24 indicates fall risk          Vestibular Assessment - 07/08/19 1542      Positional Testing   Dix-Hallpike  Dix-Hallpike Left    Horizontal Canal Testing  Horizontal Canal Left      Dix-Hallpike Left   Dix-Hallpike Left Duration  10 seconds     Dix-Hallpike Left Symptoms  Upbeat, left rotatory nystagmus   pt stated feeling tense, some spinning      Horizontal Canal Right   Horizontal Canal Right Duration  10 sec    Horizontal Canal Right Symptoms  Geotrophic               [facility] Adult PT Treatment/Exercise - 07/08/19 1611      Ambulation/Gait   Ambulation/Gait  Yes    Ambulation/Gait Assistance  5: Supervision    Ambulation Distance (Feet)  345 Feet   laps around track during DGI assessment   Assistive device  None    Gait Pattern  Step-through pattern;Decreased stride length;Shuffle;Decreased dorsiflexion - right;Decreased dorsiflexion - left    Ambulation Surface  Level;Indoor    Stairs  Yes     Stairs Assistance  5: Supervision    Stair Management Technique  Two rails;Alternating pattern;Step to pattern   ascend = alternating, descned = step to   Number of Stairs  4    Height of Stairs  6      Vestibular Treatment/Exercise - 07/08/19 1544      Vestibular Treatment/Exercise   Vestibular Treatment Provided  Canalith Repositioning    Canalith Repositioning  Epley Manuever Left;Canal Roll Left       EPLEY MANUEVER LEFT   Number of Reps   1    Overall Response   Improved Symptoms     RESPONSE DETAILS LEFT  Pt reported being a little 'woozy' afterwards. Attempted L dix hallpike again and noticed conversion to horizontal canal BPPV.       Canal Roll Left   Number of Reps   1    Overall Response   Improved Symptoms              PT Short Term Goals - 06/13/19 1648      PT SHORT TERM GOAL #1   Title  same as LTGs        PT Long Term Goals - 07/08/19 1620      PT LONG TERM GOAL #1   Title  Pt will report 0/10 (OR NONE) dizziness during all positional testing to safely perform ADLs. TARGET DATE FOR ALL LTGS: 07/11/19    Baseline  Pt unable to rate, only state she has dizziness and nausea    Time  4    Period  Weeks    Status  New      PT LONG TERM GOAL #2   Title  Pt will report no dizziness when going to the beauty parlor and when performing yard work to improve QOL.    Time  4    Period  Weeks    Status  New      PT LONG TERM GOAL #3   Title  Patient will improve DGI >19/24 to indicate lower fall risk.    Baseline  07/08/19: 18/24    Time  4    Period  Weeks    Status  Revised            Plan - 07/08/19 1613    Clinical Impression Statement  Pt  presented with L rotatory and upbeating nystagmus upon L [employer] reassessment. Performed L Epley maneuver. When rechecking L Marye Round, noted nystagmus indicating conversion to horizontal canal. Assessed for L horizontal canal BPPV, pt demonstrated geotropic nystagmus and PT treated accordingly. Pt  scored 18/24 on the DGI indicating fall risk. She was challenged with head nods and stairs requiring bilateral rails and step to pattern when descending. Pt intermittently asked throughout session why she was doing the exercises that were asked of her but did not become agitated. Pt's will benefit from skilled therapy services to address deficits.    Personal Factors and Comorbidities  Age;Behavior Pattern;Comorbidity 3+;Transportation;Past/Current Experience    Examination-Activity Limitations  Bed Mobility;Bend;Locomotion Level;Transfers;Stand    Examination-Participation Restrictions  Church;Community Activity;Yard Work    Merchant [employer] officer  Evolving/Moderate complexity    Rehab Potential  Good    PT Frequency  1x / week    PT Duration  4 weeks    PT Treatment/Interventions  ADLs/Self Care Home Management;Canalith Repostioning;DME Instruction;Balance training;Therapeutic exercise;Therapeutic activities;Functional mobility training;Stair training;Gait training;Neuromuscular re-education;Cognitive remediation;Patient/family education;Vestibular    PT Next Visit Plan  Reassess for L hBPPV & and treat as indicated. Initiate HEP as indicated. Begin standing & dynamic balance - address SLS to decrease shuffling gait. Teach son [name]) BPPV treatments.Pt has dementia-discuss carryover with pt's son.    Consulted and Agree with Plan of Care  Patient;Family member/caregiver    Family Member Consulted  pt's son: [name]       Patient will benefit from skilled therapeutic intervention in order to improve the following deficits and impairments:  Abnormal gait, Decreased balance, Dizziness  Visit Diagnosis: BPPV (benign paroxysmal positional vertigo), left  Dizziness and giddiness  Other abnormalities of gait and mobility     Problem List Patient Active Problem List   Diagnosis Date Noted  . Intermittent complete heart block ([place]) 11/19/2015  . Chronic systolic heart failure ([place])    . Cognitive change 11/18/2015  . Arrhythmia 11/18/2015  . Syncope and collapse 11/18/2015  . Syncope 07/11/2015  . Low back pain 12/19/2011  . Hyponatremia 12/17/2011  . Dehydration 12/17/2011  . UTI (lower urinary tract infection) 12/17/2011  . HTN (hypertension) 12/17/2011  . Dementia ([place]) 12/17/2011  . 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/12/2011  . Open angle with borderline findings, low risk 05/12/2011  . Dermatochalasis 05/12/2011   Juliann Pulse SPT  Juliann Pulse 07/08/2019, 4:27 PM  [facility] [address] [place], [place], [address] Phone: [phone]   Fax:  [phone]  Name: KANOE OLAND MRN: [id number] Date of Birth: 04-11-1931

## 2019-07-11 DIAGNOSIS — Z1211 Encounter for screening for malignant neoplasm of colon: Secondary | ICD-10-CM | POA: Diagnosis not present

## 2019-07-15 ENCOUNTER — Ambulatory Visit: Payer: Medicare Other | Admitting: Physical Therapy

## 2019-07-15 ENCOUNTER — Encounter: Payer: Self-pay | Admitting: Physical Therapy

## 2019-07-15 ENCOUNTER — Other Ambulatory Visit: Payer: Self-pay

## 2019-07-15 DIAGNOSIS — H8112 Benign paroxysmal vertigo, left ear: Secondary | ICD-10-CM | POA: Diagnosis not present

## 2019-07-15 DIAGNOSIS — R2689 Other abnormalities of gait and mobility: Secondary | ICD-10-CM

## 2019-07-15 DIAGNOSIS — R42 Dizziness and giddiness: Secondary | ICD-10-CM

## 2019-07-15 NOTE — Therapy (Signed)
[facility] [address] [place], [place], [address] Phone: [phone]   Fax:  [phone]  Physical Therapy Treatment  Patient Details  Name: Gabrielle Hoffman MRN: [id number] Date of Birth: Apr 18, 1931 Referring Provider (PT): Dr. Felipa Eth   Encounter Date: 07/15/2019  PT End of Session - 07/15/19 1619    Visit Number  3    Number of Visits  5    Date for PT Re-Evaluation  08/12/19    Authorization Type  Medicare and BCBS: progress note every 10th visit.    PT Start Time  1535    PT Stop Time  1619    PT Time Calculation (min)  44 min    Activity Tolerance  Patient tolerated treatment well    Behavior During Therapy  WFL for tasks assessed/performed       Past Medical History:  Diagnosis Date  . Asthma   . DDD (degenerative disc disease), lumbar   . Dementia    very mild   . Depression   . Disc disorder of cervical region   . Diverticulosis   . Hyperlipidemia   . Hypertension   . Hyponatremia 12/17/2011  . Kidney stones    "passed them"  . Memory loss   . Mitral valve prolapse   . Osteopenia   . Pneumonia    "I believe I had it a long time ago"  . Presence of permanent cardiac pacemaker   . Syncope   . UTI (lower urinary tract infection)   . Vaginal prolapse   . Venous insufficiency     Past Surgical History:  Procedure Laterality Date  . ABDOMINAL HYSTERECTOMY    . APPENDECTOMY    . EP IMPLANTABLE DEVICE N/A 08/27/2015   Procedure: Loop Recorder Insertion;  Surgeon: Evans Lance, MD;  Location: [facility];  Service: Cardiovascular;  Laterality: N/A;  . EP IMPLANTABLE DEVICE N/A 11/19/2015   Procedure: Pacemaker Implant;  Surgeon: Evans Lance, MD;  Location: [facility];  Service: Cardiovascular;  Laterality: N/A;  . INSERT / REPLACE / REMOVE PACEMAKER  11/19/2015  . TONSILLECTOMY      There were no vitals filed for this visit.  Subjective Assessment - 07/15/19 1539    Subjective  Pt did not feel sick or off balance after last session.  Still feeling dizzy when she first gets up.    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    Currently in Pain?  No/denies             Vestibular Assessment - 07/15/19 1540      Positional Testing   Horizontal Canal Testing  Horizontal Canal Left      Dix-Hallpike Left   Dix-Hallpike Left Duration  0    Dix-Hallpike Left Symptoms  No nystagmus      Sidelying Right   Sidelying Right Duration  0    Sidelying Right Symptoms  No nystagmus         Access Code: [email or web address]  URL: [email or web address]  Date: 07/15/2019  Prepared by: Misty Stanley   Exercises Heel Toe Raises with Counter Support - 10 reps - 2 sets - 1x daily - 7x weekly Standing March with Counter Support - 10 reps - 2 sets - 5 second hold - 1x  daily - 7x weekly Standing Hip Abduction with Counter Support - 10 reps - 2 sets - 1x daily - 7x weekly Standing Hip Extension with Counter Support - 10 reps - 2 sets - 1x daily - 7x weekly Sit to Stand - 12 reps - 2 sets - 1x daily - 7x weekly Standing with Eyes Closed - 10 reps - 2 sets - 1x daily - 7x weekly       PT Education - 07/15/19 1618    Education Details  BPPV resolved.  Initiated balance and LE strengthening HEP    Person(s) Educated  Patient;Child(ren)    Methods  Explanation;Demonstration;Handout    Comprehension  Verbalized understanding;Returned demonstration       PT Short Term Goals - 06/13/19 1648      PT SHORT TERM GOAL #1   Title  same as LTGs        PT Long Term Goals - 07/15/19 1638      PT LONG TERM GOAL #1   Title  Pt will report 0/10 (OR NONE) dizziness during all positional testing to safely perform ADLs. TARGET DATE FOR ALL LTGS: 08/12/2019 - goal date extended due to 3 week  delay in start of follow up visits.    Baseline  Pt unable to rate, only state she has dizziness and nausea    Time  4    Period  Weeks    Status  New      PT LONG TERM GOAL #2   Title  Pt will report no dizziness when going to the beauty parlor and when performing yard work to improve QOL.    Time  4    Period  Weeks    Status  New      PT LONG TERM GOAL #3   Title  Patient will improve DGI >19/24 to indicate lower fall risk.    Baseline  07/08/19: 18/24    Time  4    Period  Weeks    Status  Revised            Plan - 07/15/19 1635    Clinical Impression Statement  Pt presented with resolution of BPPV today in L posterior and horizontal canals.  Initiated standing balance and LE strengthening HEP with use of counter top for support if needed.  Reviewed all exercises and safety instructions with son to ensure safe performance and carry over at home.  Will continue to monitor for return of BPPV and will continue to progress balance training.    Personal Factors and Comorbidities  Age;Behavior Pattern;Comorbidity 3+;Transportation;Past/Current Experience    Examination-Activity Limitations  Bed Mobility;Bend;Locomotion Level;Transfers;Stand    Examination-Participation Restrictions  Church;Community Activity;Yard Work    [employer]chant [employer] officer  Evolving/Moderate complexity    Rehab Potential  Good    PT Frequency  1x / week    PT Duration  4 weeks    PT Treatment/Interventions  ADLs/Self Care Home Management;Canalith Repostioning;DME Instruction;Balance training;Therapeutic exercise;Therapeutic activities;Functional mobility training;Stair training;Gait training;Neuromuscular re-education;Cognitive remediation;Patient/family education;Vestibular    PT Next Visit Plan  Needs to add more visits - 1x/week through 1/8.  Assess and treat BPPV as needed.  Did they have any questions about HEP?  Continue higher level balance training, compliant surfaces.    Consulted and Agree  with Plan of Care  Patient;Family member/caregiver    Family Member Consulted  pt's son: [name]       Patient will benefit from skilled therapeutic intervention in order to improve the  following deficits and impairments:  Abnormal gait, Decreased balance, Dizziness  Visit Diagnosis: BPPV (benign paroxysmal positional vertigo), left  Dizziness and giddiness  Other abnormalities of gait and mobility     Problem List Patient Active Problem List   Diagnosis Date Noted  . Intermittent complete heart block ([place] [facility]) 11/19/2015  . Chronic systolic heart failure ([facility])   . Cognitive change 11/18/2015  . Arrhythmia 11/18/2015  . Syncope and collapse 11/18/2015  . Syncope 07/11/2015  . Low back pain 12/19/2011  . Hyponatremia 12/17/2011  . Dehydration 12/17/2011  . UTI (lower urinary tract infection) 12/17/2011  . HTN (hypertension) 12/17/2011  . Dementia ([facility]) 12/17/2011  . 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/12/2011  . Open angle with borderline findings, low risk 05/12/2011  . Dermatochalasis 05/12/2011    Rico Junker, PT, DPT 07/15/19    4:42 PM    [facility] [address] [place], [place], [address] Phone: [phone]   Fax:  [phone]  Name: Gabrielle Hoffman MRN: [id number] Date of Birth: 1931-03-16

## 2019-07-15 NOTE — Patient Instructions (Signed)
Access Code: [email or web address]  URL: [email or web address]  Date: 07/15/2019  Prepared by: Misty Stanley   Exercises Heel Toe Raises with Counter Support - 10 reps - 2 sets - 1x daily - 7x weekly Standing March with Counter Support - 10 reps - 2 sets - 5 second hold - 1x daily - 7x weekly Standing Hip Abduction with Counter Support - 10 reps - 2 sets - 1x daily - 7x weekly Standing Hip Extension with Counter Support - 10 reps - 2 sets - 1x daily - 7x weekly Sit to Stand - 12 reps - 2 sets - 1x daily - 7x weekly Standing with Eyes Closed - 10 reps - 2 sets - 1x daily - 7x weekly

## 2019-07-22 ENCOUNTER — Other Ambulatory Visit: Payer: Self-pay

## 2019-07-22 ENCOUNTER — Ambulatory Visit: Payer: Medicare Other | Admitting: Physical Therapy

## 2019-07-22 ENCOUNTER — Encounter: Payer: Self-pay | Admitting: Physical Therapy

## 2019-07-22 DIAGNOSIS — H8112 Benign paroxysmal vertigo, left ear: Secondary | ICD-10-CM

## 2019-07-22 DIAGNOSIS — R2689 Other abnormalities of gait and mobility: Secondary | ICD-10-CM

## 2019-07-22 DIAGNOSIS — R42 Dizziness and giddiness: Secondary | ICD-10-CM

## 2019-07-22 NOTE — Therapy (Signed)
[facility] [address] [place], [place], [address] Phone: [phone]   Fax:  [phone]  Physical Therapy Treatment  Patient Details  Name: Gabrielle Hoffman MRN: [id number] Date of Birth: Jan 21, 1931 Referring Provider (PT): Dr. Felipa Eth   Encounter Date: 07/22/2019  PT End of Session - 07/22/19 1536    Visit Number  4    Number of Visits  5    Date for PT Re-Evaluation  08/12/19    Authorization Type  Medicare and BCBS: progress note every 10th visit.    PT Start Time  0340    PT Stop Time  0425    PT Time Calculation (min)  45 min    Activity Tolerance  Patient tolerated treatment well    Behavior During Therapy  WFL for tasks assessed/performed       Past Medical History:  Diagnosis Date  . Asthma   . DDD (degenerative disc disease), lumbar   . Dementia    very mild   . Depression   . Disc disorder of cervical region   . Diverticulosis   . Hyperlipidemia   . Hypertension   . Hyponatremia 12/17/2011  . Kidney stones    "passed them"  . Memory loss   . Mitral valve prolapse   . Osteopenia   . Pneumonia    "I believe I had it a long time ago"  . Presence of permanent cardiac pacemaker   . Syncope   . UTI (lower urinary tract infection)   . Vaginal prolapse   . Venous insufficiency     Past Surgical History:  Procedure Laterality Date  . ABDOMINAL HYSTERECTOMY    . APPENDECTOMY    . EP IMPLANTABLE DEVICE N/A 08/27/2015   Procedure: Loop Recorder Insertion;  Surgeon: Evans Lance, MD;  Location: [facility];  Service: Cardiovascular;  Laterality: N/A;  . EP IMPLANTABLE DEVICE N/A 11/19/2015   Procedure: Pacemaker Implant;  Surgeon: Evans Lance, MD;  Location: [facility];  Service: Cardiovascular;  Laterality: N/A;  . INSERT / REPLACE / REMOVE PACEMAKER  11/19/2015  . TONSILLECTOMY      There were no vitals filed for this visit.  Subjective Assessment - 07/22/19 1536    Subjective  Has not had any episodes of dizziness or feeling unsteady. HEP exercises are going well - no questions. Pt and son has tried walking around the block at home.    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    Currently in Pain?  No/denies                            Balance Exercises - 07/22/19 1628      Balance Exercises: Standing   Standing Eyes Opened  Narrow base of support (BOS);Head turns;Foam/compliant surface;Solid surface;30 secs   firm > compliant; min hip swaying   Step Ups  Forward;Lateral;6 inch;UE support 1;UE support 2   x10 each in // bars   Balance Beam  Forward gait on blue foam beam x5 with minA for steadying; intermittent UE support, gradually dec UE support from 2 > 1 > 0 with initermittent touch down    Sidestepping  Foam/compliant support;Upper extremity support;2 reps   x2 each direction, blue  foam beam   Step Over Hurdles / Cones  Forward gait and side stepping negotiating 3 hurdles x4 each direction    Marching  Foam/compliant surface   2x10 each LE with cues for hip flexion    Other Standing Exercises  Cone taps on firm surface, gradually increasing SLS time with consecutive cone taps; intermittent UE support on wall required           PT Short Term Goals - 06/13/19 1648      PT SHORT TERM GOAL #1   Title  same as LTGs        PT Long Term Goals - 07/15/19 1638      PT LONG TERM GOAL #1   Title  Pt will report 0/10 (OR NONE) dizziness during all positional testing to safely perform ADLs. TARGET DATE FOR ALL LTGS: 08/12/2019 - goal date extended due to 3 week delay in start of follow up visits.    Baseline  Pt unable to rate, only state she has dizziness and nausea    Time  4    Period  Weeks    Status  New      PT LONG TERM GOAL #2   Title  Pt will  report no dizziness when going to the beauty parlor and when performing yard work to improve QOL.    Time  4    Period  Weeks    Status  New      PT LONG TERM GOAL #3   Title  Patient will improve DGI >19/24 to indicate lower fall risk.    Baseline  07/08/19: 18/24    Time  4    Period  Weeks    Status  Revised            Plan - 07/22/19 1537    Clinical Impression Statement  Today's session focused on initiating higher level balance activities. Patient tolerated progression of exercise from firm to compliant surface well with minimal hip swaying but requires intermittent UE support. Patient requires mod-max cueing for technique and upright posture. She demonstrates posterior chain weakness, greater on the LLE compared to RLE, noted by compensating with hip ER during side stepping and lateral step up exercises. Will continue to progress LE strengthening and balance exercises. Patient may benefit from skilled therapy services to address deficits and progress towards LTGs.    Personal Factors and Comorbidities  Age;Behavior Pattern;Comorbidity 3+;Transportation;Past/Current Experience    Examination-Activity Limitations  Bed Mobility;Bend;Locomotion Level;Transfers;Stand    Examination-Participation Restrictions  Church;Community Activity;Yard Work    [employer] officer  Evolving/Moderate complexity    Rehab Potential  Good    PT Frequency  1x / week    PT Duration  4 weeks    PT Treatment/Interventions  ADLs/Self Care Home Management;Canalith Repostioning;DME Instruction;Balance training;Therapeutic exercise;Therapeutic activities;Functional mobility training;Stair training;Gait training;Neuromuscular re-education;Cognitive remediation;Patient/family education;Vestibular    PT Next Visit Plan  Assess and treat BPPV as needed. Continue higher level balance training, compliant surfaces. Dynamic balance compliant surface.    Consulted and Agree with Plan of Care   Patient;Family member/caregiver    Family Member Consulted  pt's son: [name]       Patient will benefit from skilled therapeutic intervention in order to improve the following deficits and impairments:  Abnormal gait, Decreased balance, Dizziness  Visit Diagnosis: BPPV (benign paroxysmal positional vertigo), left  Dizziness and giddiness  Other abnormalities of gait and mobility     Problem List Patient Active Problem List  Diagnosis Date Noted  . Intermittent complete heart block ([place]) 11/19/2015  . Chronic systolic heart failure ([facility])   . Cognitive change 11/18/2015  . Arrhythmia 11/18/2015  . Syncope and collapse 11/18/2015  . Syncope 07/11/2015  . Low back pain 12/19/2011  . Hyponatremia 12/17/2011  . Dehydration 12/17/2011  . UTI (lower urinary tract infection) 12/17/2011  . HTN (hypertension) 12/17/2011  . Dementia ([facility]) 12/17/2011  . 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/12/2011  . Open angle with borderline findings, low risk 05/12/2011  . Dermatochalasis 05/12/2011    Juliann Pulse SPT 07/22/2019, 4:42 PM  [facility] [address] [place], [place], [address] Phone: [phone]   Fax:  [phone]  Name: Gabrielle Hoffman MRN: [id number] Date of Birth: November 26, 1930

## 2019-08-09 ENCOUNTER — Ambulatory Visit (INDEPENDENT_AMBULATORY_CARE_PROVIDER_SITE_OTHER): Payer: Medicare Other | Admitting: *Deleted

## 2019-08-09 DIAGNOSIS — R55 Syncope and collapse: Secondary | ICD-10-CM

## 2019-08-09 LAB — CUP PACEART REMOTE DEVICE CHECK
Date Time Interrogation Session: 20210104070738
Implantable Lead Implant Date: 20170417
Implantable Lead Implant Date: 20170417
Implantable Lead Location: 753859
Implantable Lead Location: 753860
Implantable Pulse Generator Implant Date: 20170417
Pulse Gen Model: 2272
Pulse Gen Serial Number: 7883162

## 2019-08-12 ENCOUNTER — Encounter: Payer: Self-pay | Admitting: Physical Therapy

## 2019-08-12 ENCOUNTER — Other Ambulatory Visit: Payer: Self-pay

## 2019-08-12 ENCOUNTER — Ambulatory Visit: Payer: Medicare Other | Attending: Geriatric Medicine | Admitting: Physical Therapy

## 2019-08-12 DIAGNOSIS — R2689 Other abnormalities of gait and mobility: Secondary | ICD-10-CM | POA: Diagnosis not present

## 2019-08-12 DIAGNOSIS — R42 Dizziness and giddiness: Secondary | ICD-10-CM | POA: Diagnosis not present

## 2019-08-12 DIAGNOSIS — H8112 Benign paroxysmal vertigo, left ear: Secondary | ICD-10-CM | POA: Insufficient documentation

## 2019-08-12 NOTE — Patient Instructions (Signed)
Access Code: [email or web address]  URL: [email or web address]  Date: 07/15/2019  Prepared by: Misty Stanley   Exercises Heel Toe Raises with Counter Support - 10 reps - 2 sets - 1x daily - 7x weekly Standing March with Counter Support - 10 reps - 2 sets - 5 second hold - 1x daily - 7x weekly Standing Hip Abduction with Counter Support - 10 reps - 2 sets - 1x daily - 7x weekly Standing Hip Extension with Counter Support - 10 reps - 2 sets - 1x daily - 7x weekly Sit to Stand - 12 reps - 2 sets - 1x daily - 7x weekly Standing with Eyes Closed With Horizontal Head Turns - 10 reps - 2 sets - 1x daily - 7x weekly

## 2019-08-12 NOTE — Therapy (Addendum)
[facility] [address] [place], [place], [address] Phone: [phone]   Fax:  [phone]  Physical Therapy Treatment and Discharge Summary  Patient Details  Name: Gabrielle Hoffman MRN: [id number] Date of Birth: [birth year] Referring Provider (PT): Dr. Felipa Eth   Encounter Date: 08/12/2019  PT End of Session - 08/12/19 1535    Visit Number  5    Number of Visits  5    Date for PT Re-Evaluation  08/12/19    Authorization Type  Medicare and BCBS: progress note every 10th visit.    PT Start Time  1535    PT Stop Time  1615    PT Time Calculation (min)  40 min    Activity Tolerance  Patient tolerated treatment well    Behavior During Therapy  WFL for tasks assessed/performed       Past Medical History:  Diagnosis Date  . Asthma   . DDD (degenerative disc disease), lumbar   . Dementia    very mild   . Depression   . Disc disorder of cervical region   . Diverticulosis   . Hyperlipidemia   . Hypertension   . Hyponatremia 12/17/2011  . Kidney stones    "passed them"  . Memory loss   . Mitral valve prolapse   . Osteopenia   . Pneumonia    "I believe I had it a long time ago"  . Presence of permanent cardiac pacemaker   . Syncope   . UTI (lower urinary tract infection)   . Vaginal prolapse   . Venous insufficiency     Past Surgical History:  Procedure Laterality Date  . ABDOMINAL HYSTERECTOMY    . APPENDECTOMY    . EP IMPLANTABLE DEVICE N/A 08/27/2015   Procedure: Loop Recorder Insertion;  Surgeon: Evans Lance, MD;  Location: [facility];  Service: Cardiovascular;  Laterality: N/A;  . EP IMPLANTABLE DEVICE N/A 11/19/2015   Procedure: Pacemaker Implant;  Surgeon: Evans Lance, MD;  Location: [facility];  Service: Cardiovascular;  Laterality: N/A;  . INSERT / REPLACE / REMOVE PACEMAKER  11/19/2015  . TONSILLECTOMY      There were no vitals filed for this visit.  Subjective Assessment -  08/12/19 1536    Subjective  Pt reports no dizziness episodes since last session - no falls or near falls. No concerns at this time. Pt's son brought up recent appetite change and pt becoming fatigued when out on walks in the community - assessed vitals throughout session see note.    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    Currently in Pain?  No/denies         [facility] PT Assessment - 08/12/19 1541      Assessment   Medical Diagnosis  BPPV    Referring Provider (PT)  Dr. Felipa Eth      Standardized Balance Assessment   Standardized Balance Assessment  Dynamic Gait Index      Dynamic Gait Index   Level Surface  Mild Impairment    Change in Gait Speed  Normal    Gait with Horizontal Head Turns  Normal    Gait with Vertical Head Turns  Mild Impairment    Gait and Pivot Turn  Normal    Step Over  Obstacle  Mild Impairment    Step Around Obstacles  Normal    Steps  Moderate Impairment   alt pattern to ascend, step to to descend    Total Score  19    DGI comment:  07/08/19: 18/24                   [facility] Adult PT Treatment/Exercise - 08/12/19 1542      Ambulation/Gait   Ambulation/Gait  Yes    Ambulation/Gait Assistance  5: Supervision    Ambulation/Gait Assistance Details  Monitoring vitals throughout - remained stable SPO2% = 93-93% and HR = 102 bpm.     Ambulation Distance (Feet)  230 Feet   230'x1, gait throughout gym for DGI assessment   Assistive device  None    Gait Pattern  Step-through pattern;Decreased stride length;Shuffle;Decreased dorsiflexion - right;Decreased dorsiflexion - left    Ambulation Surface  Level;Indoor      Standardized Balance Assessment   Standardized Balance Assessment  Dynamic Gait Index         Exercises  Heel Toe Raises with Counter Support - 10 reps - 2 sets  - 1x daily - 7x weekly  Standing March with Counter Support - 10 reps - 2 sets - 5 second hold - 1x daily - 7x weekly  Standing Hip Abduction with Counter Support - 10 reps - 2 sets - 1x daily - 7x weekly  Standing Hip Extension with Counter Support - 10 reps - 2 sets - 1x daily - 7x weekly  Sit to Stand - 12 reps - 2 sets - 1x daily - 7x weekly  Standing with Eyes Closed With Horizontal Head Turns - 10 reps - 2 sets - 1x daily - 7x weekly     PT Education - 08/12/19 1625    Education Details  reviewed HEP, discussed d/c today with pt & son for future appt - agreed if dizziness remains resolved    Person(s) Educated  Patient;Child(ren)    Methods  Explanation    Comprehension  Verbalized understanding       PT Short Term Goals - 06/13/19 1648      PT SHORT TERM GOAL #1   Title  same as LTGs        PT Long Term Goals - 08/12/19 1540      PT LONG TERM GOAL #1   Title  Pt will report 0/10 (OR NONE) dizziness during all positional testing to safely perform ADLs. TARGET DATE FOR ALL LTGS: 08/12/2019 - goal date extended due to 3 week delay in start of follow up visits.    Baseline  08/12/19: no indication to perform positional testing d/t pt report of resolution of dizziness    Time  4    Period  Weeks    Status  Achieved      PT LONG TERM GOAL #2   Title  Pt will report no dizziness when going to the beauty parlor and when performing yard work to improve QOL.    Baseline  08/12/19: per pt report no dizziness    Time  4    Period  Weeks    Status  Achieved      PT LONG TERM GOAL #3   Title  Patient will improve DGI >19/24 to indicate lower fall risk.    Baseline  08/12/19: 19/24    Time  4    Period  Weeks    Status  Partially Met  Plan - 08/12/19 1535    Clinical Impression Statement  PT session focused on LTG assessment. Pt partially met/met all goals. Per pt report dizziness sx have completely resolved since initial evaluation and her DGI improved to 19/24  demonstrating improvement in balance during ambulation with visual scanning and object negotiation. Reviewed final HEP with pt and her son. Pt is safe to d/c at this time - pt and son agreed.    Personal Factors and Comorbidities  Age;Behavior Pattern;Comorbidity 3+;Transportation;Past/Current Experience    Examination-Activity Limitations  Bed Mobility;Bend;Locomotion Level;Transfers;Stand    Examination-Participation Restrictions  Church;Community Activity;Yard Work    [employer]chant [employer] officer  Evolving/Moderate complexity    Rehab Potential  Good    PT Frequency  1x / week    PT Duration  4 weeks    PT Treatment/Interventions  ADLs/Self Care Home Management;Canalith Repostioning;DME Instruction;Balance training;Therapeutic exercise;Therapeutic activities;Functional mobility training;Stair training;Gait training;Neuromuscular re-education;Cognitive remediation;Patient/family education;Vestibular    PT Next Visit Plan  continue higher level balance and gait negotiation, d/c    Consulted and Agree with Plan of Care  Patient;Family member/caregiver    Family Member Consulted  pt's son: [name]       Patient will benefit from skilled therapeutic intervention in order to improve the following deficits and impairments:  Abnormal gait, Decreased balance, Dizziness  Visit Diagnosis: BPPV (benign paroxysmal positional vertigo), left  Dizziness and giddiness  Other abnormalities of gait and mobility     Problem List Patient Active Problem List   Diagnosis Date Noted  . Intermittent complete heart block ([facility]) 11/19/2015  . Chronic systolic heart failure ([facility])   . Cognitive change 11/18/2015  . Arrhythmia 11/18/2015  . Syncope and collapse 11/18/2015  . Syncope 07/11/2015  . Low back pain 12/19/2011  . Hyponatremia 12/17/2011  . Dehydration 12/17/2011  . UTI (lower urinary tract infection) 12/17/2011  . HTN (hypertension) 12/17/2011  . Dementia ([place]) 12/17/2011  . Senile nuclear  sclerosis 05/12/2011  . Open angle with borderline findings, low risk 05/12/2011  . Dermatochalasis 05/12/2011    Juliann Pulse SPT 08/12/2019, 4:27 PM    PHYSICAL THERAPY DISCHARGE SUMMARY  Visits from Start of Care: 5  Current functional level related to goals / functional outcomes: See LTG achievement and impression statement above.   Remaining deficits: Mild balance impairments   Education / Equipment: HEP for balance  Plan: Patient agrees to discharge.  Patient goals were met. Patient is being discharged due to meeting the stated rehab goals.  ?????     Rico Junker, PT, DPT 08/17/19    2:48 PM    [facility] [address] [place], [place], [address] Phone: [phone]   Fax:  [phone]  Name: Gabrielle Hoffman MRN: [id number] Date of Birth: 03-Jan-1931

## 2019-08-19 ENCOUNTER — Ambulatory Visit: Payer: Medicare Other | Admitting: Physical Therapy

## 2019-10-14 ENCOUNTER — Ambulatory Visit: Payer: Medicare Other | Attending: Internal Medicine

## 2019-10-14 DIAGNOSIS — Z23 Encounter for immunization: Secondary | ICD-10-CM

## 2019-10-14 NOTE — Progress Notes (Signed)
   Covid-19 Vaccination Clinic  Name:  Gabrielle Hoffman    MRN: [id number] DOB: May 24, 1931  10/14/2019  Gabrielle Hoffman was observed post Covid-19 immunization for 15 minutes without incident. She was provided with Vaccine Information Sheet and instruction to access the V-Safe system.   Gabrielle Hoffman was instructed to call 911 with any severe reactions post vaccine: Marland Kitchen Difficulty breathing  . Swelling of face and throat  . A fast heartbeat  . A bad rash all over body  . Dizziness and weakness   Immunizations Administered    Name Date Hoffman VIS Date Route   Pfizer COVID-19 Vaccine 10/14/2019  4:02 PM 0.3 mL 07/15/2019 Intramuscular   Manufacturer: [place]   Lot: [id number]   [place]: [id number]

## 2019-10-26 DIAGNOSIS — H401131 Primary open-angle glaucoma, bilateral, mild stage: Secondary | ICD-10-CM | POA: Diagnosis not present

## 2019-11-08 ENCOUNTER — Ambulatory Visit (INDEPENDENT_AMBULATORY_CARE_PROVIDER_SITE_OTHER): Payer: Medicare Other | Admitting: *Deleted

## 2019-11-08 DIAGNOSIS — R55 Syncope and collapse: Secondary | ICD-10-CM | POA: Diagnosis not present

## 2019-11-08 LAB — CUP PACEART REMOTE DEVICE CHECK
Battery Remaining Longevity: 131 mo
Battery Remaining Percentage: 95.5 %
Battery Voltage: 3.01 V
Brady Statistic AP VP Percent: 1 %
Brady Statistic AP VS Percent: 1 %
Brady Statistic AS VP Percent: 1 %
Brady Statistic AS VS Percent: 98 %
Brady Statistic RA Percent Paced: 1 %
Brady Statistic RV Percent Paced: 1 %
Date Time Interrogation Session: 20210406020015
Implantable Lead Implant Date: 20170417
Implantable Lead Implant Date: 20170417
Implantable Lead Location: 753859
Implantable Lead Location: 753860
Implantable Pulse Generator Implant Date: 20170417
Lead Channel Impedance Value: 400 Ohm
Lead Channel Impedance Value: 450 Ohm
Lead Channel Pacing Threshold Amplitude: 0.5 V
Lead Channel Pacing Threshold Amplitude: 0.625 V
Lead Channel Pacing Threshold Pulse Width: 0.5 ms
Lead Channel Pacing Threshold Pulse Width: 0.5 ms
Lead Channel Sensing Intrinsic Amplitude: 1.7 mV
Lead Channel Sensing Intrinsic Amplitude: 12 mV
Lead Channel Setting Pacing Amplitude: 0.875
Lead Channel Setting Pacing Amplitude: 1.5 V
Lead Channel Setting Pacing Pulse Width: 0.5 ms
Lead Channel Setting Sensing Sensitivity: 2 mV
Pulse Gen Model: 2272
Pulse Gen Serial Number: 7883162

## 2019-11-09 ENCOUNTER — Ambulatory Visit: Payer: Medicare Other | Attending: Internal Medicine

## 2019-11-09 DIAGNOSIS — Z23 Encounter for immunization: Secondary | ICD-10-CM

## 2019-11-09 NOTE — Progress Notes (Signed)
PPM Remote  

## 2019-11-09 NOTE — Progress Notes (Signed)
   Covid-19 Vaccination Clinic  Name:  Gabrielle Hoffman    MRN: [id number] DOB: March 26, 1931  11/09/2019  Gabrielle Hoffman was observed post Covid-19 immunization for 15 minutes without incident. She was provided with Vaccine Information Sheet and instruction to access the V-Safe system.   Gabrielle Hoffman was instructed to call 911 with any severe reactions post vaccine: Marland Kitchen Difficulty breathing  . Swelling of face and throat  . A fast heartbeat  . A bad rash all over body  . Dizziness and weakness   Immunizations Administered    Name Date Dose VIS Date Route   Pfizer COVID-19 Vaccine 11/09/2019  4:15 PM 0.3 mL 07/15/2019 Intramuscular   Manufacturer: [place]   Lot: [id number]   [place]: [id number]

## 2019-11-15 DIAGNOSIS — F325 Major depressive disorder, single episode, in full remission: Secondary | ICD-10-CM | POA: Diagnosis not present

## 2019-11-15 DIAGNOSIS — Z79899 Other long term (current) drug therapy: Secondary | ICD-10-CM | POA: Diagnosis not present

## 2019-11-15 DIAGNOSIS — Z1389 Encounter for screening for other disorder: Secondary | ICD-10-CM | POA: Diagnosis not present

## 2019-11-15 DIAGNOSIS — I459 Conduction disorder, unspecified: Secondary | ICD-10-CM | POA: Diagnosis not present

## 2019-11-15 DIAGNOSIS — R350 Frequency of micturition: Secondary | ICD-10-CM | POA: Diagnosis not present

## 2019-11-15 DIAGNOSIS — R0600 Dyspnea, unspecified: Secondary | ICD-10-CM | POA: Diagnosis not present

## 2019-11-15 DIAGNOSIS — Z950 Presence of cardiac pacemaker: Secondary | ICD-10-CM | POA: Diagnosis not present

## 2019-11-15 DIAGNOSIS — G301 Alzheimer's disease with late onset: Secondary | ICD-10-CM | POA: Diagnosis not present

## 2019-11-15 DIAGNOSIS — R110 Nausea: Secondary | ICD-10-CM | POA: Diagnosis not present

## 2019-11-15 DIAGNOSIS — R946 Abnormal results of thyroid function studies: Secondary | ICD-10-CM | POA: Diagnosis not present

## 2019-11-15 DIAGNOSIS — K5901 Slow transit constipation: Secondary | ICD-10-CM | POA: Diagnosis not present

## 2019-11-15 DIAGNOSIS — F0280 Dementia in other diseases classified elsewhere without behavioral disturbance: Secondary | ICD-10-CM | POA: Diagnosis not present

## 2019-11-15 DIAGNOSIS — L989 Disorder of the skin and subcutaneous tissue, unspecified: Secondary | ICD-10-CM | POA: Diagnosis not present

## 2019-11-15 DIAGNOSIS — Z0000 Encounter for general adult medical examination without abnormal findings: Secondary | ICD-10-CM | POA: Diagnosis not present

## 2019-11-15 DIAGNOSIS — I10 Essential (primary) hypertension: Secondary | ICD-10-CM | POA: Diagnosis not present

## 2019-11-29 DIAGNOSIS — L281 Prurigo nodularis: Secondary | ICD-10-CM | POA: Diagnosis not present

## 2020-01-23 ENCOUNTER — Telehealth: Payer: Self-pay | Admitting: Internal Medicine

## 2020-01-23 NOTE — Telephone Encounter (Signed)
Pt c/o Shortness Of Breath: STAT if SOB developed within the last 24 hours or pt is noticeably SOB on the phone  1. Are you currently SOB (can you hear that pt is SOB on the phone)? Spoke to son  2. How long have you been experiencing SOB? Past few weeks.   3. Are you SOB when sitting or when up moving around? Moving around.   4. Are you currently experiencing any other symptoms? Very tired, and nauseous.

## 2020-01-23 NOTE — Telephone Encounter (Signed)
Spoke to pt, who then puts her son on the phone. He states mom is seeing her PCP tomorrow for nausea, fatigue and low thyroid but also wanted to make sure issues could not be r/t her PPM. Advised to keep appt w/ PCP to further discuss reported issues and that they were not r/t PPM.  Aware that Dr. Carlyle Lipa office will contact us if they feel pt needs to be seen sooner by cardiology.  Son appreciates my return call and speaking with him.

## 2020-02-07 ENCOUNTER — Ambulatory Visit (INDEPENDENT_AMBULATORY_CARE_PROVIDER_SITE_OTHER): Payer: Medicare Other | Admitting: *Deleted

## 2020-02-07 DIAGNOSIS — R55 Syncope and collapse: Secondary | ICD-10-CM | POA: Diagnosis not present

## 2020-02-07 LAB — CUP PACEART REMOTE DEVICE CHECK
Battery Remaining Longevity: 131 mo
Battery Remaining Percentage: 95.5 %
Battery Voltage: 3.01 V
Brady Statistic AP VP Percent: 1 %
Brady Statistic AP VS Percent: 1.3 %
Brady Statistic AS VP Percent: 1 %
Brady Statistic AS VS Percent: 98 %
Brady Statistic RA Percent Paced: 1 %
Brady Statistic RV Percent Paced: 1 %
Date Time Interrogation Session: 20210706020015
Implantable Lead Implant Date: 20170417
Implantable Lead Implant Date: 20170417
Implantable Lead Location: 753859
Implantable Lead Location: 753860
Implantable Pulse Generator Implant Date: 20170417
Lead Channel Impedance Value: 410 Ohm
Lead Channel Impedance Value: 460 Ohm
Lead Channel Pacing Threshold Amplitude: 0.5 V
Lead Channel Pacing Threshold Amplitude: 0.625 V
Lead Channel Pacing Threshold Pulse Width: 0.5 ms
Lead Channel Pacing Threshold Pulse Width: 0.5 ms
Lead Channel Sensing Intrinsic Amplitude: 1.3 mV
Lead Channel Sensing Intrinsic Amplitude: 12 mV
Lead Channel Setting Pacing Amplitude: 0.875
Lead Channel Setting Pacing Amplitude: 1.5 V
Lead Channel Setting Pacing Pulse Width: 0.5 ms
Lead Channel Setting Sensing Sensitivity: 2 mV
Pulse Gen Model: 2272
Pulse Gen Serial Number: 7883162

## 2020-02-09 NOTE — Progress Notes (Signed)
Remote pacemaker transmission.   

## 2020-02-16 DIAGNOSIS — Z5181 Encounter for therapeutic drug level monitoring: Secondary | ICD-10-CM | POA: Diagnosis not present

## 2020-02-21 ENCOUNTER — Other Ambulatory Visit: Payer: Self-pay

## 2020-02-21 ENCOUNTER — Other Ambulatory Visit: Payer: Medicare Other

## 2020-02-21 DIAGNOSIS — Z515 Encounter for palliative care: Secondary | ICD-10-CM

## 2020-02-23 NOTE — Progress Notes (Signed)
COMMUNITY PALLIATIVE CARE SW NOTE  PATIENT NAME: Gabrielle Hoffman DOB: 1930-12-31 MRN: [id number]  PRIMARY CARE PROVIDER: Lajean Manes, MD  RESPONSIBLE PARTY:  Acct ID - Guarantor Home Phone Work Phone Relationship Acct Type  [id number] - [place],BE* [phone]  Self P/F     [address], [place], [place] [address]     PLAN OF CARE and INTERVENTIONS:             1. GOALS OF CARE/ ADVANCE CARE PLANNING:  Goal is for patient to remain as independent as possible. Patient is a FULL CODE. 2. SOCIAL/EMOTIONAL/SPIRITUAL ASSESSMENT/ INTERVENTIONS:  SW completed initial visit with patient and [name] at their home. SW provided education regarding the palliative care program and services, role of the team and how to access support and visit frequency. Patient and her son provided verbal consent to ongoing visits and support by the team.  Patient was up, ambulating independently. She is alert and oriented to self and situation, but is forgetful and hard of hearing. Patient denied pain, but report intermittent pain to her back where she can not sit for a long period of time. Patient can be left alone for a short period of time. Her appetite is poor as she is having increased periods of nausea. She is having increased fatigued. Patient has lost 15 lbs in a month. Patient was born and raised in [place], [place]. She has masters degree in education. She was an Automotive engineer. She has been widowed since 2001. Gabrielle Hoffman is her only child. She is baptist by faith. She enjoyed square dancing. Her son serves as her POA/HCPOA. Patient is a FULL CODE and education was provided around code status. MOST form was also reviewed and form was left with the family for their review.  3. PATIENT/CAREGIVER EDUCATION/ COPING:  Patient is alert and oriented to self and situation. She is requiring more care and this is a concern of her son, who also suffers from his own health issues. Education was provided about respite care and  permanent placement. Her son requested additional information. Patient and her son have a strong faith background and is source of support for them.  4. PERSONAL EMERGENCY PLAN: 911 can be accessed for emergencies. SW reinforced access and support from palliative care team.  5. COMMUNITY RESOURCES COORDINATION/ HEALTH CARE NAVIGATION:  Patient receives mobile meals. She receives personal care services for 4 hours, Monday and Friday from 2 pm-4 pm. She has  follow-up appointments with Dr. Sabra Heck on 8/10 and Dr. Lovena Le on 8/31 6. FINANCIAL/LEGAL CONCERNS/INTERVENTIONS:  No financial or legal issues.      SOCIAL HX:  Social History   Tobacco Use  . Smoking status: Never Smoker  . Smokeless tobacco: Never Used  Substance Use Topics  . Alcohol use: No    CODE STATUS: FULL CODE ADVANCED DIRECTIVES: Yes MOST FORM COMPLETE:  No, but reviewed HOSPICE EDUCATION PROVIDED: Yes  PPS: Patient ambulates independently. She is alert and oriented to self and situation, but is hard of hearing and forgetful. She is independent for personal care needs.   Duration of visit and documentation: 60 minutes      Katheren Puller, LCSW

## 2020-03-02 DIAGNOSIS — E0500 Thyrotoxicosis with diffuse goiter without thyrotoxic crisis or storm: Secondary | ICD-10-CM | POA: Diagnosis not present

## 2020-03-02 DIAGNOSIS — R630 Anorexia: Secondary | ICD-10-CM | POA: Diagnosis not present

## 2020-03-02 DIAGNOSIS — E0590 Thyrotoxicosis, unspecified without thyrotoxic crisis or storm: Secondary | ICD-10-CM | POA: Diagnosis not present

## 2020-03-02 DIAGNOSIS — R634 Abnormal weight loss: Secondary | ICD-10-CM | POA: Diagnosis not present

## 2020-03-02 DIAGNOSIS — R490 Dysphonia: Secondary | ICD-10-CM | POA: Diagnosis not present

## 2020-03-02 DIAGNOSIS — R350 Frequency of micturition: Secondary | ICD-10-CM | POA: Diagnosis not present

## 2020-03-02 DIAGNOSIS — Z8379 Family history of other diseases of the digestive system: Secondary | ICD-10-CM | POA: Diagnosis not present

## 2020-03-13 DIAGNOSIS — H5203 Hypermetropia, bilateral: Secondary | ICD-10-CM | POA: Diagnosis not present

## 2020-03-13 DIAGNOSIS — H524 Presbyopia: Secondary | ICD-10-CM | POA: Diagnosis not present

## 2020-03-13 DIAGNOSIS — H52223 Regular astigmatism, bilateral: Secondary | ICD-10-CM | POA: Diagnosis not present

## 2020-03-13 DIAGNOSIS — H2513 Age-related nuclear cataract, bilateral: Secondary | ICD-10-CM | POA: Diagnosis not present

## 2020-03-13 DIAGNOSIS — H25813 Combined forms of age-related cataract, bilateral: Secondary | ICD-10-CM | POA: Diagnosis not present

## 2020-03-20 DIAGNOSIS — R110 Nausea: Secondary | ICD-10-CM | POA: Diagnosis not present

## 2020-03-20 DIAGNOSIS — I10 Essential (primary) hypertension: Secondary | ICD-10-CM | POA: Diagnosis not present

## 2020-03-20 DIAGNOSIS — N390 Urinary tract infection, site not specified: Secondary | ICD-10-CM | POA: Diagnosis not present

## 2020-03-20 DIAGNOSIS — G301 Alzheimer's disease with late onset: Secondary | ICD-10-CM | POA: Diagnosis not present

## 2020-03-20 DIAGNOSIS — E441 Mild protein-calorie malnutrition: Secondary | ICD-10-CM | POA: Diagnosis not present

## 2020-04-02 DIAGNOSIS — E0500 Thyrotoxicosis with diffuse goiter without thyrotoxic crisis or storm: Secondary | ICD-10-CM | POA: Diagnosis not present

## 2020-04-02 DIAGNOSIS — E0590 Thyrotoxicosis, unspecified without thyrotoxic crisis or storm: Secondary | ICD-10-CM | POA: Diagnosis not present

## 2020-04-02 DIAGNOSIS — R634 Abnormal weight loss: Secondary | ICD-10-CM | POA: Diagnosis not present

## 2020-04-03 ENCOUNTER — Ambulatory Visit (INDEPENDENT_AMBULATORY_CARE_PROVIDER_SITE_OTHER): Payer: Medicare Other | Admitting: Internal Medicine

## 2020-04-03 ENCOUNTER — Other Ambulatory Visit: Payer: Self-pay

## 2020-04-03 ENCOUNTER — Encounter: Payer: Self-pay | Admitting: Internal Medicine

## 2020-04-03 VITALS — BP 92/52 | HR 73 | Ht 66.0 in | Wt 104.4 lb

## 2020-04-03 DIAGNOSIS — Z950 Presence of cardiac pacemaker: Secondary | ICD-10-CM | POA: Diagnosis not present

## 2020-04-03 DIAGNOSIS — I459 Conduction disorder, unspecified: Secondary | ICD-10-CM

## 2020-04-03 DIAGNOSIS — I5022 Chronic systolic (congestive) heart failure: Secondary | ICD-10-CM | POA: Diagnosis not present

## 2020-04-03 NOTE — Patient Instructions (Addendum)
Medication Instructions:  Your physician has recommended you make the following change in your medication:   1.  STOP taking AMLODIPINE   Labwork: None ordered.  Testing/Procedures: None ordered.  Follow-Up: Your physician wants you to follow-up in: one year with Dr. Lovena Le.   You will receive a reminder letter in the mail two months in advance. If you don't receive a letter, please call our office to schedule the follow-up appointment.  Remote monitoring is used to monitor your Pacemaker from home. This monitoring reduces the number of office visits required to check your device to one time per year. It allows [place] to keep an eye on the functioning of your device to ensure it is working properly. You are scheduled for a device check from home on 05/08/2020. You may send your transmission at any time that day. If you have a wireless device, the transmission will be sent automatically. After your physician reviews your transmission, you will receive a postcard with your next transmission date.  Any Other Special Instructions Will Be Listed Below (If Applicable).  If you need a refill on your cardiac medications before your next appointment, please call your pharmacy.

## 2020-04-03 NOTE — Progress Notes (Signed)
HPI Gabrielle Hoffman returns today for followup. She is a pleasant 86 yo woman with dementia, HTN, sinus node dysfunction , s/p PPM insertion. She denies chest pain or sob. She has had some dizzy spells. No syncope.  Allergies  Allergen Reactions  . Sulfa Antibiotics Rash  . Sulfamethoxazole Rash     Current Outpatient Medications  Medication Sig Dispense Refill  . acetaminophen (TYLENOL) 325 MG tablet Take 650 mg by mouth every 6 (six) hours as needed for mild pain, moderate pain or fever.     Marland Kitchen amLODipine (NORVASC) 5 MG tablet Take 5 mg by mouth daily.    Marland Kitchen donepezil (ARICEPT) 10 MG tablet Take 10 mg by mouth daily.  6  . latanoprost (XALATAN) 0.005 % ophthalmic solution as directed.    . meclizine (ANTIVERT) 12.5 MG tablet Take 12.5 mg by mouth 2 (two) times daily as needed for dizziness.     . methimazole (TAPAZOLE) 10 MG tablet Take 10 mg by mouth daily.    . Multiple Vitamin (MULTIVITAMIN WITH MINERALS) TABS tablet Take 1 tablet by mouth daily.     No current facility-administered medications for this visit.     Past Medical History:  Diagnosis Date  . Asthma   . DDD (degenerative disc disease), lumbar   . Dementia    very mild   . Depression   . Disc disorder of cervical region   . Diverticulosis   . Hyperlipidemia   . Hypertension   . Hyponatremia 12/17/2011  . Kidney stones    "passed them"  . Memory loss   . Mitral valve prolapse   . Osteopenia   . Pneumonia    "I believe I had it a long time ago"  . Presence of permanent cardiac pacemaker   . Syncope   . UTI (lower urinary tract infection)   . Vaginal prolapse   . Venous insufficiency     ROS:   All systems reviewed and negative except as noted in the HPI.   Past Surgical History:  Procedure Laterality Date  . ABDOMINAL HYSTERECTOMY    . APPENDECTOMY    . EP IMPLANTABLE DEVICE N/A 08/27/2015   Procedure: Loop Recorder Insertion;  Surgeon: Evans Lance, MD;  Location: [facility];   Service: Cardiovascular;  Laterality: N/A;  . EP IMPLANTABLE DEVICE N/A 11/19/2015   Procedure: Pacemaker Implant;  Surgeon: Evans Lance, MD;  Location: [facility];  Service: Cardiovascular;  Laterality: N/A;  . INSERT / REPLACE / REMOVE PACEMAKER  11/19/2015  . TONSILLECTOMY       Family History  Problem Relation Age of Onset  . Cancer Sister      Social History   Socioeconomic History  . Marital status: Widowed    Spouse name: Not on file  . Number of children: Not on file  . Years of education: Not on file  . Highest education level: Not on file  Occupational History  . Not on file  Tobacco Use  . Smoking status: Never Smoker  . Smokeless tobacco: Never Used  Vaping Use  . Vaping Use: Never used  Substance and Sexual Activity  . Alcohol use: No  . Drug use: No  . Sexual activity: Not Currently    Birth control/protection: Post-menopausal  Other Topics Concern  . Not on file  Social History Narrative  . Not on file   Social Determinants of Health   Financial Resource Strain:   . Difficulty  of Paying Living Expenses: Not on file  Food Insecurity:   . Worried About Charity fundraiser in the Last Year: Not on file  . Ran Out of Food in the Last Year: Not on file  Transportation Needs:   . Lack of Transportation (Medical): Not on file  . Lack of Transportation (Non-Medical): Not on file  Physical Activity:   . Days of Exercise per Week: Not on file  . Minutes of Exercise per Session: Not on file  Stress:   . Feeling of Stress : Not on file  Social Connections:   . Frequency of Communication with Friends and Family: Not on file  . Frequency of Social Gatherings with Friends and Family: Not on file  . Attends Religious Services: Not on file  . Active Member of Clubs or Organizations: Not on file  . Attends Archivist Meetings: Not on file  . Marital Status: Not on file  Intimate Partner Violence:   . Fear of Current or Ex-Partner: Not on  file  . Emotionally Abused: Not on file  . Physically Abused: Not on file  . Sexually Abused: Not on file     BP (!) 92/52   Pulse 73   Ht 5\' 6"  (1.676 m)   Wt 104 lb 6.4 oz (47.4 kg)   SpO2 95%   BMI 16.85 kg/m   Physical Exam:  Well appearing NAD HEENT: Unremarkable Neck:  6 cm JVD, no thyromegally Lymphatics:  No adenopathy Back:  No CVA tenderness Lungs:  Clear with no wheezes HEART:  Regular rate rhythm, no murmurs, no rubs, no clicks Abd:  soft, positive bowel sounds, no organomegally, no rebound, no guarding Ext:  2 plus pulses, no edema, no cyanosis, no clubbing Skin:  No rashes no nodules Neuro:  CN II through XII intact, motor grossly intact  DEVICE  Normal device function.  See PaceArt for details.   Assess/Plan: 1. Sinus node dysfunction - she is asymptomatic, s/p PPM insertion. 2. PPM - her St. Jude DDD PM is working normally. 3. Dementia - she will continue aricept. 4. Dizziness - her bp is a little low and I have asked her to stop amlodipine.  [name],MD

## 2020-04-04 LAB — CUP PACEART INCLINIC DEVICE CHECK
Battery Remaining Longevity: 139 mo
Battery Voltage: 3.01 V
Brady Statistic RA Percent Paced: 1 %
Brady Statistic RV Percent Paced: 0.08 %
Date Time Interrogation Session: 20210831172842
Implantable Lead Implant Date: 20170417
Implantable Lead Implant Date: 20170417
Implantable Lead Location: 753859
Implantable Lead Location: 753860
Implantable Pulse Generator Implant Date: 20170417
Lead Channel Impedance Value: 425 Ohm
Lead Channel Impedance Value: 512.5 Ohm
Lead Channel Pacing Threshold Amplitude: 0.5 V
Lead Channel Pacing Threshold Amplitude: 0.625 V
Lead Channel Pacing Threshold Pulse Width: 0.5 ms
Lead Channel Pacing Threshold Pulse Width: 0.5 ms
Lead Channel Sensing Intrinsic Amplitude: 12 mV
Lead Channel Sensing Intrinsic Amplitude: 3.7 mV
Lead Channel Setting Pacing Amplitude: 0.875
Lead Channel Setting Pacing Amplitude: 1.5 V
Lead Channel Setting Pacing Pulse Width: 0.5 ms
Lead Channel Setting Sensing Sensitivity: 2 mV
Pulse Gen Model: 2272
Pulse Gen Serial Number: 7883162

## 2020-04-18 ENCOUNTER — Telehealth: Payer: Self-pay

## 2020-04-18 NOTE — Telephone Encounter (Signed)
Telephone call to patient to schedule palliative care visit with patient. Patient/family in agreement with home visit on 04/24/20 at 3:00PM

## 2020-04-24 ENCOUNTER — Other Ambulatory Visit: Payer: Self-pay

## 2020-04-24 ENCOUNTER — Other Ambulatory Visit: Payer: Medicare Other | Admitting: *Deleted

## 2020-04-24 ENCOUNTER — Other Ambulatory Visit: Payer: Medicare Other

## 2020-04-24 DIAGNOSIS — Z515 Encounter for palliative care: Secondary | ICD-10-CM

## 2020-04-25 ENCOUNTER — Other Ambulatory Visit: Payer: Self-pay

## 2020-04-25 NOTE — Progress Notes (Signed)
COMMUNITY PALLIATIVE CARE SW NOTE  PATIENT NAME: Gabrielle Hoffman DOB: Mar 05, 1931 MRN: [id number]  PRIMARY CARE PROVIDER: Lajean Manes, MD  RESPONSIBLE PARTY:  Acct ID - Guarantor Home Phone Work Phone Relationship Acct Type  [id number] - [place],BE* [phone]  Self P/F     [address], [place], [place] [address]     PLAN OF CARE and INTERVENTIONS:             1. GOALS OF CARE/ ADVANCE CARE PLANNING:  Goal is for patient to remain as independent as possible. Patient is a FULL CODE. 2. SOCIAL/EMOTIONAL/SPIRITUAL ASSESSMENT/ INTERVENTIONS:  SW completed initial visit with patient and [name] at their home.  Patient was sitting on a couch.  She is alert and oriented to self,  but is forgetful,  hard of hearing and her cognitive deficits seem more profound. Patient repeated the same story 3/4 time during the visit. Patient denied pain, but report intermittent pain to her back whereshe can not sit for a long period of time. Her son reported that patient is also reporting burning to her throat and chest. Her appetite is poor as she is having increased periods of nausea and her son feels that the medication is not helping. She is only eating less than 10% of a kids portion of meals. She is having increased fatigue and weakness. Patient appears thin, pale and frail in appearance. She has lost 4 lbs since her last weight. Her son reported that's clothes and undergarments are falling off of her and he is saddened to watch this as he feels helpless. [name] expressed caregiver fatigue. He has limited support from family and friends. He is also experiencing health issues. SW provided supportive counseling, normalized his feelings and provided reassurance of support. Palliative care RN joined visit via telephonic to provide education regarding patient's medications and answered questions her had regarding patient change in condition. RN to follow-up with primary care physician regarding concerns and  follow-up with patient's son. SW to provide ongoing assessment of psychosocial needs and will provide support.  3. PATIENT/CAREGIVER EDUCATION/ COPING:Patient is alert and oriented to self and appears to be more confused. Her son is experiencing fatigue. He has limited support, but is coping adequately.  4. PERSONAL EMERGENCY PLAN:  911 can be activated for emergencies.  5. COMMUNITY RESOURCES COORDINATION/ HEALTH CARE NAVIGATION:    Patient receives mobile meals. She receives personal care services for 4 hours, Monday and Friday from 2 pm-4 pm. SW provided resource for a day program.  6. FINANCIAL/LEGAL CONCERNS/INTERVENTIONS:  NONE.     SOCIAL HX:  Social History   Tobacco Use  . Smoking status: Never Smoker  . Smokeless tobacco: Never Used  Substance Use Topics  . Alcohol use: No    CODE STATUS: Patient remains a FULL CODE ADVANCED DIRECTIVES: Yes MOST FORM COMPLETE:  No HOSPICE EDUCATION PROVIDED: Yes, discussed with son  PPS: Patient ambulates independently, but slower and is having increased fatigue and weakness. She is profoundly more confused. She is requires assistance with personal care needs.   Duration of visit and documentation: 60 minutes      Katheren Puller, LCSW

## 2020-05-01 DIAGNOSIS — G301 Alzheimer's disease with late onset: Secondary | ICD-10-CM | POA: Diagnosis not present

## 2020-05-01 DIAGNOSIS — E0590 Thyrotoxicosis, unspecified without thyrotoxic crisis or storm: Secondary | ICD-10-CM | POA: Diagnosis not present

## 2020-05-01 DIAGNOSIS — R350 Frequency of micturition: Secondary | ICD-10-CM | POA: Diagnosis not present

## 2020-05-01 DIAGNOSIS — E0500 Thyrotoxicosis with diffuse goiter without thyrotoxic crisis or storm: Secondary | ICD-10-CM | POA: Diagnosis not present

## 2020-05-01 DIAGNOSIS — F0280 Dementia in other diseases classified elsewhere without behavioral disturbance: Secondary | ICD-10-CM | POA: Diagnosis not present

## 2020-05-01 DIAGNOSIS — I459 Conduction disorder, unspecified: Secondary | ICD-10-CM | POA: Diagnosis not present

## 2020-05-01 DIAGNOSIS — Z8379 Family history of other diseases of the digestive system: Secondary | ICD-10-CM | POA: Diagnosis not present

## 2020-05-01 DIAGNOSIS — I10 Essential (primary) hypertension: Secondary | ICD-10-CM | POA: Diagnosis not present

## 2020-05-01 DIAGNOSIS — Z23 Encounter for immunization: Secondary | ICD-10-CM | POA: Diagnosis not present

## 2020-05-01 DIAGNOSIS — Z950 Presence of cardiac pacemaker: Secondary | ICD-10-CM | POA: Diagnosis not present

## 2020-05-04 NOTE — Progress Notes (Signed)
COMMUNITY PALLIATIVE CARE RN NOTE  PATIENT NAME: Gabrielle Hoffman DOB: 06/11/1931 MRN: [id number]  PRIMARY CARE PROVIDER: Lajean Manes, MD  RESPONSIBLE PARTY:  Acct ID - Guarantor Home Phone Work Phone Relationship Acct Type  [id number] - [place],BE* [phone]  Self P/F     [address], [place], [place] [address]   Virtual check-in visit completed with [place], and patient's son.   PLAN OF CARE and INTERVENTION:  1. ADVANCE CARE PLANNING/GOALS OF CARE: Goal is for patient to remain in her home with son as caretaker.  2. PATIENT/CAREGIVER EDUCATION: Symptom management, medication management 3. DISEASE STATUS: Received a virtual call from [place] who was visiting patient in the home. Patient denies any issues with pain. Her son's biggest concern is patient's issues with nausea and vomiting. Son does not feel that Zofran is effective when given. She also has a poor appetite and is steadily losing weight. Last month her weight was 105.8 lbs and today is 101.4 lbs. She was started on an appetite stimulant, Marinol twice daily. Son states that he has given her 2 doses but is reluctant to continue after reading the list of side effects. I recommended that he continue the Marinol as prescribed, unless she starts having adverse side effects. She has an appointment with Dr. Felipa Eth next week on 05/01/20. He is also requesting a hospital bed for her. Advised that I will contact Dr. Carlyle Lipa office to make them aware. He is Patent attorney. Contacted and left a voicemail for PCPs CMA regarding nausea/vomiting, son's concerns regarding Marinol and request for a hospital bed. Received a call back from CMA to confirm that they have received my message and are going to reach out to patient's son directly to discuss. Will continue to monitor.   HISTORY OF PRESENT ILLNESS:  This is a 86 yo female with a h/o dementia, HTN, chronic systolic heart failure and has a pacemaker. Palliative  care team continues to follow patient and visits monthly and PRN.  CODE STATUS: Full code ADVANCED DIRECTIVES: Y MOST FORM: no PPS: 50%   (Duration of visit and documentation 45 minutes)   Daryl Eastern, RN BSN

## 2020-05-08 ENCOUNTER — Ambulatory Visit (INDEPENDENT_AMBULATORY_CARE_PROVIDER_SITE_OTHER): Payer: Medicare Other

## 2020-05-08 DIAGNOSIS — R55 Syncope and collapse: Secondary | ICD-10-CM

## 2020-05-09 LAB — CUP PACEART REMOTE DEVICE CHECK
Battery Remaining Longevity: 134 mo
Battery Remaining Percentage: 95.5 %
Battery Voltage: 2.99 V
Brady Statistic AP VP Percent: 1 %
Brady Statistic AP VS Percent: 6.3 %
Brady Statistic AS VP Percent: 1 %
Brady Statistic AS VS Percent: 92 %
Brady Statistic RA Percent Paced: 5.5 %
Brady Statistic RV Percent Paced: 1 %
Date Time Interrogation Session: 20211005020015
Implantable Lead Implant Date: 20170417
Implantable Lead Implant Date: 20170417
Implantable Lead Location: 753859
Implantable Lead Location: 753860
Implantable Pulse Generator Implant Date: 20170417
Lead Channel Impedance Value: 410 Ohm
Lead Channel Impedance Value: 440 Ohm
Lead Channel Pacing Threshold Amplitude: 0.5 V
Lead Channel Pacing Threshold Amplitude: 0.75 V
Lead Channel Pacing Threshold Pulse Width: 0.5 ms
Lead Channel Pacing Threshold Pulse Width: 0.5 ms
Lead Channel Sensing Intrinsic Amplitude: 1.3 mV
Lead Channel Sensing Intrinsic Amplitude: 12 mV
Lead Channel Setting Pacing Amplitude: 1 V
Lead Channel Setting Pacing Amplitude: 1.5 V
Lead Channel Setting Pacing Pulse Width: 0.5 ms
Lead Channel Setting Sensing Sensitivity: 2 mV
Pulse Gen Model: 2272
Pulse Gen Serial Number: 7883162

## 2020-05-11 NOTE — Progress Notes (Signed)
Remote pacemaker transmission.   

## 2020-05-24 DIAGNOSIS — Z23 Encounter for immunization: Secondary | ICD-10-CM | POA: Diagnosis not present

## 2020-05-28 ENCOUNTER — Other Ambulatory Visit: Payer: Medicare Other

## 2020-05-28 DIAGNOSIS — Z515 Encounter for palliative care: Secondary | ICD-10-CM

## 2020-05-29 ENCOUNTER — Other Ambulatory Visit: Payer: Self-pay

## 2020-05-30 NOTE — Progress Notes (Signed)
COMMUNITY PALLIATIVE CARE SW NOTE  PATIENT NAME: Gabrielle Hoffman DOB: 02-14-1931 MRN: [id number]  PRIMARY CARE PROVIDER: Lajean Manes, MD  RESPONSIBLE PARTY:  Acct ID - Guarantor Home Phone Work Phone Relationship Acct Type  [id number] - [name],BE* [phone]  Self P/F     [address], [place], [place] [address]   Due to the COVID-19, this visit was done via telephone from my office and it was initiated and consent by this patient and/or family.    PLAN OF CARE and INTERVENTIONS:             1. GOALS OF CARE/ ADVANCE CARE PLANNING:  Goals is for patient to remain in her home. Patient is a Full Code.  2. SOCIAL/EMOTIONAL/SPIRITUAL ASSESSMENT/ INTERVENTIONS:  SW completed a telephonic visit with patient's son-Gabrielle Hoffman. He reported that patient appetite remains poor despite the occassional use of a appetite stimulant. Patient remains weak and is having increased confusion and forgetfulness. Patient saw her PCP and no concerns were noted. The son requested a hospital bed, however after discussing it with the PCP, they decided against it for safety reasons. Patient continues to appear thin and frail, but son was not aware of any weight loss.  Her son is still desiring assistance with shopping for undergarments for his mother. SW advised that she will make volunteer request. He has limited support from family and friends. He is also experiencing health issues. SW provided supportive counseling, normalized his feelings and provided reassurance of support. SW to provide ongoing assessment of psychosocial needs and will provide support.  3. PATIENT/CAREGIVER EDUCATION/ COPING:  Gabrielle Hoffman continues to feel isolated and overwhelmed with patient's care and lack of support he receives. SW provided supportive presence.  4. PERSONAL EMERGENCY PLAN:  911 to be activated for emergencies. 5. COMMUNITY RESOURCES COORDINATION/ HEALTH CARE NAVIGATION:  Patient receives mobile meals. She receives personal care services for 4  hours, Monday and Friday from 2 pm-4 pm. 6. FINANCIAL/LEGAL CONCERNS/INTERVENTIONS:  None.     SOCIAL HX:  Social History   Tobacco Use  . Smoking status: Never Smoker  . Smokeless tobacco: Never Used  Substance Use Topics  . Alcohol use: No    CODE STATUS: Full COde ADVANCED DIRECTIVES: Yes MOST FORM COMPLETE:  No HOSPICE EDUCATION PROVIDED: No  PPS: Patient ambulates independently, but slower and is having increased fatigue and weakness. She is profoundly more confused. She is requires assistance with personal care needs.    Duration of telephonic visit and documentation: 30 minutes   Gabrielle Puller, LCSW

## 2020-05-31 DIAGNOSIS — N390 Urinary tract infection, site not specified: Secondary | ICD-10-CM | POA: Diagnosis not present

## 2020-06-04 ENCOUNTER — Telehealth: Payer: Self-pay

## 2020-06-04 NOTE — Telephone Encounter (Signed)
Telephone call to patient to schedule palliative care visit with patient. Patient/family in agreement with home visit on 06/06/20 @1 :30pm.

## 2020-06-06 ENCOUNTER — Other Ambulatory Visit: Payer: Self-pay

## 2020-06-06 ENCOUNTER — Other Ambulatory Visit: Payer: Medicare Other

## 2020-06-06 ENCOUNTER — Other Ambulatory Visit: Payer: Medicare Other | Admitting: *Deleted

## 2020-06-06 VITALS — BP 144/74 | HR 93 | Temp 97.9°F | Resp 18 | Ht 66.0 in | Wt 105.8 lb

## 2020-06-06 DIAGNOSIS — Z515 Encounter for palliative care: Secondary | ICD-10-CM

## 2020-06-06 NOTE — Progress Notes (Signed)
COMMUNITY PALLIATIVE CARE SW NOTE  PATIENT NAME: Gabrielle Hoffman DOB: [birth year] MRN: [id number]  PRIMARY CARE PROVIDER: Lajean Manes, MD  RESPONSIBLE PARTY:  Acct ID - Guarantor Home Phone Work Phone Relationship Acct Type  [id number] - Gabrielle Hoffman,Gabrielle Hoffman* [phone]  Self P/F     [address], [place], [place] [address]     PLAN OF CARE and INTERVENTIONS:             1. GOALS OF CARE/ ADVANCE CARE PLANNING:  Goal is for patient to remain as independent as possible. Patient is a FULL CODE. 2. SOCIAL/EMOTIONAL/SPIRITUAL ASSESSMENT/ INTERVENTIONS:  SW and RN-Gabrielle Hoffman completed a home visit with patient. She was present with [name]. Patient denied pain and stated "I feel just fine". Patient sat and talked with the team. She was responsive to simple yes/no questions, but repeated herself several times and shared stories about her past. Patient is thin in appearance and pale. Her son advised that he is waiting on results from a urine culture as he suspects she may have a UTI. Patient is urinating more frequently. Her appetite remains poor overall, eating at least 1-2 small meals a day. She has an appetite stimulant that she occasionally  Takes. Her son report increased confusion and repeating herself. Patient tires herself out easily as she is not able to walk around her neighborhood without taking several breaks. Patient has several small, flat lesions on both arms and back that her son reports she picks at often. Patient is continuing to have bouts of nausea at least 1x week at night. Patient's weight is about 105.8, which is stable. However SW observed that patient's clothing is loose on her and her son report that she is often cold and layers herself with clothing. Patient is thin throughout all extremities.SW to provide ongoing assessment of psychosocial needs and will provide support.  3. PATIENT/CAREGIVER EDUCATION/ COPING: Patient is alert and oriented to self and appears to Gabrielle Hoffman more repetitive  and confused.  Her son continues to experience  limited support, but is coping adequately.   4. PERSONAL EMERGENCY PLAN:  911 can Gabrielle Hoffman activated. 5. COMMUNITY RESOURCES COORDINATION/ HEALTH CARE NAVIGATION:  Patient receives mobile meals. She receives personal care services for 4 hours, Monday and Friday from 2 pm-4 pm.  6. FINANCIAL/LEGAL CONCERNS/INTERVENTIONS:  None.     SOCIAL HX:  Social History   Tobacco Use  . Smoking status: Never Smoker  . Smokeless tobacco: Never Used  Substance Use Topics  . Alcohol use: No    CODE STATUS: FULL CODE ADVANCED DIRECTIVES: Yes MOST FORM COMPLETE:  No. HOSPICE EDUCATION PROVIDED: No  PPS: Patient continues to ambulate independently, but slower and is having increased fatigue and weakness. She is profoundly more repetative and confused. She is requires assistance with personal care needs.   Duration of visit and documentation: 60 minutes      Gabrielle Puller, LCSW

## 2020-06-11 NOTE — Progress Notes (Signed)
COMMUNITY PALLIATIVE CARE RN NOTE  PATIENT NAME: Gabrielle Hoffman DOB: 12-17-1930 MRN: [id number]  PRIMARY CARE PROVIDER: Lajean Manes, MD  RESPONSIBLE PARTY:  Acct ID - Guarantor Home Phone Work Phone Relationship Acct Type  [id number] - Till,BE* [id number]  Self P/F     [address], [place], [place] [address]   Covid-19 Pre-screening Negative  PLAN OF CARE and INTERVENTION:  1. ADVANCE CARE PLANNING/GOALS OF CARE: Goal is for patient to remain in her home.  2. PATIENT/CAREGIVER EDUCATION: Symptom management, safe mobility, s/s of infection 3. DISEASE STATUS: Joint visit made with Palliative care SW, Monica Lonon. Met with patient and her son in their home. Patient opened and greeted [place] at the door. Her mood is pleasant. She is alert and oriented to person/place. She is intermittently confused. She is very repetitive with worsening short term memory. She is able to answer most questions and make her needs known. She denies pain at this time. She is ambulatory without the use of assistive devices. She is independent with ADLs. Son tries to have patient walk outside with him as much as possible, but he is noticing she is tiring more easily and has to stop and take more frequent rest periods. She has a pacemaker. Last week, she was experiencing frequent urination and increased confusion. Son took a urine sample to her doctor's office and is currently awaiting the results of the urine culture. He is noticing that she has started to pad her regular underwear with toilet tissue and he finds it in various places in the home and even hanging from patient when they are out in public places. Recommended Depend or incontinence pads to place in her underwear as it seems she is concerned with some incontinence d/t frequency. Her appetite is variable, but he says she tends to eat the same types of foods. She is eating 1-2 meals/day. Her weight has been stable and is 105.8 lbs during visit today. He continues to  take her to the hair salon and for manicures. She has hired caregivers through [place] who comes on Mondays and Fridays for 2 hours. She has some small scabs noted to both arms and her upper back. Son states that patient has an extreme picking issue and he feels that she picks with her skin so much that she causes these areas to open then scab. She has gone to the Dermatologist in the past, but she has not been using the ointment daily as prescribed. Recommended that they start back using the cream and keep her skin moisturized to help with itching. Will continue to monitor.  HISTORY OF PRESENT ILLNESS: This is a 86 yo female with a h/o dementia, HTN, chronic systolic heart failure and has a pacemaker. Palliative care team continues to follow patient and visits monthly and PRN.   CODE STATUS: Full code  ADVANCED DIRECTIVES: Y MOST FORM: no PPS: 50%   PHYSICAL EXAM:   VITALS: Today's Vitals   06/06/20 1346  BP: (!) 144/74  Pulse: 93  Resp: 18  Temp: 97.9 F (36.6 C)  TempSrc: Temporal  SpO2: 96%  Weight: 105 lb 12.8 oz (48 kg)  Height: '5\' 6"'  (1.676 m)  PainSc: 0-No pain    LUNGS: clear to auscultation  CARDIAC: Cor Irreg, has pacemaker EXTREMITIES: No edema SKIN: Skin pale in appearance; multiple small scabs noted to arms/upper back/chest along bra straps  NEURO: Alert and oriented x 2 (person/place), HOH, generalized weakness, ambulatory   (Duration of visit and  documentation 75 minutes)   Daryl Eastern, RN BSN

## 2020-06-23 ENCOUNTER — Emergency Department (HOSPITAL_COMMUNITY): Payer: Medicare Other

## 2020-06-23 ENCOUNTER — Inpatient Hospital Stay (HOSPITAL_COMMUNITY)
Admission: EM | Admit: 2020-06-23 | Discharge: 2020-06-30 | DRG: 536 | Disposition: A | Discharge status: Skilled Nursing Facility | Payer: Medicare Other | Attending: Internal Medicine | Admitting: Internal Medicine

## 2020-06-23 ENCOUNTER — Observation Stay (HOSPITAL_COMMUNITY): Payer: Medicare Other

## 2020-06-23 DIAGNOSIS — I5022 Chronic systolic (congestive) heart failure: Secondary | ICD-10-CM | POA: Diagnosis not present

## 2020-06-23 DIAGNOSIS — D649 Anemia, unspecified: Secondary | ICD-10-CM | POA: Diagnosis present

## 2020-06-23 DIAGNOSIS — I872 Venous insufficiency (chronic) (peripheral): Secondary | ICD-10-CM | POA: Diagnosis present

## 2020-06-23 DIAGNOSIS — S32000A Wedge compression fracture of unspecified lumbar vertebra, initial encounter for closed fracture: Secondary | ICD-10-CM

## 2020-06-23 DIAGNOSIS — S32009A Unspecified fracture of unspecified lumbar vertebra, initial encounter for closed fracture: Secondary | ICD-10-CM

## 2020-06-23 DIAGNOSIS — E860 Dehydration: Secondary | ICD-10-CM | POA: Diagnosis present

## 2020-06-23 DIAGNOSIS — I10 Essential (primary) hypertension: Secondary | ICD-10-CM | POA: Diagnosis not present

## 2020-06-23 DIAGNOSIS — Z79899 Other long term (current) drug therapy: Secondary | ICD-10-CM

## 2020-06-23 DIAGNOSIS — M25551 Pain in right hip: Secondary | ICD-10-CM | POA: Diagnosis not present

## 2020-06-23 DIAGNOSIS — T40605A Adverse effect of unspecified narcotics, initial encounter: Secondary | ICD-10-CM | POA: Diagnosis not present

## 2020-06-23 DIAGNOSIS — K5903 Drug induced constipation: Secondary | ICD-10-CM | POA: Diagnosis present

## 2020-06-23 DIAGNOSIS — S22080A Wedge compression fracture of T11-T12 vertebra, initial encounter for closed fracture: Secondary | ICD-10-CM | POA: Diagnosis not present

## 2020-06-23 DIAGNOSIS — Z882 Allergy status to sulfonamides status: Secondary | ICD-10-CM

## 2020-06-23 DIAGNOSIS — Z20822 Contact with and (suspected) exposure to covid-19: Secondary | ICD-10-CM | POA: Diagnosis not present

## 2020-06-23 DIAGNOSIS — Z043 Encounter for examination and observation following other accident: Secondary | ICD-10-CM | POA: Diagnosis not present

## 2020-06-23 DIAGNOSIS — Y92009 Unspecified place in unspecified non-institutional (private) residence as the place of occurrence of the external cause: Secondary | ICD-10-CM

## 2020-06-23 DIAGNOSIS — E785 Hyperlipidemia, unspecified: Secondary | ICD-10-CM | POA: Diagnosis present

## 2020-06-23 DIAGNOSIS — Z23 Encounter for immunization: Secondary | ICD-10-CM

## 2020-06-23 DIAGNOSIS — W010XXA Fall on same level from slipping, tripping and stumbling without subsequent striking against object, initial encounter: Secondary | ICD-10-CM | POA: Diagnosis present

## 2020-06-23 DIAGNOSIS — M4854XA Collapsed vertebra, not elsewhere classified, thoracic region, initial encounter for fracture: Secondary | ICD-10-CM | POA: Diagnosis present

## 2020-06-23 DIAGNOSIS — S329XXA Fracture of unspecified parts of lumbosacral spine and pelvis, initial encounter for closed fracture: Secondary | ICD-10-CM | POA: Diagnosis present

## 2020-06-23 DIAGNOSIS — R627 Adult failure to thrive: Secondary | ICD-10-CM | POA: Diagnosis present

## 2020-06-23 DIAGNOSIS — I442 Atrioventricular block, complete: Secondary | ICD-10-CM | POA: Diagnosis present

## 2020-06-23 DIAGNOSIS — R634 Abnormal weight loss: Secondary | ICD-10-CM

## 2020-06-23 DIAGNOSIS — K224 Dyskinesia of esophagus: Secondary | ICD-10-CM | POA: Diagnosis present

## 2020-06-23 DIAGNOSIS — F0390 Unspecified dementia without behavioral disturbance: Secondary | ICD-10-CM | POA: Diagnosis present

## 2020-06-23 DIAGNOSIS — S32591A Other specified fracture of right pubis, initial encounter for closed fracture: Principal | ICD-10-CM

## 2020-06-23 DIAGNOSIS — M545 Low back pain, unspecified: Secondary | ICD-10-CM | POA: Diagnosis not present

## 2020-06-23 DIAGNOSIS — E0590 Thyrotoxicosis, unspecified without thyrotoxic crisis or storm: Secondary | ICD-10-CM | POA: Diagnosis present

## 2020-06-23 DIAGNOSIS — J90 Pleural effusion, not elsewhere classified: Secondary | ICD-10-CM | POA: Diagnosis not present

## 2020-06-23 DIAGNOSIS — J189 Pneumonia, unspecified organism: Secondary | ICD-10-CM

## 2020-06-23 DIAGNOSIS — W19XXXA Unspecified fall, initial encounter: Secondary | ICD-10-CM

## 2020-06-23 DIAGNOSIS — F32A Depression, unspecified: Secondary | ICD-10-CM | POA: Diagnosis present

## 2020-06-23 DIAGNOSIS — Z90710 Acquired absence of both cervix and uterus: Secondary | ICD-10-CM

## 2020-06-23 DIAGNOSIS — S32501A Unspecified fracture of right pubis, initial encounter for closed fracture: Secondary | ICD-10-CM | POA: Diagnosis not present

## 2020-06-23 DIAGNOSIS — I447 Left bundle-branch block, unspecified: Secondary | ICD-10-CM | POA: Diagnosis present

## 2020-06-23 DIAGNOSIS — Z950 Presence of cardiac pacemaker: Secondary | ICD-10-CM | POA: Diagnosis present

## 2020-06-23 DIAGNOSIS — R911 Solitary pulmonary nodule: Secondary | ICD-10-CM

## 2020-06-23 DIAGNOSIS — R110 Nausea: Secondary | ICD-10-CM

## 2020-06-23 DIAGNOSIS — I110 Hypertensive heart disease with heart failure: Secondary | ICD-10-CM | POA: Diagnosis present

## 2020-06-23 DIAGNOSIS — R918 Other nonspecific abnormal finding of lung field: Secondary | ICD-10-CM | POA: Diagnosis present

## 2020-06-23 LAB — CBC WITH DIFFERENTIAL/PLATELET
Abs Immature Granulocytes: 0.07 10*3/uL (ref 0.00–0.07)
Basophils Absolute: 0.1 10*3/uL (ref 0.0–0.1)
Basophils Relative: 1 %
Eosinophils Absolute: 0.3 10*3/uL (ref 0.0–0.5)
Eosinophils Relative: 3 %
HCT: 38.8 % (ref 36.0–46.0)
Hemoglobin: 12.4 g/dL (ref 12.0–15.0)
Immature Granulocytes: 1 %
Lymphocytes Relative: 10 %
Lymphs Abs: 1 10*3/uL (ref 0.7–4.0)
MCH: 30.4 pg (ref 26.0–34.0)
MCHC: 32 g/dL (ref 30.0–36.0)
MCV: 95.1 fL (ref 80.0–100.0)
Monocytes Absolute: 1 10*3/uL (ref 0.1–1.0)
Monocytes Relative: 10 %
Neutro Abs: 7.4 10*3/uL (ref 1.7–7.7)
Neutrophils Relative %: 75 %
Platelets: 237 10*3/uL (ref 150–400)
RBC: 4.08 MIL/uL (ref 3.87–5.11)
RDW: 14.6 % (ref 11.5–15.5)
WBC: 9.7 10*3/uL (ref 4.0–10.5)
nRBC: 0 % (ref 0.0–0.2)

## 2020-06-23 LAB — COMPREHENSIVE METABOLIC PANEL
ALT: 21 U/L (ref 0–44)
AST: 28 U/L (ref 15–41)
Albumin: 4 g/dL (ref 3.5–5.0)
Alkaline Phosphatase: 85 U/L (ref 38–126)
Anion gap: 15 (ref 5–15)
BUN: 8 mg/dL (ref 8–23)
CO2: 20 mmol/L — ABNORMAL LOW (ref 22–32)
Calcium: 9.4 mg/dL (ref 8.9–10.3)
Chloride: 101 mmol/L (ref 98–111)
Creatinine, Ser: 0.72 mg/dL (ref 0.44–1.00)
GFR, Estimated: >60 mL/min (ref >60)
Glucose, Bld: 112 mg/dL — ABNORMAL HIGH (ref 70–99)
Potassium: 4.5 mmol/L (ref 3.5–5.1)
Sodium: 136 mmol/L (ref 135–145)
Total Bilirubin: 1.1 mg/dL (ref 0.3–1.2)
Total Protein: 7.3 g/dL (ref 6.5–8.1)

## 2020-06-23 LAB — TROPONIN I (HIGH SENSITIVITY)
Troponin I (High Sensitivity): 12 ng/L (ref <18)
Troponin I (High Sensitivity): 15 ng/L (ref <18)

## 2020-06-23 LAB — RESPIRATORY PANEL BY RT PCR (FLU A&B, COVID)
Influenza A by PCR: NEGATIVE
Influenza B by PCR: NEGATIVE
SARS Coronavirus 2 by RT PCR: NEGATIVE

## 2020-06-23 LAB — CK: Total CK: 104 U/L (ref 38–234)

## 2020-06-23 MED ORDER — ONDANSETRON HCL 4 MG/2ML IJ SOLN
4.0000 mg | Freq: Four times a day (QID) | INTRAMUSCULAR | Status: DC | PRN
  Administered 2020-06-24 – 2020-06-26 (×4): 4 mg via INTRAVENOUS
  Filled 2020-06-23 (×4): qty 2

## 2020-06-23 MED ORDER — METHIMAZOLE 10 MG PO TABS
10.0000 mg | ORAL_TABLET | Freq: Every day | ORAL | Status: DC
  Administered 2020-06-24 – 2020-06-30 (×7): 10 mg via ORAL
  Filled 2020-06-23 (×7): qty 1

## 2020-06-23 MED ORDER — HYDROCODONE-ACETAMINOPHEN 5-325 MG PO TABS
1.0000 | ORAL_TABLET | Freq: Four times a day (QID) | ORAL | Status: DC | PRN
  Administered 2020-06-23 – 2020-06-26 (×7): 1 via ORAL
  Filled 2020-06-23 (×7): qty 1

## 2020-06-23 MED ORDER — ENOXAPARIN SODIUM 40 MG/0.4ML ~~LOC~~ SOLN
40.0000 mg | SUBCUTANEOUS | Status: DC
  Administered 2020-06-24 – 2020-06-29 (×6): 40 mg via SUBCUTANEOUS
  Filled 2020-06-23 (×7): qty 0.4

## 2020-06-23 MED ORDER — ACETAMINOPHEN 650 MG RE SUPP
650.0000 mg | Freq: Four times a day (QID) | RECTAL | Status: DC | PRN
  Filled 2020-06-23: qty 1

## 2020-06-23 MED ORDER — DONEPEZIL HCL 10 MG PO TABS: 10.0000 mg | ORAL_TABLET | Freq: Every day | ORAL | Status: DC

## 2020-06-23 MED ORDER — MORPHINE SULFATE (PF) 4 MG/ML IV SOLN
4.0000 mg | Freq: Once | INTRAVENOUS | Status: AC
  Administered 2020-06-23: 4 mg via INTRAVENOUS
  Filled 2020-06-23: qty 1

## 2020-06-23 MED ORDER — SODIUM CHLORIDE 0.9% FLUSH
3.0000 mL | Freq: Two times a day (BID) | INTRAVENOUS | Status: DC
  Administered 2020-06-23 – 2020-06-30 (×13): 3 mL via INTRAVENOUS

## 2020-06-23 MED ORDER — ACETAMINOPHEN 325 MG PO TABS
650.0000 mg | ORAL_TABLET | Freq: Four times a day (QID) | ORAL | Status: DC | PRN
  Administered 2020-06-25: 650 mg via ORAL
  Filled 2020-06-23: qty 2

## 2020-06-23 NOTE — H&P (Signed)
History and Physical   Gabrielle Hoffman OVZ:[id number] DOB: July 07, 1931 DOA: 06/23/2020  PCP: Lajean Manes, MD   Patient coming from: Home  Chief Complaint: Fall, pain  HPI: Gabrielle Hoffman is a 86 y.o. female with medical history significant of intermittent complete heart block status post pacemaker, low back pain, hypertension, dementia, systolic heart failure who presents following a fall at home.  Due to patient's dementia history obtained with the assistance of the patient's son at bedside and chart review.  Patient slipped and fell in her kitchen yesterday and landed on her right buttock and lower back area.  Per her son, she was not able to walk since the fall and he has had to put her in a wheelchair to get around.  She has significant pain if trying to move with pain located in her right hip and back.  She did not lose consciousness nor hit her head when she fell.  She has chronic intermittent nausea.  She denies and her son denies her reporting fever, cough, chest pain, shortness of breath, abdominal pain, constipation, diarrhea.  ED Course: Vital signs in ED significant for systolic blood pressure 412I to 170s.  Lab work showed bicarb of 20 otherwise CMP within normal limits.  CBC within normal notes.  Troponin negative with repeat pending.  CK within normal limits.  Respiratory panel for flu and Covid pending.  Chest x-ray was without acute abnormality.  Hip x-ray showed right pubic ramus fractures, lumbar spine x-ray showed T12 and L3 fractures.  CT of the lumbar spine ordered but not yet performed.  Patient received morphine for pain.  Orthopedic surgery and neurosurgery consulted in the ED.  Review of Systems: As per HPI otherwise all other systems reviewed and are negative.  Past Medical History:  Diagnosis Date  . Asthma   . DDD (degenerative disc disease), lumbar   . Dementia    very mild   . Depression   . Disc disorder of cervical region   . Diverticulosis   .  Hyperlipidemia   . Hypertension   . Hyponatremia 12/17/2011  . Kidney stones    "passed them"  . Memory loss   . Mitral valve prolapse   . Osteopenia   . Pneumonia    "I believe I had it a long time ago"  . Presence of permanent cardiac pacemaker   . Syncope   . UTI (lower urinary tract infection)   . Vaginal prolapse   . Venous insufficiency     Past Surgical History:  Procedure Laterality Date  . ABDOMINAL HYSTERECTOMY    . APPENDECTOMY    . EP IMPLANTABLE DEVICE N/A 08/27/2015   Procedure: Loop Recorder Insertion;  Surgeon: Evans Lance, MD;  Location: [facility];  Service: Cardiovascular;  Laterality: N/A;  . EP IMPLANTABLE DEVICE N/A 11/19/2015   Procedure: Pacemaker Implant;  Surgeon: Evans Lance, MD;  Location: [facility];  Service: Cardiovascular;  Laterality: N/A;  . INSERT / REPLACE / REMOVE PACEMAKER  11/19/2015  . TONSILLECTOMY      Social History  reports that she has never smoked. She has never used smokeless tobacco. She reports that she does not drink alcohol and does not use drugs.  Allergies  Allergen Reactions  . Sulfa Antibiotics Rash  . Sulfamethoxazole Rash    Family History  Problem Relation Age of Onset  . Cancer Sister   Reviewed on admission  Prior to Admission medications   Medication Sig Start  Date End Date Taking? Authorizing Provider  acetaminophen (TYLENOL) 325 MG tablet Take 650 mg by mouth every 6 (six) hours as needed for mild pain, moderate pain or fever.     [provider]  donepezil (ARICEPT) 10 MG tablet Take 10 mg by mouth daily. 10/26/17   [provider]  latanoprost (XALATAN) 0.005 % ophthalmic solution as directed. 11/10/17   [provider]  meclizine (ANTIVERT) 12.5 MG tablet Take 12.5 mg by mouth 2 (two) times daily as needed for dizziness.  09/07/15   [provider]  methimazole (TAPAZOLE) 10 MG tablet Take 10 mg by mouth daily. 03/14/20   [provider]    Multiple Vitamin (MULTIVITAMIN WITH MINERALS) TABS tablet Take 1 tablet by mouth daily.    [provider]    Physical Exam: Vitals:   06/23/20 1816 06/23/20 1830 06/23/20 1900 06/23/20 2000  BP: (!) 153/60 (!) 147/60 (!) 151/64 (!) 151/88  Pulse: 87 86 89 89  Resp: (!) 21 17 13 15   Temp:      TempSrc:      SpO2: 95% 94% 94% 98%  Weight:      Height:        Physical Exam Constitutional:      General: She is not in acute distress.    Appearance: Normal appearance.     Comments: Thin, elderly female pleasantly demented  HENT:     Head: Normocephalic and atraumatic.     Mouth/Throat:     Mouth: Mucous membranes are moist.     Pharynx: Oropharynx is clear.  Eyes:     Extraocular Movements: Extraocular movements intact.     Pupils: Pupils are equal, round, and reactive to light.  Cardiovascular:     Rate and Rhythm: Normal rate and regular rhythm.     Pulses: Normal pulses.     Heart sounds: Normal heart sounds.  Pulmonary:     Effort: Pulmonary effort is normal. No respiratory distress.     Breath sounds: Normal breath sounds.  Abdominal:     General: Bowel sounds are normal. There is no distension.     Palpations: Abdomen is soft.     Tenderness: There is no abdominal tenderness.  Musculoskeletal:        General: No swelling or deformity.     Comments: Right hip and back pain Neurovascularly intact bilateral lower extremities  Skin:    General: Skin is warm and dry.  Neurological:     General: No focal deficit present.     Mental Status: Mental status is at baseline.    Labs on Admission: I have personally reviewed following labs and imaging studies  CBC: Recent Labs  Lab 06/23/20 1715  WBC 9.7  NEUTROABS 7.4  HGB 12.4  HCT 38.8  MCV 95.1  PLT 528    Basic Metabolic Panel: Recent Labs  Lab 06/23/20 1715  NA 136  K 4.5  CL 101  CO2 20*  GLUCOSE 112*  BUN 8  CREATININE 0.72  CALCIUM 9.4    GFR: Estimated Creatinine Clearance: 37.5  mL/min (by C-G formula based on SCr of 0.72 mg/dL).  Liver Function Tests: Recent Labs  Lab 06/23/20 1715  AST 28  ALT 21  ALKPHOS 85  BILITOT 1.1  PROT 7.3  ALBUMIN 4.0    Urine analysis:    Component Value Date/Time   COLORURINE YELLOW 11/18/2015 1849   APPEARANCEUR CLEAR 11/18/2015 1849   LABSPEC 1.008 11/18/2015 1849   PHURINE 7.0  11/18/2015 [place] 11/18/2015 [place] 11/18/2015 [place] 11/18/2015 [place] 11/18/2015 [place] 11/18/2015 1849   UROBILINOGEN 0.2 06/17/2015 2338   NITRITE POSITIVE (A) 11/18/2015 1849   [place] (A) 11/18/2015 1849    Radiological Exams on Admission: DG Chest 1 View  Result Date: 06/23/2020 CLINICAL DATA:  Fall EXAM: CHEST  1 VIEW COMPARISON:  11/20/2015 FINDINGS: Left-sided pacing device as before. No focal opacity or pleural effusion. Probable calcified granuloma in the right infrahilar lung. Normal cardiomediastinal silhouette with aortic atherosclerosis. No pneumothorax. IMPRESSION: No active disease. Electronically Signed   By: Donavan Foil M.D.   On: 06/23/2020 18:21   DG Lumbar Spine Complete  Result Date: 06/23/2020 CLINICAL DATA:  Fall EXAM: LUMBAR SPINE - COMPLETE 4+ VIEW COMPARISON:  MRI 12/27/2011 FINDINGS: Dextroscoliosis of the lumbar spine. Probable moderate acute superior endplate fracture at M01. Possible mild endplate deformity at L3. Aortic atherosclerosis. Moderate degenerative changes at L3-L4 and L5-S1. IMPRESSION: 1. Probable moderate acute superior endplate fracture at U27. Possible mild superior endplate deformity at L3. 2. Dextroscoliosis of the lumbar spine with degenerative changes. Electronically Signed   By: Donavan Foil M.D.   On: 06/23/2020 18:20   DG Hip Unilat W or Wo Pelvis 2-3 Views Right  Result Date: 06/23/2020 CLINICAL DATA:  Fall and pain EXAM: DG HIP (WITH OR WITHOUT PELVIS) 2-3V RIGHT COMPARISON:   None. FINDINGS: There is question of cortical step-off seen at the superior right pubic rami and inferior pubic rami which could represent nondisplaced fractures. Moderate right hip osteoarthritis is seen with superior joint space loss and marginal osteophyte formation. No acute osseous abnormality. IMPRESSION: Possible nondisplaced superior right and inferior right pubic rami fractures. If further evaluation is required would recommend cross-sectional imaging. Electronically Signed   By: Prudencio Pair M.D.   On: 06/23/2020 18:23   EKG: Independently reviewed.  Sinus rhythm, left bundle branch block.  Assessment/Plan Principal Problem:   Fracture of multiple pubic rami, right, closed, initial encounter ([place]) Active Problems:   HTN (hypertension)   Dementia ([place])   Chronic systolic heart failure (H[facility])   Intermittent complete heart block ([place])   Fall at home, initial encounter   Fracture of lumbar spine ([place])  Mechanical fall Fracture of multiple pubic rami on right T12 and L3 fracture > X-rays with 2 right pubic rami fractures and T12 and L3 fracture > CT lumbar spine ordered to further characterize fractures > Orthopedics consulted in ED, nonoperative management > Neurosurgery consulted in ED, will see the patient > Unable to bear weight on admission - Bedrest for now pending neurosurgery evaluation - Pain control with as needed Norco - PT OT evaluation  Hypertension > Not currently on any antihypertensives, BP 253G and 644 systolic in ED. > Does have a history of syncope, which may explain lack of antihypertensives  Dementia > Known history of dementia, not currently on any medications.  Complete heart block status post pacemaker > Sinus with left bundle branch block on admission EKG.  No pacer spikes noted.  Systolic heart failure  > Last echo in 2016 with EF 45-50% > Not currently on any diuretics, nor other heart failure medications, no volume overload > Unclear if  continues to have systolic heart failure, but certainly not symptomatic  Thyroid disease - Continue methimazole  DVT prophylaxis: Lovenox Code Status:   Full Family Communication:  Discussed with son, [name], at bedside  Disposition Plan:   Patient is from:  Home  Anticipated DC to:  SNF versus home with home health  Anticipated DC date:  Pending clinical course  Anticipated DC barriers: None  Consults called:  Orthopedic surgery and neurosurgery consulted by EDP  Admission status:  Observation, MedSurg   Severity of Illness: The appropriate patient status for this patient is OBSERVATION. Observation status is judged to be reasonable and necessary in order to provide the required intensity of service to ensure the patient's safety. The patient's presenting symptoms, physical exam findings, and initial radiographic and laboratory data in the context of their medical condition is felt to place them at decreased risk for further clinical deterioration. Furthermore, it is anticipated that the patient will be medically stable for discharge from the hospital within 2 midnights of admission. The following factors support the patient status of observation.   " The patient's presenting symptoms include hip and back pain. " The physical exam findings include hip and back pain. " The initial radiographic and laboratory data are consistent with right pubic rami fracture and T-spine/L-spine fracture.   Marcelyn Bruins MD [facility]  How to contact the [facility] Attending or Consulting provider [place] or covering provider during after hours [place], for this patient?   1. Check the care team in [facility] and look for a) attending/consulting [facility] provider listed and b) the [facility] team listed 2. Log into [email or web address] and use 's universal password to access. If you do not have the password, please contact the hospital operator. 3. Locate the [facility] provider you are looking for under [facility] and page to a number that you can be directly reached. 4. If you still have difficulty reaching the provider, please page the [facility] (Director on Call) for the Hospitalists listed on amion for assistance.  06/23/2020, 8:41 PM

## 2020-06-23 NOTE — ED Provider Notes (Signed)
[facility] EMERGENCY DEPARTMENT Provider Note   CSN: [id number] Arrival date & time: 06/23/20  1650     History Chief Complaint  Patient presents with  . Fall    Yesterday    Gabrielle Hoffman is a 86 y.o. female history of hyperlipidemia, hypertension, dementia here presenting with fall. Patient apparently stepped and fell in the kitchen yesterday. She landed directly on her right buttock and back area. Patient was unable to walk afterwards. Had no head injury. Has no loss of consciousness and denies syncope. Patient was noted to be hypertensive with blood pressure 211/89. Patient has a history of hypertension denies any chest pain or shortness of breath or passing out.   The history is provided by the patient.       Past Medical History:  Diagnosis Date  . Asthma   . DDD (degenerative disc disease), lumbar   . Dementia    very mild   . Depression   . Disc disorder of cervical region   . Diverticulosis   . Hyperlipidemia   . Hypertension   . Hyponatremia 12/17/2011  . Kidney stones    "passed them"  . Memory loss   . Mitral valve prolapse   . Osteopenia   . Pneumonia    "I believe I had it a long time ago"  . Presence of permanent cardiac pacemaker   . Syncope   . UTI (lower urinary tract infection)   . Vaginal prolapse   . Venous insufficiency     Patient Active Problem List   Diagnosis Date Noted  . Pacemaker 04/03/2020  . Intermittent complete heart block ([place]) 11/19/2015  . Chronic systolic heart failure ([place])   . Cognitive change 11/18/2015  . Arrhythmia 11/18/2015  . Syncope and collapse 11/18/2015  . Syncope 07/11/2015  . Low back pain 12/19/2011  . Hyponatremia 12/17/2011  . Dehydration 12/17/2011  . UTI (lower urinary tract infection) 12/17/2011  . HTN (hypertension) 12/17/2011  . Dementia ([place]) 12/17/2011  . 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/12/2011  . Open angle with borderline findings, low risk 05/12/2011  . Dermatochalasis  05/12/2011    Past Surgical History:  Procedure Laterality Date  . ABDOMINAL HYSTERECTOMY    . APPENDECTOMY    . EP IMPLANTABLE DEVICE N/A 08/27/2015   Procedure: Loop Recorder Insertion;  Surgeon: Evans Lance, MD;  Location: [facility];  Service: Cardiovascular;  Laterality: N/A;  . EP IMPLANTABLE DEVICE N/A 11/19/2015   Procedure: Pacemaker Implant;  Surgeon: Evans Lance, MD;  Location: [facility];  Service: Cardiovascular;  Laterality: N/A;  . INSERT / REPLACE / REMOVE PACEMAKER  11/19/2015  . TONSILLECTOMY       OB History   No obstetric history on file.     Family History  Problem Relation Age of Onset  . Cancer Sister     Social History   Tobacco Use  . Smoking status: Never Smoker  . Smokeless tobacco: Never Used  Vaping Use  . Vaping Use: Never used  Substance Use Topics  . Alcohol use: No  . Drug use: No    Home Medications Prior to Admission medications   Medication Sig Start Date End Date Taking? Authorizing Provider  acetaminophen (TYLENOL) 325 MG tablet Take 650 mg by mouth every 6 (six) hours as needed for mild pain, moderate pain or fever.     [provider]  donepezil (ARICEPT) 10 MG tablet Take 10 mg by mouth daily. 10/26/17  [provider]  latanoprost (XALATAN) 0.005 % ophthalmic solution as directed. 11/10/17   [provider]  meclizine (ANTIVERT) 12.5 MG tablet Take 12.5 mg by mouth 2 (two) times daily as needed for dizziness.  09/07/15   [provider]  methimazole (TAPAZOLE) 10 MG tablet Take 10 mg by mouth daily. 03/14/20   [provider]  Multiple Vitamin (MULTIVITAMIN WITH MINERALS) TABS tablet Take 1 tablet by mouth daily.    [provider]    Allergies    Sulfa antibiotics and Sulfamethoxazole  Review of Systems   Review of Systems  Musculoskeletal: Positive for back pain.       R hip pain   All other systems reviewed and are negative.   Physical  Exam Updated Vital Signs BP (!) 171/85 (BP Location: Right Arm)   Pulse (!) 101   Temp 98.3 F (36.8 C) (Oral)   Resp 20   SpO2 97%   Physical Exam Vitals and nursing note reviewed.  Constitutional:      Appearance: Normal appearance.  HENT:     Head: Normocephalic and atraumatic.     Comments: No scalp hematoma     Nose: Nose normal.     Mouth/Throat:     Mouth: Mucous membranes are moist.  Eyes:     Extraocular Movements: Extraocular movements intact.     Pupils: Pupils are equal, round, and reactive to light.  Cardiovascular:     Rate and Rhythm: Normal rate and regular rhythm.     Pulses: Normal pulses.     Heart sounds: Normal heart sounds.  Pulmonary:     Effort: Pulmonary effort is normal.     Breath sounds: Normal breath sounds.  Abdominal:     General: Abdomen is flat.     Palpations: Abdomen is soft.  Musculoskeletal:     Cervical back: Normal range of motion and neck supple.     Comments: Lower lumbar tenderness. Decreased range of motion of bilateral hips. Mild tenderness of the right hip with no obvious deformity. No other extremity trauma. No saddle anesthesia. Patient has normal reflexes.   Skin:    General: Skin is warm.     Capillary Refill: Capillary refill takes less than 2 seconds.  Neurological:     Mental Status: She is alert.     Comments: No saddle anesthesia. Patient has significant pain and moving bilateral lower extremities so difficult to assess strength in the lower extremities. Over the reflexes are intact bilaterally.  Psychiatric:        Mood and Affect: Mood normal.        Behavior: Behavior normal.     ED Results / Procedures / Treatments   Labs (all labs ordered are listed, but only abnormal results are displayed) Labs Reviewed  CBC WITH DIFFERENTIAL/PLATELET  BASIC METABOLIC PANEL  CK    EKG None  Radiology No results found.  Procedures Procedures (including critical care time)  Medications Ordered in ED Medications   morphine 4 MG/ML injection 4 mg (has no administration in time range)    ED Course  I have reviewed the triage vital signs and the nursing notes.  Pertinent labs & imaging results that were available during my care of the patient were reviewed by me and considered in my medical decision making (see chart for details).    MDM Rules/Calculators/A&P  Gabrielle Hoffman is a 86 y.o. female presenting with fall with back pain and hip pain. Consider pelvic fracture versus hip fracture versus spinal fracture. Patient appears to have a mechanical fall. Will get labs and x-rays and give pain meds.  7:43 PM Xray showed lumbar fracture and pelvic fractures. Called Dr. Reatha Armour from neurosurgery and Dr. Marlou Sa from ortho. They will see patient and recommend medicine admission for pain control and rehab. Hospitalist to admit.    Final Clinical Impression(s) / ED Diagnoses Final diagnoses:  None    Rx / DC Orders ED Discharge Orders    None       Drenda Freeze, MD 06/23/20 1944

## 2020-06-23 NOTE — Progress Notes (Signed)
Patient arrival via hospital bed to room 5N28. patient in bed resting , bed in lowest position , vitals taken,call bell within reach.

## 2020-06-23 NOTE — ED Notes (Signed)
Patient transported to X-ray 

## 2020-06-23 NOTE — ED Triage Notes (Signed)
Pt to ED via EMS from home, apparently pt fell last night , pt slipped in the kitchen, hit  the corner of the wall, and slid down. No LOC, pt did not hit head, Not on blood thinners. Orientation at baseline .Pt son trying to control pain with OTC, not effective. Pt c/o pain to lower back and hips bilat. Painful to touch, no obvious deformities.  No medications given by EMS. Last VS: 211/89, 90hr, 18 RR, 96%

## 2020-06-23 NOTE — Consult Note (Signed)
ORTHOPAEDIC CONSULTATION  REQUESTING PHYSICIAN: Marcelyn Bruins, MD  Chief Complaint: " My buttocks hurts"  HPI: Gabrielle Hoffman is a 86 y.o. female who presents with primarily right-sided buttocks pain following a fall yesterday evening where she fell onto the corner of a wall where the wall jetted out.  She impacted her back and her buttocks onto the wall and then slid down onto the floor.  She has not been able to bear weight due to pain.  She was brought to the ED today where radiographs identified spine fractures and nondisplaced right pubic rami fractures.  Past Medical History:  Diagnosis Date  . Asthma   . DDD (degenerative disc disease), lumbar   . Dementia    very mild   . Depression   . Disc disorder of cervical region   . Diverticulosis   . Hyperlipidemia   . Hypertension   . Hyponatremia 12/17/2011  . Kidney stones    "passed them"  . Memory loss   . Mitral valve prolapse   . Osteopenia   . Pneumonia    "I believe I had it a long time ago"  . Presence of permanent cardiac pacemaker   . Syncope   . UTI (lower urinary tract infection)   . Vaginal prolapse   . Venous insufficiency    Past Surgical History:  Procedure Laterality Date  . ABDOMINAL HYSTERECTOMY    . APPENDECTOMY    . EP IMPLANTABLE DEVICE N/A 08/27/2015   Procedure: Loop Recorder Insertion;  Surgeon: Evans Lance, MD;  Location: [facility];  Service: Cardiovascular;  Laterality: N/A;  . EP IMPLANTABLE DEVICE N/A 11/19/2015   Procedure: Pacemaker Implant;  Surgeon: Evans Lance, MD;  Location: [facility];  Service: Cardiovascular;  Laterality: N/A;  . INSERT / REPLACE / REMOVE PACEMAKER  11/19/2015  . TONSILLECTOMY     Social History   Socioeconomic History  . Marital status: Widowed    Spouse name: Not on file  . Number of children: Not on file  . Years of education: Not on file  . Highest education level: Not on file  Occupational History  . Not on file  Tobacco Use   . Smoking status: Never Smoker  . Smokeless tobacco: Never Used  Vaping Use  . Vaping Use: Never used  Substance and Sexual Activity  . Alcohol use: No  . Drug use: No  . Sexual activity: Not Currently    Birth control/protection: Post-menopausal  Other Topics Concern  . Not on file  Social History Narrative  . Not on file   Social Determinants of Health   Financial Resource Strain:   . Difficulty of Paying Living Expenses: Not on file  Food Insecurity:   . Worried About Charity fundraiser in the Last Year: Not on file  . Ran Out of Food in the Last Year: Not on file  Transportation Needs:   . Lack of Transportation (Medical): Not on file  . Lack of Transportation (Non-Medical): Not on file  Physical Activity:   . Days of Exercise per Week: Not on file  . Minutes of Exercise per Session: Not on file  Stress:   . Feeling of Stress : Not on file  Social Connections:   . Frequency of Communication with Friends and Family: Not on file  . Frequency of Social Gatherings with Friends and Family: Not on file  . Attends Religious Services: Not on file  . Active Member of Clubs  or Organizations: Not on file  . Attends Archivist Meetings: Not on file  . Marital Status: Not on file   Family History  Problem Relation Age of Onset  . Cancer Sister    - negative except otherwise stated in the family history section Allergies  Allergen Reactions  . Sulfa Antibiotics Rash  . Sulfamethoxazole Rash   Prior to Admission medications   Medication Sig Start Date End Date Taking? Authorizing Provider  dronabinol (MARINOL) 2.5 MG capsule Take 2.5 mg by mouth daily as needed (appetite).   Yes [provider]  ibuprofen (ADVIL) 200 MG tablet Take 200 mg by mouth every 6 (six) hours as needed for moderate pain.   Yes [provider]  methimazole (TAPAZOLE) 10 MG tablet Take 10 mg by mouth daily. 03/14/20  Yes [provider]  ondansetron (ZOFRAN-ODT) 4  MG disintegrating tablet Take 4 mg by mouth every 8 (eight) hours as needed for nausea or vomiting.  05/03/20  Yes [provider]   DG Chest 1 View  Result Date: 06/23/2020 CLINICAL DATA:  Fall EXAM: CHEST  1 VIEW COMPARISON:  11/20/2015 FINDINGS: Left-sided pacing device as before. No focal opacity or pleural effusion. Probable calcified granuloma in the right infrahilar lung. Normal cardiomediastinal silhouette with aortic atherosclerosis. No pneumothorax. IMPRESSION: No active disease. Electronically Signed   By: Donavan Foil M.D.   On: 06/23/2020 18:21   DG Lumbar Spine Complete  Result Date: 06/23/2020 CLINICAL DATA:  Fall EXAM: LUMBAR SPINE - COMPLETE 4+ VIEW COMPARISON:  MRI 12/27/2011 FINDINGS: Dextroscoliosis of the lumbar spine. Probable moderate acute superior endplate fracture at E09. Possible mild endplate deformity at L3. Aortic atherosclerosis. Moderate degenerative changes at L3-L4 and L5-S1. IMPRESSION: 1. Probable moderate acute superior endplate fracture at Q33. Possible mild superior endplate deformity at L3. 2. Dextroscoliosis of the lumbar spine with degenerative changes. Electronically Signed   By: Donavan Foil M.D.   On: 06/23/2020 18:20   DG Hip Unilat W or Wo Pelvis 2-3 Views Right  Result Date: 06/23/2020 CLINICAL DATA:  Fall and pain EXAM: DG HIP (WITH OR WITHOUT PELVIS) 2-3V RIGHT COMPARISON:  None. FINDINGS: There is question of cortical step-off seen at the superior right pubic rami and inferior pubic rami which could represent nondisplaced fractures. Moderate right hip osteoarthritis is seen with superior joint space loss and marginal osteophyte formation. No acute osseous abnormality. IMPRESSION: Possible nondisplaced superior right and inferior right pubic rami fractures. If further evaluation is required would recommend cross-sectional imaging. Electronically Signed   By: Prudencio Pair M.D.   On: 06/23/2020 18:23   - pertinent xrays, CT, MRI studies were  reviewed and independently interpreted  Positive ROS: All other systems have been reviewed and were otherwise negative with the exception of those mentioned in the HPI and as above.  Physical Exam: General: Alert, no acute distress Psychiatric: Patient is competent for consent with normal mood and affect Lymphatic: No axillary or cervical lymphadenopathy Cardiovascular: No pedal edema Respiratory: No cyanosis, no use of accessory musculature GI: No organomegaly, abdomen is soft and non-tender    Images:  @ENCIMAGES @  Labs:  Lab Results  Component Value Date   REPTSTATUS 11/24/2015 FINAL 11/18/2015   CULT NO GROWTH 5 DAYS 11/18/2015   LABORGA ESCHERICHIA COLI 06/17/2015    Lab Results  Component Value Date   ALBUMIN 4.0 06/23/2020   ALBUMIN 4.0 02/17/2012    Neurologic: Patient does not have protective sensation bilateral lower extremities.   MUSCULOSKELETAL:  Diminished distal pulses of the bilateral lower extremities.  Dorsiflexion plantarflexion intact.  No tenderness throughout the bilateral feet, ankles, tib-fib, knee, femur.  No pain with logroll bilateral hips.  No shortened or externally rotated extremity.  Pain in the right buttocks with flexion of the hip.  No pain in the groin or buttocks with left hip range of motion.  No tenderness to palpation throughout the bilateral hands, wrist, forearm, elbow, humerus.  No pain with range of motion of the elbow, shoulder, wrist, hand.  Assessment: Nondisplaced right pubic rami fractures  Plan: Patient is a 86 year old female who presents 1 day after injury with nondisplaced right pubic rami fractures identified on radiographs.  She has no distracting injuries and hip joints as well as proximal femur look without injury on x-ray.  With fracture pattern and no displacement, plan for nonoperative management.  Okay for weightbearing as tolerated with a walker from orthopedic standpoint but await neurosurgery recommendations  regarding her spinous injuries.  CT scan of lumbar spine is pending.  Admit to hospitalist team, appreciate their management.  Orthopedics will follow throughout her stay.  Thank you for the consult and the opportunity to see Gabrielle Hoffman, [place] [phone] 9:02 PM

## 2020-06-23 NOTE — Consult Note (Signed)
   Providing Compassionate, Quality Care - Together  Neurosurgery Consult  Referring physician: Dr. Trilby Drummer Reason for referral: T12, L3 compression fracture  Chief Complaint: Buttock pain  History of Present Illness: This is an 86 year old female that had a ground-level fall yesterday she has complained of buttock and back pain as well as difficulty bearing weight due to her pain.  She has significant low back pain whenever she moves her legs.  She denies bowel or bladder changes.  She denies numbness or tingling in her legs.  Work-up revealed T12, L3 compression fractures and nondisplaced right pubic rami fractures.  Medications: I have reviewed the patient's current medications. Allergies: No Known Allergies  History reviewed. No pertinent family history. Social History:  has no history on file for tobacco use, alcohol use, and drug use.  ROS: 14 point review of systems was obtained which all pertinent positives and negatives are listed in HPI above  Physical Exam:  Vital signs in last 24 hours: Temp:  [98 F (36.7 C)-98.3 F (36.8 C)] 98 F (36.7 C) (07/25 1814) Pulse Rate:  [58-128] 65 (07/26 0746) Resp:  [11-18] 14 (07/26 0217) BP: (138-182)/(65-125) 153/88 (07/26 0700) SpO2:  [91 %-98 %] 96 % (07/26 0746) PE: Awake alert oriented PERRLA EOMI Bilateral upper extremity 4/5 Bilateral lower extremity 4/5, pain limiting Nontender thoracic spine to palpation Minimally tender lumbar spine to palpation  Imaging: Lumbosacral x-rays reveal T12 and L3 superior endplate fractures CT of the lumbar spine shows no acute lumbar fracture.  T12 is not visualized. Impression/Assessment:  86 year old female with  1.  T12 compression fracture  Plan:  No acute neurosurgical intervention  -We will obtain CT T-spine since T12 was not visualized -I do not appreciate any acute fractures on the lumbar spine -PT OT eval -Pain control   Thank you for allowing me to participate in this  patient's care.  Please do not hesitate to call with questions or concerns.   Elwin Sleight, [facility] Cell: [phone]

## 2020-06-23 NOTE — ED Notes (Signed)
Pt admitted to [facility]; report called to Magda Paganini, [place].

## 2020-06-24 ENCOUNTER — Observation Stay (HOSPITAL_COMMUNITY): Payer: Medicare Other

## 2020-06-24 ENCOUNTER — Encounter (HOSPITAL_COMMUNITY): Payer: Self-pay | Admitting: Internal Medicine

## 2020-06-24 ENCOUNTER — Other Ambulatory Visit: Payer: Self-pay

## 2020-06-24 DIAGNOSIS — Z9181 History of falling: Secondary | ICD-10-CM | POA: Diagnosis not present

## 2020-06-24 DIAGNOSIS — I447 Left bundle-branch block, unspecified: Secondary | ICD-10-CM | POA: Diagnosis present

## 2020-06-24 DIAGNOSIS — M2550 Pain in unspecified joint: Secondary | ICD-10-CM | POA: Diagnosis not present

## 2020-06-24 DIAGNOSIS — M4854XA Collapsed vertebra, not elsewhere classified, thoracic region, initial encounter for fracture: Secondary | ICD-10-CM | POA: Diagnosis present

## 2020-06-24 DIAGNOSIS — R1310 Dysphagia, unspecified: Secondary | ICD-10-CM | POA: Diagnosis not present

## 2020-06-24 DIAGNOSIS — Z882 Allergy status to sulfonamides status: Secondary | ICD-10-CM | POA: Diagnosis not present

## 2020-06-24 DIAGNOSIS — R634 Abnormal weight loss: Secondary | ICD-10-CM

## 2020-06-24 DIAGNOSIS — K224 Dyskinesia of esophagus: Secondary | ICD-10-CM | POA: Diagnosis present

## 2020-06-24 DIAGNOSIS — M8580 Other specified disorders of bone density and structure, unspecified site: Secondary | ICD-10-CM | POA: Diagnosis not present

## 2020-06-24 DIAGNOSIS — E860 Dehydration: Secondary | ICD-10-CM | POA: Diagnosis present

## 2020-06-24 DIAGNOSIS — Z90710 Acquired absence of both cervix and uterus: Secondary | ICD-10-CM | POA: Diagnosis not present

## 2020-06-24 DIAGNOSIS — H2589 Other age-related cataract: Secondary | ICD-10-CM | POA: Diagnosis not present

## 2020-06-24 DIAGNOSIS — M6281 Muscle weakness (generalized): Secondary | ICD-10-CM | POA: Diagnosis not present

## 2020-06-24 DIAGNOSIS — R627 Adult failure to thrive: Secondary | ICD-10-CM | POA: Diagnosis present

## 2020-06-24 DIAGNOSIS — E0590 Thyrotoxicosis, unspecified without thyrotoxic crisis or storm: Secondary | ICD-10-CM | POA: Diagnosis present

## 2020-06-24 DIAGNOSIS — R296 Repeated falls: Secondary | ICD-10-CM | POA: Diagnosis not present

## 2020-06-24 DIAGNOSIS — E785 Hyperlipidemia, unspecified: Secondary | ICD-10-CM | POA: Diagnosis present

## 2020-06-24 DIAGNOSIS — K5903 Drug induced constipation: Secondary | ICD-10-CM | POA: Diagnosis present

## 2020-06-24 DIAGNOSIS — Z7401 Bed confinement status: Secondary | ICD-10-CM | POA: Diagnosis not present

## 2020-06-24 DIAGNOSIS — R918 Other nonspecific abnormal finding of lung field: Secondary | ICD-10-CM | POA: Diagnosis present

## 2020-06-24 DIAGNOSIS — D649 Anemia, unspecified: Secondary | ICD-10-CM | POA: Diagnosis present

## 2020-06-24 DIAGNOSIS — S32591D Other specified fracture of right pubis, subsequent encounter for fracture with routine healing: Secondary | ICD-10-CM | POA: Diagnosis not present

## 2020-06-24 DIAGNOSIS — F0390 Unspecified dementia without behavioral disturbance: Secondary | ICD-10-CM | POA: Diagnosis present

## 2020-06-24 DIAGNOSIS — J9811 Atelectasis: Secondary | ICD-10-CM | POA: Diagnosis not present

## 2020-06-24 DIAGNOSIS — R110 Nausea: Secondary | ICD-10-CM | POA: Diagnosis present

## 2020-06-24 DIAGNOSIS — S329XXA Fracture of unspecified parts of lumbosacral spine and pelvis, initial encounter for closed fracture: Secondary | ICD-10-CM | POA: Diagnosis present

## 2020-06-24 DIAGNOSIS — R911 Solitary pulmonary nodule: Secondary | ICD-10-CM

## 2020-06-24 DIAGNOSIS — M5136 Other intervertebral disc degeneration, lumbar region: Secondary | ICD-10-CM | POA: Diagnosis not present

## 2020-06-24 DIAGNOSIS — W19XXXA Unspecified fall, initial encounter: Secondary | ICD-10-CM

## 2020-06-24 DIAGNOSIS — Y92009 Unspecified place in unspecified non-institutional (private) residence as the place of occurrence of the external cause: Secondary | ICD-10-CM

## 2020-06-24 DIAGNOSIS — S32000A Wedge compression fracture of unspecified lumbar vertebra, initial encounter for closed fracture: Secondary | ICD-10-CM | POA: Diagnosis not present

## 2020-06-24 DIAGNOSIS — M4804 Spinal stenosis, thoracic region: Secondary | ICD-10-CM | POA: Diagnosis not present

## 2020-06-24 DIAGNOSIS — I110 Hypertensive heart disease with heart failure: Secondary | ICD-10-CM | POA: Diagnosis present

## 2020-06-24 DIAGNOSIS — I5022 Chronic systolic (congestive) heart failure: Secondary | ICD-10-CM | POA: Diagnosis present

## 2020-06-24 DIAGNOSIS — S32000D Wedge compression fracture of unspecified lumbar vertebra, subsequent encounter for fracture with routine healing: Secondary | ICD-10-CM | POA: Diagnosis not present

## 2020-06-24 DIAGNOSIS — M545 Low back pain, unspecified: Secondary | ICD-10-CM | POA: Diagnosis not present

## 2020-06-24 DIAGNOSIS — H40019 Open angle with borderline findings, low risk, unspecified eye: Secondary | ICD-10-CM | POA: Diagnosis not present

## 2020-06-24 DIAGNOSIS — Z20822 Contact with and (suspected) exposure to covid-19: Secondary | ICD-10-CM | POA: Diagnosis present

## 2020-06-24 DIAGNOSIS — F32A Depression, unspecified: Secondary | ICD-10-CM | POA: Diagnosis present

## 2020-06-24 DIAGNOSIS — I10 Essential (primary) hypertension: Secondary | ICD-10-CM | POA: Diagnosis not present

## 2020-06-24 DIAGNOSIS — I872 Venous insufficiency (chronic) (peripheral): Secondary | ICD-10-CM | POA: Diagnosis present

## 2020-06-24 DIAGNOSIS — I442 Atrioventricular block, complete: Secondary | ICD-10-CM | POA: Diagnosis present

## 2020-06-24 DIAGNOSIS — Z23 Encounter for immunization: Secondary | ICD-10-CM | POA: Diagnosis not present

## 2020-06-24 DIAGNOSIS — W010XXA Fall on same level from slipping, tripping and stumbling without subsequent striking against object, initial encounter: Secondary | ICD-10-CM | POA: Diagnosis present

## 2020-06-24 DIAGNOSIS — Z950 Presence of cardiac pacemaker: Secondary | ICD-10-CM | POA: Diagnosis not present

## 2020-06-24 DIAGNOSIS — J189 Pneumonia, unspecified organism: Secondary | ICD-10-CM | POA: Diagnosis not present

## 2020-06-24 DIAGNOSIS — Z79899 Other long term (current) drug therapy: Secondary | ICD-10-CM | POA: Diagnosis not present

## 2020-06-24 DIAGNOSIS — R41841 Cognitive communication deficit: Secondary | ICD-10-CM | POA: Diagnosis not present

## 2020-06-24 DIAGNOSIS — M5124 Other intervertebral disc displacement, thoracic region: Secondary | ICD-10-CM | POA: Diagnosis not present

## 2020-06-24 DIAGNOSIS — S22080A Wedge compression fracture of T11-T12 vertebra, initial encounter for closed fracture: Secondary | ICD-10-CM | POA: Diagnosis not present

## 2020-06-24 DIAGNOSIS — K5900 Constipation, unspecified: Secondary | ICD-10-CM | POA: Diagnosis not present

## 2020-06-24 DIAGNOSIS — S32591A Other specified fracture of right pubis, initial encounter for closed fracture: Secondary | ICD-10-CM | POA: Diagnosis present

## 2020-06-24 DIAGNOSIS — R5381 Other malaise: Secondary | ICD-10-CM | POA: Diagnosis not present

## 2020-06-24 DIAGNOSIS — N819 Female genital prolapse, unspecified: Secondary | ICD-10-CM | POA: Diagnosis not present

## 2020-06-24 LAB — COMPREHENSIVE METABOLIC PANEL
ALT: 16 U/L (ref 0–44)
AST: 19 U/L (ref 15–41)
Albumin: 3.4 g/dL — ABNORMAL LOW (ref 3.5–5.0)
Alkaline Phosphatase: 69 U/L (ref 38–126)
Anion gap: 9 (ref 5–15)
BUN: 15 mg/dL (ref 8–23)
CO2: 25 mmol/L (ref 22–32)
Calcium: 9 mg/dL (ref 8.9–10.3)
Chloride: 102 mmol/L (ref 98–111)
Creatinine, Ser: 0.94 mg/dL (ref 0.44–1.00)
GFR, Estimated: 59 mL/min — ABNORMAL LOW (ref >60)
Glucose, Bld: 109 mg/dL — ABNORMAL HIGH (ref 70–99)
Potassium: 4.4 mmol/L (ref 3.5–5.1)
Sodium: 136 mmol/L (ref 135–145)
Total Bilirubin: 1 mg/dL (ref 0.3–1.2)
Total Protein: 6.4 g/dL — ABNORMAL LOW (ref 6.5–8.1)

## 2020-06-24 LAB — CBC
HCT: 33.9 % — ABNORMAL LOW (ref 36.0–46.0)
Hemoglobin: 10.9 g/dL — ABNORMAL LOW (ref 12.0–15.0)
MCH: 30.1 pg (ref 26.0–34.0)
MCHC: 32.2 g/dL (ref 30.0–36.0)
MCV: 93.6 fL (ref 80.0–100.0)
Platelets: 245 10*3/uL (ref 150–400)
RBC: 3.62 MIL/uL — ABNORMAL LOW (ref 3.87–5.11)
RDW: 14.5 % (ref 11.5–15.5)
WBC: 7.5 10*3/uL (ref 4.0–10.5)
nRBC: 0 % (ref 0.0–0.2)

## 2020-06-24 MED ORDER — MORPHINE SULFATE (PF) 2 MG/ML IV SOLN: 2.0000 mg | INTRAVENOUS | Status: DC | PRN

## 2020-06-24 MED ORDER — PANTOPRAZOLE SODIUM 40 MG PO TBEC
40.0000 mg | DELAYED_RELEASE_TABLET | Freq: Every day | ORAL | Status: DC
  Administered 2020-06-24 – 2020-06-29 (×6): 40 mg via ORAL
  Filled 2020-06-24 (×6): qty 1

## 2020-06-24 MED ORDER — ENSURE ENLIVE PO LIQD
237.0000 mL | Freq: Three times a day (TID) | ORAL | Status: DC
  Administered 2020-06-25 – 2020-06-28 (×7): 237 mL via ORAL
  Filled 2020-06-24 (×5): qty 237

## 2020-06-24 MED ORDER — FAMOTIDINE 20 MG PO TABS
20.0000 mg | ORAL_TABLET | Freq: Two times a day (BID) | ORAL | Status: DC
  Administered 2020-06-24 – 2020-06-30 (×12): 20 mg via ORAL
  Filled 2020-06-24 (×12): qty 1

## 2020-06-24 MED ORDER — DRONABINOL 2.5 MG PO CAPS
2.5000 mg | ORAL_CAPSULE | Freq: Every day | ORAL | Status: DC | PRN
  Administered 2020-06-26: 2.5 mg via ORAL
  Filled 2020-06-24: qty 1

## 2020-06-24 NOTE — Plan of Care (Signed)
  Problem: Education: Goal: Knowledge of General Education information will improve Description: Including pain rating scale, medication(s)/side effects and non-pharmacologic comfort measures Outcome: Progressing   Problem: Health Behavior/Discharge Planning: Goal: Ability to manage health-related needs will improve Outcome: Progressing   Problem: Clinical Measurements: Goal: Ability to maintain clinical measurements within normal limits will improve Outcome: Progressing   Problem: Clinical Measurements: Goal: Will remain free from infection Outcome: Progressing   Problem: Activity: Goal: Risk for activity intolerance will decrease Outcome: Progressing   Problem: Nutrition: Goal: Adequate nutrition will be maintained Outcome: Progressing   Problem: Pain Managment: Goal: General experience of comfort will improve Outcome: Progressing   Problem: Safety: Goal: Ability to remain free from injury will improve Outcome: Progressing

## 2020-06-24 NOTE — Plan of Care (Signed)

## 2020-06-24 NOTE — Progress Notes (Signed)
Patient more comfortable this morning, complaining of minimal back pain  Able to lift her legs off the bed with minimal back pain No tenderness to palpation in the thoracolumbar spine and midline whatsoever   CT showed T12 superior endplate fracture, no significant retropulsion or malalignment.  No bracing needed.  Patient can follow-up in 2 to 3 weeks as an outpatient, as needed  PT/OT eval and mobilization recommended  We will sign off at this time.   Thank you for allowing me to participate in this patient's care.  Please do not hesitate to call with questions or concerns.   Elwin Sleight, [facility] Cell: [phone]

## 2020-06-24 NOTE — Progress Notes (Signed)
Patient off the floor to CT scan via hospital bed.

## 2020-06-24 NOTE — Progress Notes (Signed)
Patient stable Pain controlled Minimal pain with circumduction of both legs Ankle dorsiflexion intact Plan physical therapy for mobilization and discharge to home once she is safe with therapy

## 2020-06-24 NOTE — Evaluation (Signed)
Physical Therapy Evaluation Patient Details Name: Gabrielle Hoffman MRN: [id number] DOB: [birth year] Today's Date: 06/24/2020   History of Present Illness  Gabrielle Hoffman is a 86 y.o. female with medical history significant of intermittent complete heart block status post pacemaker, low back pain, hypertension, dementia, systolic heart failure who presents following a fall at home, resulting in R non-displaced pubic rami fxs (non-op mgmnt, WBAT), and T12 compression fx (appears chronic, some bony retropulsion with mild stenosis)  Clinical Impression   Pt admitted with above diagnosis. Comes from home where she lives with her son in a single level house with a few steps to enter; near independent at baseline, using assistive device for amb outside the home; Presents to PT with exquisite pain R hip and groin with any R hip movement/weight bearing, Pain limiting standing tolerance; Requires +2 max assist with standing and transfers;  Pt currently with functional limitations due to the deficits listed below (see PT Problem List). Pt will benefit from skilled PT to increase their independence and safety with mobility to allow discharge to the venue listed below.       Follow Up Recommendations SNF    Equipment Recommendations  Rolling walker with 5" wheels;3in1 (PT)    Recommendations for Other Services OT consult (as ordered)     Precautions / Restrictions Precautions Precautions: Back;Fall;Other (comment) Precaution Comments: verbal discussion of log roll; pubic rami fxs Restrictions Other Position/Activity Restrictions: WBAT for back      Mobility  Bed Mobility Overal bed mobility: Needs Assistance Bed Mobility: Rolling;Sidelying to Sit Rolling: Max assist Sidelying to sit: Max assist;+2 for physical assistance;+2 for safety/equipment       General bed mobility comments: Assist to reduce pain    Transfers Overall transfer level: Needs assistance Equipment used: Rolling walker  (2 wheeled) Transfers: Sit to/from [facility] Sit to Stand: Max assist;+2 physical assistance;+2 safety/equipment Stand pivot transfers: Max assist;+2 physical assistance;+2 safety/equipment       General transfer comment: Pt with much encouragement to stay upright instead of prematurely sitting  Ambulation/Gait                Stairs            Wheelchair Mobility    Modified Rankin (Stroke Patients Only)       Balance Overall balance assessment: Needs assistance   Sitting balance-Leahy Scale: Poor       Standing balance-Leahy Scale: Zero                               Pertinent Vitals/Pain Pain Assessment: Faces Faces Pain Scale: Hurts whole lot Pain Location: R groin with movement of RLE or Weight bearing Pain Descriptors / Indicators: Aching;Grimacing;Guarding;Crying Pain Intervention(s): Monitored during session;Repositioned    Home Living Family/patient expects to be discharged to:: Private residence Living Arrangements: Children Available Help at Discharge: Family;Available 24 hours/day Type of Home: House Home Access: Stairs to enter   [employer] of Steps: side patio 4 step with L rail; front 5 steps with rail Home Layout: One level Home Equipment: Grab bars - tub/shower;Walker - 2 wheels      Prior Function Level of Independence: Independent with assistive device(s)         Comments: Pt reports independence with ADL and RW for mobility occasionally.     Hand Dominance   Dominant Hand: Right    Extremity/Trunk Assessment   Upper Extremity Assessment Upper  Extremity Assessment: Generalized weakness    Lower Extremity Assessment Lower Extremity Assessment: Generalized weakness;RLE deficits/detail RLE Deficits / Details: extreme pain with R hip movement, or R LE weight bearing RLE: Unable to fully assess due to pain    Cervical / Trunk Assessment Cervical / Trunk Assessment: Kyphotic   Communication   Communication: HOH  Cognition Arousal/Alertness: Awake/alert Behavior During Therapy: WFL for tasks assessed/performed Overall Cognitive Status: Within Functional Limits for tasks assessed                                        General Comments      Exercises     Assessment/Plan    PT Assessment Patient needs continued PT services  PT Problem List Decreased strength;Decreased range of motion;Decreased activity tolerance;Decreased balance;Decreased mobility;Decreased coordination;Decreased knowledge of use of DME;Decreased safety awareness;Decreased knowledge of precautions;Pain       PT Treatment Interventions DME instruction;Gait training;Functional mobility training;Therapeutic activities;Therapeutic exercise;Balance training;Patient/family education;Wheelchair mobility training    PT Goals (Current goals can be found in the Care Plan section)  Acute Rehab PT Goals Patient Stated Goal: to not hurt PT Goal Formulation: Patient unable to participate in goal setting Time For Goal Achievement: 07/08/20 Potential to Achieve Goals: Fair    Frequency Min 2X/week   Barriers to discharge        Co-evaluation PT/OT/SLP Co-Evaluation/Treatment: Yes Reason for Co-Treatment: For patient/therapist safety;To address functional/ADL transfers (decr activity tolerance) PT goals addressed during session: Mobility/safety with mobility         AM-PAC PT "6 Clicks" Mobility  Outcome Measure Help needed turning from your back to your side while in a flat bed without using bedrails?: A Lot Help needed moving from lying on your back to sitting on the side of a flat bed without using bedrails?: A Lot Help needed moving to and from a bed to a chair (including a wheelchair)?: Total Help needed standing up from a chair using your arms (e.g., wheelchair or bedside chair)?: Total Help needed to walk in hospital room?: Total Help needed climbing 3-5 steps with a  railing? : Total 6 Click Score: 8    End of Session Equipment Utilized During Treatment: Gait belt Activity Tolerance: Patient limited by pain Patient left: in chair;with call bell/phone within reach;with chair alarm set Nurse Communication: Mobility status;Other (comment) (Consider drawsheet back to bed) PT Visit Diagnosis: Unsteadiness on feet (R26.81);Other abnormalities of gait and mobility (R26.89);History of falling (Z91.81);Pain Pain - Right/Left: Right Pain - part of body: Hip (and groin)    Time: 3536-1443 PT Time Calculation (min) (ACUTE ONLY): 17 min   Charges:   PT Evaluation $PT Eval Moderate Complexity: 1 Mod          Roney Marion, PT  Acute Rehabilitation Services Pager [phone] Office [phone]   [place] 06/24/2020, 5:18 PM

## 2020-06-24 NOTE — Evaluation (Signed)
Occupational Therapy Evaluation Patient Details Name: Gabrielle Hoffman MRN: [id number] DOB: February 06, 1931 Today's Date: 06/24/2020    History of Present Illness Gabrielle Hoffman is a 86 y.o. female with medical history significant of intermittent complete heart block status post pacemaker, low back pain, hypertension, dementia, systolic heart failure who presents following a fall at home, resulting in R non-displaced pubic rami fxs (non-op mgmnt, WBAT), and T12 compression fx (appears chronic, some bony retropulsion with mild stenosis)   Clinical Impression   Pt PTA: Pt was living with son and repors near independence with ADL and occasionally using an AD for mobility. Pt limited by requiring additional assist for mobility and ADL, pt with decreased strength and increased need to assist for balance. Pt set-upA to maxA for ADL and maxA+2 for bed mobility and transfers. Pt requires frequent cues to complete tasks. Pt would benefit from continued OT skilled services for ADL, mobility and safety. OT following acutely.    Follow Up Recommendations  SNF;Supervision/Assistance - 24 hour    Equipment Recommendations  Other (comment) (TBD)    Recommendations for Other Services       Precautions / Restrictions Precautions Precautions: Back;Fall;Other (comment) Precaution Comments: verbal discussion of log roll; pubic rami fxs Restrictions Weight Bearing Restrictions: Yes Other Position/Activity Restrictions: WBAT for back      Mobility Bed Mobility Overal bed mobility: Needs Assistance Bed Mobility: Rolling;Sidelying to Sit Rolling: Max assist Sidelying to sit: Max assist;+2 for physical assistance;+2 for safety/equipment       General bed mobility comments: Assist to reduce pain    Transfers Overall transfer level: Needs assistance Equipment used: Rolling walker (2 wheeled) Transfers: Sit to/from [facility] Sit to Stand: Max assist;+2 physical assistance;+2  safety/equipment Stand pivot transfers: Max assist;+2 physical assistance;+2 safety/equipment       General transfer comment: Pt with much encouragement to stay upright instead of prematurely sitting    Balance                                           ADL either performed or assessed with clinical judgement   ADL Overall ADL's : Needs assistance/impaired Eating/Feeding: Set up;Sitting   Grooming: Minimal assistance;Sitting   Upper Body Bathing: Minimal assistance;Sitting   Lower Body Bathing: Maximal assistance;Sitting/lateral leans;Sit to/from stand   Upper Body Dressing : Moderate assistance;Sitting   Lower Body Dressing: Maximal assistance;Sitting/lateral leans;Sit to/from stand   Toilet Transfer: Moderate assistance;BSC;Stand-pivot   Toileting- Clothing Manipulation and Hygiene: Moderate assistance;Sitting/lateral lean;Sit to/from stand       Functional mobility during ADLs: Moderate assistance;+2 for physical assistance;+2 for safety/equipment;Rolling walker;Cueing for safety General ADL Comments: Pt limited by requiring additional assist for mobility and ADL, pt with decreased strength and increased need to assist for balance.,      Vision Baseline Vision/History: No visual deficits Patient Visual Report: No change from baseline Vision Assessment?: No apparent visual deficits     Perception     Praxis      Pertinent Vitals/Pain Pain Assessment: 0-10 Pain Score: 1  Pain Intervention(s): Monitored during session     Hand Dominance Right   Extremity/Trunk Assessment Upper Extremity Assessment Upper Extremity Assessment: Generalized weakness   Lower Extremity Assessment Lower Extremity Assessment: Generalized weakness   Cervical / Trunk Assessment Cervical / Trunk Assessment: Kyphotic   Communication Communication Communication: HOH   Cognition Arousal/Alertness: Awake/alert Behavior During  Therapy: WFL for tasks  assessed/performed Overall Cognitive Status: Within Functional Limits for tasks assessed                                     General Comments       Exercises     Shoulder Instructions      Home Living Family/patient expects to be discharged to:: Private residence Living Arrangements: Children Available Help at Discharge: Family;Available 24 hours/day Type of Home: House Home Access: Stairs to enter [employer] of Steps: side patio 4 step with L rail; front 5 steps with rail   Home Layout: One level     Bathroom Shower/Tub: Teacher, early years/pre: Standard     Home Equipment: Grab bars - tub/shower;Walker - 2 wheels          Prior Functioning/Environment Level of Independence: Independent with assistive device(s)        Comments: Pt reports independence with ADL and RW for mobility occasionally.        OT Problem List: Decreased strength;Decreased activity tolerance;Impaired balance (sitting and/or standing);Impaired vision/perception;Decreased safety awareness;Impaired UE functional use;Pain;Increased edema;Decreased knowledge of use of DME or AE;Decreased knowledge of precautions;Decreased cognition      OT Treatment/Interventions: Self-care/ADL training;Therapeutic exercise;Energy conservation;DME and/or AE instruction;Therapeutic activities;Cognitive remediation/compensation;Patient/family education;Balance training    OT Goals(Current goals can be found in the care plan section) Acute Rehab OT Goals Patient Stated Goal: to not hurt OT Goal Formulation: With patient Time For Goal Achievement: 07/08/20 Potential to Achieve Goals: Good ADL Goals Pt Will Perform Grooming: with min guard assist;standing Pt Will Perform Lower Body Dressing: with min guard assist;sit to/from stand Pt Will Transfer to Toilet: with min guard assist;ambulating;bedside commode Pt/caregiver will Perform Home Exercise Program: Increased  strength;Both right and left upper extremity;With Supervision  OT Frequency: Min 2X/week   Barriers to D/C:            Co-evaluation              AM-PAC OT "6 Clicks" Daily Activity     Outcome Measure Help from another person eating meals?: A Little Help from another person taking care of personal grooming?: A Little Help from another person toileting, which includes using toliet, bedpan, or urinal?: A Lot Help from another person bathing (including washing, rinsing, drying)?: A Lot Help from another person to put on and taking off regular upper body clothing?: A Lot Help from another person to put on and taking off regular lower body clothing?: A Lot 6 Click Score: 14   End of Session Equipment Utilized During Treatment: Gait belt;Rolling walker Nurse Communication: Mobility status  Activity Tolerance: Patient tolerated treatment well;Patient limited by pain Patient left: in chair;with call bell/phone within reach;with chair alarm set  OT Visit Diagnosis: Unsteadiness on feet (R26.81);Muscle weakness (generalized) (M62.81);Pain Pain - part of body: Hip (back)                Time: 4709-6283 OT Time Calculation (min): 26 min Charges:  OT General Charges $OT Visit: 1 Visit OT Evaluation $OT Eval Moderate Complexity: 1 Mod  Jefferey Pica, OTR/L Acute Rehabilitation Services Pager: [phone] Office: [phone]   Audie Pinto 06/24/2020, 2:45 PM

## 2020-06-24 NOTE — Progress Notes (Addendum)
PROGRESS NOTE    VALBORG FRIAR   JAS:[id number]  DOB: [birth year]  DOA: 06/23/2020 PCP: Lajean Manes, MD   Brief Narrative:  Gabrielle Hoffman is a 86 y.o. female  lives with her son with medical history significant of intermittent complete heart block status post pacemaker, low back pain, hypertension, dementia, systolic heart failure who presents following a fall at home.  Due to patient's dementia history obtained with the assistance of the patient's son at bedside and chart review.  Patient slipped and fell in her kitchen yesterday and landed on her right buttock and lower back area.  Per her son, she was not able to walk since the fall and he has had to put her in a wheelchair to get around.  She has significant pain if trying to move with pain located in her right hip and back.  She did not lose consciousness nor hit her head when she fell.    In ED: Hip and pelvis x-rays> Possible nondisplaced superior right and inferior right pubic rami fractures  lumbar spine x-ray: 1. Probable moderate acute superior endplate fracture at A19. Possible mild superior endplate deformity at L3   Orthopedic surgery and neurosurgery consulted in the ED. Subjective: Having pain in her right leg when she tries to move it.  Also complains of nausea.     Assessment & Plan:   Principal Problem: Possible multiple pelvic fractures on the right -Nonoperative management at this time -Lives at home with her son-follow-up on PT eval  Active Problems: Possible lumbar and thoracic fractures -Neuro-Surgery was consulted in the ED -Further imaging with a CT of the lumbar and thoracic spine revealed that there is no acute lumbar fracture and the T12 fracture is chronic   Lung nodules with possibility of granulomatous disease -Noted on CT of the thoracic spine which was done without contrast> 23mm noncalcified right middle lobe nodule is indeterminate, although also suspected to be related to prior  atypical/granulomatous infection.  Noncontrasted CT recommended in 6 to 12 months-please see official report below  Nausea- acute on chronic - barely eating and drinking and is losing weight- started on Marinol by PCP  - nausea worse in hospital- oral intake poor - after discussion with son, it seems she may have GERD- start PPI QHS prior to dinner as most of her symptoms are at night - follow  Dementia -She is currently aware of where she is but does not know the month the year or her age  Hyperthyroidism - Continue home dose of methimazole    Chronic systolic heart failure -Last echo in 2016 revealed an EF of 45 to 50% -Not on diuretics   Anemia, normocytic -Hemoglobin has dropped from 12.4-10.9 -We will recheck tomorrow    Intermittent complete heart block status post pacemaker     Time spent in minutes: 35 DVT prophylaxis: Lovenox Code Status: Full code Family Communication:  Disposition Plan:  Status is: Observation If the patient stays overnight tonight, I will transition her to inpatient  Dispo: The patient is from: Home              Anticipated d/c is to: To be determined              Anticipated d/c date is: 2 days              Patient currently is not medically stable to d/c.      Consultants:   Orthopedic surgery  Neurosurgery Procedures:   None  Antimicrobials:  Anti-infectives (From admission, onward)   None       Objective: Vitals:   06/23/20 2200 06/23/20 2300 06/24/20 0300 06/24/20 0807  BP: (!) 158/78 (!) 179/92 124/62 (!) 153/65  Pulse: 90 100 85 82  Resp: 16 16 16 16   Temp:  98.3 F (36.8 C) 97.8 F (36.6 C) 98.2 F (36.8 C)  TempSrc:  Axillary Oral Oral  SpO2: 96% 97% 96% 97%  Weight:      Height:        Intake/Output Summary (Last 24 hours) at 06/24/2020 1227 Last data filed at 06/24/2020 0500 Gross per 24 hour  Intake 300 ml  Output 0 ml  Net 300 ml   Filed Weights   06/23/20 1658  Weight: 48 kg     Examination: General exam: Appears comfortable  HEENT: PERRLA, oral mucosa moist, no sclera icterus or thrush Respiratory system: Clear to auscultation. Respiratory effort normal. Cardiovascular system: S1 & S2 heard, RRR.   Gastrointestinal system: Abdomen soft, non-tender, nondistended. Normal bowel sounds. Central nervous system: Alert and oriented to place and person but not time - no focal neurological deficits. Extremities: No cyanosis, clubbing or edema-redness in posterior right thigh on exam Skin: No rashes or ulcers Psychiatry:  Mood & affect appropriate.     Data Reviewed: I have personally reviewed following labs and imaging studies  CBC: Recent Labs  Lab 06/23/20 1715 06/24/20 0628  WBC 9.7 7.5  NEUTROABS 7.4  --   HGB 12.4 10.9*  HCT 38.8 33.9*  MCV 95.1 93.6  PLT 237 324   Basic Metabolic Panel: Recent Labs  Lab 06/23/20 1715 06/24/20 0628  NA 136 136  K 4.5 4.4  CL 101 102  CO2 20* 25  GLUCOSE 112* 109*  BUN 8 15  CREATININE 0.72 0.94  CALCIUM 9.4 9.0   GFR: Estimated Creatinine Clearance: 32 mL/min (by C-G formula based on SCr of 0.94 mg/dL). Liver Function Tests: Recent Labs  Lab 06/23/20 1715 06/24/20 0628  AST 28 19  ALT 21 16  ALKPHOS 85 69  BILITOT 1.1 1.0  PROT 7.3 6.4*  ALBUMIN 4.0 3.4*   No results for input(s): LIPASE, AMYLASE in the last 168 hours. No results for input(s): AMMONIA in the last 168 hours. Coagulation Profile: No results for input(s): INR, PROTIME in the last 168 hours. Cardiac Enzymes: Recent Labs  Lab 06/23/20 1715  CKTOTAL 104   BNP (last 3 results) No results for input(s): PROBNP in the last 8760 hours. HbA1C: No results for input(s): HGBA1C in the last 72 hours. CBG: No results for input(s): GLUCAP in the last 168 hours. Lipid Profile: No results for input(s): CHOL, HDL, LDLCALC, TRIG, CHOLHDL, LDLDIRECT in the last 72 hours. Thyroid Function Tests: No results for input(s): TSH, T4TOTAL,  FREET4, T3FREE, THYROIDAB in the last 72 hours. Anemia Panel: No results for input(s): VITAMINB12, FOLATE, FERRITIN, TIBC, IRON, RETICCTPCT in the last 72 hours. Urine analysis:    Component Value Date/Time   COLORURINE YELLOW 11/18/2015 1849   APPEARANCEUR CLEAR 11/18/2015 1849   LABSPEC 1.008 11/18/2015 1849   PHURINE 7.0 11/18/2015 1849   GLUCOSEU NEGATIVE 11/18/2015 1849   HGBUR NEGATIVE 11/18/2015 1849   BILIRUBINUR NEGATIVE 11/18/2015 1849   KETONESUR NEGATIVE 11/18/2015 1849   PROTEINUR NEGATIVE 11/18/2015 1849   UROBILINOGEN 0.2 06/17/2015 2338   NITRITE POSITIVE (A) 11/18/2015 1849   LEUKOCYTESUR MODERATE (A) 11/18/2015 1849   Sepsis Labs: @LABRCNTIP (procalcitonin:4,lacticidven:4) ) Recent Results (from the past 240 hour(s))  Respiratory Panel by RT PCR (Flu A&B, Covid) - Nasopharyngeal Swab     Status: None   Collection Time: 06/23/20  8:09 PM   Specimen: Nasopharyngeal Swab; Nasopharyngeal(NP) swabs in vial transport medium  Result Value Ref Range Status   SARS Coronavirus 2 by RT PCR NEGATIVE NEGATIVE Final    Comment: (NOTE) SARS-CoV-2 target nucleic acids are NOT DETECTED.  The SARS-CoV-2 RNA is generally detectable in upper respiratoy specimens during the acute phase of infection. The lowest concentration of SARS-CoV-2 viral copies this assay can detect is 131 copies/mL. A negative result does not preclude SARS-Cov-2 infection and should not be used as the sole basis for treatment or other patient management decisions. A negative result may occur with  improper specimen collection/handling, submission of specimen other than nasopharyngeal swab, presence of viral mutation(s) within the areas targeted by this assay, and inadequate number of viral copies (<131 copies/mL). A negative result must be combined with clinical observations, patient history, and epidemiological information. The expected result is Negative.  Fact Sheet for Patients:   PinkCheek.be  Fact Sheet for Healthcare Providers:  GravelBags.it  This test is no t yet approved or cleared by the [place] FDA and  has been authorized for detection and/or diagnosis of SARS-CoV-2 by FDA under an Emergency Use Authorization (EUA). This EUA will remain  in effect (meaning this test can be used) for the duration of the COVID-19 declaration under Section 564(b)(1) of the Act, 21 U.S.C. section 360bbb-3(b)(1), unless the authorization is terminated or revoked sooner.     Influenza A by PCR NEGATIVE NEGATIVE Final   Influenza B by PCR NEGATIVE NEGATIVE Final    Comment: (NOTE) The Xpert Xpress SARS-CoV-2/FLU/RSV assay is intended as an aid in  the diagnosis of influenza from Nasopharyngeal swab specimens and  should not be used as a sole basis for treatment. Nasal washings and  aspirates are unacceptable for Xpert Xpress SARS-CoV-2/FLU/RSV  testing.  Fact Sheet for Patients: PinkCheek.be  Fact Sheet for Healthcare Providers: GravelBags.it  This test is not yet approved or cleared by the [place] FDA and  has been authorized for detection and/or diagnosis of SARS-CoV-2 by  FDA under an Emergency Use Authorization (EUA). This EUA will remain  in effect (meaning this test can be used) for the duration of the  Covid-19 declaration under Section 564(b)(1) of the Act, 21  U.S.C. section 360bbb-3(b)(1), unless the authorization is  terminated or revoked. Performed at [facility], [place] [address], [place], [place] [address]          Radiology Studies: DG Chest 1 View  Result Date: 06/23/2020 CLINICAL DATA:  Fall EXAM: CHEST  1 VIEW COMPARISON:  11/20/2015 FINDINGS: Left-sided pacing device as before. No focal opacity or pleural effusion. Probable calcified granuloma in the right infrahilar lung. Normal cardiomediastinal silhouette  with aortic atherosclerosis. No pneumothorax. IMPRESSION: No active disease. Electronically Signed   By: Donavan Foil M.D.   On: 06/23/2020 18:21   DG Lumbar Spine Complete  Result Date: 06/23/2020 CLINICAL DATA:  Fall EXAM: LUMBAR SPINE - COMPLETE 4+ VIEW COMPARISON:  MRI 12/27/2011 FINDINGS: Dextroscoliosis of the lumbar spine. Probable moderate acute superior endplate fracture at U72. Possible mild endplate deformity at L3. Aortic atherosclerosis. Moderate degenerative changes at L3-L4 and L5-S1. IMPRESSION: 1. Probable moderate acute superior endplate fracture at Z36. Possible mild superior endplate deformity at L3. 2. Dextroscoliosis of the lumbar spine with degenerative changes. Electronically Signed   By: Madie Reno.D.  On: 06/23/2020 18:20   CT THORACIC SPINE WO CONTRAST  Result Date: 06/24/2020 CLINICAL DATA:  Initial evaluation for compression fracture. EXAM: CT THORACIC SPINE WITHOUT CONTRAST TECHNIQUE: Multidetector CT images of the thoracic were obtained using the standard protocol without intravenous contrast. COMPARISON:  Prior radiograph from 06/23/2020. FINDINGS: Alignment: Same numbering system is employed as on previous exams. Examination is somewhat technically limited as the upper thoracic spine is incompletely visualized on this exam. T1 and T2 vertebral bodies are not completely visualized. Mild straightening of the normal midthoracic kyphosis. No listhesis. Vertebrae: Previously identified compression deformity involving the superior endplate of Z61 is chronic in appearance by CT. Associated height loss measures up to 30% with 4 mm bony retropulsion. Resultant mild spinal stenosis at this level. Otherwise, vertebral body height maintained with no other acute or chronic fracture identified. No discrete or worrisome osseous lesions. Visualized ribs intact. Paraspinal and other soft tissues: Paraspinous soft tissues demonstrate no acute abnormality. Aortic atherosclerosis.  Cardiac pacemaker electrodes partially visualized. 7 mm nodule seen within the right middle lobe (series 4, image 98). Adjacent calcified granuloma. Additional scattered atelectatic changes noted dependently within the visualized lung bases. Irregular pleuroparenchymal thickening noted at the lung apices bilaterally. Partially visualized lungs are otherwise clear. Disc levels: T6-7: Mild disc bulge with reactive endplate change. No significant spinal stenosis. Foramina remain patent. T7-8: Central disc osteophyte complex indents the ventral thecal sac (series 4, image 62). No significant spinal stenosis. Foramina remain patent. 4 mm bony retropulsion at the level of T11-12 related to the chronic T12 compression fracture. Mild spinal stenosis. Foramina remain patent. No other significant disc pathology seen for patient age. Mild multilevel facet degeneration noted. No other significant spinal stenosis. Foramina remain patent. IMPRESSION: 1. Compression deformity involving the superior endplate of W96 is chronic in nature. Associated 30% height loss with 4 mm bony retropulsion and mild spinal stenosis at this level. 2. No other acute fracture or other traumatic injury within the thoracic spine. 3. Few scattered calcified granulomas with the visualized right middle lobe, suggesting prior granulomatous infection. Additional 7 mm noncalcified right middle lobe nodule is indeterminate, although also suspected to be related to prior atypical/granulomatous infection. Non-contrast chest CT at 6-12 months is recommended. If the nodule is stable at time of repeat CT, then future CT at 18-24 months (from today's scan) is considered optional for low-risk patients, but is recommended for high-risk patients. This recommendation follows the consensus statement: Guidelines for Management of Incidental Pulmonary Nodules Detected on CT Images: From the Fleischner Society 2017; Radiology 2017; 284:228-243. 4. Aortic Atherosclerosis  (ICD10-I70.0). Electronically Signed   By: Jeannine Boga M.D.   On: 06/24/2020 03:17   CT LUMBAR SPINE WO CONTRAST  Result Date: 06/23/2020 CLINICAL DATA:  Low back pain after fall EXAM: CT LUMBAR SPINE WITHOUT CONTRAST TECHNIQUE: Multidetector CT imaging of the lumbar spine was performed without intravenous contrast administration. Multiplanar CT image reconstructions were also generated. COMPARISON:  None. FINDINGS: Segmentation: 5 lumbar type vertebrae. Alignment: Dextroscoliosis centered at L3. No significant anteroposterior listhesis. Vertebrae: No acute fracture of the lumbar spine. T12 compression fracture is present on scout but superior endplate is not included in the field of view of CT. Paraspinal and other soft tissues: Metallic density the left ventral upper abdomen. Aortic atherosclerosis. Stool throughout the visualized colon. Disc levels: Advanced multilevel degenerative changes with disc bulges, endplate osteophytes, and facet hypertrophy. IMPRESSION: Superior endplate of E45 is not included in the field of view. There is no acute  lumbar spine fracture. Electronically Signed   By: Macy Mis M.D.   On: 06/23/2020 21:25   DG Hip Unilat W or Wo Pelvis 2-3 Views Right  Result Date: 06/23/2020 CLINICAL DATA:  Fall and pain EXAM: DG HIP (WITH OR WITHOUT PELVIS) 2-3V RIGHT COMPARISON:  None. FINDINGS: There is question of cortical step-off seen at the superior right pubic rami and inferior pubic rami which could represent nondisplaced fractures. Moderate right hip osteoarthritis is seen with superior joint space loss and marginal osteophyte formation. No acute osseous abnormality. IMPRESSION: Possible nondisplaced superior right and inferior right pubic rami fractures. If further evaluation is required would recommend cross-sectional imaging. Electronically Signed   By: Prudencio Pair M.D.   On: 06/23/2020 18:23      Scheduled Meds: . enoxaparin (LOVENOX) injection  40 mg  Subcutaneous Q24H  . methimazole  10 mg Oral Daily  . sodium chloride flush  3 mL Intravenous Q12H   Continuous Infusions:   LOS: 0 days      Debbe Odea, MD [facility] Pager: [email or web address] 06/24/2020, 12:27 PM

## 2020-06-25 ENCOUNTER — Encounter (HOSPITAL_COMMUNITY): Payer: Self-pay | Admitting: Internal Medicine

## 2020-06-25 DIAGNOSIS — S32591A Other specified fracture of right pubis, initial encounter for closed fracture: Secondary | ICD-10-CM | POA: Diagnosis not present

## 2020-06-25 LAB — CBC
HCT: 31.9 % — ABNORMAL LOW (ref 36.0–46.0)
Hemoglobin: 10.3 g/dL — ABNORMAL LOW (ref 12.0–15.0)
MCH: 30.8 pg (ref 26.0–34.0)
MCHC: 32.3 g/dL (ref 30.0–36.0)
MCV: 95.5 fL (ref 80.0–100.0)
Platelets: 239 10*3/uL (ref 150–400)
RBC: 3.34 MIL/uL — ABNORMAL LOW (ref 3.87–5.11)
RDW: 14.6 % (ref 11.5–15.5)
WBC: 9.1 10*3/uL (ref 4.0–10.5)
nRBC: 0 % (ref 0.0–0.2)

## 2020-06-25 LAB — BASIC METABOLIC PANEL
Anion gap: 7 (ref 5–15)
BUN: 20 mg/dL (ref 8–23)
CO2: 28 mmol/L (ref 22–32)
Calcium: 9.2 mg/dL (ref 8.9–10.3)
Chloride: 102 mmol/L (ref 98–111)
Creatinine, Ser: 0.96 mg/dL (ref 0.44–1.00)
GFR, Estimated: 57 mL/min — ABNORMAL LOW (ref >60)
Glucose, Bld: 101 mg/dL — ABNORMAL HIGH (ref 70–99)
Potassium: 5.1 mmol/L (ref 3.5–5.1)
Sodium: 137 mmol/L (ref 135–145)

## 2020-06-25 MED ORDER — WHITE PETROLATUM EX OINT
TOPICAL_OINTMENT | CUTANEOUS | Status: AC
  Administered 2020-06-25: 0.2
  Filled 2020-06-25: qty 28.35

## 2020-06-25 NOTE — Progress Notes (Signed)
PROGRESS NOTE    Gabrielle Hoffman   QJF:[id number]  DOB: 1931-02-08  DOA: 06/23/2020 PCP: Lajean Manes, MD   Brief Narrative:  Gabrielle Hoffman is a 86 y.o. female  lives with her son with medical history significant of intermittent complete heart block status post pacemaker, low back pain, hypertension, dementia, systolic heart failure who presents following a fall at home.  Due to patient's dementia history obtained with the assistance of the patient's son at bedside and chart review.  Patient slipped and fell in her kitchen yesterday and landed on her right buttock and lower back area.  Per her son, she was not able to walk since the fall and he has had to put her in a wheelchair to get around.  She has significant pain if trying to move with pain located in her right hip and back.  She did not lose consciousness nor hit her head when she fell.    In ED: Hip and pelvis x-rays> Possible nondisplaced superior right and inferior right pubic rami fractures  lumbar spine x-ray: 1. Probable moderate acute superior endplate fracture at S93. Possible mild superior endplate deformity at L3   Orthopedic surgery and neurosurgery consulted in the ED.   Subjective: No new complaints. Nausea comes and goes. Zofran was given last night but did not help much.   Assessment & Plan:   Principal Problem: Possible multiple pelvic fractures on the right -Nonoperative management  - pain control -Lives at home with her son-PT recommends SNF and we are waiting on a bed  Active Problems: Possible lumbar and thoracic fractures -Neuro-Surgery was consulted in the ED -Further imaging with a CT of the lumbar and thoracic spine revealed that there is no acute lumbar fracture and the T12 fracture is chronic   Nausea- acute on chronic with poor oral intake and weight loss - barely eating and drinking and is losing weight per her son- started on Marinol by PCP  - nausea worse in hospital- oral intake very  poor  - after discussion with son, it seems she may have GERD - 11/21>  started PPI QHS prior to dinner as most of her symptoms are at night - added Pepcid BID for now - cont to follow- barely eating half her meals and has poor oral liquid intake- have asked for strict I and O and encouraging oral intake  Lung nodules with possibility of granulomatous disease -Noted on CT of the thoracic spine which was done without contrast> 31mm noncalcified right middle lobe nodule is indeterminate, although also suspected to be related to prior atypical/granulomatous infection.  Noncontrasted CT recommended in 6 to 12 months-please see official report below    Dementia -She is currently aware of where she is but does not know the month the year or her age  Hyperthyroidism - Continue home dose of methimazole    Chronic systolic heart failure -Last echo in 2016 revealed an EF of 45 to 50% -Not on diuretics - euvolemic   Anemia, normocytic -Hemoglobin has dropped from 12.4-10.9  - stable today    Intermittent complete heart block status post pacemaker     Time spent in minutes: 35 DVT prophylaxis: Lovenox Code Status: Full code Family Communication:  Disposition Plan:  Status is: Inpatient- waiting for oral intake to improve  Dispo: The patient is from: Home              Anticipated d/c is to: SNF  Anticipated d/c date is: 2-3 days              Patient currently is not medically stable to d/c.   Consultants:   Orthopedic surgery  Neurosurgery Procedures:   None Antimicrobials:  Anti-infectives (From admission, onward)   None       Objective: Vitals:   06/24/20 2200 06/25/20 0227 06/25/20 0500 06/25/20 0919  BP: (!) 121/57 128/68  (!) 135/54  Pulse: 85 78  94  Resp: 17 17  18   Temp: 98.2 F (36.8 C) 98.6 F (37 C)  99.2 F (37.3 C)  TempSrc: Oral Oral  Oral  SpO2: 100% 97%  97%  Weight:   49 kg   Height:        Intake/Output Summary (Last 24 hours)  at 06/25/2020 1412 Last data filed at 06/25/2020 0900 Gross per 24 hour  Intake 590 ml  Output 200 ml  Net 390 ml   Filed Weights   06/23/20 1658 06/25/20 0500  Weight: 48 kg 49 kg    Examination: General exam: Appears comfortable  HEENT: PERRLA, oral mucosa moist, no sclera icterus or thrush Respiratory system: Clear to auscultation. Respiratory effort normal. Cardiovascular system: S1 & S2 heard,  No murmurs  Gastrointestinal system: Abdomen soft, non-tender, nondistended. Normal bowel sounds   Central nervous system: Alert and oriented. No focal neurological deficits. Extremities: No cyanosis, clubbing or edema Skin: No rashes or ulcers Psychiatry:  Mood & affect appropriate.     Data Reviewed: I have personally reviewed following labs and imaging studies  CBC: Recent Labs  Lab 06/23/20 1715 06/24/20 0628 06/25/20 0850  WBC 9.7 7.5 9.1  NEUTROABS 7.4  --   --   HGB 12.4 10.9* 10.3*  HCT 38.8 33.9* 31.9*  MCV 95.1 93.6 95.5  PLT 237 245 026   Basic Metabolic Panel: Recent Labs  Lab 06/23/20 1715 06/24/20 0628 06/25/20 0850  NA 136 136 137  K 4.5 4.4 5.1  CL 101 102 102  CO2 20* 25 28  GLUCOSE 112* 109* 101*  BUN 8 15 20   CREATININE 0.72 0.94 0.96  CALCIUM 9.4 9.0 9.2   GFR: Estimated Creatinine Clearance: 31.9 mL/min (by C-G formula based on SCr of 0.96 mg/dL). Liver Function Tests: Recent Labs  Lab 06/23/20 1715 06/24/20 0628  AST 28 19  ALT 21 16  ALKPHOS 85 69  BILITOT 1.1 1.0  PROT 7.3 6.4*  ALBUMIN 4.0 3.4*   No results for input(s): LIPASE, AMYLASE in the last 168 hours. No results for input(s): AMMONIA in the last 168 hours. Coagulation Profile: No results for input(s): INR, PROTIME in the last 168 hours. Cardiac Enzymes: Recent Labs  Lab 06/23/20 1715  CKTOTAL 104   BNP (last 3 results) No results for input(s): PROBNP in the last 8760 hours. HbA1C: No results for input(s): HGBA1C in the last 72 hours. CBG: No results for  input(s): GLUCAP in the last 168 hours. Lipid Profile: No results for input(s): CHOL, HDL, LDLCALC, TRIG, CHOLHDL, LDLDIRECT in the last 72 hours. Thyroid Function Tests: No results for input(s): TSH, T4TOTAL, FREET4, T3FREE, THYROIDAB in the last 72 hours. Anemia Panel: No results for input(s): VITAMINB12, FOLATE, FERRITIN, TIBC, IRON, RETICCTPCT in the last 72 hours. Urine analysis:    Component Value Date/Time   COLORURINE YELLOW 11/18/2015 [place] 11/18/2015 1849   LABSPEC 1.008 11/18/2015 1849   PHURINE 7.0 11/18/2015 1849   GLUCOSEU NEGATIVE 11/18/2015 1849   HGBUR NEGATIVE 11/18/2015  [place] 11/18/2015 [place] 11/18/2015 [place] 11/18/2015 1849   UROBILINOGEN 0.2 06/17/2015 2338   NITRITE POSITIVE (A) 11/18/2015 [place] (A) 11/18/2015 1849   Sepsis Labs: @LABRCNTIP (procalcitonin:4,lacticidven:4) ) Recent Results (from the past 240 hour(s))  Respiratory Panel by RT PCR (Flu A&B, Covid) - Nasopharyngeal Swab     Status: None   Collection Time: 06/23/20  8:09 PM   Specimen: Nasopharyngeal Swab; Nasopharyngeal(NP) swabs in vial transport medium  Result Value Ref Range Status   SARS Coronavirus 2 by RT PCR NEGATIVE NEGATIVE Final    Comment: (NOTE) SARS-CoV-2 target nucleic acids are NOT DETECTED.  The SARS-CoV-2 RNA is generally detectable in upper respiratoy specimens during the acute phase of infection. The lowest concentration of SARS-CoV-2 viral copies this assay can detect is 131 copies/mL. A negative result does not preclude SARS-Cov-2 infection and should not be used as the sole basis for treatment or other patient management decisions. A negative result may occur with  improper specimen collection/handling, submission of specimen other than nasopharyngeal swab, presence of viral mutation(s) within the areas targeted by this assay, and inadequate number of viral  copies (<131 copies/mL). A negative result must be combined with clinical observations, patient history, and epidemiological information. The expected result is Negative.  Fact Sheet for Patients:  PinkCheek.be  Fact Sheet for Healthcare Providers:  GravelBags.it  This test is no t yet approved or cleared by the [place] FDA and  has been authorized for detection and/or diagnosis of SARS-CoV-2 by FDA under an Emergency Use Authorization (EUA). This EUA will remain  in effect (meaning this test can be used) for the duration of the COVID-19 declaration under Section 564(b)(1) of the Act, 21 U.S.C. section 360bbb-3(b)(1), unless the authorization is terminated or revoked sooner.     Influenza A by PCR NEGATIVE NEGATIVE Final   Influenza B by PCR NEGATIVE NEGATIVE Final    Comment: (NOTE) The Xpert Xpress SARS-CoV-2/FLU/RSV assay is intended as an aid in  the diagnosis of influenza from Nasopharyngeal swab specimens and  should not be used as a sole basis for treatment. Nasal washings and  aspirates are unacceptable for Xpert Xpress SARS-CoV-2/FLU/RSV  testing.  Fact Sheet for Patients: PinkCheek.be  Fact Sheet for Healthcare Providers: GravelBags.it  This test is not yet approved or cleared by the [place] FDA and  has been authorized for detection and/or diagnosis of SARS-CoV-2 by  FDA under an Emergency Use Authorization (EUA). This EUA will remain  in effect (meaning this test can be used) for the duration of the  Covid-19 declaration under Section 564(b)(1) of the Act, 21  U.S.C. section 360bbb-3(b)(1), unless the authorization is  terminated or revoked. Performed at [facility], [place] [address] [place], [place] [address]          Radiology Studies: DG Chest 1 View  Result Date: 06/23/2020 CLINICAL DATA:  Fall EXAM: CHEST  1 VIEW  COMPARISON:  11/20/2015 FINDINGS: Left-sided pacing device as before. No focal opacity or pleural effusion. Probable calcified granuloma in the right infrahilar lung. Normal cardiomediastinal silhouette with aortic atherosclerosis. No pneumothorax. IMPRESSION: No active disease. Electronically Signed   By: Donavan Foil M.D.   On: 06/23/2020 18:21   DG Lumbar Spine Complete  Result Date: 06/23/2020 CLINICAL DATA:  Fall EXAM: LUMBAR SPINE - COMPLETE 4+ VIEW COMPARISON:  MRI 12/27/2011 FINDINGS: Dextroscoliosis of the lumbar spine. Probable moderate acute superior endplate  fracture at T12. Possible mild endplate deformity at L3. Aortic atherosclerosis. Moderate degenerative changes at L3-L4 and L5-S1. IMPRESSION: 1. Probable moderate acute superior endplate fracture at P95. Possible mild superior endplate deformity at L3. 2. Dextroscoliosis of the lumbar spine with degenerative changes. Electronically Signed   By: Donavan Foil M.D.   On: 06/23/2020 18:20   CT THORACIC SPINE WO CONTRAST  Result Date: 06/24/2020 CLINICAL DATA:  Initial evaluation for compression fracture. EXAM: CT THORACIC SPINE WITHOUT CONTRAST TECHNIQUE: Multidetector CT images of the thoracic were obtained using the standard protocol without intravenous contrast. COMPARISON:  Prior radiograph from 06/23/2020. FINDINGS: Alignment: Same numbering system is employed as on previous exams. Examination is somewhat technically limited as the upper thoracic spine is incompletely visualized on this exam. T1 and T2 vertebral bodies are not completely visualized. Mild straightening of the normal midthoracic kyphosis. No listhesis. Vertebrae: Previously identified compression deformity involving the superior endplate of K93 is chronic in appearance by CT. Associated height loss measures up to 30% with 4 mm bony retropulsion. Resultant mild spinal stenosis at this level. Otherwise, vertebral body height maintained with no other acute or chronic  fracture identified. No discrete or worrisome osseous lesions. Visualized ribs intact. Paraspinal and other soft tissues: Paraspinous soft tissues demonstrate no acute abnormality. Aortic atherosclerosis. Cardiac pacemaker electrodes partially visualized. 7 mm nodule seen within the right middle lobe (series 4, image 98). Adjacent calcified granuloma. Additional scattered atelectatic changes noted dependently within the visualized lung bases. Irregular pleuroparenchymal thickening noted at the lung apices bilaterally. Partially visualized lungs are otherwise clear. Disc levels: T6-7: Mild disc bulge with reactive endplate change. No significant spinal stenosis. Foramina remain patent. T7-8: Central disc osteophyte complex indents the ventral thecal sac (series 4, image 62). No significant spinal stenosis. Foramina remain patent. 4 mm bony retropulsion at the level of T11-12 related to the chronic T12 compression fracture. Mild spinal stenosis. Foramina remain patent. No other significant disc pathology seen for patient age. Mild multilevel facet degeneration noted. No other significant spinal stenosis. Foramina remain patent. IMPRESSION: 1. Compression deformity involving the superior endplate of O67 is chronic in nature. Associated 30% height loss with 4 mm bony retropulsion and mild spinal stenosis at this level. 2. No other acute fracture or other traumatic injury within the thoracic spine. 3. Few scattered calcified granulomas with the visualized right middle lobe, suggesting prior granulomatous infection. Additional 7 mm noncalcified right middle lobe nodule is indeterminate, although also suspected to be related to prior atypical/granulomatous infection. Non-contrast chest CT at 6-12 months is recommended. If the nodule is stable at time of repeat CT, then future CT at 18-24 months (from today's scan) is considered optional for low-risk patients, but is recommended for high-risk patients. This recommendation  follows the consensus statement: Guidelines for Management of Incidental Pulmonary Nodules Detected on CT Images: From the Fleischner Society 2017; Radiology 2017; 284:228-243. 4. Aortic Atherosclerosis (ICD10-I70.0). Electronically Signed   By: Jeannine Boga M.D.   On: 06/24/2020 03:17   CT LUMBAR SPINE WO CONTRAST  Result Date: 06/23/2020 CLINICAL DATA:  Low back pain after fall EXAM: CT LUMBAR SPINE WITHOUT CONTRAST TECHNIQUE: Multidetector CT imaging of the lumbar spine was performed without intravenous contrast administration. Multiplanar CT image reconstructions were also generated. COMPARISON:  None. FINDINGS: Segmentation: 5 lumbar type vertebrae. Alignment: Dextroscoliosis centered at L3. No significant anteroposterior listhesis. Vertebrae: No acute fracture of the lumbar spine. T12 compression fracture is present on scout but superior endplate is not included in the  field of view of CT. Paraspinal and other soft tissues: Metallic density the left ventral upper abdomen. Aortic atherosclerosis. Stool throughout the visualized colon. Disc levels: Advanced multilevel degenerative changes with disc bulges, endplate osteophytes, and facet hypertrophy. IMPRESSION: Superior endplate of X48 is not included in the field of view. There is no acute lumbar spine fracture. Electronically Signed   By: Macy Mis M.D.   On: 06/23/2020 21:25   DG Hip Unilat W or Wo Pelvis 2-3 Views Right  Result Date: 06/23/2020 CLINICAL DATA:  Fall and pain EXAM: DG HIP (WITH OR WITHOUT PELVIS) 2-3V RIGHT COMPARISON:  None. FINDINGS: There is question of cortical step-off seen at the superior right pubic rami and inferior pubic rami which could represent nondisplaced fractures. Moderate right hip osteoarthritis is seen with superior joint space loss and marginal osteophyte formation. No acute osseous abnormality. IMPRESSION: Possible nondisplaced superior right and inferior right pubic rami fractures. If further  evaluation is required would recommend cross-sectional imaging. Electronically Signed   By: Prudencio Pair M.D.   On: 06/23/2020 18:23      Scheduled Meds: . enoxaparin (LOVENOX) injection  40 mg Subcutaneous Q24H  . famotidine  20 mg Oral BID  . feeding supplement  237 mL Oral TID BM  . methimazole  10 mg Oral Daily  . pantoprazole  40 mg Oral QAC supper  . sodium chloride flush  3 mL Intravenous Q12H   Continuous Infusions:   LOS: 1 day      Debbe Odea, MD [facility] Pager: [email or web address] 06/25/2020, 2:12 PM

## 2020-06-25 NOTE — NC FL2 (Signed)
[place] MEDICAID FL2 LEVEL OF CARE SCREENING TOOL     IDENTIFICATION  Patient Name: Gabrielle Hoffman Birthdate: 20-Aug-1930 Sex: female Admission Date (Current Location): 06/23/2020  [facility] [place] Number:  Herbalist and Address:  [facility], [place] [address], [place], [place] [address]      Provider Number: [id number]  Attending Physician Name and Address:  Debbe Odea, MD  Relative Name and Phone Number:  Blen Ransome, son - [phone]    Current Level of Care: Hospital Recommended Level of Care: [place] Prior Approval Number:    Date Approved/Denied:   PASRR Number: [id number]  Discharge Plan: SNF    Current Diagnoses: Patient Active Problem List   Diagnosis Date Noted  . Lung nodule 06/24/2020  . Pelvic fracture ([place]) 06/24/2020  . Nausea   . Weight loss   . Fracture of multiple pubic rami, right, closed, initial encounter ([place]) 06/23/2020  . Fall at home, initial encounter 06/23/2020  . Fracture of lumbar spine ([place]) 06/23/2020  . Pacemaker 04/03/2020  . Intermittent complete heart block ([place]) 11/19/2015  . Chronic systolic heart failure ([place])   . Cognitive change 11/18/2015  . Arrhythmia 11/18/2015  . Syncope and collapse 11/18/2015  . Syncope 07/11/2015  . Low back pain 12/19/2011  . Hyponatremia 12/17/2011  . Dehydration 12/17/2011  . UTI (lower urinary tract infection) 12/17/2011  . HTN (hypertension) 12/17/2011  . Dementia ([place]) 12/17/2011  . 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/12/2011  . Open angle with borderline findings, low risk 05/12/2011  . Dermatochalasis 05/12/2011    Orientation RESPIRATION BLADDER Height & Weight     Self, Time, Situation, Place  Normal Continent Weight: 49 kg Height:  5\' 6"  (167.6 cm)  BEHAVIORAL SYMPTOMS/MOOD NEUROLOGICAL BOWEL NUTRITION STATUS      Continent Diet (See discharge summary)  AMBULATORY STATUS COMMUNICATION OF NEEDS Skin   Total Care Verbally  Normal                       Personal Care Assistance Level of Assistance  Bathing, Dressing Bathing Assistance: Limited assistance   Dressing Assistance: Limited assistance     Functional Limitations Info  Sight, Hearing, Speech Sight Info: Adequate Hearing Info: Impaired Speech Info: Adequate    SPECIAL CARE FACTORS FREQUENCY  PT (By licensed PT), OT (By licensed OT)     PT Frequency: 5 x per week OT Frequency: 5 x per week            Contractures Contractures Info: Present    Additional Factors Info  Code Status, Allergies Code Status Info: Full code Allergies Info: sulfa           Current Medications (06/25/2020):  This is the current hospital active medication list Current Facility-Administered Medications  Medication Dose Route Frequency Provider Last Rate Last Admin  . acetaminophen (TYLENOL) tablet 650 mg  650 mg Oral Q6H PRN Marcelyn Bruins, MD       Or  . acetaminophen (TYLENOL) suppository 650 mg  650 mg Rectal Q6H PRN Marcelyn Bruins, MD      . dronabinol (MARINOL) capsule 2.5 mg  2.5 mg Oral Daily PRN Debbe Odea, MD      . enoxaparin (LOVENOX) injection 40 mg  40 mg Subcutaneous Q24H Marcelyn Bruins, MD   40 mg at 06/24/20 2258  . famotidine (PEPCID) tablet 20 mg  20 mg Oral BID Debbe Odea, MD   20 mg at  06/25/20 0900  . feeding supplement (ENSURE ENLIVE / ENSURE PLUS) liquid 237 mL  237 mL Oral TID BM Rizwan, Saima, MD   237 mL at 06/25/20 1042  . HYDROcodone-acetaminophen (NORCO/VICODIN) 5-325 MG per tablet 1 tablet  1 tablet Oral Q6H PRN Marcelyn Bruins, MD   1 tablet at 06/25/20 0900  . methimazole (TAPAZOLE) tablet 10 mg  10 mg Oral Daily Marcelyn Bruins, MD   10 mg at 06/25/20 0900  . morphine 2 MG/ML injection 2 mg  2 mg Intravenous Q4H PRN Debbe Odea, MD      . ondansetron (ZOFRAN) injection 4 mg  4 mg Intravenous Q6H PRN Marcelyn Bruins, MD   4 mg at 06/25/20 1127  . pantoprazole (PROTONIX) EC tablet 40 mg   40 mg Oral QAC supper Debbe Odea, MD   40 mg at 06/24/20 1711  . sodium chloride flush (NS) 0.9 % injection 3 mL  3 mL Intravenous Q12H Marcelyn Bruins, MD   3 mL at 06/25/20 1100     Discharge Medications: Please see discharge summary for a list of discharge medications.  Relevant Imaging Results:  Relevant Lab Results:   Additional Information SS# [id number], fully vaccinated for COVID  Curlene Labrum, RN

## 2020-06-25 NOTE — TOC Initial Note (Addendum)
Transition of Care [facility]) - Initial/Assessment Note    Patient Details  Name: Gabrielle Hoffman MRN: [id number] Date of Birth: [birth year]  Transition of Care [facility]) CM/SW Contact:    Curlene Labrum, RN Phone Number: 06/25/2020, 2:31 PM  Clinical Narrative:                 Case management spoke with the patient's son, Wanona Stare on the phone regarding patient's transition to a SNF facility to rehab for PT/OT after her fall at home and pubic rami fracture and T-12 compression fracture.  The patient's son states that the patient lives at home with him and is not planning on having surgical interventions for her injuries and the son would like patient placed a [facility] in [place], [place].  I gave the son, [name] information regarding [place], [place] SNF facilities through [email or web address] website for additional information.  The son had already spoke with Elyse Hsu, CM at [facility] and they do have an available bed opening for the patient.  The patient is fully vaccinated for COVID through [employer] including the booster shot.  Case management called [facility] and spoke with Elyse Hsu, CM at the facility and sent initial clinicals through the hub to the facility.  I will meet the son this afternoon to answer any questions regarding placement for rehab.  06/25/2020 1615 - [facility] is saving an available SNf bed for the patient.  Once she is medically ready - the patient will be able to transfer to the facility via PTAR - no insurance authorization is needed since the patient has Medicare A.   Expected Discharge Plan: Skilled Nursing Facility Barriers to Discharge: Continued Medical Work up   Patient Goals and CMS Choice Patient states their goals for this hospitalization and ongoing recovery are:: Patient's son would like patient to receive rehab at a SNF facility - possibly [facility] in [place], [place] CMS Medicare.gov Compare Post Acute Care list provided to:: Patient  Represenative (must comment) (patient's son) Choice offered to / list presented to : Adult Children  Expected Discharge Plan and Services Expected Discharge Plan: [place]   Discharge Planning Services: CM Consult Post Acute Care Choice: [place] Living arrangements for the past 2 months: [place]                                      Prior Living Arrangements/Services Living arrangements for the past 2 months: [place] Lives with:: Adult Children Patient language and need for interpreter reviewed:: Yes Do you feel safe going back to the place where you live?: Yes      Need for Family Participation in Patient Care: Yes (Comment) Care giver support system in place?: Yes (comment) Current home services: DME Criminal Activity/Legal Involvement Pertinent to Current Situation/Hospitalization: No - Comment as needed  Activities of Daily Living Home Assistive Devices/Equipment: None ADL Screening (condition at time of admission) Patient's cognitive ability adequate to safely complete daily activities?: No Is the patient deaf or have difficulty hearing?: No Does the patient have difficulty seeing, even when wearing glasses/contacts?: No Does the patient have difficulty concentrating, remembering, or making decisions?: No Patient able to express need for assistance with ADLs?: Yes Does the patient have difficulty dressing or bathing?: Yes Independently performs ADLs?: Yes (appropriate for developmental age) Does the patient have difficulty walking or climbing stairs?: Yes Weakness of Legs: None Weakness  of Arms/Hands: None  Permission Sought/Granted Permission sought to share information with : Case Manager Permission granted to share information with : Yes, Verbal Permission Granted     Permission granted to share info w AGENCY: SNF facility  Permission granted to share info w Relationship: son, Lexie Koehl -  [phone]     Emotional Assessment Appearance:: Appears stated age Attitude/Demeanor/Rapport: Engaged Affect (typically observed): Accepting Orientation: : Oriented to Self, Oriented to Place, Oriented to  Time, Oriented to Situation Alcohol / Substance Use: Not Applicable Psych Involvement: No (comment)  Admission diagnosis:  Lumbar compression fracture, closed, initial encounter ([place]) [S32.000A] Fracture of multiple pubic rami, right, closed, initial encounter ([place]) [S32.591A] Closed fracture of multiple pubic rami, right, initial encounter ([place]) [S32.591A] Pelvic fracture ([place]) [S32.9XXA] Patient Active Problem List   Diagnosis Date Noted  . Lung nodule 06/24/2020  . Pelvic fracture ([place]) 06/24/2020  . Nausea   . Weight loss   . Fracture of multiple pubic rami, right, closed, initial encounter ([place]) 06/23/2020  . Fall at home, initial encounter 06/23/2020  . Fracture of lumbar spine ([place]) 06/23/2020  . Pacemaker 04/03/2020  . Intermittent complete heart block ([place]) 11/19/2015  . Chronic systolic heart failure ([place])   . Cognitive change 11/18/2015  . Arrhythmia 11/18/2015  . Syncope and collapse 11/18/2015  . Syncope 07/11/2015  . Low back pain 12/19/2011  . Hyponatremia 12/17/2011  . Dehydration 12/17/2011  . UTI (lower urinary tract infection) 12/17/2011  . HTN (hypertension) 12/17/2011  . Dementia ([place]) 12/17/2011  . 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/12/2011  . Open angle with borderline findings, low risk 05/12/2011  . Dermatochalasis 05/12/2011   PCP:  Lajean Manes, MD Pharmacy:   [facility] [address] [place] - [address] [place] [place] [address] Phone: [phone] Fax: [phone]     Social Determinants of Health (SDOH) Interventions    Readmission Risk Interventions Readmission Risk Prevention Plan 06/25/2020  Post Dischage Appt Complete  Medication Screening Complete  Transportation Screening Complete   Some recent data might be hidden

## 2020-06-25 NOTE — Plan of Care (Signed)

## 2020-06-26 DIAGNOSIS — I10 Essential (primary) hypertension: Secondary | ICD-10-CM | POA: Diagnosis not present

## 2020-06-26 DIAGNOSIS — F0390 Unspecified dementia without behavioral disturbance: Secondary | ICD-10-CM | POA: Diagnosis not present

## 2020-06-26 DIAGNOSIS — W19XXXA Unspecified fall, initial encounter: Secondary | ICD-10-CM | POA: Diagnosis not present

## 2020-06-26 DIAGNOSIS — S32591A Other specified fracture of right pubis, initial encounter for closed fracture: Secondary | ICD-10-CM | POA: Diagnosis not present

## 2020-06-26 LAB — BASIC METABOLIC PANEL
Anion gap: 7 (ref 5–15)
BUN: 20 mg/dL (ref 8–23)
CO2: 27 mmol/L (ref 22–32)
Calcium: 8.7 mg/dL — ABNORMAL LOW (ref 8.9–10.3)
Chloride: 101 mmol/L (ref 98–111)
Creatinine, Ser: 0.85 mg/dL (ref 0.44–1.00)
GFR, Estimated: >60 mL/min (ref >60)
Glucose, Bld: 114 mg/dL — ABNORMAL HIGH (ref 70–99)
Potassium: 3.9 mmol/L (ref 3.5–5.1)
Sodium: 135 mmol/L (ref 135–145)

## 2020-06-26 LAB — CBC
HCT: 31.3 % — ABNORMAL LOW (ref 36.0–46.0)
Hemoglobin: 10.1 g/dL — ABNORMAL LOW (ref 12.0–15.0)
MCH: 30.1 pg (ref 26.0–34.0)
MCHC: 32.3 g/dL (ref 30.0–36.0)
MCV: 93.2 fL (ref 80.0–100.0)
Platelets: 255 10*3/uL (ref 150–400)
RBC: 3.36 MIL/uL — ABNORMAL LOW (ref 3.87–5.11)
RDW: 14.4 % (ref 11.5–15.5)
WBC: 8.3 10*3/uL (ref 4.0–10.5)
nRBC: 0 % (ref 0.0–0.2)

## 2020-06-26 MED ORDER — ACETAMINOPHEN 500 MG PO TABS
1000.0000 mg | ORAL_TABLET | Freq: Four times a day (QID) | ORAL | Status: DC
  Administered 2020-06-26 – 2020-06-30 (×17): 1000 mg via ORAL
  Filled 2020-06-26 (×17): qty 2

## 2020-06-26 MED ORDER — SODIUM CHLORIDE 0.9 % IV SOLN: INTRAVENOUS | Status: DC

## 2020-06-26 MED ORDER — ONDANSETRON HCL 4 MG/2ML IJ SOLN
4.0000 mg | Freq: Four times a day (QID) | INTRAMUSCULAR | Status: DC
  Administered 2020-06-26 – 2020-06-30 (×12): 4 mg via INTRAVENOUS
  Filled 2020-06-26 (×12): qty 2

## 2020-06-26 NOTE — Progress Notes (Signed)
- PROGRESS NOTE    Gabrielle Hoffman   NOM:[id number]  DOB: 12/27/1930  DOA: 06/23/2020 PCP: Lajean Manes, MD   Brief Narrative:  Gabrielle Hoffman is a 86 y.o. female  lives with her son with medical history significant of intermittent complete heart block status post pacemaker, low back pain, hypertension, dementia, systolic heart failure who presents following a fall at home.  Due to patient's dementia history obtained with the assistance of the patient's son at bedside and chart review.  Patient slipped and fell in her kitchen yesterday and landed on her right buttock and lower back area.  Per her son, she was not able to walk since the fall and he has had to put her in a wheelchair to get around.  She has significant pain if trying to move with pain located in her right hip and back.  She did not lose consciousness nor hit her head when she fell.    In ED: Hip and pelvis x-rays> Possible nondisplaced superior right and inferior right pubic rami fractures  lumbar spine x-ray: 1. Probable moderate acute superior endplate fracture at J62. Possible mild superior endplate deformity at L3   Orthopedic surgery and neurosurgery consulted in the ED.   Subjective: Has nausea- has not touched her breakfast this AM. Has pain in right upper posterior thigh.   Assessment & Plan:   Principal Problem: Possible multiple pelvic fractures on the right -Nonoperative management  - pain control -Lives at home with her son-PT recommends SNF and we working on a bed - d/c Norco and use scheduled Tylenol for pain (see below under "nausea")  Active Problems: Possible lumbar and thoracic fractures -Neuro-Surgery was consulted in the ED -Further imaging with a CT of the lumbar and thoracic spine revealed that there is no acute lumbar fracture and the T12 fracture is chronic   Nausea- acute on chronic with poor oral intake and weight loss - barely eating and drinking and is losing weight per her son-  started on Marinol by PCP  - nausea worse in hospital- oral intake very poor  - after discussion with son, it seems she may have GERD - 11/21>  started PPI QHS prior to dinner as most of her symptoms are at night - added Pepcid BID for now - cont to follow- barely eating half her meals and has poor oral liquid intake- have asked for strict I and O and encouraging oral intake - today will change Zofran to QID routine  - son said nausea started soon after she fell and prior to narcotics but will d/c Norco in case this is adding to her nausea- avoid NSAIDS-  - if no improvement, may need a GI eval tomorrow  Dehydration - BUN/Cr ratio rising daily- urine output is poor- start NS and cont to encourage oral intake- follow closely for fluid overload     Chronic systolic heart failure -Last echo in 2016 revealed an EF of 45 to 50% -Not on diuretics - euvolemic  Lung nodules with possibility of granulomatous disease -Noted on CT of the thoracic spine which was done without contrast> 71mm noncalcified right middle lobe nodule is indeterminate, although also suspected to be related to prior atypical/granulomatous infection.  Noncontrasted CT recommended in 6 to 12 months-please see official report below  Dementia -She is currently aware of where she is but does not know the month the year or her age  Hyperthyroidism - Continue home dose of methimazole  Anemia, normocytic -Hemoglobin has  dropped from 12.4-10.9  - stable today    Intermittent complete heart block status post pacemaker     Time spent in minutes: 35 DVT prophylaxis: Lovenox Code Status: Full code Family Communication:  Disposition Plan:  Status is: Inpatient- waiting for oral intake to improve  Dispo: The patient is from: Home              Anticipated d/c is to: SNF              Anticipated d/c date is: 2-3 days              Patient currently is not medically stable to d/c.   Consultants:   Orthopedic  surgery  Neurosurgery Procedures:   None Antimicrobials:  Anti-infectives (From admission, onward)   None       Objective: Vitals:   06/25/20 2004 06/26/20 0400 06/26/20 0823 06/26/20 0823  BP: (!) 124/56 (!) 133/58 (!) 140/55 (!) 140/55  Pulse: 97 84 88 88  Resp: 17 16 18 18   Temp: 97.9 F (36.6 C) 98.5 F (36.9 C) 97.8 F (36.6 C) 97.8 F (36.6 C)  TempSrc: Oral Oral Oral Oral  SpO2: 97% 98% 100% 100%  Weight:      Height:        Intake/Output Summary (Last 24 hours) at 06/26/2020 1200 Last data filed at 06/26/2020 0800 Gross per 24 hour  Intake 440 ml  Output 250 ml  Net 190 ml   Filed Weights   06/23/20 1658 06/25/20 0500  Weight: 48 kg 49 kg    Examination: General exam: Appears comfortable  HEENT: PERRLA, oral mucosa moist, no sclera icterus or thrush Respiratory system: Clear to auscultation. Respiratory effort normal. Cardiovascular system: S1 & S2 heard,  No murmurs  Gastrointestinal system: Abdomen soft, non-tender, nondistended. Normal bowel sounds   Central nervous system: Alert and oriented. No focal neurological deficits. Extremities: No cyanosis, clubbing or edema Skin: No rashes or ulcers Psychiatry:  Mood & affect appropriate.     Data Reviewed: I have personally reviewed following labs and imaging studies  CBC: Recent Labs  Lab 06/23/20 1715 06/24/20 0628 06/25/20 0850 06/26/20 0804  WBC 9.7 7.5 9.1 8.3  NEUTROABS 7.4  --   --   --   HGB 12.4 10.9* 10.3* 10.1*  HCT 38.8 33.9* 31.9* 31.3*  MCV 95.1 93.6 95.5 93.2  PLT 237 245 239 128   Basic Metabolic Panel: Recent Labs  Lab 06/23/20 1715 06/24/20 0628 06/25/20 0850 06/26/20 0804  NA 136 136 137 135  K 4.5 4.4 5.1 3.9  CL 101 102 102 101  CO2 20* 25 28 27   GLUCOSE 786* 767* 101* 114*  BUN 8 15 20 20   CREATININE 0.72 0.94 0.96 0.85  CALCIUM 9.4 9.0 9.2 8.7*   GFR: Estimated Creatinine Clearance: 36.1 mL/min (by C-G formula based on SCr of 0.85 mg/dL). Liver  Function Tests: Recent Labs  Lab 06/23/20 1715 06/24/20 0628  AST 28 19  ALT 21 16  ALKPHOS 85 69  BILITOT 1.1 1.0  PROT 7.3 6.4*  ALBUMIN 4.0 3.4*   No results for input(s): LIPASE, AMYLASE in the last 168 hours. No results for input(s): AMMONIA in the last 168 hours. Coagulation Profile: No results for input(s): INR, PROTIME in the last 168 hours. Cardiac Enzymes: Recent Labs  Lab 06/23/20 1715  CKTOTAL 104   BNP (last 3 results) No results for input(s): PROBNP in the last 8760 hours. HbA1C: No results for input(s): HGBA1C  in the last 72 hours. CBG: No results for input(s): GLUCAP in the last 168 hours. Lipid Profile: No results for input(s): CHOL, HDL, LDLCALC, TRIG, CHOLHDL, LDLDIRECT in the last 72 hours. Thyroid Function Tests: No results for input(s): TSH, T4TOTAL, FREET4, T3FREE, THYROIDAB in the last 72 hours. Anemia Panel: No results for input(s): VITAMINB12, FOLATE, FERRITIN, TIBC, IRON, RETICCTPCT in the last 72 hours. Urine analysis:    Component Value Date/Time   COLORURINE YELLOW 11/18/2015 1849   APPEARANCEUR CLEAR 11/18/2015 1849   LABSPEC 1.008 11/18/2015 1849   PHURINE 7.0 11/18/2015 1849   GLUCOSEU NEGATIVE 11/18/2015 1849   HGBUR NEGATIVE 11/18/2015 1849   BILIRUBINUR NEGATIVE 11/18/2015 1849   KETONESUR NEGATIVE 11/18/2015 1849   PROTEINUR NEGATIVE 11/18/2015 1849   UROBILINOGEN 0.2 06/17/2015 2338   NITRITE POSITIVE (A) 11/18/2015 1849   LEUKOCYTESUR MODERATE (A) 11/18/2015 1849   Sepsis Labs: @LABRCNTIP (procalcitonin:4,lacticidven:4) ) Recent Results (from the past 240 hour(s))  Respiratory Panel by RT PCR (Flu A&B, Covid) - Nasopharyngeal Swab     Status: None   Collection Time: 06/23/20  8:09 PM   Specimen: Nasopharyngeal Swab; Nasopharyngeal(NP) swabs in vial transport medium  Result Value Ref Range Status   SARS Coronavirus 2 by RT PCR NEGATIVE NEGATIVE Final    Comment: (NOTE) SARS-CoV-2 target nucleic acids are NOT  DETECTED.  The SARS-CoV-2 RNA is generally detectable in upper respiratoy specimens during the acute phase of infection. The lowest concentration of SARS-CoV-2 viral copies this assay can detect is 131 copies/mL. A negative result does not preclude SARS-Cov-2 infection and should not be used as the sole basis for treatment or other patient management decisions. A negative result may occur with  improper specimen collection/handling, submission of specimen other than nasopharyngeal swab, presence of viral mutation(s) within the areas targeted by this assay, and inadequate number of viral copies (<131 copies/mL). A negative result must be combined with clinical observations, patient history, and epidemiological information. The expected result is Negative.  Fact Sheet for Patients:  PinkCheek.be  Fact Sheet for Healthcare Providers:  GravelBags.it  This test is no t yet approved or cleared by the [place] FDA and  has been authorized for detection and/or diagnosis of SARS-CoV-2 by FDA under an Emergency Use Authorization (EUA). This EUA will remain  in effect (meaning this test can be used) for the duration of the COVID-19 declaration under Section 564(b)(1) of the Act, 21 U.S.C. section 360bbb-3(b)(1), unless the authorization is terminated or revoked sooner.     Influenza A by PCR NEGATIVE NEGATIVE Final   Influenza B by PCR NEGATIVE NEGATIVE Final    Comment: (NOTE) The Xpert Xpress SARS-CoV-2/FLU/RSV assay is intended as an aid in  the diagnosis of influenza from Nasopharyngeal swab specimens and  should not be used as a sole basis for treatment. Nasal washings and  aspirates are unacceptable for Xpert Xpress SARS-CoV-2/FLU/RSV  testing.  Fact Sheet for Patients: PinkCheek.be  Fact Sheet for Healthcare Providers: GravelBags.it  This test is not yet  approved or cleared by the [place] FDA and  has been authorized for detection and/or diagnosis of SARS-CoV-2 by  FDA under an Emergency Use Authorization (EUA). This EUA will remain  in effect (meaning this test can be used) for the duration of the  Covid-19 declaration under Section 564(b)(1) of the Act, 21  U.S.C. section 360bbb-3(b)(1), unless the authorization is  terminated or revoked. Performed at [facility], [place] [address] [place], [place] [address]  Radiology Studies: No results found.    Scheduled Meds: . acetaminophen  1,000 mg Oral QID  . enoxaparin (LOVENOX) injection  40 mg Subcutaneous Q24H  . famotidine  20 mg Oral BID  . feeding supplement  237 mL Oral TID BM  . methimazole  10 mg Oral Daily  . ondansetron (ZOFRAN) IV  4 mg Intravenous QID  . pantoprazole  40 mg Oral QAC supper  . sodium chloride flush  3 mL Intravenous Q12H   Continuous Infusions:   LOS: 2 days      Debbe Odea, MD [facility] Pager: [email or web address] 06/26/2020, 12:00 PM

## 2020-06-26 NOTE — Progress Notes (Signed)
Physical Therapy Treatment Patient Details Name: Gabrielle Hoffman MRN: [id number] DOB: 09/05/1930 Today's Date: 06/26/2020    History of Present Illness Gabrielle Hoffman is a 86 y.o. female with medical history significant of intermittent complete heart block status post pacemaker, low back pain, hypertension, dementia, systolic heart failure who presents following a fall at home, resulting in R non-displaced pubic rami fxs (non-op mgmnt, WBAT), and T12 compression fx (appears chronic, some bony retropulsion with mild stenosis)    PT Comments    Continuing work on functional mobility and activity tolerance;  Session focused on assist OOB to recliner; She was initially quite hesitant, but agreed after education re: benefits of mobilizing OOB; Still quite painful with transfers, but noteworthy power up effort given with sit to stand; Continue to recommend post-acute rehab to maximize independence and safety with mobility and ADLs with the ultimate goal of getting back home  Follow Up Recommendations  SNF     Equipment Recommendations  Rolling walker with 5" wheels;3in1 (PT)    Recommendations for Other Services       Precautions / Restrictions Precautions Precautions: Back;Fall;Other (comment) Precaution Comments: verbal discussion of log roll; pubic rami fxs Restrictions Weight Bearing Restrictions: Yes RLE Weight Bearing: Weight bearing as tolerated LLE Weight Bearing: Weight bearing as tolerated    Mobility  Bed Mobility Overal bed mobility: Needs Assistance Bed Mobility: Rolling;Sidelying to Sit Rolling: Mod assist;+2 for safety/equipment Sidelying to sit: Max assist       General bed mobility comments: Cues to initiate from hooklying position, and keep knees together to minimize pubic rami pain with movement  Transfers Overall transfer level: Needs assistance Equipment used: 2 person hand held assist Transfers: Sit to/from [employer] Transfers Sit to Stand: Mod  assist;+2 physical assistance Stand pivot transfers: Max assist;+2 physical assistance       General transfer comment: Noting good initiation with cues and support; Mod assist for rise to almost standing before required Max assist of 2 for support and to pivot due to pain; cried out in pain during pivot, but pain seems to subside once movement done  Ambulation/Gait                 Stairs             Wheelchair Mobility    Modified Rankin (Stroke Patients Only)       Balance Overall balance assessment: Needs assistance Sitting-balance support: Bilateral upper extremity supported;Feet supported Sitting balance-Leahy Scale: Fair       Standing balance-Leahy Scale: Zero (approaching Poor)                              Cognition Arousal/Alertness: Awake/alert Behavior During Therapy: WFL for tasks assessed/performed Overall Cognitive Status: Within Functional Limits for tasks assessed                                 General Comments: Though very internally distracted by pain when moving; difficulty attending to cues of any type      Exercises      General Comments General comments (skin integrity, edema, etc.): Very painful during movement, but pain subsides within 10-20 seconds once she settles      Pertinent Vitals/Pain Pain Assessment: Faces Faces Pain Scale: Hurts whole lot Pain Location: R groin with movement of RLE or Weight bearing Pain Descriptors / Indicators: Aching;Grimacing;Guarding;Crying  Pain Intervention(s): Monitored during session;Premedicated before session;Repositioned    Home Living                      Prior Function            PT Goals (current goals can now be found in the care plan section) Acute Rehab PT Goals Patient Stated Goal: to not hurt PT Goal Formulation: Patient unable to participate in goal setting Time For Goal Achievement: 07/08/20 Potential to Achieve Goals: Fair Progress  towards PT goals: Progressing toward goals (Slowly)    Frequency    Min 2X/week      PT Plan Current plan remains appropriate    Co-evaluation              AM-PAC PT "6 Clicks" Mobility   Outcome Measure  Help needed turning from your back to your side while in a flat bed without using bedrails?: A Little Help needed moving from lying on your back to sitting on the side of a flat bed without using bedrails?: A Lot Help needed moving to and from a bed to a chair (including a wheelchair)?: A Lot Help needed standing up from a chair using your arms (e.g., wheelchair or bedside chair)?: A Lot Help needed to walk in hospital room?: Total Help needed climbing 3-5 steps with a railing? : Total 6 Click Score: 11    End of Session Equipment Utilized During Treatment: Gait belt Activity Tolerance: Patient tolerated treatment well;Patient limited by pain Patient left: in chair;with call bell/phone within reach;with chair alarm set Nurse Communication: Mobility status (2 person assist back to bed) PT Visit Diagnosis: Unsteadiness on feet (R26.81);Other abnormalities of gait and mobility (R26.89);History of falling (Z91.81);Pain Pain - Right/Left: Right Pain - part of body: Hip (and groin)     Time: 1007-1219 PT Time Calculation (min) (ACUTE ONLY): 23 min  Charges:  $Therapeutic Activity: 23-37 mins                     Roney Marion, PT  Acute Rehabilitation Services Pager [phone] Office [phone]    [place] 06/26/2020, 10:51 AM

## 2020-06-26 NOTE — Plan of Care (Signed)
  Problem: Health Behavior/Discharge Planning: Goal: Ability to manage health-related needs will improve Outcome: Not Progressing   Problem: Activity: Goal: Risk for activity intolerance will decrease Outcome: Progressing   Problem: Pain Managment: Goal: General experience of comfort will improve Outcome: Progressing   

## 2020-06-26 NOTE — Progress Notes (Signed)
OT Cancellation Note  Patient Details Name: Gabrielle Hoffman MRN: [id number] DOB: [birth year]   Cancelled Treatment:    Reason Eval/Treat Not Completed: Patient declined, no reason specified. Pt reporting tender back pain and had just received tylenol. Pt politely declined OT session at this time but agreeable for session later. Will follow-up as time allows.  Layla Maw 06/26/2020, 12:52 PM

## 2020-06-27 ENCOUNTER — Inpatient Hospital Stay (HOSPITAL_COMMUNITY): Payer: Medicare Other

## 2020-06-27 DIAGNOSIS — S32591A Other specified fracture of right pubis, initial encounter for closed fracture: Secondary | ICD-10-CM | POA: Diagnosis not present

## 2020-06-27 LAB — BASIC METABOLIC PANEL
Anion gap: 8 (ref 5–15)
BUN: 17 mg/dL (ref 8–23)
CO2: 25 mmol/L (ref 22–32)
Calcium: 8.6 mg/dL — ABNORMAL LOW (ref 8.9–10.3)
Chloride: 103 mmol/L (ref 98–111)
Creatinine, Ser: 0.81 mg/dL (ref 0.44–1.00)
GFR, Estimated: >60 mL/min (ref >60)
Glucose, Bld: 128 mg/dL — ABNORMAL HIGH (ref 70–99)
Potassium: 4.1 mmol/L (ref 3.5–5.1)
Sodium: 136 mmol/L (ref 135–145)

## 2020-06-27 LAB — URINALYSIS, ROUTINE W REFLEX MICROSCOPIC
Bacteria, UA: NONE SEEN
Bilirubin Urine: NEGATIVE
Glucose, UA: NEGATIVE mg/dL
Hgb urine dipstick: NEGATIVE
Ketones, ur: NEGATIVE mg/dL
Nitrite: NEGATIVE
Protein, ur: NEGATIVE mg/dL
Specific Gravity, Urine: 1.004 — ABNORMAL LOW (ref 1.005–1.030)
pH: 7 (ref 5.0–8.0)

## 2020-06-27 MED ORDER — PROMETHAZINE HCL 25 MG/ML IJ SOLN
12.5000 mg | Freq: Four times a day (QID) | INTRAMUSCULAR | Status: DC | PRN
  Administered 2020-06-27 – 2020-06-30 (×2): 12.5 mg via INTRAVENOUS
  Filled 2020-06-27 (×2): qty 1

## 2020-06-27 MED ORDER — METOCLOPRAMIDE HCL 5 MG/ML IJ SOLN
5.0000 mg | Freq: Four times a day (QID) | INTRAMUSCULAR | Status: DC
  Administered 2020-06-27 – 2020-06-30 (×9): 5 mg via INTRAVENOUS
  Filled 2020-06-27 (×9): qty 2

## 2020-06-27 NOTE — Progress Notes (Addendum)
PROGRESS NOTE    Gabrielle Hoffman  PYP:[id number] DOB: [birth year] DOA: 06/23/2020 PCP: Lajean Manes, MD   Brief Narrative:  Gabrielle Hoffman is a 86 y.o.female lives with her son with medical history significant ofintermittent complete heart block status post pacemaker, low back pain, hypertension, dementia, systolic heart failure who presents following a fall at home. Due to patient's dementia history obtained with the assistance of the patient's son at bedside and chart review. Patient slipped and fell in her kitchen yesterday and landed on her right buttock and lower back area. Per her son, she was not able to walk since the fall and he has had to put her in a wheelchair to get around. She has significant pain if trying to move with pain located in her right hip and back. She did not lose consciousness nor hit her head when she fell.   In ED: Hip and pelvis x-rays> Possible nondisplaced superior right and inferior right pubic rami fractures lumbar spine x-ray: 1. Probable moderate acute superior endplate fracture at I45. Possible mild superior endplate deformity at L3 Orthopedic surgery and neurosurgery consulted in the ED.  Assessment & Plan:  Possible multiple pelvic fractures on the right: -Orthopedic recommended nonoperative management. -Pain control. -PT/OT-recommended SNF -Tylenol as needed for pain control.  Norco was discontinued due to persistent nausea  Possible lumbar and thoracic fractures: -Neurosurgery was consulted in the ED. -Further imaging with CT of lumbar and thoracic spine revealed that there is no acute lumbar fracture and the T12 fracture is chronic. -Continue with pain medication.  Tylenol as needed.  Continue PT/OT recommended SNF.  Persistent nausea: -Worsening recently as per son. -Zofran 4 times daily is not helpful -Continue PPI.  Added Phenergan as needed and Reglan 5 mg every 6 hours. -Norco discontinued due to persistent nausea. -order  barium swallow.  Dehydration: -In the setting of decrease IV fluid continue with IV fluids.  Chronic systolic CHF: Ejection fraction of 45 to 50% from last echo in 2016.  Patient appears euvolemic.  Not on diuretics.  Strict INO's and daily weight.  Monitor signs for fluid overload.  Lung nodules with possibility of granulomatous disease: -Noted on CT of thoracic spine. -Noncontrast CT recommended in 6 to 12 months outpatient  Dementia: At baseline  Hyperthyroidism: Continue methimazole  Normocytic anemia: At baseline.  Continue to monitor  History of complete heart block status post pacemaker placement: Continue to monitor on telemetry.  DVT prophylaxis: Lovenox Code Status: Full code Family Communication:  None present at bedside.  Plan of care discussed with patient in length and she verbalized understanding and agreed with it.  I called patient's son and discussed plan of care and he verbalized understanding.  Answered all of his questions.  Disposition Plan: SNF  Consultants:   Orthopedic surgery  Neurosurgery  Procedures:   CT thoracic and lumbar spine  Antimicrobials:   None  Status is: Inpatient  Dispo: The patient is from: Home              Anticipated d/c is to: SNF              Anticipated d/c date is: 2 days              Patient currently is not medically stable to d/c.   Subjective: Patient seen and examined.  Tells me that she continues to have nausea and has pain in her right hip.  Tells me that she is not feeling well overall.  Objective: Vitals:   06/27/20 0445 06/27/20 0500 06/27/20 0832 06/27/20 1443  BP: (!) 164/88  (!) 164/75 (!) 188/80  Pulse: (!) 105  100 (!) 125  Resp: 16  16 19   Temp: 97.9 F (36.6 C)  98.6 F (37 C) 97.9 F (36.6 C)  TempSrc: Oral  Oral Oral  SpO2: 90%  99% 99%  Weight:  46.4 kg    Height:        Intake/Output Summary (Last 24 hours) at 06/27/2020 1605 Last data filed at 06/27/2020 1300 Gross per 24 hour    Intake 2161.79 ml  Output 1000 ml  Net 1161.79 ml   Filed Weights   06/23/20 1658 06/25/20 0500 06/27/20 0500  Weight: 48 kg 49 kg 46.4 kg    Examination:  General exam: Appears calm and comfortable, on room air, appears dehydrated, and pain Respiratory system: Clear to auscultation. Respiratory effort normal. Cardiovascular system: S1 & S2 heard, RRR. No JVD, murmurs, rubs, gallops or clicks. No pedal edema. Gastrointestinal system: Abdomen is nondistended, soft and nontender. No organomegaly or masses felt. Normal bowel sounds heard. Central nervous system: Alert and oriented. No focal neurological deficits. Extremities: Symmetric 5 x 5 power. Skin: No rashes, lesions or ulcers Psychiatry: Judgement and insight appear normal. Mood & affect appropriate.    Data Reviewed: I have personally reviewed following labs and imaging studies  CBC: Recent Labs  Lab 06/23/20 1715 06/24/20 0628 06/25/20 0850 06/26/20 0804  WBC 9.7 7.5 9.1 8.3  NEUTROABS 7.4  --   --   --   HGB 12.4 10.9* 10.3* 10.1*  HCT 38.8 33.9* 31.9* 31.3*  MCV 95.1 93.6 95.5 93.2  PLT 237 245 239 924   Basic Metabolic Panel: Recent Labs  Lab 06/23/20 1715 06/24/20 0628 06/25/20 0850 06/26/20 0804 06/27/20 0248  NA 136 136 137 135 136  K 4.5 4.4 5.1 3.9 4.1  CL 101 102 102 101 103  CO2 20* 25 28 27 25   GLUCOSE 112* 109* 101* 114* 128*  BUN 8 15 20 20 17   CREATININE 0.72 0.94 0.96 0.85 0.81  CALCIUM 9.4 9.0 9.2 8.7* 8.6*   GFR: Estimated Creatinine Clearance: 35.8 mL/min (by C-G formula based on SCr of 0.81 mg/dL). Liver Function Tests: Recent Labs  Lab 06/23/20 1715 06/24/20 0628  AST 28 19  ALT 21 16  ALKPHOS 85 69  BILITOT 1.1 1.0  PROT 7.3 6.4*  ALBUMIN 4.0 3.4*   No results for input(s): LIPASE, AMYLASE in the last 168 hours. No results for input(s): AMMONIA in the last 168 hours. Coagulation Profile: No results for input(s): INR, PROTIME in the last 168 hours. Cardiac  Enzymes: Recent Labs  Lab 06/23/20 1715  CKTOTAL 104   BNP (last 3 results) No results for input(s): PROBNP in the last 8760 hours. HbA1C: No results for input(s): HGBA1C in the last 72 hours. CBG: No results for input(s): GLUCAP in the last 168 hours. Lipid Profile: No results for input(s): CHOL, HDL, LDLCALC, TRIG, CHOLHDL, LDLDIRECT in the last 72 hours. Thyroid Function Tests: No results for input(s): TSH, T4TOTAL, FREET4, T3FREE, THYROIDAB in the last 72 hours. Anemia Panel: No results for input(s): VITAMINB12, FOLATE, FERRITIN, TIBC, IRON, RETICCTPCT in the last 72 hours. Sepsis Labs: No results for input(s): PROCALCITON, LATICACIDVEN in the last 168 hours.  Recent Results (from the past 240 hour(s))  Respiratory Panel by RT PCR (Flu A&B, Covid) - Nasopharyngeal Swab     Status: None   Collection Time: 06/23/20  8:09 PM   Specimen: Nasopharyngeal Swab; Nasopharyngeal(NP) swabs in vial transport medium  Result Value Ref Range Status   SARS Coronavirus 2 by RT PCR NEGATIVE NEGATIVE Final    Comment: (NOTE) SARS-CoV-2 target nucleic acids are NOT DETECTED.  The SARS-CoV-2 RNA is generally detectable in upper respiratoy specimens during the acute phase of infection. The lowest concentration of SARS-CoV-2 viral copies this assay can detect is 131 copies/mL. A negative result does not preclude SARS-Cov-2 infection and should not be used as the sole basis for treatment or other patient management decisions. A negative result may occur with  improper specimen collection/handling, submission of specimen other than nasopharyngeal swab, presence of viral mutation(s) within the areas targeted by this assay, and inadequate number of viral copies (<131 copies/mL). A negative result must be combined with clinical observations, patient history, and epidemiological information. The expected result is Negative.  Fact Sheet for Patients:   PinkCheek.be  Fact Sheet for Healthcare Providers:  GravelBags.it  This test is no t yet approved or cleared by the [place] FDA and  has been authorized for detection and/or diagnosis of SARS-CoV-2 by FDA under an Emergency Use Authorization (EUA). This EUA will remain  in effect (meaning this test can be used) for the duration of the COVID-19 declaration under Section 564(b)(1) of the Act, 21 U.S.C. section 360bbb-3(b)(1), unless the authorization is terminated or revoked sooner.     Influenza A by PCR NEGATIVE NEGATIVE Final   Influenza B by PCR NEGATIVE NEGATIVE Final    Comment: (NOTE) The Xpert Xpress SARS-CoV-2/FLU/RSV assay is intended as an aid in  the diagnosis of influenza from Nasopharyngeal swab specimens and  should not be used as a sole basis for treatment. Nasal washings and  aspirates are unacceptable for Xpert Xpress SARS-CoV-2/FLU/RSV  testing.  Fact Sheet for Patients: PinkCheek.be  Fact Sheet for Healthcare Providers: GravelBags.it  This test is not yet approved or cleared by the [place] FDA and  has been authorized for detection and/or diagnosis of SARS-CoV-2 by  FDA under an Emergency Use Authorization (EUA). This EUA will remain  in effect (meaning this test can be used) for the duration of the  Covid-19 declaration under Section 564(b)(1) of the Act, 21  U.S.C. section 360bbb-3(b)(1), unless the authorization is  terminated or revoked. Performed at [facility], [place] [address], [place], [place] [address]       Radiology Studies: No results found.  Scheduled Meds: . acetaminophen  1,000 mg Oral QID  . enoxaparin (LOVENOX) injection  40 mg Subcutaneous Q24H  . famotidine  20 mg Oral BID  . feeding supplement  237 mL Oral TID BM  . methimazole  10 mg Oral Daily  . ondansetron (ZOFRAN) IV  4 mg Intravenous QID  .  pantoprazole  40 mg Oral QAC supper  . sodium chloride flush  3 mL Intravenous Q12H   Continuous Infusions: . sodium chloride 75 mL/hr at 06/26/20 1241     LOS: 3 days   Time spent: 40 minutes.   Mckinley Jewel, MD [facility]  If 7PM-7AM, please contact night-coverage [email or web address] 06/27/2020, 4:05 PM

## 2020-06-27 NOTE — Plan of Care (Signed)
Patient is a very pleasant lady. Yellow MEWS today on day shift-still on Q4H vital signs but is now in the [place]. Patient is not having any complaints of pain or nausea at this time. Bed alarm and yellow socks on due to high fall risk precautions. Son, [name] had questions that were answered to the best of my ability before he left after visiting hours. Plan for patient is to go to [facility] in [place], [place] once medically cleared for discharge. Will continue to monitor and continue current POC.

## 2020-06-27 NOTE — Plan of Care (Signed)
Pt has been picking at right arm leaving small open pick areas. Pt's son says this is not a new behavior.   Problem: Activity: Goal: Risk for activity intolerance will decrease Outcome: Progressing   Problem: Pain Managment: Goal: General experience of comfort will improve Outcome: Progressing

## 2020-06-27 NOTE — Progress Notes (Addendum)
Occupational Therapy Treatment Patient Details Name: Gabrielle Hoffman MRN: [id number] DOB: [birth year] Today's Date: 06/27/2020    History of present illness Gabrielle Hoffman is a 86 y.o. female with medical history significant of intermittent complete heart block status post pacemaker, low back pain, hypertension, dementia, systolic heart failure who presents following a fall at home, resulting in R non-displaced pubic rami fxs (non-op mgmnt, WBAT), and T12 compression fx (appears chronic, some bony retropulsion with mild stenosis)   OT comments  OT treatment session with focus on self-care re-education, bed mobility, functional transfers, and pain management. Patient is very pain focused shouting in pain with very minimal movement. Patient able to transition from supine to side-lying and side-lying to EOB with Min A +2. Sit to stand from slightly elevated EOB with Min A +2 and multimodal cues to correct hip flexion. Patient continues to be limited by pain in R hip making ADLs, functional transfers, and mobility a hardship. Patient would benefit from continued acute OT services.    Follow Up Recommendations  SNF;Supervision/Assistance - 24 hour    Equipment Recommendations  Other (comment) (Please defer to next level of care)    Recommendations for Other Services      Precautions / Restrictions Precautions Precautions: Back;Fall;Other (comment) Precaution Comments: verbal discussion of log roll; pubic rami fxs Restrictions Weight Bearing Restrictions: Yes RLE Weight Bearing: Weight bearing as tolerated LLE Weight Bearing: Weight bearing as tolerated       Mobility Bed Mobility Overal bed mobility: Needs Assistance Bed Mobility: Rolling;Sidelying to Sit Rolling: Mod assist;+2 for safety/equipment Sidelying to sit: Max assist       General bed mobility comments: Cues to initiate from hooklying position, and keep knees together to minimize pubic rami pain with  movement  Transfers Overall transfer level: Needs assistance Equipment used: Rolling walker (2 wheeled) Transfers: Sit to/from [facility] Sit to Stand: Mod assist;+2 safety/equipment Stand pivot transfers: Mod assist;+2 safety/equipment       General transfer comment: Patient with slightly flexed posture requiring multimodal cues to stand upright. Stand-pivot transfer to recliner on L with Mod A +2 for walker management. Would likely require +1 assist if walker were right fit.     Balance Overall balance assessment: Needs assistance Sitting-balance support: Bilateral upper extremity supported;Feet supported Sitting balance-Leahy Scale: Fair       Standing balance-Leahy Scale: Poor (Heavy reliance on BUE on RW)                             ADL either performed or assessed with clinical judgement   ADL       Grooming: Set up;Sitting                                       Vision       Perception     Praxis      Cognition Arousal/Alertness: Awake/alert Behavior During Therapy: WFL for tasks assessed/performed Overall Cognitive Status: Within Functional Limits for tasks assessed                                 General Comments: Pain focused        Exercises     Shoulder Instructions       General Comments Patient is very pain focused  screaming in pain with minimal movement.     Pertinent Vitals/ Pain       Pain Assessment: Faces Faces Pain Scale: Hurts even more Pain Location: R groin with movement of RLE or Weight bearing Pain Descriptors / Indicators: Aching;Grimacing;Guarding;Crying Pain Intervention(s): Monitored during session;Repositioned;Premedicated before session  Home Living                                          Prior Functioning/Environment              Frequency  Min 2X/week        Progress Toward Goals  OT Goals(current goals can now be found in the  care plan section)  Progress towards OT goals: Progressing toward goals  Acute Rehab OT Goals Patient Stated Goal: To decrease pain OT Goal Formulation: With patient Time For Goal Achievement: 07/08/20 Potential to Achieve Goals: Good ADL Goals Pt Will Perform Grooming: with min guard assist;standing Pt Will Perform Lower Body Dressing: with min guard assist;sit to/from stand Pt Will Transfer to Toilet: with min guard assist;ambulating;bedside commode Pt/caregiver will Perform Home Exercise Program: Increased strength;Both right and left upper extremity;With Supervision  Plan Discharge plan remains appropriate    Co-evaluation                 AM-PAC OT "6 Clicks" Daily Activity     Outcome Measure   Help from another person eating meals?: A Little Help from another person taking care of personal grooming?: A Little Help from another person toileting, which includes using toliet, bedpan, or urinal?: A Lot Help from another person bathing (including washing, rinsing, drying)?: A Lot Help from another person to put on and taking off regular upper body clothing?: A Lot Help from another person to put on and taking off regular lower body clothing?: A Lot 6 Click Score: 14    End of Session Equipment Utilized During Treatment: Gait belt;Rolling walker  OT Visit Diagnosis: Unsteadiness on feet (R26.81);Muscle weakness (generalized) (M62.81);Pain Pain - Right/Left: Right Pain - part of body: Hip   Activity Tolerance Patient tolerated treatment well;Patient limited by pain   Patient Left in chair;with call bell/phone within reach;with chair alarm set   Nurse Communication Mobility status        Time: 2440-1027 OT Time Calculation (min): 23 min  Charges: OT General Charges $OT Visit: 1 Visit OT Treatments $Self Care/Home Management : 8-22 mins $Therapeutic Activity: 8-22 mins  Pharrah Rottman H. OTR/L Supplemental OT, Department of rehab services  [phone]   Deeann Saint R [place]Davis 06/27/2020, 10:07 AM

## 2020-06-27 NOTE — Progress Notes (Signed)
Physical Therapy Treatment Patient Details Name: Gabrielle Hoffman MRN: [id number] DOB: 1931/07/29 Today's Date: 06/27/2020    History of Present Illness Gabrielle Hoffman is a 86 y.o. female with medical history significant of intermittent complete heart block status post pacemaker, low back pain, hypertension, dementia, systolic heart failure who presents following a fall at home, resulting in R non-displaced pubic rami fxs (non-op mgmnt, WBAT), and T12 compression fx (appears chronic, some bony retropulsion with mild stenosis)    PT Comments    Continuing work on functional mobility and activity tolerance;  Very painful with movement and transfers; Lots of difficulty with pivot steps, and needed BSC to be brought to her; Opted for use of the stedy to transfer BSC back to bed; continue to recommend SNF   Follow Up Recommendations  SNF     Equipment Recommendations  Rolling walker with 5" wheels;3in1 (PT)    Recommendations for Other Services OT consult (as ordered)     Precautions / Restrictions Precautions Precautions: Back;Fall;Other (comment) Precaution Comments: verbal discussion of log roll; pubic rami fxs Restrictions RLE Weight Bearing: Weight bearing as tolerated LLE Weight Bearing: Weight bearing as tolerated    Mobility  Bed Mobility Overal bed mobility: Needs Assistance Bed Mobility: Sit to Supine       Sit to supine: Max assist   General bed mobility comments: Was in pain and exhausted by the end of session; Max assist to help LEs into bed; painful  Transfers Overall transfer level: Needs assistance Equipment used: Rolling walker (2 wheeled) Transfers: Sit to/from [facility] Sit to Stand: Mod assist;Max assist Stand pivot transfers: Mod assist;+2 physical assistance;Max assist       General transfer comment: Heavy mod assist to power up to stand to RW and in [place]; Pain had come on with standing, and pt was unable to take steps recliner  to [facility] without signifcant sinking into hip, trunk, and knee flexion, eventually needing Max assist and to pull the [facility] to her  Ambulation/Gait                 Stairs             Wheelchair Mobility    Modified Rankin (Stroke Patients Only)       Balance     Sitting balance-Leahy Scale: Fair       Standing balance-Leahy Scale: Poor (Heavy reliance on BUE on RW)                              Cognition Arousal/Alertness: Awake/alert Behavior During Therapy: WFL for tasks assessed/performed Overall Cognitive Status: Within Functional Limits for tasks assessed                                 General Comments: Pain focused      Exercises      General Comments General comments (skin integrity, edema, etc.): Patient is very pain focused screaming in pain with minimal movement.       Pertinent Vitals/Pain Pain Assessment: Faces Faces Pain Scale: Hurts whole lot Pain Location: R groin with movement of RLE or Weight bearing Pain Descriptors / Indicators: Aching;Grimacing;Guarding;Crying Pain Intervention(s): Monitored during session;Repositioned;RN gave pain meds during session    Home Living                      Prior  Function            PT Goals (current goals can now be found in the care plan section) Acute Rehab PT Goals Patient Stated Goal: To decrease pain PT Goal Formulation: Patient unable to participate in goal setting Time For Goal Achievement: 07/08/20 Potential to Achieve Goals: Fair Progress towards PT goals: Progressing toward goals    Frequency    Min 2X/week      PT Plan Current plan remains appropriate    Co-evaluation              AM-PAC PT "6 Clicks" Mobility   Outcome Measure  Help needed turning from your back to your side while in a flat bed without using bedrails?: A Little Help needed moving from lying on your back to sitting on the side of a flat bed without using bedrails?:  A Lot Help needed moving to and from a bed to a chair (including a wheelchair)?: A Lot Help needed standing up from a chair using your arms (e.g., wheelchair or bedside chair)?: A Lot Help needed to walk in hospital room?: Total Help needed climbing 3-5 steps with a railing? : Total 6 Click Score: 11    End of Session Equipment Utilized During Treatment: Gait belt Activity Tolerance: Patient limited by pain Patient left: in bed;with call bell/phone within reach;with nursing/sitter in room Nurse Communication: Mobility status PT Visit Diagnosis: Unsteadiness on feet (R26.81);Other abnormalities of gait and mobility (R26.89);History of falling (Z91.81);Pain Pain - Right/Left: Right Pain - part of body: Hip (and groin)     Time: 1321-1340 PT Time Calculation (min) (ACUTE ONLY): 19 min  Charges:  $Therapeutic Activity: 8-22 mins                     Roney Marion, PT  Acute Rehabilitation Services Pager [phone] Office [phone]    Colletta [place] 06/27/2020, 6:09 PM

## 2020-06-28 ENCOUNTER — Inpatient Hospital Stay (HOSPITAL_COMMUNITY): Payer: Medicare Other

## 2020-06-28 DIAGNOSIS — S32591A Other specified fracture of right pubis, initial encounter for closed fracture: Secondary | ICD-10-CM | POA: Diagnosis not present

## 2020-06-28 LAB — CBC
HCT: 28.9 % — ABNORMAL LOW (ref 36.0–46.0)
Hemoglobin: 9.2 g/dL — ABNORMAL LOW (ref 12.0–15.0)
MCH: 30.5 pg (ref 26.0–34.0)
MCHC: 31.8 g/dL (ref 30.0–36.0)
MCV: 95.7 fL (ref 80.0–100.0)
Platelets: 297 10*3/uL (ref 150–400)
RBC: 3.02 MIL/uL — ABNORMAL LOW (ref 3.87–5.11)
RDW: 14.4 % (ref 11.5–15.5)
WBC: 6.8 10*3/uL (ref 4.0–10.5)
nRBC: 0 % (ref 0.0–0.2)

## 2020-06-28 MED ORDER — INFLUENZA VAC A&B SA ADJ QUAD 0.5 ML IM PRSY
0.5000 mL | PREFILLED_SYRINGE | INTRAMUSCULAR | Status: DC
  Filled 2020-06-28: qty 0.5

## 2020-06-28 MED ORDER — DOCUSATE SODIUM 100 MG PO CAPS
100.0000 mg | ORAL_CAPSULE | Freq: Every day | ORAL | Status: DC
  Administered 2020-06-28 – 2020-06-30 (×3): 100 mg via ORAL
  Filled 2020-06-28 (×3): qty 1

## 2020-06-28 MED ORDER — POLYETHYLENE GLYCOL 3350 17 G PO PACK
17.0000 g | PACK | Freq: Every day | ORAL | Status: DC
  Administered 2020-06-28 – 2020-06-30 (×3): 17 g via ORAL
  Filled 2020-06-28 (×3): qty 1

## 2020-06-28 NOTE — Evaluation (Signed)
Clinical/Bedside Swallow Evaluation Patient Details  Name: Gabrielle Hoffman MRN: [id number] Date of Birth: August 08, 1930  Today's Date: 06/28/2020 Time: SLP Start Time (ACUTE ONLY): 74 SLP Stop Time (ACUTE ONLY): 0956 SLP Time Calculation (min) (ACUTE ONLY): 11 min  Past Medical History:  Past Medical History:  Diagnosis Date   Asthma    DDD (degenerative disc disease), lumbar    Dementia    very mild    Depression    Disc disorder of cervical region    Diverticulosis    Hyperlipidemia    Hypertension    Hyponatremia 12/17/2011   Kidney stones    "passed them"   Memory loss    Mitral valve prolapse    Osteopenia    Pneumonia    "I believe I had it a long time ago"   Presence of permanent cardiac pacemaker    Syncope    UTI (lower urinary tract infection)    Vaginal prolapse    Venous insufficiency    Past Surgical History:  Past Surgical History:  Procedure Laterality Date   ABDOMINAL HYSTERECTOMY     APPENDECTOMY     EP IMPLANTABLE DEVICE N/A 08/27/2015   Procedure: Loop Recorder Insertion;  Surgeon: Evans Lance, MD;  Location: [facility];  Service: Cardiovascular;  Laterality: N/A;   EP IMPLANTABLE DEVICE N/A 11/19/2015   Procedure: Pacemaker Implant;  Surgeon: Evans Lance, MD;  Location: [place] [facility];  Service: Cardiovascular;  Laterality: N/A;   INSERT / REPLACE / REMOVE PACEMAKER  11/19/2015   TONSILLECTOMY     HPI:  Gabrielle Hoffman is a 86 y.o. female with medical history significant of intermittent complete heart block status post pacemaker, low back pain, hypertension, dementia, systolic heart failure who presents following a fall at home, resulting in R non-displaced pubic rami fxs (non-op mgmnt, WBAT), and T12 compression fx (appears chronic, some bony retropulsion with mild stenosis) .  Recent complaints of nausea.   Assessment / Plan / Recommendation Clinical Impression  Pt participated in limited swallow  assessment while eating breakfast, c/o nausea.  Oral mechanism exam was WNL.  Pt was willing to eat a cracker, and sip some water and coffee. There was active mastication, brisk swallow per palpation, no s/s of aspiration, no s/s of pharyngeal residue. There were no reports of globus, backflow nor other specific esophageal complaints.  No s/s of oropharyngeal dysphagia as contributing to pt's current diagnosis.  Continue current diet as tolerated.  Our service will sign off. SLP Visit Diagnosis: Dysphagia, unspecified (R13.10)    Aspiration Risk  No limitations    Diet Recommendation   regular solids, thin liquids  Medication Administration: Whole meds with liquid    Other  Recommendations Oral Care Recommendations: Oral care BID   Follow up Recommendations None        Swallow Study   General HPI: Gabrielle Hoffman is a 86 y.o. female with medical history significant of intermittent complete heart block status post pacemaker, low back pain, hypertension, dementia, systolic heart failure who presents following a fall at home, resulting in R non-displaced pubic rami fxs (non-op mgmnt, WBAT), and T12 compression fx (appears chronic, some bony retropulsion with mild stenosis) Type of Study: Bedside Swallow Evaluation Previous Swallow Assessment: no Diet Prior to this Study: Regular;Thin liquids Temperature Spikes Noted: No Respiratory Status: Room air History of Recent Intubation: No Behavior/Cognition: Alert;Cooperative Oral Cavity Assessment: Within Functional Limits Oral Care Completed by SLP: No Oral Cavity - Dentition:  Adequate natural dentition Vision: Functional for self-feeding Self-Feeding Abilities: Able to feed self Patient Positioning: Upright in bed Baseline Vocal Quality: Normal Volitional Cough: Strong Volitional Swallow: Able to elicit    Oral/Motor/Sensory Function Overall Oral Motor/Sensory Function: Within functional limits   Ice Chips Ice chips: Within functional  limits   Thin Liquid Thin Liquid: Within functional limits    Nectar Thick Nectar Thick Liquid: Not tested   Honey Thick Honey Thick Liquid: Not tested   Puree Puree: Not tested   Solid     Solid: Within functional limits      Gabrielle Hoffman 06/28/2020,9:56 AM   Gabrielle Hoffman, Gabrielle [place] Office number [phone] Pager [phone]

## 2020-06-28 NOTE — TOC Progression Note (Signed)
Transition of Care [facility]) - Progression Note    Patient Details  Name: Gabrielle Hoffman MRN: [id number] Date of Birth: 1930/08/22  Transition of Care [facility]) CM/SW [place], [place] Phone Number: 06/28/2020, 10:58 AM  Clinical Narrative:     CSW called [facility]( [facility])- left voice massage to return call.   Thurmond Butts, MSW, [place] Clinical Social Worker   Expected Discharge Plan: Skilled Nursing Facility Barriers to Discharge: Continued Medical Work up  Expected Discharge Plan and Services Expected Discharge Plan: [place]   Discharge Planning Services: CM Consult Post Acute Care Choice: [place] Living arrangements for the past 2 months: [place]                                       Social Determinants of Health (SDOH) Interventions    Readmission Risk Interventions Readmission Risk Prevention Plan 06/25/2020  Post Dischage Appt Complete  Medication Screening Complete  Transportation Screening Complete  Some recent data might be hidden

## 2020-06-28 NOTE — Progress Notes (Addendum)
PROGRESS NOTE    Gabrielle Hoffman  FVC:[id number] DOB: 1931/05/03 DOA: 06/23/2020 PCP: Lajean Manes, MD   Brief Narrative:  Gabrielle Hoffman is a 86 y.o.female lives with her son with medical history significant ofintermittent complete heart block status post pacemaker, low back pain, hypertension, dementia, systolic heart failure who presents following a fall at home. Due to patient's dementia history obtained with the assistance of the patient's son at bedside and chart review. Patient slipped and fell in her kitchen yesterday and landed on her right buttock and lower back area. Per her son, she was not able to walk since the fall and he has had to put her in a wheelchair to get around. She has significant pain if trying to move with pain located in her right hip and back. She did not lose consciousness nor hit her head when she fell.   In ED: Hip and pelvis x-rays> Possible nondisplaced superior right and inferior right pubic rami fractures lumbar spine x-ray: 1. Probable moderate acute superior endplate fracture at M38. Possible mild superior endplate deformity at L3 Orthopedic surgery and neurosurgery consulted in the ED.  Assessment & Plan:  Possible multiple pelvic fractures on the right: -Orthopedic recommended nonoperative management. -Pain control. -PT/OT-recommended SNF -Tylenol as needed for pain control.  Norco was discontinued due to persistent nausea  Possible lumbar and thoracic fractures: -Neurosurgery was consulted in the ED. -Further imaging with CT of lumbar and thoracic spine revealed that there is no acute lumbar fracture and the T12 fracture is chronic. -Continue with pain medication.  Tylenol as needed.  Continue PT/OT recommended SNF.  Persistent nausea: -Worsening recently as per son. -Zofran 4 times daily is not helpful -Continue PPI. Cont. Phenergan as needed and Reglan 5 mg every 6 hours. -Norco discontinued due to persistent nausea. -Patient  symptoms improved this morning. Evaluated by speech therapy as well and recommended regular solids, thin liquids. -Barium swallow was ordered-not done due to patient's positioning due to pain.  Dehydration: -In the setting of decrease PO intake. Cont. IV fluid   Chronic systolic CHF: Ejection fraction of 45 to 50% from last echo in 2016.  Patient appears euvolemic.  Not on diuretics.  Strict INO's and daily weight.  Monitor signs for fluid overload.  Lung nodules with possibility of granulomatous disease: -Noted on CT of thoracic spine. -Noncontrast CT recommended in 6 to 12 months outpatient  Dementia: At baseline  Hyperthyroidism: Continue methimazole  Normocytic anemia: At baseline.  Continue to monitor  History of complete heart block status post pacemaker placement: Continue to monitor on telemetry.  DVT prophylaxis: Lovenox Code Status: Full code Family Communication:  None present at bedside.  Plan of care discussed with patient in length and she verbalized understanding and agreed with it.  Talked to patient's son [name]-discussed about plan of care & he verbalized understanding.   Disposition Plan: SNF  Consultants:   Orthopedic surgery  Neurosurgery  Procedures:   CT thoracic and lumbar spine  Antimicrobials:   None  Status is: Inpatient  Dispo: The patient is from: Home              Anticipated d/c is to: SNF              Anticipated d/c date is: 06/29/2020              Patient currently is not medically stable to d/c.   Subjective: Patient seen and examined. Resting comfortably on the bed. Tells me that  overall she feels better this morning. Her nausea has improved. Denies abdominal pain, chest pain, shortness of breath, leg swelling.  Objective: Vitals:   06/28/20 0229 06/28/20 0500 06/28/20 0834 06/28/20 1355  BP: (!) 126/59  (!) 151/76 (!) 174/56  Pulse: 88  97 98  Resp: 18  18 16   Temp: 98.8 F (37.1 C)  98.1 F (36.7 C) 98.1 F (36.7 C)    TempSrc: Oral  Oral Oral  SpO2: 98%  99% 100%  Weight:  48.6 kg    Height:        Intake/Output Summary (Last 24 hours) at 06/28/2020 1416 Last data filed at 06/28/2020 1300 Gross per 24 hour  Intake 1029.12 ml  Output 1400 ml  Net -370.88 ml   Filed Weights   06/25/20 0500 06/27/20 0500 06/28/20 0500  Weight: 49 kg 46.4 kg 48.6 kg    Examination:  General exam: Appears calm and comfortable, on room air, communicating well Respiratory system: Clear to auscultation. Respiratory effort normal. Cardiovascular system: S1 & S2 heard, RRR. No JVD, murmurs, rubs, gallops or clicks. No pedal edema. Gastrointestinal system: Abdomen is nondistended, soft and nontender. No organomegaly or masses felt. Normal bowel sounds heard. Central nervous system: Alert and oriented. No focal neurological deficits. Extremities: Symmetric 5 x 5 power. Skin: No rashes, lesions or ulcers. Psychiatry: Judgement and insight appear normal. Mood & affect appropriate.   Data Reviewed: I have personally reviewed following labs and imaging studies  CBC: Recent Labs  Lab 06/23/20 1715 06/24/20 0628 06/25/20 0850 06/26/20 0804 06/28/20 0055  WBC 9.7 7.5 9.1 8.3 6.8  NEUTROABS 7.4  --   --   --   --   HGB 12.4 10.9* 10.3* 10.1* 9.2*  HCT 38.8 33.9* 31.9* 31.3* 28.9*  MCV 95.1 93.6 95.5 93.2 95.7  PLT 237 245 239 255 063   Basic Metabolic Panel: Recent Labs  Lab 06/23/20 1715 06/24/20 0628 06/25/20 0850 06/26/20 0804 06/27/20 0248  NA 136 136 137 135 136  K 4.5 4.4 5.1 3.9 4.1  CL 101 102 102 101 103  CO2 20* 25 28 27 25   GLUCOSE 112* 109* 101* 114* 128*  BUN 8 15 20 20 17   CREATININE 0.72 0.94 0.96 0.85 0.81  CALCIUM 9.4 9.0 9.2 8.7* 8.6*   GFR: Estimated Creatinine Clearance: 37.5 mL/min (by C-G formula based on SCr of 0.81 mg/dL). Liver Function Tests: Recent Labs  Lab 06/23/20 1715 06/24/20 0628  AST 28 19  ALT 21 16  ALKPHOS 85 69  BILITOT 1.1 1.0  PROT 7.3 6.4*  ALBUMIN  4.0 3.4*   No results for input(s): LIPASE, AMYLASE in the last 168 hours. No results for input(s): AMMONIA in the last 168 hours. Coagulation Profile: No results for input(s): INR, PROTIME in the last 168 hours. Cardiac Enzymes: Recent Labs  Lab 06/23/20 1715  CKTOTAL 104   BNP (last 3 results) No results for input(s): PROBNP in the last 8760 hours. HbA1C: No results for input(s): HGBA1C in the last 72 hours. CBG: No results for input(s): GLUCAP in the last 168 hours. Lipid Profile: No results for input(s): CHOL, HDL, LDLCALC, TRIG, CHOLHDL, LDLDIRECT in the last 72 hours. Thyroid Function Tests: No results for input(s): TSH, T4TOTAL, FREET4, T3FREE, THYROIDAB in the last 72 hours. Anemia Panel: No results for input(s): VITAMINB12, FOLATE, FERRITIN, TIBC, IRON, RETICCTPCT in the last 72 hours. Sepsis Labs: No results for input(s): PROCALCITON, LATICACIDVEN in the last 168 hours.  Recent Results (from the  past 240 hour(s))  Respiratory Panel by RT PCR (Flu A&B, Covid) - Nasopharyngeal Swab     Status: None   Collection Time: 06/23/20  8:09 PM   Specimen: Nasopharyngeal Swab; Nasopharyngeal(NP) swabs in vial transport medium  Result Value Ref Range Status   SARS Coronavirus 2 by RT PCR NEGATIVE NEGATIVE Final    Comment: (NOTE) SARS-CoV-2 target nucleic acids are NOT DETECTED.  The SARS-CoV-2 RNA is generally detectable in upper respiratoy specimens during the acute phase of infection. The lowest concentration of SARS-CoV-2 viral copies this assay can detect is 131 copies/mL. A negative result does not preclude SARS-Cov-2 infection and should not be used as the sole basis for treatment or other patient management decisions. A negative result may occur with  improper specimen collection/handling, submission of specimen other than nasopharyngeal swab, presence of viral mutation(s) within the areas targeted by this assay, and inadequate number of viral copies (<131  copies/mL). A negative result must be combined with clinical observations, patient history, and epidemiological information. The expected result is Negative.  Fact Sheet for Patients:  PinkCheek.be  Fact Sheet for Healthcare Providers:  GravelBags.it  This test is no t yet approved or cleared by the [place] FDA and  has been authorized for detection and/or diagnosis of SARS-CoV-2 by FDA under an Emergency Use Authorization (EUA). This EUA will remain  in effect (meaning this test can be used) for the duration of the COVID-19 declaration under Section 564(b)(1) of the Act, 21 U.S.C. section 360bbb-3(b)(1), unless the authorization is terminated or revoked sooner.     Influenza A by PCR NEGATIVE NEGATIVE Final   Influenza B by PCR NEGATIVE NEGATIVE Final    Comment: (NOTE) The Xpert Xpress SARS-CoV-2/FLU/RSV assay is intended as an aid in  the diagnosis of influenza from Nasopharyngeal swab specimens and  should not be used as a sole basis for treatment. Nasal washings and  aspirates are unacceptable for Xpert Xpress SARS-CoV-2/FLU/RSV  testing.  Fact Sheet for Patients: PinkCheek.be  Fact Sheet for Healthcare Providers: GravelBags.it  This test is not yet approved or cleared by the [place] FDA and  has been authorized for detection and/or diagnosis of SARS-CoV-2 by  FDA under an Emergency Use Authorization (EUA). This EUA will remain  in effect (meaning this test can be used) for the duration of the  Covid-19 declaration under Section 564(b)(1) of the Act, 21  U.S.C. section 360bbb-3(b)(1), unless the authorization is  terminated or revoked. Performed at [facility], [place] [address], [place], [place] [address]       Radiology Studies: DG CHEST PORT 1 VIEW  Result Date: 06/27/2020 CLINICAL DATA:  Pneumonia EXAM: PORTABLE CHEST 1 VIEW  COMPARISON:  06/23/2020 FINDINGS: Stable positioning of left-sided implanted cardiac device. The heart size and mediastinal contours are within normal limits. Atherosclerotic calcification of the aortic knob. Probable skin fold projecting over the inferior aspect of the lower right hemithorax which appears to have lung markings extending beyond the fold. No new focal airspace consolidation. No pleural effusion or pneumothorax. Stable calcified granulomas. IMPRESSION: 1. Probable skin fold projecting over the inferior aspect of the lower right hemithorax. Repeat radiograph after patient repositioning could be obtained to confirm. 2. Otherwise, no acute cardiopulmonary findings. Electronically Signed   By: Davina Poke D.O.   On: 06/27/2020 16:42    Scheduled Meds: . acetaminophen  1,000 mg Oral QID  . docusate sodium  100 mg Oral Daily  . enoxaparin (LOVENOX) injection  40  mg Subcutaneous Q24H  . famotidine  20 mg Oral BID  . feeding supplement  237 mL Oral TID BM  . [START ON 06/29/2020] influenza vaccine adjuvanted  0.5 mL Intramuscular Tomorrow-1000  . methimazole  10 mg Oral Daily  . metoCLOPramide (REGLAN) injection  5 mg Intravenous Q6H  . ondansetron (ZOFRAN) IV  4 mg Intravenous QID  . pantoprazole  40 mg Oral QAC supper  . polyethylene glycol  17 g Oral Daily  . sodium chloride flush  3 mL Intravenous Q12H   Continuous Infusions: . sodium chloride 75 mL/hr at 06/28/20 0604     LOS: 4 days   Time spent: 40 minutes.   Mckinley Jewel, MD [facility]  If 7PM-7AM, please contact night-coverage [email or web address] 06/28/2020, 2:16 PM

## 2020-06-28 NOTE — Plan of Care (Signed)
No acute changes since the previous night that I took care of her. Will continue to monitor and continue current POC. 

## 2020-06-29 ENCOUNTER — Inpatient Hospital Stay (HOSPITAL_COMMUNITY): Payer: Medicare Other

## 2020-06-29 DIAGNOSIS — S32591A Other specified fracture of right pubis, initial encounter for closed fracture: Secondary | ICD-10-CM | POA: Diagnosis not present

## 2020-06-29 LAB — BASIC METABOLIC PANEL
Anion gap: 10 (ref 5–15)
BUN: 13 mg/dL (ref 8–23)
CO2: 24 mmol/L (ref 22–32)
Calcium: 8.9 mg/dL (ref 8.9–10.3)
Chloride: 103 mmol/L (ref 98–111)
Creatinine, Ser: 0.72 mg/dL (ref 0.44–1.00)
GFR, Estimated: >60 mL/min (ref >60)
Glucose, Bld: 95 mg/dL (ref 70–99)
Potassium: 4.6 mmol/L (ref 3.5–5.1)
Sodium: 137 mmol/L (ref 135–145)

## 2020-06-29 LAB — CBC
HCT: 31.2 % — ABNORMAL LOW (ref 36.0–46.0)
Hemoglobin: 9.8 g/dL — ABNORMAL LOW (ref 12.0–15.0)
MCH: 29.9 pg (ref 26.0–34.0)
MCHC: 31.4 g/dL (ref 30.0–36.0)
MCV: 95.1 fL (ref 80.0–100.0)
Platelets: 315 10*3/uL (ref 150–400)
RBC: 3.28 MIL/uL — ABNORMAL LOW (ref 3.87–5.11)
RDW: 14.4 % (ref 11.5–15.5)
WBC: 7 10*3/uL (ref 4.0–10.5)
nRBC: 0 % (ref 0.0–0.2)

## 2020-06-29 LAB — SARS CORONAVIRUS 2 BY RT PCR (HOSPITAL ORDER, PERFORMED IN ~~LOC~~ HOSPITAL LAB): SARS Coronavirus 2: NEGATIVE

## 2020-06-29 MED ORDER — HYDRALAZINE HCL 20 MG/ML IJ SOLN: 10.0000 mg | Freq: Four times a day (QID) | INTRAMUSCULAR | Status: DC | PRN

## 2020-06-29 NOTE — Progress Notes (Signed)
PROGRESS NOTE    LAVEDA DEMEDEIROS  PPJ:[id number] DOB: 14-Feb-1931 DOA: 06/23/2020 PCP: Lajean Manes, MD   Brief Narrative:  BRANDYN THIEN is a 86 y.o.female lives with her son with medical history significant ofintermittent complete heart block status post pacemaker, low back pain, hypertension, dementia, systolic heart failure who presents following a fall at home. Due to patient's dementia history obtained with the assistance of the patient's son at bedside and chart review. Patient slipped and fell in her kitchen yesterday and landed on her right buttock and lower back area. Per her son, she was not able to walk since the fall and he has had to put her in a wheelchair to get around. She has significant pain if trying to move with pain located in her right hip and back. She did not lose consciousness nor hit her head when she fell.   In ED: Hip and pelvis x-rays> Possible nondisplaced superior right and inferior right pubic rami fractures lumbar spine x-ray: 1. Probable moderate acute superior endplate fracture at P80. Possible mild superior endplate deformity at L3 Orthopedic surgery and neurosurgery consulted in the ED.  Assessment & Plan:  Possible multiple pelvic fractures on the right: -Orthopedic recommended nonoperative management. -Pain control. -PT/OT-recommended SNF -Tylenol as needed for pain control.  Norco was discontinued due to persistent nausea  Possible lumbar and thoracic fractures: -Neurosurgery was consulted in the ED. -Further imaging with CT of lumbar and thoracic spine revealed that there is no acute lumbar fracture and the T12 fracture is chronic. -Continue with pain medication.  Tylenol as needed.  Continue PT/OT recommended SNF.  Persistent nausea: -Zofran 4 times daily is not helpful -Continue PPI. Cont. Phenergan as needed and Reglan 5 mg every 6 hours. -Norco discontinued due to persistent nausea. -Barrium swallow-shows esophageal  dismotility no obstruction. -Discussed with patient's son [name] & niece Dabbie-they seems very concerned about patient's persistent nausea, decrease appetite & weight loss (~10 lb) -Will consult GI for further evaluation.  Dehydration: -In the setting of decrease PO intake. Cont. IV fluid   Chronic systolic CHF: Ejection fraction of 45 to 50% from last echo in 2016.  Patient appears euvolemic.  Not on diuretics.  Strict INO's and daily weight.  Monitor signs for fluid overload.  Lung nodules with possibility of granulomatous disease: -Noted on CT of thoracic spine. -Noncontrast CT recommended in 6 to 12 months outpatient  Dementia: At baseline  Hyperthyroidism: Continue methimazole  Normocytic anemia: At baseline.  Continue to monitor  History of complete heart block status post pacemaker placement: Continue to monitor on telemetry.  DVT prophylaxis: Lovenox Code Status: Full code Family Communication:  None present at bedside.  Plan of care discussed with patient in length and she verbalized understanding and agreed with it.  Discussed plan of care with patient's son [name] & niece Geraldine Contras over the phone & they both verbalized understanding.   Dabbie's cell phone number-[phone]  Disposition Plan: SNF  Consultants:   Orthopedic surgery  Neurosurgery  Procedures:   CT thoracic and lumbar spine  Antimicrobials:   None  Status is: Inpatient  Dispo: The patient is from: Home              Anticipated d/c is to: SNF              Anticipated d/c date is: 06/29/2020              Patient currently is not medically stable to d/c.   Subjective: Patient  seen and examined.  Sitting comfortably on the bed.  Tells me that her nausea is better.  Denies abdominal pain, vomiting, epigastric burning.  Does not want to go to nursing home  Please note: Patient tells me that her symptoms are improving however she keep telling her son that she continues to have persistent nausea and  has no appetite.  Objective: Vitals:   06/29/20 0342 06/29/20 0500 06/29/20 0740 06/29/20 1433  BP: (!) 146/75  (!) 165/70 (!) 172/76  Pulse: 94  98 (!) 104  Resp: 15  17 17   Temp: 98.6 F (37 C)  98.8 F (37.1 C) 98.7 F (37.1 C)  TempSrc: Oral  Oral Oral  SpO2: 99%  98% 98%  Weight:  48.5 kg    Height:        Intake/Output Summary (Last 24 hours) at 06/29/2020 1516 Last data filed at 06/29/2020 1100 Gross per 24 hour  Intake 1269.09 ml  Output 1200 ml  Net 69.09 ml   Filed Weights   06/27/20 0500 06/28/20 0500 06/29/20 0500  Weight: 46.4 kg 48.6 kg 48.5 kg    Examination: General exam: Appears calm and comfortable, elderly, on room air, communicating well Respiratory system: Clear to auscultation. Respiratory effort normal. Cardiovascular system: S1 & S2 heard, RRR. No JVD, murmurs, rubs, gallops or clicks. No pedal edema. Gastrointestinal system: Abdomen is nondistended, soft and nontender. No organomegaly or masses felt. Normal bowel sounds heard. Central nervous system: Alert and oriented. No focal neurological deficits. Extremities: Symmetric 5 x 5 power. Skin: No rashes, lesions or ulcers.   Data Reviewed: I have personally reviewed following labs and imaging studies  CBC: Recent Labs  Lab 06/23/20 1715 06/23/20 1715 06/24/20 0628 06/25/20 0850 06/26/20 0804 06/28/20 0055 06/29/20 0304  WBC 9.7   < > 7.5 9.1 8.3 6.8 7.0  NEUTROABS 7.4  --   --   --   --   --   --   HGB 12.4   < > 10.9* 10.3* 10.1* 9.2* 9.8*  HCT 38.8   < > 33.9* 31.9* 31.3* 28.9* 31.2*  MCV 95.1   < > 93.6 95.5 93.2 95.7 95.1  PLT 237   < > 245 239 255 297 315   < > = values in this interval not displayed.   Basic Metabolic Panel: Recent Labs  Lab 06/24/20 0628 06/25/20 0850 06/26/20 0804 06/27/20 0248 06/29/20 0304  NA 136 137 135 136 137  K 4.4 5.1 3.9 4.1 4.6  CL 102 102 101 103 103  CO2 25 28 27 25 24   GLUCOSE 109* 101* 114* 128* 95  BUN 15 20 20 17 13   CREATININE  0.94 0.96 0.85 0.81 0.72  CALCIUM 9.0 9.2 8.7* 8.6* 8.9   GFR: Estimated Creatinine Clearance: 37.9 mL/min (by C-G formula based on SCr of 0.72 mg/dL). Liver Function Tests: Recent Labs  Lab 06/23/20 1715 06/24/20 0628  AST 28 19  ALT 21 16  ALKPHOS 85 69  BILITOT 1.1 1.0  PROT 7.3 6.4*  ALBUMIN 4.0 3.4*   No results for input(s): LIPASE, AMYLASE in the last 168 hours. No results for input(s): AMMONIA in the last 168 hours. Coagulation Profile: No results for input(s): INR, PROTIME in the last 168 hours. Cardiac Enzymes: Recent Labs  Lab 06/23/20 1715  CKTOTAL 104   BNP (last 3 results) No results for input(s): PROBNP in the last 8760 hours. HbA1C: No results for input(s): HGBA1C in the last 72 hours. CBG: No results  for input(s): GLUCAP in the last 168 hours. Lipid Profile: No results for input(s): CHOL, HDL, LDLCALC, TRIG, CHOLHDL, LDLDIRECT in the last 72 hours. Thyroid Function Tests: No results for input(s): TSH, T4TOTAL, FREET4, T3FREE, THYROIDAB in the last 72 hours. Anemia Panel: No results for input(s): VITAMINB12, FOLATE, FERRITIN, TIBC, IRON, RETICCTPCT in the last 72 hours. Sepsis Labs: No results for input(s): PROCALCITON, LATICACIDVEN in the last 168 hours.  Recent Results (from the past 240 hour(s))  Respiratory Panel by RT PCR (Flu A&B, Covid) - Nasopharyngeal Swab     Status: None   Collection Time: 06/23/20  8:09 PM   Specimen: Nasopharyngeal Swab; Nasopharyngeal(NP) swabs in vial transport medium  Result Value Ref Range Status   SARS Coronavirus 2 by RT PCR NEGATIVE NEGATIVE Final    Comment: (NOTE) SARS-CoV-2 target nucleic acids are NOT DETECTED.  The SARS-CoV-2 RNA is generally detectable in upper respiratoy specimens during the acute phase of infection. The lowest concentration of SARS-CoV-2 viral copies this assay can detect is 131 copies/mL. A negative result does not preclude SARS-Cov-2 infection and should not be used as the sole basis  for treatment or other patient management decisions. A negative result may occur with  improper specimen collection/handling, submission of specimen other than nasopharyngeal swab, presence of viral mutation(s) within the areas targeted by this assay, and inadequate number of viral copies (<131 copies/mL). A negative result must be combined with clinical observations, patient history, and epidemiological information. The expected result is Negative.  Fact Sheet for Patients:  PinkCheek.be  Fact Sheet for Healthcare Providers:  GravelBags.it  This test is no t yet approved or cleared by the [place] FDA and  has been authorized for detection and/or diagnosis of SARS-CoV-2 by FDA under an Emergency Use Authorization (EUA). This EUA will remain  in effect (meaning this test can be used) for the duration of the COVID-19 declaration under Section 564(b)(1) of the Act, 21 U.S.C. section 360bbb-3(b)(1), unless the authorization is terminated or revoked sooner.     Influenza A by PCR NEGATIVE NEGATIVE Final   Influenza B by PCR NEGATIVE NEGATIVE Final    Comment: (NOTE) The Xpert Xpress SARS-CoV-2/FLU/RSV assay is intended as an aid in  the diagnosis of influenza from Nasopharyngeal swab specimens and  should not be used as a sole basis for treatment. Nasal washings and  aspirates are unacceptable for Xpert Xpress SARS-CoV-2/FLU/RSV  testing.  Fact Sheet for Patients: PinkCheek.be  Fact Sheet for Healthcare Providers: GravelBags.it  This test is not yet approved or cleared by the [place] FDA and  has been authorized for detection and/or diagnosis of SARS-CoV-2 by  FDA under an Emergency Use Authorization (EUA). This EUA will remain  in effect (meaning this test can be used) for the duration of the  Covid-19 declaration under Section 564(b)(1) of the Act, 21    U.S.C. section 360bbb-3(b)(1), unless the authorization is  terminated or revoked. Performed at [facility], [place] [address] [place],  [address]   SARS Coronavirus 2 by RT PCR (hospital order, performed in [facility] hospital lab) Nasopharyngeal Nasopharyngeal Swab     Status: None   Collection Time: 06/29/20  6:33 AM   Specimen: Nasopharyngeal Swab  Result Value Ref Range Status   SARS Coronavirus 2 NEGATIVE NEGATIVE Final    Comment: (NOTE) SARS-CoV-2 target nucleic acids are NOT DETECTED.  The SARS-CoV-2 RNA is generally detectable in upper and lower respiratory specimens during the acute phase of infection. The  lowest concentration of SARS-CoV-2 viral copies this assay can detect is 250 copies / mL. A negative result does not preclude SARS-CoV-2 infection and should not be used as the sole basis for treatment or other patient management decisions.  A negative result may occur with improper specimen collection / handling, submission of specimen other than nasopharyngeal swab, presence of viral mutation(s) within the areas targeted by this assay, and inadequate number of viral copies (<250 copies / mL). A negative result must be combined with clinical observations, patient history, and epidemiological information.  Fact Sheet for Patients:   StrictlyIdeas.no  Fact Sheet for Healthcare Providers: BankingDealers.co.za  This test is not yet approved or  cleared by the [place] FDA and has been authorized for detection and/or diagnosis of SARS-CoV-2 by FDA under an Emergency Use Authorization (EUA).  This EUA will remain in effect (meaning this test can be used) for the duration of the COVID-19 declaration under Section 564(b)(1) of the Act, 21 U.S.C. section 360bbb-3(b)(1), unless the authorization is terminated or revoked sooner.  Performed at [facility], [place] [address] [place], [place] [address]        Radiology Studies: DG CHEST PORT 1 VIEW  Result Date: 06/27/2020 CLINICAL DATA:  Pneumonia EXAM: PORTABLE CHEST 1 VIEW COMPARISON:  06/23/2020 FINDINGS: Stable positioning of left-sided implanted cardiac device. The heart size and mediastinal contours are within normal limits. Atherosclerotic calcification of the aortic knob. Probable skin fold projecting over the inferior aspect of the lower right hemithorax which appears to have lung markings extending beyond the fold. No new focal airspace consolidation. No pleural effusion or pneumothorax. Stable calcified granulomas. IMPRESSION: 1. Probable skin fold projecting over the inferior aspect of the lower right hemithorax. Repeat radiograph after patient repositioning could be obtained to confirm. 2. Otherwise, no acute cardiopulmonary findings. Electronically Signed   By: Davina Poke D.O.   On: 06/27/2020 16:42   DG ESOPHAGUS W SINGLE CM (SOL OR THIN BA)  Result Date: 06/29/2020 CLINICAL DATA:  Loss of weight.  Difficulty swallowing. EXAM: ESOPHOGRAM/BARIUM SWALLOW TECHNIQUE: Single contrast examination was performed using  thin barium. FLUOROSCOPY TIME:  Fluoroscopy Time:  42 seconds Radiation Exposure Index (if provided by the fluoroscopic device): 2.1 mGy Number of Acquired Spot Images: 1 COMPARISON:  None. FINDINGS: A limited examination was performed using thin barium. The patient was in the semi upright LPO orientation. The patient took several swallows of the enteric contrast material which passes through the esophagus and into the stomach. No signs of high-grade stricture or obstructing mass. Signs of esophageal dysmotility noted with multiple non propulsive tertiary waves with to and fro stasis enteric contrast material noted in the mid and distal esophagus IMPRESSION: 1. No evidence for esophageal obstruction. 2. Moderate esophageal dysmotility. Electronically Signed   By: Kerby Moors M.D.   On: 06/29/2020 13:32    Scheduled  Meds: . acetaminophen  1,000 mg Oral QID  . docusate sodium  100 mg Oral Daily  . enoxaparin (LOVENOX) injection  40 mg Subcutaneous Q24H  . famotidine  20 mg Oral BID  . feeding supplement  237 mL Oral TID BM  . influenza vaccine adjuvanted  0.5 mL Intramuscular Tomorrow-1000  . methimazole  10 mg Oral Daily  . metoCLOPramide (REGLAN) injection  5 mg Intravenous Q6H  . ondansetron (ZOFRAN) IV  4 mg Intravenous QID  . pantoprazole  40 mg Oral QAC supper  . polyethylene glycol  17 g Oral Daily  . sodium chloride flush  3 mL Intravenous Q12H   Continuous Infusions: . sodium chloride Stopped (06/28/20 1624)     LOS: 5 days   Time spent: 40 minutes.   Mckinley Jewel, MD [facility]  If 7PM-7AM, please contact night-coverage [email or web address] 06/29/2020, 3:16 PM

## 2020-06-30 DIAGNOSIS — M5136 Other intervertebral disc degeneration, lumbar region: Secondary | ICD-10-CM | POA: Diagnosis not present

## 2020-06-30 DIAGNOSIS — M2550 Pain in unspecified joint: Secondary | ICD-10-CM | POA: Diagnosis not present

## 2020-06-30 DIAGNOSIS — I5022 Chronic systolic (congestive) heart failure: Secondary | ICD-10-CM | POA: Diagnosis not present

## 2020-06-30 DIAGNOSIS — H2589 Other age-related cataract: Secondary | ICD-10-CM | POA: Diagnosis not present

## 2020-06-30 DIAGNOSIS — R110 Nausea: Secondary | ICD-10-CM | POA: Diagnosis not present

## 2020-06-30 DIAGNOSIS — F0390 Unspecified dementia without behavioral disturbance: Secondary | ICD-10-CM | POA: Diagnosis not present

## 2020-06-30 DIAGNOSIS — R5381 Other malaise: Secondary | ICD-10-CM | POA: Diagnosis not present

## 2020-06-30 DIAGNOSIS — S32000D Wedge compression fracture of unspecified lumbar vertebra, subsequent encounter for fracture with routine healing: Secondary | ICD-10-CM | POA: Diagnosis not present

## 2020-06-30 DIAGNOSIS — R296 Repeated falls: Secondary | ICD-10-CM | POA: Diagnosis not present

## 2020-06-30 DIAGNOSIS — R1310 Dysphagia, unspecified: Secondary | ICD-10-CM | POA: Diagnosis not present

## 2020-06-30 DIAGNOSIS — F419 Anxiety disorder, unspecified: Secondary | ICD-10-CM | POA: Diagnosis not present

## 2020-06-30 DIAGNOSIS — E0590 Thyrotoxicosis, unspecified without thyrotoxic crisis or storm: Secondary | ICD-10-CM | POA: Diagnosis not present

## 2020-06-30 DIAGNOSIS — E871 Hypo-osmolality and hyponatremia: Secondary | ICD-10-CM | POA: Diagnosis not present

## 2020-06-30 DIAGNOSIS — D649 Anemia, unspecified: Secondary | ICD-10-CM | POA: Diagnosis not present

## 2020-06-30 DIAGNOSIS — M545 Low back pain, unspecified: Secondary | ICD-10-CM | POA: Diagnosis not present

## 2020-06-30 DIAGNOSIS — F32A Depression, unspecified: Secondary | ICD-10-CM | POA: Diagnosis not present

## 2020-06-30 DIAGNOSIS — K567 Ileus, unspecified: Secondary | ICD-10-CM | POA: Diagnosis not present

## 2020-06-30 DIAGNOSIS — Z7401 Bed confinement status: Secondary | ICD-10-CM | POA: Diagnosis not present

## 2020-06-30 DIAGNOSIS — S32591D Other specified fracture of right pubis, subsequent encounter for fracture with routine healing: Secondary | ICD-10-CM | POA: Diagnosis not present

## 2020-06-30 DIAGNOSIS — M6281 Muscle weakness (generalized): Secondary | ICD-10-CM | POA: Diagnosis not present

## 2020-06-30 DIAGNOSIS — K219 Gastro-esophageal reflux disease without esophagitis: Secondary | ICD-10-CM | POA: Diagnosis not present

## 2020-06-30 DIAGNOSIS — R41841 Cognitive communication deficit: Secondary | ICD-10-CM | POA: Diagnosis not present

## 2020-06-30 DIAGNOSIS — I442 Atrioventricular block, complete: Secondary | ICD-10-CM | POA: Diagnosis not present

## 2020-06-30 DIAGNOSIS — R109 Unspecified abdominal pain: Secondary | ICD-10-CM | POA: Diagnosis not present

## 2020-06-30 DIAGNOSIS — Z9181 History of falling: Secondary | ICD-10-CM | POA: Diagnosis not present

## 2020-06-30 DIAGNOSIS — K5900 Constipation, unspecified: Secondary | ICD-10-CM | POA: Diagnosis not present

## 2020-06-30 DIAGNOSIS — R911 Solitary pulmonary nodule: Secondary | ICD-10-CM | POA: Diagnosis not present

## 2020-06-30 DIAGNOSIS — S32591A Other specified fracture of right pubis, initial encounter for closed fracture: Secondary | ICD-10-CM | POA: Diagnosis not present

## 2020-06-30 DIAGNOSIS — E860 Dehydration: Secondary | ICD-10-CM | POA: Diagnosis not present

## 2020-06-30 DIAGNOSIS — R627 Adult failure to thrive: Secondary | ICD-10-CM | POA: Diagnosis not present

## 2020-06-30 DIAGNOSIS — R918 Other nonspecific abnormal finding of lung field: Secondary | ICD-10-CM | POA: Diagnosis not present

## 2020-06-30 DIAGNOSIS — H40019 Open angle with borderline findings, low risk, unspecified eye: Secondary | ICD-10-CM | POA: Diagnosis not present

## 2020-06-30 DIAGNOSIS — M8580 Other specified disorders of bone density and structure, unspecified site: Secondary | ICD-10-CM | POA: Diagnosis not present

## 2020-06-30 DIAGNOSIS — I110 Hypertensive heart disease with heart failure: Secondary | ICD-10-CM | POA: Diagnosis not present

## 2020-06-30 DIAGNOSIS — R630 Anorexia: Secondary | ICD-10-CM | POA: Diagnosis not present

## 2020-06-30 DIAGNOSIS — N819 Female genital prolapse, unspecified: Secondary | ICD-10-CM | POA: Diagnosis not present

## 2020-06-30 DIAGNOSIS — E785 Hyperlipidemia, unspecified: Secondary | ICD-10-CM | POA: Diagnosis not present

## 2020-06-30 DIAGNOSIS — Z950 Presence of cardiac pacemaker: Secondary | ICD-10-CM | POA: Diagnosis not present

## 2020-06-30 LAB — CREATININE, SERUM
Creatinine, Ser: 0.72 mg/dL (ref 0.44–1.00)
GFR, Estimated: >60 mL/min (ref >60)

## 2020-06-30 MED ORDER — ACETAMINOPHEN 500 MG PO TABS
1000.0000 mg | ORAL_TABLET | Freq: Four times a day (QID) | ORAL | 0 refills | Status: DC
Start: 2020-06-30 — End: 2022-09-15

## 2020-06-30 MED ORDER — PROMETHAZINE HCL 12.5 MG PO TABS: 12.5000 mg | ORAL_TABLET | Freq: Three times a day (TID) | ORAL | 0 refills | Status: DC

## 2020-06-30 MED ORDER — METOCLOPRAMIDE HCL 5 MG PO TABS: 5.0000 mg | ORAL_TABLET | Freq: Three times a day (TID) | ORAL | 1 refills | Status: DC

## 2020-06-30 MED ORDER — PANTOPRAZOLE SODIUM 40 MG PO TBEC: 40.0000 mg | DELAYED_RELEASE_TABLET | Freq: Every day | ORAL | 0 refills | Status: AC

## 2020-06-30 MED ORDER — ONDANSETRON HCL 4 MG PO TABS: 4.0000 mg | ORAL_TABLET | Freq: Three times a day (TID) | ORAL | 1 refills | Status: DC

## 2020-06-30 MED ORDER — ENSURE ENLIVE PO LIQD
237.0000 mL | Freq: Three times a day (TID) | ORAL | 12 refills | Status: DC
Start: 2020-06-30 — End: 2022-02-25

## 2020-06-30 MED ORDER — DOCUSATE SODIUM 100 MG PO CAPS
100.0000 mg | ORAL_CAPSULE | Freq: Every day | ORAL | 0 refills | Status: DC
Start: 2020-07-01 — End: 2020-11-20

## 2020-06-30 MED ORDER — POLYETHYLENE GLYCOL 3350 17 G PO PACK
17.0000 g | PACK | Freq: Every day | ORAL | 0 refills | Status: DC
Start: 2020-07-01 — End: 2021-01-09

## 2020-06-30 NOTE — TOC Transition Note (Signed)
Transition of Care [facility]) - CM/SW Discharge Note   Patient Details  Name: Gabrielle Hoffman MRN: [id number] Date of Birth: 21-Apr-1931  [facility]) CM/SW Contact:  Bartholomew Crews, RN Phone Number: [phone] 06/30/2020, 3:56 PM   Clinical Narrative:     Notified that patient could transition to [facility] today. DC summary completed and faxed to [facility]. Nursing to call report to [facility] at [phone]. PTAR arranged. Son, [name], notified. Medical transport form completed. No further TOC needs identified.   Final next level of care: [place] Barriers to Discharge: No Barriers Identified   Patient Goals and CMS Choice Patient states their goals for this hospitalization and ongoing recovery are:: rehab at [email or web address] Compare Post Acute Care list provided to:: Patient Represenative (must comment) (patient's son) Choice offered to / list presented to : Adult Children  Discharge Placement              Patient chooses bed at: [facility] Patient to be transferred to facility by: [place] Name of family member notified: [name], son Patient and family notified of of transfer: 06/30/20  Discharge Plan and Services   Discharge Planning Services: CM Consult Post Acute Care Choice: [place]                               Social Determinants of Health (SDOH) Interventions     Readmission Risk Interventions Readmission Risk Prevention Plan 06/25/2020  Post Dischage Appt Complete  Medication Screening Complete  Transportation Screening Complete  Some recent data might be hidden

## 2020-06-30 NOTE — Progress Notes (Signed)
Adali Pennings called and was checking on the status of discharge.  He requested that the patient be given prune juice.  It was provided but she would only drink a small amount.  The son's name and number was given to the Social Worker/Case management to contact him so that he could ask questions

## 2020-06-30 NOTE — Progress Notes (Signed)
Phyllis (nurse) at [facility] was called and given report on the patient.  A discharge packet has been printed and will be sent via PTAR with the patient.

## 2020-06-30 NOTE — Plan of Care (Signed)
No acute changes since the previous night that I took care of her. Patient's son is concerned about her persistent nausea - the patient is telling the doctor that she is fine and her nausea is getting better but is telling her son the opposite. Discharge plan is pending because patient does not want to to go SNF. No needs voiced or apparent distress at this time. Will continue to monitor and continue current POC.

## 2020-06-30 NOTE — Plan of Care (Signed)

## 2020-06-30 NOTE — Discharge Summary (Addendum)
Physician Discharge Summary  Gabrielle Hoffman VEH:[id number] DOB: May 21, 1931 DOA: 06/23/2020  PCP: Lajean Manes, MD  Admit date: 06/23/2020 Discharge date: 06/30/2020  Admitted From: Home Disposition:  SNF  Recommendations for Outpatient Follow-up:  1. Follow up with PCP 2. Increase PO intake 3. Miralax & Colace for constipation  4. Avoid norcotics due to persistence nausea 5. Cont. Zofran & Reglan  Home Health:None  Equipment/Devices:None  Discharge Condition:Stable  CODE STATUS:Full code Diet recommendation: soft diet  Brief/Interim Summary:  Gabrielle Skalicky Phillipsis a 86 y.o.femalelives with her sonwith medical history significant ofintermittent complete heart block status post pacemaker, low back pain, hypertension, dementia, systolic heart failure who presents following a fall at home. Due to patient's dementia history obtained with the assistance of the patient's Hoffman at bedside and chart review. Patient slipped and fell in her kitchen yesterday and landed on her right buttock and lower back area. Per her Hoffman, she was not able to walk since the fall and he has had to put her in a wheelchair to get around. She has significant pain if trying to move with pain located in her right hip and back. She did not lose consciousness nor hit her head when she fell.   In ED: Hip and pelvis x-rays> Possible nondisplaced superior right and inferior right pubic rami fractures lumbar spine x-ray: 1. Probable moderate acute superior endplate fracture at E36. Possible mild superior endplate deformity at L3 Orthopedic surgery and neurosurgery consulted in the ED.  Possible multiple pelvic fractures on the right: -Orthopedic recommended nonoperative management. -Cont'd with Pain control. -PT/OT-recommended SNF -Tylenol as needed for pain control.  Norco was discontinued due to persistent nausea  Possible lumbar and thoracic fractures: -Neurosurgery was consulted in the ED. -Further  imaging with CT of lumbar and thoracic spine revealed that there is no acute lumbar fracture and the T12 fracture is chronic. -Continued with pain medication.  Tylenol as needed.  PT/OT recommended SNF.  Persistent nausea: -Worsening recently as per Hoffman. -Pt was given PPI, Zofran, Reglan & Phenergan -Norco discontinued due to persistent nausea. -Evaluated by speech therapy as well and recommended regular solids, thin liquids. -Barium swallow shows No Obstruction, shows esophageal dismotility -GI consulted-Recommended to cont. Anti nausea medications. No further GI work up- NO EGD at this time-GI Signed off. -EKG showed QT prolongation-Pt will be discharged home on Phenergan TID scheduled & PPI.  Dehydration: Resolved -In the setting of decrease PO intake. Started on IV fluid. Symptoms improved & Vitals remained stable, Kidney function-WNL  Chronic systolic CHF: Ejection fraction of 45 to 50% from last echo in 2016.  Patient appears euvolemic.  Not on diuretics.  Strict INO's and daily weight.  Monitored signs for fluid overload.  Lung nodules with possibility of granulomatous disease: -Noted on CT of thoracic spine. -Noncontrast CT recommended in 6 to 12 months outpatient  Dementia: At baseline  Hyperthyroidism: Continued methimazole  Normocytic anemia: At baseline.    History of complete heart block status post pacemaker placement: Continue to monitor on telemetry.  Disposition: Patient is stable at the time of discharge to SNF.  Plan of care discussed with patient Hoffman over the phone.  He is concerned about patient's persistent nausea.  I reassured him-that patient is seen by GI and they recommended to continue scheduled antinausea medication-no further GI work-up recommended.  Discussed about barium swallow result again.  He is concerned about chapped lips and dehydration.  I discussed with him in details that patient does not needs IV  fluids-her vitals and kidney function is  within normal limits.  She needs to continue to drink water.  Discharge Diagnoses:   Possible multiple pelvic fractures on the right. Possible lumbar and thoracic fractures Persistent nausea and Dehydration Chronic systolic CHF Lung nodules with possibility of granulomatous disease Dementia Hypothyroidism Normocytic anemia History of complete heart block status post pacemaker placement  Discharge Instructions  Discharge Instructions    Discharge instructions   Complete by: As directed    Follow up with PCP outpatient Cont. Zofran & Reglan for nausea Increase PO water intake Take Miralax & Colace for constipation Tylenol PRN for pain   Increase activity slowly   Complete by: As directed      Allergies as of 06/30/2020      Reactions   Sulfa Antibiotics Rash   Sulfamethoxazole Rash      Medication List    STOP taking these medications   ondansetron 4 MG disintegrating tablet Commonly known as: ZOFRAN-ODT     TAKE these medications   acetaminophen 500 MG tablet Commonly known as: TYLENOL Take 2 tablets (1,000 mg total) by mouth 4 (four) times daily.   docusate sodium 100 MG capsule Commonly known as: COLACE Take 1 capsule (100 mg total) by mouth daily. Start taking on: July 01, 2020   dronabinol 2.5 MG capsule Commonly known as: MARINOL Take 2.5 mg by mouth daily as needed (appetite).   feeding supplement Liqd Take 237 mLs by mouth 3 (three) times daily between meals.   ibuprofen 200 MG tablet Commonly known as: ADVIL Take 200 mg by mouth every 6 (six) hours as needed for moderate pain.   methimazole 10 MG tablet Commonly known as: TAPAZOLE Take 10 mg by mouth daily.   pantoprazole 40 MG tablet Commonly known as: PROTONIX Take 1 tablet (40 mg total) by mouth daily before supper.   polyethylene glycol 17 g packet Commonly known as: MIRALAX / GLYCOLAX Take 17 g by mouth daily. Start taking on: July 01, 2020   promethazine 12.5 MG  tablet Commonly known as: PHENERGAN Take 1 tablet (12.5 mg total) by mouth in the morning, at noon, and at bedtime.       Allergies  Allergen Reactions  . Sulfa Antibiotics Rash  . Sulfamethoxazole Rash    Consultations:  Ortho  Neurosurgery  GI   Procedures/Studies: DG Chest 1 View  Result Date: 06/23/2020 CLINICAL DATA:  Fall EXAM: CHEST  1 VIEW COMPARISON:  11/20/2015 FINDINGS: Left-sided pacing device as before. No focal opacity or pleural effusion. Probable calcified granuloma in the right infrahilar lung. Normal cardiomediastinal silhouette with aortic atherosclerosis. No pneumothorax. IMPRESSION: No active disease. Electronically Signed   By: Donavan Foil M.D.   On: 06/23/2020 18:21   DG Lumbar Spine Complete  Result Date: 06/23/2020 CLINICAL DATA:  Fall EXAM: LUMBAR SPINE - COMPLETE 4+ VIEW COMPARISON:  MRI 12/27/2011 FINDINGS: Dextroscoliosis of the lumbar spine. Probable moderate acute superior endplate fracture at K74. Possible mild endplate deformity at L3. Aortic atherosclerosis. Moderate degenerative changes at L3-L4 and L5-S1. IMPRESSION: 1. Probable moderate acute superior endplate fracture at Q59. Possible mild superior endplate deformity at L3. 2. Dextroscoliosis of the lumbar spine with degenerative changes. Electronically Signed   By: Donavan Foil M.D.   On: 06/23/2020 18:20   CT THORACIC SPINE WO CONTRAST  Result Date: 06/24/2020 CLINICAL DATA:  Initial evaluation for compression fracture. EXAM: CT THORACIC SPINE WITHOUT CONTRAST TECHNIQUE: Multidetector CT images of the thoracic were obtained using the standard protocol  without intravenous contrast. COMPARISON:  Prior radiograph from 06/23/2020. FINDINGS: Alignment: Same numbering system is employed as on previous exams. Examination is somewhat technically limited as the upper thoracic spine is incompletely visualized on this exam. T1 and T2 vertebral bodies are not completely visualized. Mild straightening  of the normal midthoracic kyphosis. No listhesis. Vertebrae: Previously identified compression deformity involving the superior endplate of F62 is chronic in appearance by CT. Associated height loss measures up to 30% with 4 mm bony retropulsion. Resultant mild spinal stenosis at this level. Otherwise, vertebral body height maintained with no other acute or chronic fracture identified. No discrete or worrisome osseous lesions. Visualized ribs intact. Paraspinal and other soft tissues: Paraspinous soft tissues demonstrate no acute abnormality. Aortic atherosclerosis. Cardiac pacemaker electrodes partially visualized. 7 mm nodule seen within the right middle lobe (series 4, image 98). Adjacent calcified granuloma. Additional scattered atelectatic changes noted dependently within the visualized lung bases. Irregular pleuroparenchymal thickening noted at the lung apices bilaterally. Partially visualized lungs are otherwise clear. Disc levels: T6-7: Mild disc bulge with reactive endplate change. No significant spinal stenosis. Foramina remain patent. T7-8: Central disc osteophyte complex indents the ventral thecal sac (series 4, image 62). No significant spinal stenosis. Foramina remain patent. 4 mm bony retropulsion at the level of T11-12 related to the chronic T12 compression fracture. Mild spinal stenosis. Foramina remain patent. No other significant disc pathology seen for patient age. Mild multilevel facet degeneration noted. No other significant spinal stenosis. Foramina remain patent. IMPRESSION: 1. Compression deformity involving the superior endplate of Z30 is chronic in nature. Associated 30% height loss with 4 mm bony retropulsion and mild spinal stenosis at this level. 2. No other acute fracture or other traumatic injury within the thoracic spine. 3. Few scattered calcified granulomas with the visualized right middle lobe, suggesting prior granulomatous infection. Additional 7 mm noncalcified right middle  lobe nodule is indeterminate, although also suspected to be related to prior atypical/granulomatous infection. Non-contrast chest CT at 6-12 months is recommended. If the nodule is stable at time of repeat CT, then future CT at 18-24 months (from today's scan) is considered optional for low-risk patients, but is recommended for high-risk patients. This recommendation follows the consensus statement: Guidelines for Management of Incidental Pulmonary Nodules Detected on CT Images: From the Fleischner Society 2017; Radiology 2017; 284:228-243. 4. Aortic Atherosclerosis (ICD10-I70.0). Electronically Signed   By: Jeannine Boga M.D.   On: 06/24/2020 03:17   CT LUMBAR SPINE WO CONTRAST  Result Date: 06/23/2020 CLINICAL DATA:  Low back pain after fall EXAM: CT LUMBAR SPINE WITHOUT CONTRAST TECHNIQUE: Multidetector CT imaging of the lumbar spine was performed without intravenous contrast administration. Multiplanar CT image reconstructions were also generated. COMPARISON:  None. FINDINGS: Segmentation: 5 lumbar type vertebrae. Alignment: Dextroscoliosis centered at L3. No significant anteroposterior listhesis. Vertebrae: No acute fracture of the lumbar spine. T12 compression fracture is present on scout but superior endplate is not included in the field of view of CT. Paraspinal and other soft tissues: Metallic density the left ventral upper abdomen. Aortic atherosclerosis. Stool throughout the visualized colon. Disc levels: Advanced multilevel degenerative changes with disc bulges, endplate osteophytes, and facet hypertrophy. IMPRESSION: Superior endplate of Q65 is not included in the field of view. There is no acute lumbar spine fracture. Electronically Signed   By: Macy Mis M.D.   On: 06/23/2020 21:25   DG CHEST PORT 1 VIEW  Result Date: 06/27/2020 CLINICAL DATA:  Pneumonia EXAM: PORTABLE CHEST 1 VIEW COMPARISON:  06/23/2020 FINDINGS:  Stable positioning of left-sided implanted cardiac device. The  heart size and mediastinal contours are within normal limits. Atherosclerotic calcification of the aortic knob. Probable skin fold projecting over the inferior aspect of the lower right hemithorax which appears to have lung markings extending beyond the fold. No new focal airspace consolidation. No pleural effusion or pneumothorax. Stable calcified granulomas. IMPRESSION: 1. Probable skin fold projecting over the inferior aspect of the lower right hemithorax. Repeat radiograph after patient repositioning could be obtained to confirm. 2. Otherwise, no acute cardiopulmonary findings. Electronically Signed   By: Davina Poke D.O.   On: 06/27/2020 16:42   DG Hip Unilat W or Wo Pelvis 2-3 Views Right  Result Date: 06/23/2020 CLINICAL DATA:  Fall and pain EXAM: DG HIP (WITH OR WITHOUT PELVIS) 2-3V RIGHT COMPARISON:  None. FINDINGS: There is question of cortical step-off seen at the superior right pubic rami and inferior pubic rami which could represent nondisplaced fractures. Moderate right hip osteoarthritis is seen with superior joint space loss and marginal osteophyte formation. No acute osseous abnormality. IMPRESSION: Possible nondisplaced superior right and inferior right pubic rami fractures. If further evaluation is required would recommend cross-sectional imaging. Electronically Signed   By: Prudencio Pair M.D.   On: 06/23/2020 18:23   DG ESOPHAGUS W SINGLE CM (SOL OR THIN BA)  Result Date: 06/29/2020 CLINICAL DATA:  Loss of weight.  Difficulty swallowing. EXAM: ESOPHOGRAM/BARIUM SWALLOW TECHNIQUE: Single contrast examination was performed using  thin barium. FLUOROSCOPY TIME:  Fluoroscopy Time:  42 seconds Radiation Exposure Index (if provided by the fluoroscopic device): 2.1 mGy Number of Acquired Spot Images: 1 COMPARISON:  None. FINDINGS: A limited examination was performed using thin barium. The patient was in the semi upright LPO orientation. The patient took several swallows of the enteric  contrast material which passes through the esophagus and into the stomach. No signs of high-grade stricture or obstructing mass. Signs of esophageal dysmotility noted with multiple non propulsive tertiary waves with to and fro stasis enteric contrast material noted in the mid and distal esophagus IMPRESSION: 1. No evidence for esophageal obstruction. 2. Moderate esophageal dysmotility. Electronically Signed   By: Kerby Moors M.D.   On: 06/29/2020 13:32      Subjective:  Patient seen and examined.  She is sitting comfortably on the bed.  Tells me that her nausea is better.  She has low appetite but she is working on it.  She denies abdominal pain, headache, chest pain, shortness of breath, leg swelling.  Discharge Exam: Vitals:   06/30/20 0836 06/30/20 1418  BP: (!) 154/55 (!) 147/72  Pulse: 94 92  Resp: 17 17  Temp: 98.4 F (36.9 C) 98.3 F (36.8 C)  SpO2: 98% 99%   Vitals:   06/30/20 0500 06/30/20 0815 06/30/20 0836 06/30/20 1418  BP:  (!) 154/55 (!) 154/55 (!) 147/72  Pulse:  94 94 92  Resp:  17 17 17   Temp:  98.4 F (36.9 C) 98.4 F (36.9 C) 98.3 F (36.8 C)  TempSrc:  Oral Oral Oral  SpO2:  98% 98% 99%  Weight: 48.8 kg     Height:        General: Pt is alert, awake, not in acute distress Cardiovascular: RRR, S1/S2 +, no rubs, no gallops Respiratory: CTA bilaterally, no wheezing, no rhonchi Abdominal: Soft, NT, ND, bowel sounds + Extremities: no edema, no cyanosis    The results of significant diagnostics from this hospitalization (including imaging, microbiology, ancillary and laboratory) are listed below for  reference.     Microbiology: Recent Results (from the past 240 hour(s))  Respiratory Panel by RT PCR (Flu A&B, Covid) - Nasopharyngeal Swab     Status: None   Collection Time: 06/23/20  8:09 PM   Specimen: Nasopharyngeal Swab; Nasopharyngeal(NP) swabs in vial transport medium  Result Value Ref Range Status   SARS Coronavirus 2 by RT PCR NEGATIVE  NEGATIVE Final    Comment: (NOTE) SARS-CoV-2 target nucleic acids are NOT DETECTED.  The SARS-CoV-2 RNA is generally detectable in upper respiratoy specimens during the acute phase of infection. The lowest concentration of SARS-CoV-2 viral copies this assay can detect is 131 copies/mL. A negative result does not preclude SARS-Cov-2 infection and should not be used as the sole basis for treatment or other patient management decisions. A negative result may occur with  improper specimen collection/handling, submission of specimen other than nasopharyngeal swab, presence of viral mutation(s) within the areas targeted by this assay, and inadequate number of viral copies (<131 copies/mL). A negative result must be combined with clinical observations, patient history, and epidemiological information. The expected result is Negative.  Fact Sheet for Patients:  PinkCheek.be  Fact Sheet for Healthcare Providers:  GravelBags.it  This test is no t yet approved or cleared by the [place] FDA and  has been authorized for detection and/or diagnosis of SARS-CoV-2 by FDA under an Emergency Use Authorization (EUA). This EUA will remain  in effect (meaning this test can be used) for the duration of the COVID-19 declaration under Section 564(b)(1) of the Act, 21 U.S.C. section 360bbb-3(b)(1), unless the authorization is terminated or revoked sooner.     Influenza A by PCR NEGATIVE NEGATIVE Final   Influenza B by PCR NEGATIVE NEGATIVE Final    Comment: (NOTE) The Xpert Xpress SARS-CoV-2/FLU/RSV assay is intended as an aid in  the diagnosis of influenza from Nasopharyngeal swab specimens and  should not be used as a sole basis for treatment. Nasal washings and  aspirates are unacceptable for Xpert Xpress SARS-CoV-2/FLU/RSV  testing.  Fact Sheet for Patients: PinkCheek.be  Fact Sheet for Healthcare  Providers: GravelBags.it  This test is not yet approved or cleared by the [place] FDA and  has been authorized for detection and/or diagnosis of SARS-CoV-2 by  FDA under an Emergency Use Authorization (EUA). This EUA will remain  in effect (meaning this test can be used) for the duration of the  Covid-19 declaration under Section 564(b)(1) of the Act, 21  U.S.C. section 360bbb-3(b)(1), unless the authorization is  terminated or revoked. Performed at [facility], [place] [address], [place], [place] [address]   SARS Coronavirus 2 by RT PCR (hospital order, performed in [facility] hospital lab) Nasopharyngeal Nasopharyngeal Swab     Status: None   Collection Time: 06/29/20  6:33 AM   Specimen: Nasopharyngeal Swab  Result Value Ref Range Status   SARS Coronavirus 2 NEGATIVE NEGATIVE Final    Comment: (NOTE) SARS-CoV-2 target nucleic acids are NOT DETECTED.  The SARS-CoV-2 RNA is generally detectable in upper and lower respiratory specimens during the acute phase of infection. The lowest concentration of SARS-CoV-2 viral copies this assay can detect is 250 copies / mL. A negative result does not preclude SARS-CoV-2 infection and should not be used as the sole basis for treatment or other patient management decisions.  A negative result may occur with improper specimen collection / handling, submission of specimen other than nasopharyngeal swab, presence of viral mutation(s) within the areas targeted by this  assay, and inadequate number of viral copies (<250 copies / mL). A negative result must be combined with clinical observations, patient history, and epidemiological information.  Fact Sheet for Patients:   StrictlyIdeas.no  Fact Sheet for Healthcare Providers: BankingDealers.co.za  This test is not yet approved or  cleared by the [place] FDA and has been authorized for detection and/or  diagnosis of SARS-CoV-2 by FDA under an Emergency Use Authorization (EUA).  This EUA will remain in effect (meaning this test can be used) for the duration of the COVID-19 declaration under Section 564(b)(1) of the Act, 21 U.S.C. section 360bbb-3(b)(1), unless the authorization is terminated or revoked sooner.  Performed at [facility], [place] [address] [place], [place] [address]      Labs: BNP (last 3 results) No results for input(s): BNP in the last 8760 hours. Basic Metabolic Panel: Recent Labs  Lab 06/24/20 0628 06/24/20 0628 06/25/20 0850 06/26/20 0804 06/27/20 0248 06/29/20 0304 06/30/20 0551  NA 136  --  137 135 136 137  --   K 4.4  --  5.1 3.9 4.1 4.6  --   CL 102  --  102 101 103 103  --   CO2 25  --  28 27 25 24   --   GLUCOSE 109*  --  101* 114* 128* 95  --   BUN 15  --  20 20 17 13   --   CREATININE 0.94   < > 0.96 0.85 0.81 0.72 0.72  CALCIUM 9.0  --  9.2 8.7* 8.6* 8.9  --    < > = values in this interval not displayed.   Liver Function Tests: Recent Labs  Lab 06/23/20 1715 06/24/20 0628  AST 28 19  ALT 21 16  ALKPHOS 85 69  BILITOT 1.1 1.0  PROT 7.3 6.4*  ALBUMIN 4.0 3.4*   No results for input(s): LIPASE, AMYLASE in the last 168 hours. No results for input(s): AMMONIA in the last 168 hours. CBC: Recent Labs  Lab 06/23/20 1715 06/23/20 1715 06/24/20 0628 06/25/20 0850 06/26/20 0804 06/28/20 0055 06/29/20 0304  WBC 9.7   < > 7.5 9.1 8.3 6.8 7.0  NEUTROABS 7.4  --   --   --   --   --   --   HGB 12.4   < > 10.9* 10.3* 10.1* 9.2* 9.8*  HCT 38.8   < > 33.9* 31.9* 31.3* 28.9* 31.2*  MCV 95.1   < > 93.6 95.5 93.2 95.7 95.1  PLT 237   < > 245 239 255 297 315   < > = values in this interval not displayed.   Cardiac Enzymes: Recent Labs  Lab 06/23/20 1715  CKTOTAL 104   BNP: Invalid input(s): POCBNP CBG: No results for input(s): GLUCAP in the last 168 hours. D-Dimer No results for input(s): DDIMER in the last 72 hours. Hgb A1c No  results for input(s): HGBA1C in the last 72 hours. Lipid Profile No results for input(s): CHOL, HDL, LDLCALC, TRIG, CHOLHDL, LDLDIRECT in the last 72 hours. Thyroid function studies No results for input(s): TSH, T4TOTAL, T3FREE, THYROIDAB in the last 72 hours.  Invalid input(s): FREET3 Anemia work up No results for input(s): VITAMINB12, FOLATE, FERRITIN, TIBC, IRON, RETICCTPCT in the last 72 hours. Urinalysis    Component Value Date/Time   COLORURINE STRAW (A) 06/27/2020 1613   APPEARANCEUR CLEAR 06/27/2020 1613   LABSPEC 1.004 (L) 06/27/2020 1613   PHURINE 7.0 06/27/2020 1613   [place] 06/27/2020 1613  HGBUR NEGATIVE 06/27/2020 1613   BILIRUBINUR NEGATIVE 06/27/2020 1613   KETONESUR NEGATIVE 06/27/2020 1613   PROTEINUR NEGATIVE 06/27/2020 1613   UROBILINOGEN 0.2 06/17/2015 2338   NITRITE NEGATIVE 06/27/2020 1613   LEUKOCYTESUR TRACE (A) 06/27/2020 1613   Sepsis Labs Invalid input(s): PROCALCITONIN,  WBC,  LACTICIDVEN Microbiology Recent Results (from the past 240 hour(s))  Respiratory Panel by RT PCR (Flu A&B, Covid) - Nasopharyngeal Swab     Status: None   Collection Time: 06/23/20  8:09 PM   Specimen: Nasopharyngeal Swab; Nasopharyngeal(NP) swabs in vial transport medium  Result Value Ref Range Status   SARS Coronavirus 2 by RT PCR NEGATIVE NEGATIVE Final    Comment: (NOTE) SARS-CoV-2 target nucleic acids are NOT DETECTED.  The SARS-CoV-2 RNA is generally detectable in upper respiratoy specimens during the acute phase of infection. The lowest concentration of SARS-CoV-2 viral copies this assay can detect is 131 copies/mL. A negative result does not preclude SARS-Cov-2 infection and should not be used as the sole basis for treatment or other patient management decisions. A negative result may occur with  improper specimen collection/handling, submission of specimen other than nasopharyngeal swab, presence of viral mutation(s) within the areas targeted by this  assay, and inadequate number of viral copies (<131 copies/mL). A negative result must be combined with clinical observations, patient history, and epidemiological information. The expected result is Negative.  Fact Sheet for Patients:  PinkCheek.be  Fact Sheet for Healthcare Providers:  GravelBags.it  This test is no t yet approved or cleared by the [place] FDA and  has been authorized for detection and/or diagnosis of SARS-CoV-2 by FDA under an Emergency Use Authorization (EUA). This EUA will remain  in effect (meaning this test can be used) for the duration of the COVID-19 declaration under Section 564(b)(1) of the Act, 21 U.S.C. section 360bbb-3(b)(1), unless the authorization is terminated or revoked sooner.     Influenza A by PCR NEGATIVE NEGATIVE Final   Influenza B by PCR NEGATIVE NEGATIVE Final    Comment: (NOTE) The Xpert Xpress SARS-CoV-2/FLU/RSV assay is intended as an aid in  the diagnosis of influenza from Nasopharyngeal swab specimens and  should not be used as a sole basis for treatment. Nasal washings and  aspirates are unacceptable for Xpert Xpress SARS-CoV-2/FLU/RSV  testing.  Fact Sheet for Patients: PinkCheek.be  Fact Sheet for Healthcare Providers: [email or web address]  This test is not yet approved or cleared by the [place] FDA and  has been authorized for detection and/or diagnosis of SARS-CoV-2 by  FDA under an Emergency Use Authorization (EUA). This EUA will remain  in effect (meaning this test can be used) for the duration of the  Covid-19 declaration under Section 564(b)(1) of the Act, 21  U.S.C. section 360bbb-3(b)(1), unless the authorization is  terminated or revoked. Performed at [facility], [place] [address] [place], [place] [address]   SARS Coronavirus 2 by RT PCR (hospital order, performed in [facility] hospital  lab) Nasopharyngeal Nasopharyngeal Swab     Status: None   Collection Time: 06/29/20  6:33 AM   Specimen: Nasopharyngeal Swab  Result Value Ref Range Status   SARS Coronavirus 2 NEGATIVE NEGATIVE Final    Comment: (NOTE) SARS-CoV-2 target nucleic acids are NOT DETECTED.  The SARS-CoV-2 RNA is generally detectable in upper and lower respiratory specimens during the acute phase of infection. The lowest concentration of SARS-CoV-2 viral copies this assay can detect is 250 copies / mL. A negative result does not preclude  SARS-CoV-2 infection and should not be used as the sole basis for treatment or other patient management decisions.  A negative result may occur with improper specimen collection / handling, submission of specimen other than nasopharyngeal swab, presence of viral mutation(s) within the areas targeted by this assay, and inadequate number of viral copies (<250 copies / mL). A negative result must be combined with clinical observations, patient history, and epidemiological information.  Fact Sheet for Patients:   StrictlyIdeas.no  Fact Sheet for Healthcare Providers: BankingDealers.co.za  This test is not yet approved or  cleared by the [place] FDA and has been authorized for detection and/or diagnosis of SARS-CoV-2 by FDA under an Emergency Use Authorization (EUA).  This EUA will remain in effect (meaning this test can be used) for the duration of the COVID-19 declaration under Section 564(b)(1) of the Act, 21 U.S.C. section 360bbb-3(b)(1), unless the authorization is terminated or revoked sooner.  Performed at [facility], [place] [address], [place], [place] [address]      Time coordinating discharge: Over 30 minutes  SIGNED:   Mckinley Jewel, MD  [facility] 06/30/2020, 2:39 PM Pager   If 7PM-7AM, please contact night-coverage [email or web address]

## 2020-06-30 NOTE — Consult Note (Signed)
[facility] Consultation Note  Referring Provider: [facility] Primary Care Physician:  Lajean Manes, MD  Reason for Consultation:  Nausea, failure to thrive  HPI: Gabrielle Hoffman is a 86 y.o. female whom we've been asked to see for above reasons.  Patient with suspected pelvic and vertebral fractures.  Has had nausea and failure to thrive for some time (months? Years?) with gradual weight loss (?).  Had esophagram showing esophageal dysmotility but no esophageal stricture/obstruction.  Patient notes today she has no nausea and has been tolerating her diet.  She denies abdominal pain, but feels she has been constipated and may feel better after that.  No blood in stool.  No hematemesis.   Past Medical History:  Diagnosis Date  . Asthma   . DDD (degenerative disc disease), lumbar   . Dementia    very mild   . Depression   . Disc disorder of cervical region   . Diverticulosis   . Hyperlipidemia   . Hypertension   . Hyponatremia 12/17/2011  . Kidney stones    "passed them"  . Memory loss   . Mitral valve prolapse   . Osteopenia   . Pneumonia    "I believe I had it a long time ago"  . Presence of permanent cardiac pacemaker   . Syncope   . UTI (lower urinary tract infection)   . Vaginal prolapse   . Venous insufficiency     Past Surgical History:  Procedure Laterality Date  . ABDOMINAL HYSTERECTOMY    . APPENDECTOMY    . EP IMPLANTABLE DEVICE N/A 08/27/2015   Procedure: Loop Recorder Insertion;  Surgeon: Evans Lance, MD;  Location: [facility];  Service: Cardiovascular;  Laterality: N/A;  . EP IMPLANTABLE DEVICE N/A 11/19/2015   Procedure: Pacemaker Implant;  Surgeon: Evans Lance, MD;  Location: [place] [facility];  Service: Cardiovascular;  Laterality: N/A;  . INSERT / REPLACE / REMOVE PACEMAKER  11/19/2015  . TONSILLECTOMY      Prior to Admission medications   Medication Sig Start Date End Date Taking? Authorizing Provider  dronabinol  (MARINOL) 2.5 MG capsule Take 2.5 mg by mouth daily as needed (appetite).   Yes [provider]  ibuprofen (ADVIL) 200 MG tablet Take 200 mg by mouth every 6 (six) hours as needed for moderate pain.   Yes [provider]  methimazole (TAPAZOLE) 10 MG tablet Take 10 mg by mouth daily. 03/14/20  Yes [provider]  ondansetron (ZOFRAN-ODT) 4 MG disintegrating tablet Take 4 mg by mouth every 8 (eight) hours as needed for nausea or vomiting.  05/03/20  Yes [provider]    Current Facility-Administered Medications  Medication Dose Route Frequency Provider Last Rate Last Admin  . 0.9 %  sodium chloride infusion   Intravenous Continuous Debbe Odea, MD   Stopped at 06/28/20 1624  . acetaminophen (TYLENOL) tablet 1,000 mg  1,000 mg Oral QID Debbe Odea, MD   1,000 mg at 06/30/20 1018  . docusate sodium (COLACE) capsule 100 mg  100 mg Oral Daily Pahwani, Rinka R, MD   100 mg at 06/30/20 1017  . enoxaparin (LOVENOX) injection 40 mg  40 mg Subcutaneous Q24H Marcelyn Bruins, MD   40 mg at 06/29/20 2219  . famotidine (PEPCID) tablet 20 mg  20 mg Oral BID Debbe Odea, MD   20 mg at 06/30/20 1018  . feeding supplement (ENSURE ENLIVE / ENSURE PLUS) liquid 237 mL  237 mL Oral TID  BM Debbe Odea, MD   237 mL at 06/28/20 1340  . hydrALAZINE (APRESOLINE) injection 10 mg  10 mg Intravenous Q6H PRN Pahwani, Rinka R, MD      . influenza vaccine adjuvanted (FLUAD) injection 0.5 mL  0.5 mL Intramuscular Tomorrow-1000 Pahwani, Rinka R, MD      . methimazole (TAPAZOLE) tablet 10 mg  10 mg Oral Daily Marcelyn Bruins, MD   10 mg at 06/30/20 1018  . metoCLOPramide (REGLAN) injection 5 mg  5 mg Intravenous Q6H Pahwani, Rinka R, MD   5 mg at 06/30/20 0600  . ondansetron (ZOFRAN) injection 4 mg  4 mg Intravenous QID Debbe Odea, MD   4 mg at 06/30/20 1017  . pantoprazole (PROTONIX) EC tablet 40 mg  40 mg Oral QAC supper Debbe Odea, MD   40 mg at 06/29/20 1705  .  polyethylene glycol (MIRALAX / GLYCOLAX) packet 17 g  17 g Oral Daily Pahwani, Rinka R, MD   17 g at 06/29/20 0936  . promethazine (PHENERGAN) injection 12.5 mg  12.5 mg Intravenous Q6H PRN Pahwani, Rinka R, MD   12.5 mg at 06/30/20 0655  . sodium chloride flush (NS) 0.9 % injection 3 mL  3 mL Intravenous Q12H Marcelyn Bruins, MD   3 mL at 06/29/20 2219    Allergies as of 06/23/2020 - Review Complete 06/23/2020  Allergen Reaction Noted  . Sulfa antibiotics Rash 07/10/2015  . Sulfamethoxazole Rash 07/11/2015    Family History  Problem Relation Age of Onset  . Cancer Sister     Social History   Socioeconomic History  . Marital status: Widowed    Spouse name: Not on file  . Number of children: Not on file  . Years of education: Not on file  . Highest education level: Not on file  Occupational History  . Not on file  Tobacco Use  . Smoking status: Never Smoker  . Smokeless tobacco: Never Used  Vaping Use  . Vaping Use: Never used  Substance and Sexual Activity  . Alcohol use: No  . Drug use: No  . Sexual activity: Not Currently    Birth control/protection: Post-menopausal  Other Topics Concern  . Not on file  Social History Narrative  . Not on file   Social Determinants of Health   Financial Resource Strain:   . Difficulty of Paying Living Expenses: Not on file  Food Insecurity:   . Worried About Charity fundraiser in the Last Year: Not on file  . Ran Out of Food in the Last Year: Not on file  Transportation Needs:   . Lack of Transportation (Medical): Not on file  . Lack of Transportation (Non-Medical): Not on file  Physical Activity:   . Days of Exercise per Week: Not on file  . Minutes of Exercise per Session: Not on file  Stress:   . Feeling of Stress : Not on file  Social Connections:   . Frequency of Communication with Friends and Family: Not on file  . Frequency of Social Gatherings with Friends and Family: Not on file  . Attends Religious Services:  Not on file  . Active Member of Clubs or Organizations: Not on file  . Attends Archivist Meetings: Not on file  . Marital Status: Not on file  Intimate Partner Violence:   . Fear of Current or Ex-Partner: Not on file  . Emotionally Abused: Not on file  . Physically Abused: Not on file  . Sexually Abused:  Not on file    Review of Systems: As per HPI, all others negative  Physical Exam: Vital signs in last 24 hours: Temp:  [98.4 F (36.9 C)-98.7 F (37.1 C)] 98.4 F (36.9 C) (11/27 0815) Pulse Rate:  [93-104] 94 (11/27 0815) Resp:  [17-18] 17 (11/27 0815) BP: (146-183)/(55-97) 154/55 (11/27 0815) SpO2:  [98 %] 98 % (11/27 0815) Weight:  [48.8 kg] 48.8 kg (11/27 0500) Last BM Date: 06/23/20 General:   Alert,  Thin, frail, chronically ill-appearing Head:  Normocephalic and atraumatic. Eyes:  Sclera clear, no icterus.   Conjunctiva pink. Ears:  Normal auditory acuity. Nose:  No deformity, discharge,  or lesions. Mouth:  No deformity or lesions.  Oropharynx pink & moist. Neck:  Supple; no masses or thyromegaly. Abdomen:  Soft, nontender and nondistended. No masses, hepatosplenomegaly or hernias noted. Normal bowel sounds, without guarding, and without rebound.     Msk:  Diffuse muscular atrophy, Symmetrical without gross deformities. Normal posture. Pulses:  Normal pulses noted. Extremities:  Without clubbing or edema. Neurologic:  Alert and  oriented x4; limited mobility from fractures but grossly normal neurologically. Skin:  Thin and fragile; Intact without significant lesions or rashes. Cervical Nodes:  No significant cervical adenopathy. Psych:  Alert and cooperative. Normal mood and affect.   Lab Results: Recent Labs    06/28/20 0055 06/29/20 0304  WBC 6.8 7.0  HGB 9.2* 9.8*  HCT 28.9* 31.2*  PLT 297 315   BMET Recent Labs    06/29/20 0304 06/30/20 0551  NA 137  --   K 4.6  --   CL 103  --   CO2 24  --   GLUCOSE 95  --   BUN 13  --    CREATININE 0.72 0.72  CALCIUM 8.9  --    LFT No results for input(s): PROT, ALBUMIN, AST, ALT, ALKPHOS, BILITOT, BILIDIR, IBILI in the last 72 hours. PT/INR No results for input(s): LABPROT, INR in the last 72 hours.  Studies/Results: DG ESOPHAGUS W SINGLE CM (SOL OR THIN BA)  Result Date: 06/29/2020 CLINICAL DATA:  Loss of weight.  Difficulty swallowing. EXAM: ESOPHOGRAM/BARIUM SWALLOW TECHNIQUE: Single contrast examination was performed using  thin barium. FLUOROSCOPY TIME:  Fluoroscopy Time:  42 seconds Radiation Exposure Index (if provided by the fluoroscopic device): 2.1 mGy Number of Acquired Spot Images: 1 COMPARISON:  None. FINDINGS: A limited examination was performed using thin barium. The patient was in the semi upright LPO orientation. The patient took several swallows of the enteric contrast material which passes through the esophagus and into the stomach. No signs of high-grade stricture or obstructing mass. Signs of esophageal dysmotility noted with multiple non propulsive tertiary waves with to and fro stasis enteric contrast material noted in the mid and distal esophagus IMPRESSION: 1. No evidence for esophageal obstruction. 2. Moderate esophageal dysmotility. Electronically Signed   By: Kerby Moors M.D.   On: 06/29/2020 13:32    Impression:  1.  Nausea and failure to thrive.  Ongoing problems for several months per patient; she reports she is feeling much better today and has no nausea. 2.  Weight loss.  Plan:  1.  Patient's nausea and failure to thrive likely multifactorial (sedentary condition after multiple bony injuries/fractures; narcotics for pain control; other new medications, constipation from decreased mobility and narcotics). 2.  Would recommend supportive measures (scheduled antiemetics) and advance diet as tolerated. 3.  Would not pursue any further GI work-up or testing at this time.  Would be very  reluctant to pursue endoscopy in absence of overt bleeding  or some emergent scenario.  These symptoms wax/wane (today she is asymptomatic) and have been ongoing for a long time, and her multiple bony fractures are by far the most pressing issues for patient. 4.  [facility] will sign-off; please call with questions; we can see patient as outpatient for these symptoms, if desired, but even so conservative non-procedural work-up would likely be pursued given her age and fractures and comorbidities.  Thank you for the consultation.   LOS: 6 days   Karigan Cloninger M  06/30/2020, 11:12 AM  Cell [phone] If no answer or after 5 PM call [phone]

## 2020-07-04 ENCOUNTER — Telehealth: Payer: Self-pay

## 2020-07-04 DIAGNOSIS — K5900 Constipation, unspecified: Secondary | ICD-10-CM | POA: Diagnosis not present

## 2020-07-04 DIAGNOSIS — R110 Nausea: Secondary | ICD-10-CM | POA: Diagnosis not present

## 2020-07-04 DIAGNOSIS — R630 Anorexia: Secondary | ICD-10-CM | POA: Diagnosis not present

## 2020-07-04 DIAGNOSIS — E860 Dehydration: Secondary | ICD-10-CM | POA: Diagnosis not present

## 2020-07-04 NOTE — Telephone Encounter (Signed)
(  11:04am) SW left a message for patient's son-[name] requesting a call back to check patient status and schedule visit.

## 2020-07-05 DIAGNOSIS — E0590 Thyrotoxicosis, unspecified without thyrotoxic crisis or storm: Secondary | ICD-10-CM | POA: Diagnosis not present

## 2020-07-05 DIAGNOSIS — R630 Anorexia: Secondary | ICD-10-CM | POA: Diagnosis not present

## 2020-07-05 DIAGNOSIS — F419 Anxiety disorder, unspecified: Secondary | ICD-10-CM | POA: Diagnosis not present

## 2020-07-05 DIAGNOSIS — F32A Depression, unspecified: Secondary | ICD-10-CM | POA: Diagnosis not present

## 2020-07-06 ENCOUNTER — Telehealth: Payer: Self-pay | Admitting: Internal Medicine

## 2020-07-06 NOTE — Telephone Encounter (Signed)
Pt c/o BP issue: STAT if pt c/o blurred vision, one-sided weakness or slurred speech  1. What are your last 5 BP readings?  163/80  2. Are you having any other symptoms (ex. Dizziness, headache, blurred vision, passed out)? Nausea   3. What is your BP issue? Patient's son states the patient's BP has been elevated

## 2020-07-06 NOTE — Telephone Encounter (Signed)
I spoke with patient's son. Patient was recently hospitalized with fracture of pelvis.  Was discharged to rehab facility.  Son reports patient's BP was 163/80 at midnight last night.  Other readings in 150's. Son states nurse told him readings were going to be reported to PA at facility.  Son reports patient is in a great deal of pain at times.  He is concerned about patient's BP.  I asked him to follow up with provider at facility regarding this. Son asked I make Dr Lovena Le aware of recent hospitalization.

## 2020-07-10 ENCOUNTER — Other Ambulatory Visit: Payer: Self-pay | Admitting: *Deleted

## 2020-07-10 NOTE — Patient Outreach (Signed)
[facility] Post- Acute Care Coordinator follow up. Member screened for potential [facility] Care Management needs as a benefit of [place] [employer].  Per [facility] Patient Pearletha [place]ge member resides in [facility]. Update received from SNF SW indicating transition plan is likely to return home. [facility] palliative was active prior to admission. Communication sent to [facility] palliative to make aware member is residing in [facility]  currently.   Will continue to follow for potential [facility] Care Management needs.    Marthenia Rolling, MSN, RN,BSN [facility] Acute Care Coordinator [phone] [facility]) [phone]  (Toll free office)

## 2020-07-11 DIAGNOSIS — F419 Anxiety disorder, unspecified: Secondary | ICD-10-CM | POA: Diagnosis not present

## 2020-07-11 DIAGNOSIS — E860 Dehydration: Secondary | ICD-10-CM | POA: Diagnosis not present

## 2020-07-11 DIAGNOSIS — K5900 Constipation, unspecified: Secondary | ICD-10-CM | POA: Diagnosis not present

## 2020-07-11 DIAGNOSIS — D649 Anemia, unspecified: Secondary | ICD-10-CM | POA: Diagnosis not present

## 2020-07-11 DIAGNOSIS — R110 Nausea: Secondary | ICD-10-CM | POA: Diagnosis not present

## 2020-07-11 DIAGNOSIS — R630 Anorexia: Secondary | ICD-10-CM | POA: Diagnosis not present

## 2020-07-16 DIAGNOSIS — R918 Other nonspecific abnormal finding of lung field: Secondary | ICD-10-CM | POA: Diagnosis not present

## 2020-07-16 DIAGNOSIS — K5900 Constipation, unspecified: Secondary | ICD-10-CM | POA: Diagnosis not present

## 2020-07-16 DIAGNOSIS — E0590 Thyrotoxicosis, unspecified without thyrotoxic crisis or storm: Secondary | ICD-10-CM | POA: Diagnosis not present

## 2020-07-16 DIAGNOSIS — R110 Nausea: Secondary | ICD-10-CM | POA: Diagnosis not present

## 2020-07-16 DIAGNOSIS — I5022 Chronic systolic (congestive) heart failure: Secondary | ICD-10-CM | POA: Diagnosis not present

## 2020-07-16 DIAGNOSIS — R630 Anorexia: Secondary | ICD-10-CM | POA: Diagnosis not present

## 2020-07-16 DIAGNOSIS — E860 Dehydration: Secondary | ICD-10-CM | POA: Diagnosis not present

## 2020-07-16 DIAGNOSIS — D649 Anemia, unspecified: Secondary | ICD-10-CM | POA: Diagnosis not present

## 2020-07-17 DIAGNOSIS — K567 Ileus, unspecified: Secondary | ICD-10-CM | POA: Diagnosis not present

## 2020-07-18 DIAGNOSIS — F419 Anxiety disorder, unspecified: Secondary | ICD-10-CM | POA: Diagnosis not present

## 2020-07-18 DIAGNOSIS — I5022 Chronic systolic (congestive) heart failure: Secondary | ICD-10-CM | POA: Diagnosis not present

## 2020-07-18 DIAGNOSIS — D649 Anemia, unspecified: Secondary | ICD-10-CM | POA: Diagnosis not present

## 2020-07-18 DIAGNOSIS — F32A Depression, unspecified: Secondary | ICD-10-CM | POA: Diagnosis not present

## 2020-07-18 DIAGNOSIS — R630 Anorexia: Secondary | ICD-10-CM | POA: Diagnosis not present

## 2020-07-18 DIAGNOSIS — R110 Nausea: Secondary | ICD-10-CM | POA: Diagnosis not present

## 2020-07-18 DIAGNOSIS — K5900 Constipation, unspecified: Secondary | ICD-10-CM | POA: Diagnosis not present

## 2020-07-20 NOTE — Telephone Encounter (Signed)
I suspect that the bp is elevated due to pain. I would not change meds at this time. Lowering her bp could result in more falls.

## 2020-07-25 DIAGNOSIS — R630 Anorexia: Secondary | ICD-10-CM | POA: Diagnosis not present

## 2020-07-25 DIAGNOSIS — D649 Anemia, unspecified: Secondary | ICD-10-CM | POA: Diagnosis not present

## 2020-07-25 DIAGNOSIS — E871 Hypo-osmolality and hyponatremia: Secondary | ICD-10-CM | POA: Diagnosis not present

## 2020-07-25 DIAGNOSIS — R110 Nausea: Secondary | ICD-10-CM | POA: Diagnosis not present

## 2020-07-25 DIAGNOSIS — I5022 Chronic systolic (congestive) heart failure: Secondary | ICD-10-CM | POA: Diagnosis not present

## 2020-07-25 DIAGNOSIS — F32A Depression, unspecified: Secondary | ICD-10-CM | POA: Diagnosis not present

## 2020-07-25 DIAGNOSIS — R5381 Other malaise: Secondary | ICD-10-CM | POA: Diagnosis not present

## 2020-07-25 DIAGNOSIS — K219 Gastro-esophageal reflux disease without esophagitis: Secondary | ICD-10-CM | POA: Diagnosis not present

## 2020-07-25 DIAGNOSIS — K5900 Constipation, unspecified: Secondary | ICD-10-CM | POA: Diagnosis not present

## 2020-07-27 DIAGNOSIS — I5022 Chronic systolic (congestive) heart failure: Secondary | ICD-10-CM | POA: Diagnosis not present

## 2020-07-27 DIAGNOSIS — M8580 Other specified disorders of bone density and structure, unspecified site: Secondary | ICD-10-CM | POA: Diagnosis not present

## 2020-07-27 DIAGNOSIS — M545 Low back pain, unspecified: Secondary | ICD-10-CM | POA: Diagnosis not present

## 2020-07-27 DIAGNOSIS — F32A Depression, unspecified: Secondary | ICD-10-CM | POA: Diagnosis not present

## 2020-07-27 DIAGNOSIS — E785 Hyperlipidemia, unspecified: Secondary | ICD-10-CM | POA: Diagnosis not present

## 2020-07-27 DIAGNOSIS — F0390 Unspecified dementia without behavioral disturbance: Secondary | ICD-10-CM | POA: Diagnosis not present

## 2020-07-27 DIAGNOSIS — G8929 Other chronic pain: Secondary | ICD-10-CM | POA: Diagnosis not present

## 2020-07-27 DIAGNOSIS — S3282XD Multiple fractures of pelvis without disruption of pelvic ring, subsequent encounter for fracture with routine healing: Secondary | ICD-10-CM | POA: Diagnosis not present

## 2020-07-27 DIAGNOSIS — L89151 Pressure ulcer of sacral region, stage 1: Secondary | ICD-10-CM | POA: Diagnosis not present

## 2020-07-27 DIAGNOSIS — I110 Hypertensive heart disease with heart failure: Secondary | ICD-10-CM | POA: Diagnosis not present

## 2020-07-27 DIAGNOSIS — Z950 Presence of cardiac pacemaker: Secondary | ICD-10-CM | POA: Diagnosis not present

## 2020-07-27 DIAGNOSIS — S32591D Other specified fracture of right pubis, subsequent encounter for fracture with routine healing: Secondary | ICD-10-CM | POA: Diagnosis not present

## 2020-07-27 DIAGNOSIS — J45909 Unspecified asthma, uncomplicated: Secondary | ICD-10-CM | POA: Diagnosis not present

## 2020-07-27 DIAGNOSIS — F419 Anxiety disorder, unspecified: Secondary | ICD-10-CM | POA: Diagnosis not present

## 2020-07-27 DIAGNOSIS — Z9181 History of falling: Secondary | ICD-10-CM | POA: Diagnosis not present

## 2020-07-27 DIAGNOSIS — W19XXXD Unspecified fall, subsequent encounter: Secondary | ICD-10-CM | POA: Diagnosis not present

## 2020-07-30 DIAGNOSIS — K2100 Gastro-esophageal reflux disease with esophagitis, without bleeding: Secondary | ICD-10-CM | POA: Diagnosis not present

## 2020-07-30 DIAGNOSIS — R110 Nausea: Secondary | ICD-10-CM | POA: Diagnosis not present

## 2020-07-30 DIAGNOSIS — R059 Cough, unspecified: Secondary | ICD-10-CM | POA: Diagnosis not present

## 2020-07-30 DIAGNOSIS — K5900 Constipation, unspecified: Secondary | ICD-10-CM | POA: Diagnosis not present

## 2020-07-30 DIAGNOSIS — G8929 Other chronic pain: Secondary | ICD-10-CM | POA: Diagnosis not present

## 2020-07-30 DIAGNOSIS — I110 Hypertensive heart disease with heart failure: Secondary | ICD-10-CM | POA: Diagnosis not present

## 2020-07-30 DIAGNOSIS — I5022 Chronic systolic (congestive) heart failure: Secondary | ICD-10-CM | POA: Diagnosis not present

## 2020-07-30 DIAGNOSIS — W19XXXD Unspecified fall, subsequent encounter: Secondary | ICD-10-CM | POA: Diagnosis not present

## 2020-07-30 DIAGNOSIS — M545 Low back pain, unspecified: Secondary | ICD-10-CM | POA: Diagnosis not present

## 2020-07-30 DIAGNOSIS — S3282XD Multiple fractures of pelvis without disruption of pelvic ring, subsequent encounter for fracture with routine healing: Secondary | ICD-10-CM | POA: Diagnosis not present

## 2020-08-01 ENCOUNTER — Other Ambulatory Visit: Payer: Self-pay | Admitting: Physician Assistant

## 2020-08-01 DIAGNOSIS — G8929 Other chronic pain: Secondary | ICD-10-CM | POA: Diagnosis not present

## 2020-08-01 DIAGNOSIS — S3282XD Multiple fractures of pelvis without disruption of pelvic ring, subsequent encounter for fracture with routine healing: Secondary | ICD-10-CM | POA: Diagnosis not present

## 2020-08-01 DIAGNOSIS — M545 Low back pain, unspecified: Secondary | ICD-10-CM | POA: Diagnosis not present

## 2020-08-01 DIAGNOSIS — R110 Nausea: Secondary | ICD-10-CM

## 2020-08-01 DIAGNOSIS — I5022 Chronic systolic (congestive) heart failure: Secondary | ICD-10-CM | POA: Diagnosis not present

## 2020-08-01 DIAGNOSIS — W19XXXD Unspecified fall, subsequent encounter: Secondary | ICD-10-CM | POA: Diagnosis not present

## 2020-08-01 DIAGNOSIS — I110 Hypertensive heart disease with heart failure: Secondary | ICD-10-CM | POA: Diagnosis not present

## 2020-08-02 ENCOUNTER — Ambulatory Visit
Admission: RE | Admit: 2020-08-02 | Discharge: 2020-08-02 | Disposition: A | Discharge status: Home / Self Care | Payer: Medicare Other | Source: Ambulatory Visit | Attending: Physician Assistant | Admitting: Physician Assistant

## 2020-08-02 ENCOUNTER — Other Ambulatory Visit: Payer: Self-pay | Admitting: Physician Assistant

## 2020-08-02 DIAGNOSIS — S32511A Fracture of superior rim of right pubis, initial encounter for closed fracture: Secondary | ICD-10-CM | POA: Diagnosis not present

## 2020-08-02 DIAGNOSIS — R059 Cough, unspecified: Secondary | ICD-10-CM

## 2020-08-02 DIAGNOSIS — S32591A Other specified fracture of right pubis, initial encounter for closed fracture: Secondary | ICD-10-CM | POA: Diagnosis not present

## 2020-08-02 DIAGNOSIS — R110 Nausea: Secondary | ICD-10-CM | POA: Diagnosis not present

## 2020-08-02 DIAGNOSIS — N281 Cyst of kidney, acquired: Secondary | ICD-10-CM | POA: Diagnosis not present

## 2020-08-02 MED ORDER — IOPAMIDOL (ISOVUE-300) INJECTION 61%
100.0000 mL | Freq: Once | INTRAVENOUS | Status: AC | PRN
  Administered 2020-08-02: 100 mL via INTRAVENOUS

## 2020-08-06 DIAGNOSIS — W19XXXD Unspecified fall, subsequent encounter: Secondary | ICD-10-CM | POA: Diagnosis not present

## 2020-08-06 DIAGNOSIS — G8929 Other chronic pain: Secondary | ICD-10-CM | POA: Diagnosis not present

## 2020-08-06 DIAGNOSIS — M545 Low back pain, unspecified: Secondary | ICD-10-CM | POA: Diagnosis not present

## 2020-08-06 DIAGNOSIS — S3282XD Multiple fractures of pelvis without disruption of pelvic ring, subsequent encounter for fracture with routine healing: Secondary | ICD-10-CM | POA: Diagnosis not present

## 2020-08-06 DIAGNOSIS — I5022 Chronic systolic (congestive) heart failure: Secondary | ICD-10-CM | POA: Diagnosis not present

## 2020-08-06 DIAGNOSIS — I110 Hypertensive heart disease with heart failure: Secondary | ICD-10-CM | POA: Diagnosis not present

## 2020-08-07 DIAGNOSIS — I5022 Chronic systolic (congestive) heart failure: Secondary | ICD-10-CM | POA: Diagnosis not present

## 2020-08-07 DIAGNOSIS — I10 Essential (primary) hypertension: Secondary | ICD-10-CM | POA: Diagnosis not present

## 2020-08-07 DIAGNOSIS — M545 Low back pain, unspecified: Secondary | ICD-10-CM | POA: Diagnosis not present

## 2020-08-07 DIAGNOSIS — R110 Nausea: Secondary | ICD-10-CM | POA: Diagnosis not present

## 2020-08-07 DIAGNOSIS — F0280 Dementia in other diseases classified elsewhere without behavioral disturbance: Secondary | ICD-10-CM | POA: Diagnosis not present

## 2020-08-07 DIAGNOSIS — W19XXXD Unspecified fall, subsequent encounter: Secondary | ICD-10-CM | POA: Diagnosis not present

## 2020-08-07 DIAGNOSIS — K5900 Constipation, unspecified: Secondary | ICD-10-CM | POA: Diagnosis not present

## 2020-08-07 DIAGNOSIS — L8990 Pressure ulcer of unspecified site, unspecified stage: Secondary | ICD-10-CM | POA: Diagnosis not present

## 2020-08-07 DIAGNOSIS — G8929 Other chronic pain: Secondary | ICD-10-CM | POA: Diagnosis not present

## 2020-08-07 DIAGNOSIS — I110 Hypertensive heart disease with heart failure: Secondary | ICD-10-CM | POA: Diagnosis not present

## 2020-08-07 DIAGNOSIS — G301 Alzheimer's disease with late onset: Secondary | ICD-10-CM | POA: Diagnosis not present

## 2020-08-07 DIAGNOSIS — R269 Unspecified abnormalities of gait and mobility: Secondary | ICD-10-CM | POA: Diagnosis not present

## 2020-08-07 DIAGNOSIS — S3282XD Multiple fractures of pelvis without disruption of pelvic ring, subsequent encounter for fracture with routine healing: Secondary | ICD-10-CM | POA: Diagnosis not present

## 2020-08-08 DIAGNOSIS — W19XXXD Unspecified fall, subsequent encounter: Secondary | ICD-10-CM | POA: Diagnosis not present

## 2020-08-08 DIAGNOSIS — G8929 Other chronic pain: Secondary | ICD-10-CM | POA: Diagnosis not present

## 2020-08-08 DIAGNOSIS — M545 Low back pain, unspecified: Secondary | ICD-10-CM | POA: Diagnosis not present

## 2020-08-08 DIAGNOSIS — I110 Hypertensive heart disease with heart failure: Secondary | ICD-10-CM | POA: Diagnosis not present

## 2020-08-08 DIAGNOSIS — I5022 Chronic systolic (congestive) heart failure: Secondary | ICD-10-CM | POA: Diagnosis not present

## 2020-08-08 DIAGNOSIS — S3282XD Multiple fractures of pelvis without disruption of pelvic ring, subsequent encounter for fracture with routine healing: Secondary | ICD-10-CM | POA: Diagnosis not present

## 2020-08-10 ENCOUNTER — Ambulatory Visit (INDEPENDENT_AMBULATORY_CARE_PROVIDER_SITE_OTHER): Payer: Medicare Other

## 2020-08-10 DIAGNOSIS — W19XXXD Unspecified fall, subsequent encounter: Secondary | ICD-10-CM | POA: Diagnosis not present

## 2020-08-10 DIAGNOSIS — I110 Hypertensive heart disease with heart failure: Secondary | ICD-10-CM | POA: Diagnosis not present

## 2020-08-10 DIAGNOSIS — R55 Syncope and collapse: Secondary | ICD-10-CM | POA: Diagnosis not present

## 2020-08-10 DIAGNOSIS — G8929 Other chronic pain: Secondary | ICD-10-CM | POA: Diagnosis not present

## 2020-08-10 DIAGNOSIS — M545 Low back pain, unspecified: Secondary | ICD-10-CM | POA: Diagnosis not present

## 2020-08-10 DIAGNOSIS — I5022 Chronic systolic (congestive) heart failure: Secondary | ICD-10-CM | POA: Diagnosis not present

## 2020-08-10 DIAGNOSIS — S3282XD Multiple fractures of pelvis without disruption of pelvic ring, subsequent encounter for fracture with routine healing: Secondary | ICD-10-CM | POA: Diagnosis not present

## 2020-08-11 LAB — CUP PACEART REMOTE DEVICE CHECK
Battery Remaining Longevity: 131 mo
Battery Remaining Percentage: 95.5 %
Battery Voltage: 2.99 V
Brady Statistic AP VP Percent: 1 %
Brady Statistic AP VS Percent: 4.1 %
Brady Statistic AS VP Percent: 1 %
Brady Statistic AS VS Percent: 93 %
Brady Statistic RA Percent Paced: 1.6 %
Brady Statistic RV Percent Paced: 1 %
Date Time Interrogation Session: 20220107020016
Implantable Lead Implant Date: 20170417
Implantable Lead Implant Date: 20170417
Implantable Lead Location: 753859
Implantable Lead Location: 753860
Implantable Pulse Generator Implant Date: 20170417
Lead Channel Impedance Value: 400 Ohm
Lead Channel Impedance Value: 400 Ohm
Lead Channel Pacing Threshold Amplitude: 0.5 V
Lead Channel Pacing Threshold Amplitude: 0.75 V
Lead Channel Pacing Threshold Pulse Width: 0.5 ms
Lead Channel Pacing Threshold Pulse Width: 0.5 ms
Lead Channel Sensing Intrinsic Amplitude: 0.5 mV
Lead Channel Sensing Intrinsic Amplitude: 11.1 mV
Lead Channel Setting Pacing Amplitude: 1 V
Lead Channel Setting Pacing Amplitude: 1.5 V
Lead Channel Setting Pacing Pulse Width: 0.5 ms
Lead Channel Setting Sensing Sensitivity: 2 mV
Pulse Gen Model: 2272
Pulse Gen Serial Number: 7883162

## 2020-08-13 DIAGNOSIS — W19XXXD Unspecified fall, subsequent encounter: Secondary | ICD-10-CM | POA: Diagnosis not present

## 2020-08-13 DIAGNOSIS — M545 Low back pain, unspecified: Secondary | ICD-10-CM | POA: Diagnosis not present

## 2020-08-13 DIAGNOSIS — G8929 Other chronic pain: Secondary | ICD-10-CM | POA: Diagnosis not present

## 2020-08-13 DIAGNOSIS — I5022 Chronic systolic (congestive) heart failure: Secondary | ICD-10-CM | POA: Diagnosis not present

## 2020-08-13 DIAGNOSIS — I110 Hypertensive heart disease with heart failure: Secondary | ICD-10-CM | POA: Diagnosis not present

## 2020-08-13 DIAGNOSIS — S3282XD Multiple fractures of pelvis without disruption of pelvic ring, subsequent encounter for fracture with routine healing: Secondary | ICD-10-CM | POA: Diagnosis not present

## 2020-08-14 DIAGNOSIS — G8929 Other chronic pain: Secondary | ICD-10-CM | POA: Diagnosis not present

## 2020-08-14 DIAGNOSIS — I5022 Chronic systolic (congestive) heart failure: Secondary | ICD-10-CM | POA: Diagnosis not present

## 2020-08-14 DIAGNOSIS — S3282XD Multiple fractures of pelvis without disruption of pelvic ring, subsequent encounter for fracture with routine healing: Secondary | ICD-10-CM | POA: Diagnosis not present

## 2020-08-14 DIAGNOSIS — I110 Hypertensive heart disease with heart failure: Secondary | ICD-10-CM | POA: Diagnosis not present

## 2020-08-14 DIAGNOSIS — W19XXXD Unspecified fall, subsequent encounter: Secondary | ICD-10-CM | POA: Diagnosis not present

## 2020-08-14 DIAGNOSIS — M545 Low back pain, unspecified: Secondary | ICD-10-CM | POA: Diagnosis not present

## 2020-08-15 DIAGNOSIS — W19XXXD Unspecified fall, subsequent encounter: Secondary | ICD-10-CM | POA: Diagnosis not present

## 2020-08-15 DIAGNOSIS — G8929 Other chronic pain: Secondary | ICD-10-CM | POA: Diagnosis not present

## 2020-08-15 DIAGNOSIS — M545 Low back pain, unspecified: Secondary | ICD-10-CM | POA: Diagnosis not present

## 2020-08-15 DIAGNOSIS — I5022 Chronic systolic (congestive) heart failure: Secondary | ICD-10-CM | POA: Diagnosis not present

## 2020-08-15 DIAGNOSIS — S3282XD Multiple fractures of pelvis without disruption of pelvic ring, subsequent encounter for fracture with routine healing: Secondary | ICD-10-CM | POA: Diagnosis not present

## 2020-08-15 DIAGNOSIS — I110 Hypertensive heart disease with heart failure: Secondary | ICD-10-CM | POA: Diagnosis not present

## 2020-08-16 ENCOUNTER — Telehealth: Payer: Self-pay | Admitting: Internal Medicine

## 2020-08-16 NOTE — Telephone Encounter (Signed)
Patient's nurse called and mentionned that the patient weighted 96.4 lbs yesterday. Now the patient weighs 102 lbs. Please call back

## 2020-08-16 NOTE — Telephone Encounter (Signed)
Patient's son called back. Patient has gained 5 lbs in one week. Patient also complaining of dry cough. Patient denies any SOB, swelling, or CP. Patient has home health that her PCP has been following.  Made patient the earliest appointment with PA to be evaluated on 08/29/20. Encouraged patient's son to call patient's PCP. Patient's son stated that PCP has not done anything for patient, that is why home health reached out to Dr. Lovena Le. Informed patient's son that a message would be sent to Dr. Lovena Le for advisement, and he really needs to follow up with patient's PCP.

## 2020-08-16 NOTE — Telephone Encounter (Signed)
Left message for patient to call back  

## 2020-08-20 DIAGNOSIS — S3282XD Multiple fractures of pelvis without disruption of pelvic ring, subsequent encounter for fracture with routine healing: Secondary | ICD-10-CM | POA: Diagnosis not present

## 2020-08-20 DIAGNOSIS — G8929 Other chronic pain: Secondary | ICD-10-CM | POA: Diagnosis not present

## 2020-08-20 DIAGNOSIS — W19XXXD Unspecified fall, subsequent encounter: Secondary | ICD-10-CM | POA: Diagnosis not present

## 2020-08-20 DIAGNOSIS — I5022 Chronic systolic (congestive) heart failure: Secondary | ICD-10-CM | POA: Diagnosis not present

## 2020-08-20 DIAGNOSIS — I110 Hypertensive heart disease with heart failure: Secondary | ICD-10-CM | POA: Diagnosis not present

## 2020-08-20 DIAGNOSIS — M545 Low back pain, unspecified: Secondary | ICD-10-CM | POA: Diagnosis not present

## 2020-08-21 ENCOUNTER — Other Ambulatory Visit: Payer: Medicare Other

## 2020-08-21 DIAGNOSIS — S3282XD Multiple fractures of pelvis without disruption of pelvic ring, subsequent encounter for fracture with routine healing: Secondary | ICD-10-CM | POA: Diagnosis not present

## 2020-08-21 DIAGNOSIS — I5022 Chronic systolic (congestive) heart failure: Secondary | ICD-10-CM | POA: Diagnosis not present

## 2020-08-21 DIAGNOSIS — G8929 Other chronic pain: Secondary | ICD-10-CM | POA: Diagnosis not present

## 2020-08-21 DIAGNOSIS — I110 Hypertensive heart disease with heart failure: Secondary | ICD-10-CM | POA: Diagnosis not present

## 2020-08-21 DIAGNOSIS — W19XXXD Unspecified fall, subsequent encounter: Secondary | ICD-10-CM | POA: Diagnosis not present

## 2020-08-21 DIAGNOSIS — M545 Low back pain, unspecified: Secondary | ICD-10-CM | POA: Diagnosis not present

## 2020-08-21 NOTE — Telephone Encounter (Signed)
Have patient come into see Andy/Renae on a day I am in office ASAP

## 2020-08-22 DIAGNOSIS — R059 Cough, unspecified: Secondary | ICD-10-CM | POA: Diagnosis not present

## 2020-08-22 DIAGNOSIS — R350 Frequency of micturition: Secondary | ICD-10-CM | POA: Diagnosis not present

## 2020-08-22 NOTE — Telephone Encounter (Signed)
Pt will see Amber 08/23/20-first available

## 2020-08-23 ENCOUNTER — Ambulatory Visit (INDEPENDENT_AMBULATORY_CARE_PROVIDER_SITE_OTHER): Payer: Medicare Other | Admitting: Nurse Practitioner

## 2020-08-23 ENCOUNTER — Other Ambulatory Visit: Payer: Self-pay

## 2020-08-23 ENCOUNTER — Encounter: Payer: Self-pay | Admitting: Nurse Practitioner

## 2020-08-23 VITALS — BP 110/50 | HR 103 | Ht 66.0 in | Wt 105.0 lb

## 2020-08-23 DIAGNOSIS — R42 Dizziness and giddiness: Secondary | ICD-10-CM

## 2020-08-23 DIAGNOSIS — R55 Syncope and collapse: Secondary | ICD-10-CM | POA: Diagnosis not present

## 2020-08-23 DIAGNOSIS — Z950 Presence of cardiac pacemaker: Secondary | ICD-10-CM

## 2020-08-23 MED ORDER — FUROSEMIDE 20 MG PO TABS: 20.0000 mg | ORAL_TABLET | Freq: Every day | ORAL | 6 refills | Status: DC

## 2020-08-23 NOTE — Patient Instructions (Addendum)
Medication Instructions:  Your physician has recommended you make the following change in your medication:  -- START Furosemide (Lasix) 20 mg - Take 1 tablet by mouth daily *If you need a refill on your cardiac medications before your next appointment, please call your pharmacy*  Labs Your physician has recommended that you have lab work today: BMET and CBC  Follow-Up: At [facility], you and your health needs are our priority.  As part of our continuing mission to provide you with exceptional heart care, we have created designated Provider Care Teams.  These Care Teams include your primary Cardiologist (physician) and Advanced Practice Providers (APPs -  Physician Assistants and Nurse Practitioners) who all work together to provide you with the care you need, when you need it.  We recommend signing up for the patient portal called "MyChart".  Sign up information is provided on this After Visit Summary.  MyChart is used to connect with patients for Virtual Visits (Telemedicine).  Patients are able to view lab/test results, encounter notes, upcoming appointments, etc.  Non-urgent messages can be sent to your provider as well.   To learn more about what you can do with MyChart, go to [email or web address].    Your next appointment:   Your physician recommends that you schedule a follow-up appointment in: 10-14 days with Dr. Lovena Le.  Remote monitoring is used to monitor your Pacemaker from home. This monitoring reduces the number of office visits required to check your device to one time per year. It allows [place] to keep an eye on the functioning of your device to ensure it is working properly. You are scheduled for a device check from home on 11/19/20. You may send your transmission at any time that day. If you have a wireless device, the transmission will be sent automatically. After your physician reviews your transmission, you will receive a postcard with your next transmission date.   -- A  referral has been placed to Vestibular Rehab for vertigo. Someone from the scheduling department will be in contact with you in regards to coordinating your consultation. If you do not hear from any of the schedulers within 7-10 business days please give our office a call.

## 2020-08-23 NOTE — Progress Notes (Signed)
Electrophysiology Office Note Date: 09/01/2020  ID:  Gabrielle Hoffman, DOB 04/29/1931, MRN [id number]  PCP: Lajean Manes, MD Electrophysiologist: Lovena Le  CC: Pacemaker follow-up/ dry cough   Gabrielle Hoffman is a 87 y.o. female seen today for Dr Lovena Le. She is seen with her son who helps with history.  For the last several days she has had dry non productive cough as well as weight gain. No edema noted in feet or abdomen. They were under the impression they were seeing Dr Lovena Le today.  Unfortunately, he wasn't present in the office at time of office visit.   She denies chest pain, palpitations, dyspnea, PND, orthopnea, nausea, vomiting, dizziness, syncope, edema.    Past Medical History:  Diagnosis Date  . Asthma   . DDD (degenerative disc disease), lumbar   . Dementia    very mild   . Depression   . Disc disorder of cervical region   . Diverticulosis   . Hyperlipidemia   . Hypertension   . Hyponatremia 12/17/2011  . Kidney stones    "passed them"  . Memory loss   . Mitral valve prolapse   . Osteopenia   . Pneumonia    "I believe I had it a long time ago"  . Presence of permanent cardiac pacemaker   . Syncope   . UTI (lower urinary tract infection)   . Vaginal prolapse   . Venous insufficiency    Past Surgical History:  Procedure Laterality Date  . ABDOMINAL HYSTERECTOMY    . APPENDECTOMY    . EP IMPLANTABLE DEVICE N/A 08/27/2015   Procedure: Loop Recorder Insertion;  Surgeon: Evans Lance, MD;  Location: [place];  Service: Cardiovascular;  Laterality: N/A;  . EP IMPLANTABLE DEVICE N/A 11/19/2015   Procedure: Pacemaker Implant;  Surgeon: Evans Lance, MD;  Location: [facility];  Service: Cardiovascular;  Laterality: N/A;  . INSERT / REPLACE / REMOVE PACEMAKER  11/19/2015  . TONSILLECTOMY      Current Outpatient Medications  Medication Sig Dispense Refill  . acetaminophen (TYLENOL) 500 MG tablet Take 2 tablets (1,000 mg total) by mouth 4  (four) times daily. 30 tablet 0  . docusate sodium (COLACE) 100 MG capsule Take 1 capsule (100 mg total) by mouth daily. 10 capsule 0  . dronabinol (MARINOL) 2.5 MG capsule Take 2.5 mg by mouth daily as needed (appetite).    . feeding supplement (ENSURE ENLIVE / ENSURE PLUS) LIQD Take 237 mLs by mouth 3 (three) times daily between meals. 237 mL 12  . furosemide (LASIX) 20 MG tablet Take 1 tablet (20 mg total) by mouth daily. 30 tablet 6  . ibuprofen (ADVIL) 200 MG tablet Take 200 mg by mouth every 6 (six) hours as needed for moderate pain.    . methimazole (TAPAZOLE) 10 MG tablet Take 5 mg by mouth daily.    . ondansetron (ZOFRAN) 8 MG tablet 1 tablet as needed    . pantoprazole (PROTONIX) 40 MG tablet Take 1 tablet (40 mg total) by mouth daily before supper. 30 tablet 0  . polyethylene glycol (MIRALAX / GLYCOLAX) 17 g packet Take 17 g by mouth daily. 14 each 0   No current facility-administered medications for this visit.    Allergies:   Carvedilol, Sulfa antibiotics, and Sulfamethoxazole   Social History: Social History   Socioeconomic History  . Marital status: Widowed    Spouse name: Not on file  . Number of children: Not on file  .  Years of education: Not on file  . Highest education level: Not on file  Occupational History  . Not on file  Tobacco Use  . Smoking status: Never Smoker  . Smokeless tobacco: Never Used  Vaping Use  . Vaping Use: Never used  Substance and Sexual Activity  . Alcohol use: No  . Drug use: No  . Sexual activity: Not Currently    Birth control/protection: Post-menopausal  Other Topics Concern  . Not on file  Social History Narrative  . Not on file   Social Determinants of Health   Financial Resource Strain: Not on file  Food Insecurity: Not on file  Transportation Needs: Not on file  Physical Activity: Not on file  Stress: Not on file  Social Connections: Not on file  Intimate Partner Violence: Not on file    Family History: Family  History  Problem Relation Age of Onset  . Cancer Sister      Review of Systems: All other systems reviewed and are otherwise negative except as noted above.   Physical Exam: VS:  BP (!) 110/50   Pulse (!) 103   Ht 5\' 6"  (1.676 m)   Wt 105 lb (47.6 kg)   SpO2 96%   BMI 16.95 kg/m  , BMI Body mass index is 16.95 kg/m.  GEN- The patient is elderly appearing,pleasant   HEENT: normocephalic, atraumatic; sclera clear, conjunctiva pink; hearing intact; oropharynx clear; neck supple  Lungs- +mild crackles, normal work of breathing.   Heart- Regular rate and rhythm GI- soft, non-tender, non-distended, bowel sounds present  Extremities- no clubbing, cyanosis, or edema  MS- no significant deformity or atrophy Skin- warm and dry, no rash or lesion; PPM pocket well healed Psych- euthymic mood, full affect Neuro- strength and sensation are intact  PPM Interrogation- reviewed in detail today,  See PACEART report  Recent Labs: 06/24/2020: ALT 16 08/23/2020: BUN 14; Creatinine, Ser 0.84; Hemoglobin 10.4; Platelets 375; Potassium 4.9; Sodium 142   Wt Readings from Last 3 Encounters:  08/23/20 105 lb (47.6 kg)  06/30/20 107 lb 9.4 oz (48.8 kg)  06/06/20 105 lb 12.8 oz (48 kg)      Assessment and Plan:  1.  Symptomatic bradycardia Normal PPM function See Pace Art report No changes today  2.  Nonproductive cough/ weight gain Unclear cause With crackles, will try low dose diuretic  3.  Dizziness She has had vertigo in the past that was resolved with vestibular rehab Her son reports recurrent symptoms, will refer     Labs/ tests ordered today include:  Orders Placed This Encounter  Procedures  . Basic Metabolic Panel (BMET)  . CBC w/Diff  . PT vestibular rehab  . EKG 12-Lead     Disposition:   Follow up with PCP or Dr Lovena Le 2 weeks    Signed, Chanetta Marshall, NP 09/01/2020 5:58 PM  [place] [address] [place]  [address] [phone] (office) [phone] (fax)

## 2020-08-24 DIAGNOSIS — G8929 Other chronic pain: Secondary | ICD-10-CM | POA: Diagnosis not present

## 2020-08-24 DIAGNOSIS — I5022 Chronic systolic (congestive) heart failure: Secondary | ICD-10-CM | POA: Diagnosis not present

## 2020-08-24 DIAGNOSIS — W19XXXD Unspecified fall, subsequent encounter: Secondary | ICD-10-CM | POA: Diagnosis not present

## 2020-08-24 DIAGNOSIS — M545 Low back pain, unspecified: Secondary | ICD-10-CM | POA: Diagnosis not present

## 2020-08-24 DIAGNOSIS — I110 Hypertensive heart disease with heart failure: Secondary | ICD-10-CM | POA: Diagnosis not present

## 2020-08-24 DIAGNOSIS — S3282XD Multiple fractures of pelvis without disruption of pelvic ring, subsequent encounter for fracture with routine healing: Secondary | ICD-10-CM | POA: Diagnosis not present

## 2020-08-24 LAB — CBC WITH DIFFERENTIAL/PLATELET
Basophils Absolute: 0.1 10*3/uL (ref 0.0–0.2)
Basos: 1 %
EOS (ABSOLUTE): 0.1 10*3/uL (ref 0.0–0.4)
Eos: 2 %
Hematocrit: 32.7 % — ABNORMAL LOW (ref 34.0–46.6)
Hemoglobin: 10.4 g/dL — ABNORMAL LOW (ref 11.1–15.9)
Immature Grans (Abs): 0 10*3/uL (ref 0.0–0.1)
Immature Granulocytes: 1 %
Lymphocytes Absolute: 1.3 10*3/uL (ref 0.7–3.1)
Lymphs: 19 %
MCH: 30.1 pg (ref 26.6–33.0)
MCHC: 31.8 g/dL (ref 31.5–35.7)
MCV: 95 fL (ref 79–97)
Monocytes Absolute: 1 10*3/uL — ABNORMAL HIGH (ref 0.1–0.9)
Monocytes: 14 %
Neutrophils Absolute: 4.3 10*3/uL (ref 1.4–7.0)
Neutrophils: 63 %
Platelets: 375 10*3/uL (ref 150–450)
RBC: 3.46 x10E6/uL — ABNORMAL LOW (ref 3.77–5.28)
RDW: 14.9 % (ref 11.7–15.4)
WBC: 6.7 10*3/uL (ref 3.4–10.8)

## 2020-08-24 LAB — BASIC METABOLIC PANEL
BUN/Creatinine Ratio: 17 (ref 12–28)
BUN: 14 mg/dL (ref 8–27)
CO2: 26 mmol/L (ref 20–29)
Calcium: 9.1 mg/dL (ref 8.7–10.3)
Chloride: 104 mmol/L (ref 96–106)
Creatinine, Ser: 0.84 mg/dL (ref 0.57–1.00)
GFR calc Af Amer: 72 mL/min/{1.73_m2} (ref >59)
GFR calc non Af Amer: 63 mL/min/{1.73_m2} (ref >59)
Glucose: 88 mg/dL (ref 65–99)
Potassium: 4.9 mmol/L (ref 3.5–5.2)
Sodium: 142 mmol/L (ref 134–144)

## 2020-08-24 NOTE — Progress Notes (Signed)
Remote pacemaker transmission.   

## 2020-08-26 DIAGNOSIS — E785 Hyperlipidemia, unspecified: Secondary | ICD-10-CM | POA: Diagnosis not present

## 2020-08-26 DIAGNOSIS — F419 Anxiety disorder, unspecified: Secondary | ICD-10-CM | POA: Diagnosis not present

## 2020-08-26 DIAGNOSIS — M545 Low back pain, unspecified: Secondary | ICD-10-CM | POA: Diagnosis not present

## 2020-08-26 DIAGNOSIS — M8580 Other specified disorders of bone density and structure, unspecified site: Secondary | ICD-10-CM | POA: Diagnosis not present

## 2020-08-26 DIAGNOSIS — F32A Depression, unspecified: Secondary | ICD-10-CM | POA: Diagnosis not present

## 2020-08-26 DIAGNOSIS — F0390 Unspecified dementia without behavioral disturbance: Secondary | ICD-10-CM | POA: Diagnosis not present

## 2020-08-26 DIAGNOSIS — I5022 Chronic systolic (congestive) heart failure: Secondary | ICD-10-CM | POA: Diagnosis not present

## 2020-08-26 DIAGNOSIS — S3282XD Multiple fractures of pelvis without disruption of pelvic ring, subsequent encounter for fracture with routine healing: Secondary | ICD-10-CM | POA: Diagnosis not present

## 2020-08-26 DIAGNOSIS — I110 Hypertensive heart disease with heart failure: Secondary | ICD-10-CM | POA: Diagnosis not present

## 2020-08-26 DIAGNOSIS — Z950 Presence of cardiac pacemaker: Secondary | ICD-10-CM | POA: Diagnosis not present

## 2020-08-26 DIAGNOSIS — Z9181 History of falling: Secondary | ICD-10-CM | POA: Diagnosis not present

## 2020-08-26 DIAGNOSIS — G8929 Other chronic pain: Secondary | ICD-10-CM | POA: Diagnosis not present

## 2020-08-26 DIAGNOSIS — J45909 Unspecified asthma, uncomplicated: Secondary | ICD-10-CM | POA: Diagnosis not present

## 2020-08-26 DIAGNOSIS — W19XXXD Unspecified fall, subsequent encounter: Secondary | ICD-10-CM | POA: Diagnosis not present

## 2020-08-26 DIAGNOSIS — L89151 Pressure ulcer of sacral region, stage 1: Secondary | ICD-10-CM | POA: Diagnosis not present

## 2020-08-27 DIAGNOSIS — M545 Low back pain, unspecified: Secondary | ICD-10-CM | POA: Diagnosis not present

## 2020-08-27 DIAGNOSIS — I110 Hypertensive heart disease with heart failure: Secondary | ICD-10-CM | POA: Diagnosis not present

## 2020-08-27 DIAGNOSIS — I5022 Chronic systolic (congestive) heart failure: Secondary | ICD-10-CM | POA: Diagnosis not present

## 2020-08-27 DIAGNOSIS — G8929 Other chronic pain: Secondary | ICD-10-CM | POA: Diagnosis not present

## 2020-08-27 DIAGNOSIS — W19XXXD Unspecified fall, subsequent encounter: Secondary | ICD-10-CM | POA: Diagnosis not present

## 2020-08-27 DIAGNOSIS — S3282XD Multiple fractures of pelvis without disruption of pelvic ring, subsequent encounter for fracture with routine healing: Secondary | ICD-10-CM | POA: Diagnosis not present

## 2020-08-28 DIAGNOSIS — I5022 Chronic systolic (congestive) heart failure: Secondary | ICD-10-CM | POA: Diagnosis not present

## 2020-08-28 DIAGNOSIS — G8929 Other chronic pain: Secondary | ICD-10-CM | POA: Diagnosis not present

## 2020-08-28 DIAGNOSIS — K5900 Constipation, unspecified: Secondary | ICD-10-CM | POA: Diagnosis not present

## 2020-08-28 DIAGNOSIS — R42 Dizziness and giddiness: Secondary | ICD-10-CM | POA: Diagnosis not present

## 2020-08-28 DIAGNOSIS — S3282XD Multiple fractures of pelvis without disruption of pelvic ring, subsequent encounter for fracture with routine healing: Secondary | ICD-10-CM | POA: Diagnosis not present

## 2020-08-28 DIAGNOSIS — W19XXXD Unspecified fall, subsequent encounter: Secondary | ICD-10-CM | POA: Diagnosis not present

## 2020-08-28 DIAGNOSIS — R110 Nausea: Secondary | ICD-10-CM | POA: Diagnosis not present

## 2020-08-28 DIAGNOSIS — M545 Low back pain, unspecified: Secondary | ICD-10-CM | POA: Diagnosis not present

## 2020-08-28 DIAGNOSIS — I110 Hypertensive heart disease with heart failure: Secondary | ICD-10-CM | POA: Diagnosis not present

## 2020-08-29 ENCOUNTER — Ambulatory Visit: Payer: Medicare Other | Admitting: Physician Assistant

## 2020-08-30 DIAGNOSIS — H5203 Hypermetropia, bilateral: Secondary | ICD-10-CM | POA: Diagnosis not present

## 2020-08-30 DIAGNOSIS — H401121 Primary open-angle glaucoma, left eye, mild stage: Secondary | ICD-10-CM | POA: Diagnosis not present

## 2020-08-30 DIAGNOSIS — H52223 Regular astigmatism, bilateral: Secondary | ICD-10-CM | POA: Diagnosis not present

## 2020-08-30 DIAGNOSIS — H524 Presbyopia: Secondary | ICD-10-CM | POA: Diagnosis not present

## 2020-08-30 DIAGNOSIS — H401111 Primary open-angle glaucoma, right eye, mild stage: Secondary | ICD-10-CM | POA: Diagnosis not present

## 2020-08-31 DIAGNOSIS — I5022 Chronic systolic (congestive) heart failure: Secondary | ICD-10-CM | POA: Diagnosis not present

## 2020-08-31 DIAGNOSIS — W19XXXD Unspecified fall, subsequent encounter: Secondary | ICD-10-CM | POA: Diagnosis not present

## 2020-08-31 DIAGNOSIS — I110 Hypertensive heart disease with heart failure: Secondary | ICD-10-CM | POA: Diagnosis not present

## 2020-08-31 DIAGNOSIS — G8929 Other chronic pain: Secondary | ICD-10-CM | POA: Diagnosis not present

## 2020-08-31 DIAGNOSIS — M545 Low back pain, unspecified: Secondary | ICD-10-CM | POA: Diagnosis not present

## 2020-08-31 DIAGNOSIS — S3282XD Multiple fractures of pelvis without disruption of pelvic ring, subsequent encounter for fracture with routine healing: Secondary | ICD-10-CM | POA: Diagnosis not present

## 2020-09-03 DIAGNOSIS — W19XXXD Unspecified fall, subsequent encounter: Secondary | ICD-10-CM | POA: Diagnosis not present

## 2020-09-03 DIAGNOSIS — I5022 Chronic systolic (congestive) heart failure: Secondary | ICD-10-CM | POA: Diagnosis not present

## 2020-09-03 DIAGNOSIS — S3282XD Multiple fractures of pelvis without disruption of pelvic ring, subsequent encounter for fracture with routine healing: Secondary | ICD-10-CM | POA: Diagnosis not present

## 2020-09-03 DIAGNOSIS — M545 Low back pain, unspecified: Secondary | ICD-10-CM | POA: Diagnosis not present

## 2020-09-03 DIAGNOSIS — I110 Hypertensive heart disease with heart failure: Secondary | ICD-10-CM | POA: Diagnosis not present

## 2020-09-03 DIAGNOSIS — G8929 Other chronic pain: Secondary | ICD-10-CM | POA: Diagnosis not present

## 2020-09-04 ENCOUNTER — Other Ambulatory Visit: Payer: Self-pay | Admitting: Internal Medicine

## 2020-09-04 DIAGNOSIS — Z8781 Personal history of (healed) traumatic fracture: Secondary | ICD-10-CM | POA: Diagnosis not present

## 2020-09-04 DIAGNOSIS — E0590 Thyrotoxicosis, unspecified without thyrotoxic crisis or storm: Secondary | ICD-10-CM | POA: Diagnosis not present

## 2020-09-04 DIAGNOSIS — M810 Age-related osteoporosis without current pathological fracture: Secondary | ICD-10-CM | POA: Diagnosis not present

## 2020-09-04 DIAGNOSIS — I5022 Chronic systolic (congestive) heart failure: Secondary | ICD-10-CM | POA: Diagnosis not present

## 2020-09-04 DIAGNOSIS — S3282XD Multiple fractures of pelvis without disruption of pelvic ring, subsequent encounter for fracture with routine healing: Secondary | ICD-10-CM | POA: Diagnosis not present

## 2020-09-04 DIAGNOSIS — I110 Hypertensive heart disease with heart failure: Secondary | ICD-10-CM | POA: Diagnosis not present

## 2020-09-04 DIAGNOSIS — M545 Low back pain, unspecified: Secondary | ICD-10-CM | POA: Diagnosis not present

## 2020-09-04 DIAGNOSIS — W19XXXD Unspecified fall, subsequent encounter: Secondary | ICD-10-CM | POA: Diagnosis not present

## 2020-09-04 DIAGNOSIS — E0500 Thyrotoxicosis with diffuse goiter without thyrotoxic crisis or storm: Secondary | ICD-10-CM | POA: Diagnosis not present

## 2020-09-04 DIAGNOSIS — G8929 Other chronic pain: Secondary | ICD-10-CM | POA: Diagnosis not present

## 2020-09-04 DIAGNOSIS — Z8639 Personal history of other endocrine, nutritional and metabolic disease: Secondary | ICD-10-CM | POA: Diagnosis not present

## 2020-09-07 DIAGNOSIS — W19XXXD Unspecified fall, subsequent encounter: Secondary | ICD-10-CM | POA: Diagnosis not present

## 2020-09-07 DIAGNOSIS — M545 Low back pain, unspecified: Secondary | ICD-10-CM | POA: Diagnosis not present

## 2020-09-07 DIAGNOSIS — S3282XD Multiple fractures of pelvis without disruption of pelvic ring, subsequent encounter for fracture with routine healing: Secondary | ICD-10-CM | POA: Diagnosis not present

## 2020-09-07 DIAGNOSIS — I110 Hypertensive heart disease with heart failure: Secondary | ICD-10-CM | POA: Diagnosis not present

## 2020-09-07 DIAGNOSIS — I5022 Chronic systolic (congestive) heart failure: Secondary | ICD-10-CM | POA: Diagnosis not present

## 2020-09-07 DIAGNOSIS — G8929 Other chronic pain: Secondary | ICD-10-CM | POA: Diagnosis not present

## 2020-09-09 NOTE — Progress Notes (Signed)
Cardiology Office Note Date:  09/09/2020  Patient ID:  Shalina, Norfolk 1930-12-20, MRN [id number] PCP:  Lajean Manes, MD  Cardiologist/Electrophysiologist:  Dr. Lovena Le   Chief Complaint:   planned f/u on lasix  History of Present Illness: DAISIE HAFT is a 87 y.o. female with history of syncope and sinus pauses, s/p PPM implant April 2017, HLD, HTN, mild memory loss, dementia, LV dysfunction (EF 45-50%), chronic LBBB   She comes today to be seen for Dr. Lovena Le last seen by him Aug 2021, she was doing well, mention of some dizziness, BP was low and amlodipine was stopped.  More recently saw A. Lynnell Jude, NP 08/23/20, accompanied by her son, c/o dry cough and weight gain, no edema, crackles noted on exam and started on low dose lasix.  Dizziness, was reported by son to have been vertigo improved after vestibular rehab.  TODAY She comes accompanied by her son. She reports she feels OK Denies to  Me any SOB, or coughing (her son reminds me of her dementia) She denies any CP, palpitations or cardiac awareness. No reports of dizziness, near syncope or syncope.  Her son tells me that her cough is perhaps better but continues. He had noted more frequent urination with the addition of lasix. He notes it as a dry, nonproductive cough, most often noted in the evenings after dinner and her night meds. He has also noted no overt SOB but some ties seems to have a stutter to her breath, or a few quick inhales now and then.  He noted that it started about 62mo ago after a hospital stay with a broken hip that apparently was complicated with what sounds like terrible nausea and some degree of bowel obstruction.  She remains with some intermittent constipation issues and occasional nausea. Follows with [facility]  The patient denies any belly pain or tenderness, eating well.  Device information:  SJM PPM implanted 11/19/15, Dr. Lovena Le, SB, CHB, (ILR was removed)   Past Medical History:   Diagnosis Date  . Asthma   . DDD (degenerative disc disease), lumbar   . Dementia    very mild   . Depression   . Disc disorder of cervical region   . Diverticulosis   . Hyperlipidemia   . Hypertension   . Hyponatremia 12/17/2011  . Kidney stones    "passed them"  . Memory loss   . Mitral valve prolapse   . Osteopenia   . Pneumonia    "I believe I had it a long time ago"  . Presence of permanent cardiac pacemaker   . Syncope   . UTI (lower urinary tract infection)   . Vaginal prolapse   . Venous insufficiency     Past Surgical History:  Procedure Laterality Date  . ABDOMINAL HYSTERECTOMY    . APPENDECTOMY    . EP IMPLANTABLE DEVICE N/A 08/27/2015   Procedure: Loop Recorder Insertion;  Surgeon: Evans Lance, MD;  Location: [facility];  Service: Cardiovascular;  Laterality: N/A;  . EP IMPLANTABLE DEVICE N/A 11/19/2015   Procedure: Pacemaker Implant;  Surgeon: Evans Lance, MD;  Location: [facility];  Service: Cardiovascular;  Laterality: N/A;  . INSERT / REPLACE / REMOVE PACEMAKER  11/19/2015  . TONSILLECTOMY      Current Outpatient Medications  Medication Sig Dispense Refill  . acetaminophen (TYLENOL) 500 MG tablet Take 2 tablets (1,000 mg total) by mouth 4 (four) times daily. 30 tablet 0  . docusate  sodium (COLACE) 100 MG capsule Take 1 capsule (100 mg total) by mouth daily. 10 capsule 0  . dronabinol (MARINOL) 2.5 MG capsule Take 2.5 mg by mouth daily as needed (appetite).    . feeding supplement (ENSURE ENLIVE / ENSURE PLUS) LIQD Take 237 mLs by mouth 3 (three) times daily between meals. 237 mL 12  . furosemide (LASIX) 20 MG tablet Take 1 tablet (20 mg total) by mouth daily. 30 tablet 6  . ibuprofen (ADVIL) 200 MG tablet Take 200 mg by mouth every 6 (six) hours as needed for moderate pain.    . methimazole (TAPAZOLE) 10 MG tablet Take 5 mg by mouth daily.    . ondansetron (ZOFRAN) 8 MG tablet 1 tablet as needed    . pantoprazole (PROTONIX) 40 MG  tablet Take 1 tablet (40 mg total) by mouth daily before supper. 30 tablet 0  . polyethylene glycol (MIRALAX / GLYCOLAX) 17 g packet Take 17 g by mouth daily. 14 each 0   No current facility-administered medications for this visit.    Allergies:   Carvedilol, Sulfa antibiotics, and Sulfamethoxazole   Social History:  The patient  reports that she has never smoked. She has never used smokeless tobacco. She reports that she does not drink alcohol and does not use drugs.   Family History:  The patient's family history includes Cancer in her sister.  ROS:  Please see the history of present illness.  All other systems are reviewed and otherwise negative.   PHYSICAL EXAM:  VS:  There were no vitals taken for this visit. BMI: There is no height or weight on file to calculate BMI. Well nourished, well developed, in no acute distress  HEENT: normocephalic, atraumatic  Neck: no JVD, carotid bruits or masses Cardiac:  RRR; no significant murmurs, no rubs, or gallops Lungs:  CTA b/l, no wheezing, rhonchi or rales  Abd: soft, nontender MS: no deformity, age appropriate, perhaps advanced atrophy Ext: no edema  Skin: warm and dry, no rash Neuro:  No gross deficits appreciated Psych: euthymic mood, full affect  PPM site is stable, no tethering or discomfort    EKG:  Not done today  PPM  interrogation today: reviewed by myself: Battery and lead measurements are good 2 AMS and HVR episodes 1:1, no true AF or VT noted  07/26/15: Echocardiogram Study Conclusions - Left ventricle: The cavity size was normal. There was mild focal   basal hypertrophy of the septum. Systolic function was mildly   reduced, primarily due to systolic dyssynchrony. The estimated   ejection fraction was in the range of 45% to 50%. Wall motion was   normal; there were no regional wall motion abnormalities. There   was fusion of early and atrial contributions to ventricular   filling. The study is not technically  sufficient to allow   evaluation of LV diastolic function. - Ventricular septum: Septal motion showed abnormal function,   dyssynergy, and paradox. These changes are consistent with a left   bundle branch block. - Aortic valve: There was trivial regurgitation. - Mitral valve: Calcified annulus.   Recent Labs: 06/24/2020: ALT 16 08/23/2020: BUN 14; Creatinine, Ser 0.84; Hemoglobin 10.4; Platelets 375; Potassium 4.9; Sodium 142  No results found for requested labs within last 8760 hours.   CrCl cannot be calculated (Unknown ideal weight.).   Wt Readings from Last 3 Encounters:  08/23/20 105 lb (47.6 kg)  06/30/20 107 lb 9.4 oz (48.8 kg)  06/06/20 105 lb 12.8 oz (48 kg)  Other studies reviewed: Additional studies/records reviewed today include: summarized above  ASSESSMENT AND PLAN:  1. Intermittent CHB, s/p PPM     Intact function, no programming changes made     cyber security upgrade completed today  2. HTN     stable  3. Mild CM, EF 45-50%     Recently started on lasix      exam does not support volume OL  Cough is nighttime usually an hour or so after dinner,  says she goes to bed fairly shortly after eating. I suspect GERD She is on protonix and has a GI MD I have asked that he elevate the head of her bed, or use a few pillows Avoid laying down with in 2 hours of eating Call her GI doctor to discuss her cough and our discussion today, discussed importance because if she is having severe GERD could be aspirating and this can become a significant problem for her         Disposition: BMEt today given new lasix and see her back in 2-3 mo to follow up.   Current medicines are reviewed at length with the patient today.  The patient did not have any concerns regarding medicines.  Haywood Lasso, PA-C 09/09/2020 7:23 PM     [address] [place] [address] [phone] (office)  [phone] (fax)

## 2020-09-11 ENCOUNTER — Ambulatory Visit: Payer: Medicare Other | Admitting: Physician Assistant

## 2020-09-12 ENCOUNTER — Ambulatory Visit (INDEPENDENT_AMBULATORY_CARE_PROVIDER_SITE_OTHER): Payer: Medicare Other | Admitting: Physician Assistant

## 2020-09-12 ENCOUNTER — Other Ambulatory Visit: Payer: Self-pay

## 2020-09-12 ENCOUNTER — Encounter: Payer: Self-pay | Admitting: Physician Assistant

## 2020-09-12 VITALS — BP 120/60 | HR 94 | Ht 66.0 in | Wt 106.0 lb

## 2020-09-12 DIAGNOSIS — Z79899 Other long term (current) drug therapy: Secondary | ICD-10-CM | POA: Diagnosis not present

## 2020-09-12 DIAGNOSIS — I10 Essential (primary) hypertension: Secondary | ICD-10-CM

## 2020-09-12 DIAGNOSIS — I5022 Chronic systolic (congestive) heart failure: Secondary | ICD-10-CM | POA: Diagnosis not present

## 2020-09-12 DIAGNOSIS — Z950 Presence of cardiac pacemaker: Secondary | ICD-10-CM

## 2020-09-12 DIAGNOSIS — I428 Other cardiomyopathies: Secondary | ICD-10-CM | POA: Diagnosis not present

## 2020-09-12 NOTE — Patient Instructions (Signed)
Medication Instructions:   Your physician recommends that you continue on your current medications as directed. Please refer to the Current Medication list given to you today.   *If you need a refill on your cardiac medications before your next appointment, please call your pharmacy*   Lab Work: BMET TODAY    If you have labs (blood work) drawn today and your tests are completely normal, you will receive your results only by: Marland Kitchen MyChart Message (if you have MyChart) OR . A paper copy in the mail If you have any lab test that is abnormal or we need to change your treatment, we will call you to review the results.   Testing/Procedures: NONE ORDERED  TODAY    Follow-Up: At [facility], you and your health needs are our priority.  As part of our continuing mission to provide you with exceptional heart care, we have created designated Provider Care Teams.  These Care Teams include your primary Cardiologist (physician) and Advanced Practice Providers (APPs -  Physician Assistants and Nurse Practitioners) who all work together to provide you with the care you need, when you need it.  We recommend signing up for the patient portal called "MyChart".  Sign up information is provided on this After Visit Summary.  MyChart is used to connect with patients for Virtual Visits (Telemedicine).  Patients are able to view lab/test results, encounter notes, upcoming appointments, etc.  Non-urgent messages can be sent to your provider as well.   To learn more about what you can do with MyChart, go to [email or web address].    Your next appointment:    2 month(s)  The format for your next appointment:   In Person  Provider:   You may see Dr. Lovena Le  or one of the following Advanced Practice Providers on your designated Care Team:    Tommye Standard, [place]   Other Instructions  1. PLEASE ELEVATE HEAD OF BED WITH TWO THREE PILLOWS AS NEEDED  2. CONTACT GASTROENTEROLOGIST ABOUT ACID REFLUX   3.  DON'T GO TO BED UNTIL AT LEAST AN HOUR OR TWO HAS PASSED AFTER DINNER

## 2020-09-13 LAB — BASIC METABOLIC PANEL WITH GFR
BUN/Creatinine Ratio: 18 (ref 12–28)
BUN: 16 mg/dL (ref 8–27)
CO2: 22 mmol/L (ref 20–29)
Calcium: 9.5 mg/dL (ref 8.7–10.3)
Chloride: 98 mmol/L (ref 96–106)
Creatinine, Ser: 0.9 mg/dL (ref 0.57–1.00)
GFR calc Af Amer: 66 mL/min/1.73
GFR calc non Af Amer: 58 mL/min/1.73 — ABNORMAL LOW
Glucose: 102 mg/dL — ABNORMAL HIGH (ref 65–99)
Potassium: 5.1 mmol/L (ref 3.5–5.2)
Sodium: 137 mmol/L (ref 134–144)

## 2020-09-14 DIAGNOSIS — M545 Low back pain, unspecified: Secondary | ICD-10-CM | POA: Diagnosis not present

## 2020-09-14 DIAGNOSIS — W19XXXD Unspecified fall, subsequent encounter: Secondary | ICD-10-CM | POA: Diagnosis not present

## 2020-09-14 DIAGNOSIS — S3282XD Multiple fractures of pelvis without disruption of pelvic ring, subsequent encounter for fracture with routine healing: Secondary | ICD-10-CM | POA: Diagnosis not present

## 2020-09-14 DIAGNOSIS — I110 Hypertensive heart disease with heart failure: Secondary | ICD-10-CM | POA: Diagnosis not present

## 2020-09-14 DIAGNOSIS — I5022 Chronic systolic (congestive) heart failure: Secondary | ICD-10-CM | POA: Diagnosis not present

## 2020-09-14 DIAGNOSIS — G8929 Other chronic pain: Secondary | ICD-10-CM | POA: Diagnosis not present

## 2020-09-18 ENCOUNTER — Ambulatory Visit: Payer: Medicare Other | Attending: Geriatric Medicine | Admitting: Physical Therapy

## 2020-09-18 ENCOUNTER — Other Ambulatory Visit: Payer: Self-pay

## 2020-09-18 VITALS — BP 114/72 | HR 91

## 2020-09-18 DIAGNOSIS — R42 Dizziness and giddiness: Secondary | ICD-10-CM | POA: Diagnosis not present

## 2020-09-19 NOTE — Therapy (Signed)
[facility] [address] [place], [place], [address] Phone: [phone]   Fax:  [phone]  Physical Therapy Evaluation  Patient Details  Name: Gabrielle Hoffman MRN: [id number] Date of Birth: [birth year] Referring Provider (PT): Dr. Lajean Manes   Encounter Date: 09/18/2020   PT End of Session - 09/19/20 2138    Visit Number 1    Authorization Type Medicare    Authorization Time Period 09-18-20 - 10-16-20    PT Start Time 1545   pt arrived 15" late   PT Stop Time 1625    PT Time Calculation (min) 40 min    Activity Tolerance Patient tolerated treatment well    Behavior During Therapy [facility] for tasks assessed/performed           Past Medical History:  Diagnosis Date  . Asthma   . DDD (degenerative disc disease), lumbar   . Dementia    very mild   . Depression   . Disc disorder of cervical region   . Diverticulosis   . Hyperlipidemia   . Hypertension   . Hyponatremia 12/17/2011  . Kidney stones    "passed them"  . Memory loss   . Mitral valve prolapse   . Osteopenia   . Pneumonia    "I believe I had it a long time ago"  . Presence of permanent cardiac pacemaker   . Syncope   . UTI (lower urinary tract infection)   . Vaginal prolapse   . Venous insufficiency     Past Surgical History:  Procedure Laterality Date  . ABDOMINAL HYSTERECTOMY    . APPENDECTOMY    . EP IMPLANTABLE DEVICE N/A 08/27/2015   Procedure: Loop Recorder Insertion;  Surgeon: Evans Lance, MD;  Location: [facility];  Service: Cardiovascular;  Laterality: N/A;  . EP IMPLANTABLE DEVICE N/A 11/19/2015   Procedure: Pacemaker Implant;  Surgeon: Evans Lance, MD;  Location: [facility];  Service: Cardiovascular;  Laterality: N/A;  . INSERT / REPLACE / REMOVE PACEMAKER  11/19/2015  . TONSILLECTOMY      Vitals:   09/18/20 1607  BP: 114/72  Pulse: 91      Subjective Assessment - 09/18/20 1546    Subjective Pt is  accompanied by her son, [name] - reports dizziness occurred maybe 3 days ago but has not been bad; son states they started to cancel this appt but wants his mother to be evaluated to see if she is having episode of BPPV    Patient Stated Goals per son - to determine if pt is having BPPV episode and treat if present    Currently in Pain? No/denies              [facility] PT Assessment - 09/19/20 0001      Assessment   Medical Diagnosis Light-headedness: Imbalance    Referring Provider (PT) Dr. Lajean Manes    Onset Date/Surgical Date --   approx. Jan. 2022   Prior Therapy Dec. 2020- Jan 2021 at this facility for treatment of Lt BPPV      Precautions   Precautions Fall      Balance Screen   Has the patient fallen in the past 6 months Yes    How many times? 1    Has the patient had a decrease in activity level because of a fear of falling?  No    Is the patient reluctant to leave their home because of a fear of falling?  No      Prior Function   Level of Independence Needs assistance with ADLs;Independent with household mobility without device                  Vestibular Assessment - 09/19/20 0001      Symptom Behavior   Subjective history of current problem pt unable to describe dizziness - information provided by her son    Type of Dizziness  Lightheadedness    Frequency of Dizziness varies    Duration of Dizziness few minutes    Symptom Nature Variable    Relieving Factors Rest    Progression of Symptoms Better    History of similar episodes Nov. 2020      Positional Testing   Dix-Hallpike Dix-Hallpike Right;Dix-Hallpike Left    Sidelying Test Sidelying Right;Sidelying Left      Dix-Hallpike Right   Dix-Hallpike Right Duration none    Dix-Hallpike Right Symptoms No nystagmus      Dix-Hallpike Left   Dix-Hallpike Left Duration none    Dix-Hallpike Left Symptoms No nystagmus      Sidelying Right   Sidelying Right Duration none    Sidelying Right Symptoms No  nystagmus      Sidelying Left   Sidelying Left Duration none    Sidelying Left Symptoms No nystagmus      Positional Sensitivities   Supine to Sitting Lightheadedness    Up from Right Hallpike Lightheadedness    Up from Left Hallpike Lightheadedness              Objective measurements completed on examination: See above findings.                            Plan - 09/19/20 2139    Clinical Impression Statement Pt presents with no signs or symptoms of BPPV at this time (this is what pt's son wanted to rule out); pt did c/o light-headedness with sidelying to sitting from both Rt and Lt sides and also with supine to sit from Dix-Hallpike test positions.  Pt denies any room spinning vertigo and no dizziness was provoked during the eval, other than the light-headedness which subsided after approx. 5 secs. Pt's son reports he is not wanting pt to participate in PT for the imbalance - only wanted pt (his mother) to be evaluated to determine if she was having recurrent episode of BPPV at this time.    Personal Factors and Comorbidities Comorbidity 1;Age    Comorbidities dementia    Examination-Activity Limitations Locomotion Level;Squat;Bend    Examination-Participation Restrictions Cleaning;Shop;Meal Prep    Stability/Clinical Decision Making Stable/Uncomplicated    Clinical Decision Making Low    Rehab Potential Fair    PT Frequency One time visit    Consulted and Agree with Plan of Care Patient;Family member/caregiver    Family Member Consulted son [name]           Patient will benefit from skilled therapeutic intervention in order to improve the following deficits and impairments:  Dizziness,Decreased balance  Visit Diagnosis: Dizziness and giddiness - Plan: PT plan of care cert/re-cert     Problem List Patient Active Problem List   Diagnosis Date Noted  . Lung nodule 06/24/2020  . Pelvic fracture ([place]) 06/24/2020  . Nausea   . Weight loss   .  Fracture of multiple pubic rami, right, closed, initial encounter ([place]) 06/23/2020  . Fall at home, initial encounter 06/23/2020  . Fracture of lumbar  spine ([place]) 06/23/2020  . Pacemaker 04/03/2020  . Intermittent complete heart block ([place]) 11/19/2015  . Chronic systolic heart failure ([place])   . Cognitive change 11/18/2015  . Arrhythmia 11/18/2015  . Syncope and collapse 11/18/2015  . Syncope 07/11/2015  . Low back pain 12/19/2011  . Hyponatremia 12/17/2011  . Dehydration 12/17/2011  . UTI (lower urinary tract infection) 12/17/2011  . HTN (hypertension) 12/17/2011  . Dementia ([place]) 12/17/2011  . 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/12/2011  . Open angle with borderline findings, low risk 05/12/2011  . Dermatochalasis 05/12/2011    DildayJenness Corner, PT 09/19/2020, 9:49 PM  [facility] [address] [place], [place], [address] Phone: [phone]   Fax:  [phone]  Name: Gabrielle Hoffman MRN: [id number] Date of Birth: [birth year]

## 2020-09-21 DIAGNOSIS — W19XXXD Unspecified fall, subsequent encounter: Secondary | ICD-10-CM | POA: Diagnosis not present

## 2020-09-21 DIAGNOSIS — S3282XD Multiple fractures of pelvis without disruption of pelvic ring, subsequent encounter for fracture with routine healing: Secondary | ICD-10-CM | POA: Diagnosis not present

## 2020-09-21 DIAGNOSIS — M545 Low back pain, unspecified: Secondary | ICD-10-CM | POA: Diagnosis not present

## 2020-09-21 DIAGNOSIS — I5022 Chronic systolic (congestive) heart failure: Secondary | ICD-10-CM | POA: Diagnosis not present

## 2020-09-21 DIAGNOSIS — G8929 Other chronic pain: Secondary | ICD-10-CM | POA: Diagnosis not present

## 2020-09-21 DIAGNOSIS — I110 Hypertensive heart disease with heart failure: Secondary | ICD-10-CM | POA: Diagnosis not present

## 2020-10-02 DIAGNOSIS — R300 Dysuria: Secondary | ICD-10-CM | POA: Diagnosis not present

## 2020-10-02 DIAGNOSIS — E0590 Thyrotoxicosis, unspecified without thyrotoxic crisis or storm: Secondary | ICD-10-CM | POA: Diagnosis not present

## 2020-11-09 ENCOUNTER — Ambulatory Visit (INDEPENDENT_AMBULATORY_CARE_PROVIDER_SITE_OTHER): Payer: Medicare Other

## 2020-11-09 DIAGNOSIS — I428 Other cardiomyopathies: Secondary | ICD-10-CM

## 2020-11-09 LAB — CUP PACEART REMOTE DEVICE CHECK
Battery Remaining Longevity: 131 mo
Battery Remaining Percentage: 95.5 %
Battery Voltage: 2.99 V
Brady Statistic AP VP Percent: 1 %
Brady Statistic AP VS Percent: 3.2 %
Brady Statistic AS VP Percent: 1 %
Brady Statistic AS VS Percent: 93 %
Brady Statistic RA Percent Paced: 1 %
Brady Statistic RV Percent Paced: 1 %
Date Time Interrogation Session: 20220408020015
Implantable Lead Implant Date: 20170417
Implantable Lead Implant Date: 20170417
Implantable Lead Location: 753859
Implantable Lead Location: 753860
Implantable Pulse Generator Implant Date: 20170417
Lead Channel Impedance Value: 400 Ohm
Lead Channel Impedance Value: 410 Ohm
Lead Channel Pacing Threshold Amplitude: 0.5 V
Lead Channel Pacing Threshold Amplitude: 0.625 V
Lead Channel Pacing Threshold Pulse Width: 0.5 ms
Lead Channel Pacing Threshold Pulse Width: 0.5 ms
Lead Channel Sensing Intrinsic Amplitude: 0.6 mV
Lead Channel Sensing Intrinsic Amplitude: 10.8 mV
Lead Channel Setting Pacing Amplitude: 0.875
Lead Channel Setting Pacing Amplitude: 1.5 V
Lead Channel Setting Pacing Pulse Width: 0.5 ms
Lead Channel Setting Sensing Sensitivity: 2 mV
Pulse Gen Model: 2272
Pulse Gen Serial Number: 7883162

## 2020-11-19 NOTE — Progress Notes (Signed)
Cardiology Office Note Date:  11/19/2020  Patient ID:  Gabrielle Hoffman, Gabrielle Hoffman 1931/01/09, MRN [id number] PCP:  Lajean Manes, MD  Cardiologist/Electrophysiologist:  Dr. Lovena Le   Chief Complaint:   planned f/u   History of Present Illness: Gabrielle Hoffman is a 87 y.o. female with history of syncope and sinus pauses, s/p PPM implant April 2017, HLD, HTN, mild memory loss, dementia, LV dysfunction (EF 45-50%), chronic LBBB   She comes today to be seen for Dr. Lovena Le last seen by him Aug 2021, she was doing well, mention of some dizziness, BP was low and amlodipine was stopped.  More recently saw A. Lynnell Jude, NP 08/23/20, accompanied by her son, c/o dry cough and weight gain, no edema, crackles noted on exam and started on low dose lasix.  Dizziness, was reported by son to have been vertigo improved after vestibular rehab.  I saw her 09/12/20 She comes accompanied by her son. She reports she feels OK Denies to  Me any SOB, or coughing (her son reminds me of her dementia) She denies any CP, palpitations or cardiac awareness. No reports of dizziness, near syncope or syncope. Her son tells me that her cough is perhaps better but continues. He had noted more frequent urination with the addition of lasix. He notes it as a dry, nonproductive cough, most often noted in the evenings after dinner and her night meds. He has also noted no overt SOB but some ties seems to have a stutter to her breath, or a few quick inhales now and then. He noted that it started about 68mo ago after a hospital stay with a broken hip that apparently was complicated with what sounds like terrible nausea and some degree of bowel obstruction.  She remains with some intermittent constipation issues and occasional nausea. Follows with [facility]  The patient denies any belly pain or tenderness, eating well.  Strong suspicion her cough was GERD and advised raining HOB and not going to bed so soon after dinner, and also to f/u  with her PMD and/or GI MD She did not appear volume OL   TODAY She is again with her son today They are in a bit of a hurry with an appt with Dr. Felipa Eth today as well. The patient denies any particular concerns, denies SOB Her son though add that the nighttime cough after dinner particularly continued Stopped the lasix because it did not seem to help. He tried to have her stay up longer after dinner but is pretty insistent on getting into bed shortly afterwards.  It is an dry irritative sounding cough No SOB, no symptoms of PND or orthopnea are reported They have not seen GI but will see Dr. Felipa Eth today. He mentions today that they walk outside fairly regularly and some days does quite well, and other days she has to stop ever few feet feeling winded. She denies any kind of CP, palpitations or cardiac awareness  No reports of dizziness, near syncope or syncope.  Device information:  SJM PPM implanted 11/19/15, Dr. Lovena Le, SB, CHB, (ILR was removed)   Past Medical History:  Diagnosis Date  . Asthma   . DDD (degenerative disc disease), lumbar   . Dementia    very mild   . Depression   . Disc disorder of cervical region   . Diverticulosis   . Hyperlipidemia   . Hypertension   . Hyponatremia 12/17/2011  . Kidney stones    "passed them"  . Memory loss   .  Mitral valve prolapse   . Osteopenia   . Pneumonia    "I believe I had it a long time ago"  . Presence of permanent cardiac pacemaker   . Syncope   . UTI (lower urinary tract infection)   . Vaginal prolapse   . Venous insufficiency     Past Surgical History:  Procedure Laterality Date  . ABDOMINAL HYSTERECTOMY    . APPENDECTOMY    . EP IMPLANTABLE DEVICE N/A 08/27/2015   Procedure: Loop Recorder Insertion;  Surgeon: Evans Lance, MD;  Location: [facility];  Service: Cardiovascular;  Laterality: N/A;  . EP IMPLANTABLE DEVICE N/A 11/19/2015   Procedure: Pacemaker Implant;  Surgeon: Evans Lance, MD;   Location: [facility];  Service: Cardiovascular;  Laterality: N/A;  . INSERT / REPLACE / REMOVE PACEMAKER  11/19/2015  . TONSILLECTOMY      Current Outpatient Medications  Medication Sig Dispense Refill  . acetaminophen (TYLENOL) 500 MG tablet Take 2 tablets (1,000 mg total) by mouth 4 (four) times daily. 30 tablet 0  . Cholecalciferol (VITAMIN D3) 25 MCG (1000 UT) CAPS Take 1 capsule by mouth daily.    Marland Kitchen docusate sodium (COLACE) 100 MG capsule Take 1 capsule (100 mg total) by mouth daily. 10 capsule 0  . dronabinol (MARINOL) 2.5 MG capsule Take 2.5 mg by mouth daily as needed (appetite).    . feeding supplement (ENSURE ENLIVE / ENSURE PLUS) LIQD Take 237 mLs by mouth 3 (three) times daily between meals. 237 mL 12  . furosemide (LASIX) 20 MG tablet Take 1 tablet (20 mg total) by mouth daily. 30 tablet 6  . ibuprofen (ADVIL) 200 MG tablet Take 200 mg by mouth every 6 (six) hours as needed for moderate pain.    . methimazole (TAPAZOLE) 10 MG tablet Take 5 mg by mouth daily.    . ondansetron (ZOFRAN) 8 MG tablet 1 tablet as needed    . pantoprazole (PROTONIX) 40 MG tablet Take 1 tablet (40 mg total) by mouth daily before supper. 30 tablet 0  . polyethylene glycol (MIRALAX / GLYCOLAX) 17 g packet Take 17 g by mouth daily. 14 each 0   No current facility-administered medications for this visit.    Allergies:   Carvedilol, Sulfa antibiotics, and Sulfamethoxazole   Social History:  The patient  reports that she has never smoked. She has never used smokeless tobacco. She reports that she does not drink alcohol and does not use drugs.   Family History:  The patient's family history includes Cancer in her sister.  ROS:  Please see the history of present illness.  All other systems are reviewed and otherwise negative.   PHYSICAL EXAM:  VS:  There were no vitals taken for this visit. BMI: There is no height or weight on file to calculate BMI. Well nourished, though quite thinwell  developed, in no acute distress  HEENT: normocephalic, atraumatic  Neck: no JVD, carotid bruits or masses Cardiac: RRR; no significant murmurs, no rubs, or gallops Lungs:  CTA b/l, no wheezing, rhonchi or rales  Abd: soft, nontender MS: no deformity, age appropriate, perhaps advanced atrophy Ext: no edema  Skin: warm and dry, no rash Neuro:  No gross deficits appreciated Psych: euthymic mood, full affect  PPM site is stable, no tethering or discomfort    EKG:  Not done today  PPM  interrogation today: reviewed by myself: Battery and lead measurements are OK AMS and HVR episodes noted, all available EGMs  reviewed and are 1:1 AT Longest 19 minutes Burden <1%  07/26/15: Echocardiogram Study Conclusions - Left ventricle: The cavity size was normal. There was mild focal   basal hypertrophy of the septum. Systolic function was mildly   reduced, primarily due to systolic dyssynchrony. The estimated   ejection fraction was in the range of 45% to 50%. Wall motion was   normal; there were no regional wall motion abnormalities. There   was fusion of early and atrial contributions to ventricular   filling. The study is not technically sufficient to allow   evaluation of LV diastolic function. - Ventricular septum: Septal motion showed abnormal function,   dyssynergy, and paradox. These changes are consistent with a left   bundle branch block. - Aortic valve: There was trivial regurgitation. - Mitral valve: Calcified annulus.   Recent Labs: 06/24/2020: ALT 16 08/23/2020: Hemoglobin 10.4; Platelets 375 09/12/2020: BUN 16; Creatinine, Ser 0.90; Potassium 5.1; Sodium 137  No results found for requested labs within last 8760 hours.   CrCl cannot be calculated (Patient's most recent lab result is older than the maximum 21 days allowed.).   Wt Readings from Last 3 Encounters:  09/12/20 106 lb (48.1 kg)  08/23/20 105 lb (47.6 kg)  06/30/20 107 lb 9.4 oz (48.8 kg)     Other studies  reviewed: Additional studies/records reviewed today include: summarized above  ASSESSMENT AND PLAN:  1. Intermittent CHB, s/p PPM     Intact function, no programming changes made     AP <1%     VP <1%  2. HTN     Higher then her last visit     No changes  3. Mild CM, EF 45-50% by her echo in 2016     Recently started on lasix      exam does not support volume OL  Her son is quite worried about her intermittent SOB with her walks and while agrees perhaps her nighttime cough may be GERD would feel best with some cardiac investigation Will send her for a CXR and echo They will discuss with Dr. Felipa Eth today as well         Disposition: remotes as usual,. 45mo in clinic with EP, sooner if needed, pending test results   Current medicines are reviewed at length with the patient today.  The patient did not have any concerns regarding medicines.  Gabrielle Lasso, PA-C 11/19/2020 10:28 AM     [address] [place]  [place] [address] [phone] (office)  [phone] (fax)

## 2020-11-20 ENCOUNTER — Encounter (INDEPENDENT_AMBULATORY_CARE_PROVIDER_SITE_OTHER): Payer: Self-pay

## 2020-11-20 ENCOUNTER — Encounter: Payer: Self-pay | Admitting: Physician Assistant

## 2020-11-20 ENCOUNTER — Other Ambulatory Visit: Payer: Self-pay

## 2020-11-20 ENCOUNTER — Ambulatory Visit (INDEPENDENT_AMBULATORY_CARE_PROVIDER_SITE_OTHER): Payer: Medicare Other | Admitting: Physician Assistant

## 2020-11-20 VITALS — BP 150/70 | HR 93 | Ht 66.0 in | Wt 110.0 lb

## 2020-11-20 DIAGNOSIS — H903 Sensorineural hearing loss, bilateral: Secondary | ICD-10-CM | POA: Diagnosis not present

## 2020-11-20 DIAGNOSIS — R0600 Dyspnea, unspecified: Secondary | ICD-10-CM

## 2020-11-20 DIAGNOSIS — G301 Alzheimer's disease with late onset: Secondary | ICD-10-CM | POA: Diagnosis not present

## 2020-11-20 DIAGNOSIS — Z950 Presence of cardiac pacemaker: Secondary | ICD-10-CM

## 2020-11-20 DIAGNOSIS — I10 Essential (primary) hypertension: Secondary | ICD-10-CM | POA: Diagnosis not present

## 2020-11-20 DIAGNOSIS — I459 Conduction disorder, unspecified: Secondary | ICD-10-CM | POA: Diagnosis not present

## 2020-11-20 DIAGNOSIS — I5022 Chronic systolic (congestive) heart failure: Secondary | ICD-10-CM | POA: Diagnosis not present

## 2020-11-20 DIAGNOSIS — E0590 Thyrotoxicosis, unspecified without thyrotoxic crisis or storm: Secondary | ICD-10-CM | POA: Diagnosis not present

## 2020-11-20 DIAGNOSIS — F0280 Dementia in other diseases classified elsewhere without behavioral disturbance: Secondary | ICD-10-CM | POA: Diagnosis not present

## 2020-11-20 DIAGNOSIS — Z1389 Encounter for screening for other disorder: Secondary | ICD-10-CM | POA: Diagnosis not present

## 2020-11-20 DIAGNOSIS — K219 Gastro-esophageal reflux disease without esophagitis: Secondary | ICD-10-CM | POA: Diagnosis not present

## 2020-11-20 DIAGNOSIS — R5383 Other fatigue: Secondary | ICD-10-CM | POA: Diagnosis not present

## 2020-11-20 DIAGNOSIS — Z0000 Encounter for general adult medical examination without abnormal findings: Secondary | ICD-10-CM | POA: Diagnosis not present

## 2020-11-20 DIAGNOSIS — R8290 Unspecified abnormal findings in urine: Secondary | ICD-10-CM | POA: Diagnosis not present

## 2020-11-20 LAB — CUP PACEART INCLINIC DEVICE CHECK
Battery Remaining Longevity: 136 mo
Battery Voltage: 2.99 V
Brady Statistic RA Percent Paced: 0.13 %
Brady Statistic RV Percent Paced: 0.17 %
Date Time Interrogation Session: 20220419163658
Implantable Lead Implant Date: 20170417
Implantable Lead Implant Date: 20170417
Implantable Lead Location: 753859
Implantable Lead Location: 753860
Implantable Pulse Generator Implant Date: 20170417
Lead Channel Impedance Value: 412.5 Ohm
Lead Channel Impedance Value: 437.5 Ohm
Lead Channel Pacing Threshold Amplitude: 0.625 V
Lead Channel Pacing Threshold Amplitude: 0.875 V
Lead Channel Pacing Threshold Pulse Width: 0.5 ms
Lead Channel Pacing Threshold Pulse Width: 0.5 ms
Lead Channel Sensing Intrinsic Amplitude: 1.8 mV
Lead Channel Sensing Intrinsic Amplitude: 12 mV
Lead Channel Setting Pacing Amplitude: 0.875
Lead Channel Setting Pacing Amplitude: 1.875
Lead Channel Setting Pacing Pulse Width: 0.5 ms
Lead Channel Setting Sensing Sensitivity: 2 mV
Pulse Gen Model: 2272
Pulse Gen Serial Number: 7883162

## 2020-11-20 NOTE — Patient Instructions (Addendum)
Medication Instructions:    Your physician recommends that you continue on your current medications as directed. Please refer to the Current Medication list given to you today.   *If you need a refill on your cardiac medications before your next appointment, please call your pharmacy*   Lab Work: [place]   If you have labs (blood work) drawn today and your tests are completely normal, you will receive your results only by: Marland Kitchen MyChart Message (if you have MyChart) OR . A paper copy in the mail If you have any lab test that is abnormal or we need to change your treatment, we will call you to review the results.   Testing/Procedures: Your physician has requested that you have an echocardiogram. Echocardiography is a painless test that uses sound waves to create images of your heart. It provides your doctor with information about the size and shape of your heart and how well your heart's chambers and valves are working. This procedure takes approximately one hour. There are no restrictions for this procedure.    A chest x-ray takes a picture of the organs and structures inside the chest, including the heart, lungs, and blood vessels. This test can show several things, including, whether the heart is enlarges; whether fluid is building up in the lungs; and whether pacemaker / defibrillator leads are still in place.  Located in: [facility] Address: [address], [place], [place] [address] Areas served: [place] and nearby areas Hours:  Tuesday 8AM-5:30PM Wednesday 8AM-5:30PM Thursday 8AM-5:30PM Friday 8AM-5:30PM Saturday Closed Sunday Closed Monday 8AM-5:30PM Phone: [phone]   Follow-Up: At [facility], you and your health needs are our priority.  As part of our continuing mission to provide you with exceptional heart care, we have created designated Provider Care Teams.  These Care Teams include your primary Cardiologist (physician) and  Advanced Practice Providers (APPs -  Physician Assistants and Nurse Practitioners) who all work together to provide you with the care you need, when you need it.  We recommend signing up for the patient portal called "MyChart".  Sign up information is provided on this After Visit Summary.  MyChart is used to connect with patients for Virtual Visits (Telemedicine).  Patients are able to view lab/test results, encounter notes, upcoming appointments, etc.  Non-urgent messages can be sent to your provider as well.   To learn more about what you can do with MyChart, go to [email or web address].    Your next appointment:   6 month(s)  The format for your next appointment:   In Person  Provider:   You may see Dr. Lovena Le  or one of the following Advanced Practice Providers on your designated Care Team:    Chanetta Marshall, NP  Tommye Standard, PA-C  Legrand Como "Oda Kilts, [place]    Other Instructions

## 2020-11-23 NOTE — Progress Notes (Signed)
Remote pacemaker transmission.   

## 2020-12-04 ENCOUNTER — Ambulatory Visit
Admission: RE | Admit: 2020-12-04 | Discharge: 2020-12-04 | Disposition: A | Discharge status: Home / Self Care | Payer: Medicare Other | Source: Ambulatory Visit | Attending: Physician Assistant | Admitting: Physician Assistant

## 2020-12-04 DIAGNOSIS — R059 Cough, unspecified: Secondary | ICD-10-CM | POA: Diagnosis not present

## 2020-12-04 DIAGNOSIS — E0590 Thyrotoxicosis, unspecified without thyrotoxic crisis or storm: Secondary | ICD-10-CM | POA: Diagnosis not present

## 2020-12-04 DIAGNOSIS — R0602 Shortness of breath: Secondary | ICD-10-CM | POA: Diagnosis not present

## 2020-12-04 DIAGNOSIS — R0600 Dyspnea, unspecified: Secondary | ICD-10-CM

## 2020-12-04 DIAGNOSIS — D649 Anemia, unspecified: Secondary | ICD-10-CM | POA: Diagnosis not present

## 2020-12-04 DIAGNOSIS — E0500 Thyrotoxicosis with diffuse goiter without thyrotoxic crisis or storm: Secondary | ICD-10-CM | POA: Diagnosis not present

## 2020-12-26 ENCOUNTER — Ambulatory Visit (HOSPITAL_COMMUNITY): Payer: Medicare Other | Attending: Physician Assistant

## 2020-12-26 ENCOUNTER — Other Ambulatory Visit: Payer: Self-pay

## 2020-12-26 DIAGNOSIS — R0600 Dyspnea, unspecified: Secondary | ICD-10-CM | POA: Diagnosis not present

## 2020-12-26 LAB — ECHOCARDIOGRAM COMPLETE
Area-P 1/2: 4.27 cm2
S' Lateral: 3.3 cm

## 2020-12-28 ENCOUNTER — Telehealth: Payer: Self-pay | Admitting: *Deleted

## 2020-12-28 NOTE — Telephone Encounter (Signed)
Patient was returning call 

## 2020-12-28 NOTE — Telephone Encounter (Signed)
Lvm on son (DPR) vm to contact office back based upon patient results

## 2021-01-09 ENCOUNTER — Encounter: Payer: Self-pay | Admitting: Student

## 2021-01-09 ENCOUNTER — Other Ambulatory Visit: Payer: Self-pay

## 2021-01-09 ENCOUNTER — Ambulatory Visit (INDEPENDENT_AMBULATORY_CARE_PROVIDER_SITE_OTHER): Payer: Medicare Other | Admitting: Student

## 2021-01-09 VITALS — BP 140/66 | HR 90 | Ht 66.0 in | Wt 107.0 lb

## 2021-01-09 DIAGNOSIS — I442 Atrioventricular block, complete: Secondary | ICD-10-CM | POA: Diagnosis not present

## 2021-01-09 DIAGNOSIS — I428 Other cardiomyopathies: Secondary | ICD-10-CM | POA: Diagnosis not present

## 2021-01-09 DIAGNOSIS — I10 Essential (primary) hypertension: Secondary | ICD-10-CM

## 2021-01-09 DIAGNOSIS — I5022 Chronic systolic (congestive) heart failure: Secondary | ICD-10-CM | POA: Diagnosis not present

## 2021-01-09 DIAGNOSIS — R300 Dysuria: Secondary | ICD-10-CM | POA: Diagnosis not present

## 2021-01-09 DIAGNOSIS — R0600 Dyspnea, unspecified: Secondary | ICD-10-CM

## 2021-01-09 MED ORDER — BISOPROLOL FUMARATE 5 MG PO TABS: 2.5000 mg | ORAL_TABLET | Freq: Every day | ORAL | 3 refills | Status: DC

## 2021-01-09 NOTE — Progress Notes (Signed)
Electrophysiology Office Note Date: 01/09/2021  ID:  MAGGI HERSHKOWITZ, DOB Jan 12, 1931, MRN [id number]  PCP: Lajean Manes, MD Primary Cardiologist: None Electrophysiologist: Cristopher Peru, MD   CC: Pacemaker follow-up  Gabrielle Hoffman is a 87 y.o. female seen today for Cristopher Peru, MD for routine electrophysiology followup and to review Echo results.    Previously seen by A. Lynnell Jude, NP 08/23/20, accompanied by her son, c/o dry cough and weight gain, no edema, crackles noted on exam and started on low dose lasix.  Dizziness, was reported by son to have been vertigo improved after vestibular rehab.  Seen by Augustin Schooling 09/12/20 She comes accompanied by her son. She reports she feels OK Denies to  Me any SOB, or coughing (her son reminds me of her dementia) She denies any CP, palpitations or cardiac awareness. No reports of dizziness, near syncope or syncope. Her son tells me that her cough is perhaps better but continues. He had noted more frequent urination with the addition of lasix. He notes it as a dry, nonproductive cough, most often noted in the evenings after dinner and her night meds. He has also noted no overt SOB but some ties seems to have a stutter to her breath, or a few quick inhales now and then. He noted that it started about 3mo ago after a hospital stay with a broken hip that apparently was complicated with what sounds like terrible nausea and some degree of bowel obstruction.  She remains with some intermittent constipation issues and occasional nausea. Follows with [facility]  The patient denies any belly pain or tenderness, eating well.  Strong suspicion her cough was GERD and advised raining HOB and not going to bed so soon after dinner, and also to f/u with her PMD and/or GI MD She did not appear volume OL   Seen by Augustin Schooling 11/20/20 She is again with her son today They are in a bit of a hurry with an appt with Dr. Felipa Eth today as well. The patient denies  any particular concerns, denies SOB Her son though add that the nighttime cough after dinner particularly continued Stopped the lasix because it did not seem to help. He tried to have her stay up longer after dinner but is pretty insistent on getting into bed shortly afterwards.  It is an dry irritative sounding cough No SOB, no symptoms of PND or orthopnea are reported They have not seen GI but will see Dr. Felipa Eth today. He mentions today that they walk outside fairly regularly and some days does quite well, and other days she has to stop ever few feet feeling winded. She denies any kind of CP, palpitations or cardiac awareness  Echo 12/26/2020 LVEF 25-30%, Grade 1 DD, Mod LAE  Since last being seen in our clinic the patient reports doing about the same. The son disagrees with her when she states she hasn't had any SOB. He states she seems winded at times walking around the neighborhood, where around a year or so ago she could do it without difficulty. She continues to have cough, specifically after dinner with lying down in bed. She said "my chest is bothering me" the other day when clearing brush in the yard with her son. She has not previously or since complained of any chest discomfort. She has also more of a cough throughout the day and nasal congestion since last visit. They are awaiting a cough syrup prescriptions. she denies palpitations, dyspnea, PND, orthopnea, nausea, vomiting, dizziness, syncope,  edema, weight gain, or early satiety.  Device History: SJM PPM implanted 11/19/15, Dr. Lovena Le, SB, CHB, (ILR was removed)  Past Medical History:  Diagnosis Date  . Asthma   . DDD (degenerative disc disease), lumbar   . Dementia    very mild   . Depression   . Disc disorder of cervical region   . Diverticulosis   . Hyperlipidemia   . Hypertension   . Hyponatremia 12/17/2011  . Kidney stones    "passed them"  . Memory loss   . Mitral valve prolapse   . Osteopenia   . Pneumonia     "I believe I had it a long time ago"  . Presence of permanent cardiac pacemaker   . Syncope   . UTI (lower urinary tract infection)   . Vaginal prolapse   . Venous insufficiency    Past Surgical History:  Procedure Laterality Date  . ABDOMINAL HYSTERECTOMY    . APPENDECTOMY    . EP IMPLANTABLE DEVICE N/A 08/27/2015   Procedure: Loop Recorder Insertion;  Surgeon: Evans Lance, MD;  Location: [facility];  Service: Cardiovascular;  Laterality: N/A;  . EP IMPLANTABLE DEVICE N/A 11/19/2015   Procedure: Pacemaker Implant;  Surgeon: Evans Lance, MD;  Location: [facility];  Service: Cardiovascular;  Laterality: N/A;  . INSERT / REPLACE / REMOVE PACEMAKER  11/19/2015  . TONSILLECTOMY      Current Outpatient Medications  Medication Sig Dispense Refill  . acetaminophen (TYLENOL) 500 MG tablet Take 2 tablets (1,000 mg total) by mouth 4 (four) times daily. 30 tablet 0  . bisoprolol (ZEBETA) 5 MG tablet Take 0.5 tablets (2.5 mg total) by mouth at bedtime. 45 tablet 3  . Cholecalciferol (VITAMIN D3) 25 MCG (1000 UT) CAPS Take 1 capsule by mouth daily.    Marland Kitchen dronabinol (MARINOL) 2.5 MG capsule Take 2.5 mg by mouth daily as needed (appetite).    . feeding supplement (ENSURE ENLIVE / ENSURE PLUS) LIQD Take 237 mLs by mouth 3 (three) times daily between meals. 237 mL 12  . ibuprofen (ADVIL) 200 MG tablet Take 200 mg by mouth every 6 (six) hours as needed for moderate pain.    . methimazole (TAPAZOLE) 10 MG tablet Take 5 mg by mouth daily.    . ondansetron (ZOFRAN) 8 MG tablet 1 tablet as needed    . pantoprazole (PROTONIX) 40 MG tablet Take 1 tablet (40 mg total) by mouth daily before supper. 30 tablet 0   No current facility-administered medications for this visit.    Allergies:   Carvedilol, Sulfa antibiotics, and Sulfamethoxazole   Social History: Social History   Socioeconomic History  . Marital status: Widowed    Spouse name: Not on file  . Number of children: Not on file   . Years of education: Not on file  . Highest education level: Not on file  Occupational History  . Not on file  Tobacco Use  . Smoking status: Never Smoker  . Smokeless tobacco: Never Used  Vaping Use  . Vaping Use: Never used  Substance and Sexual Activity  . Alcohol use: No  . Drug use: No  . Sexual activity: Not Currently    Birth control/protection: Post-menopausal  Other Topics Concern  . Not on file  Social History Narrative  . Not on file   Social Determinants of Health   Financial Resource Strain: Not on file  Food Insecurity: Not on file  Transportation Needs: Not on file  Physical  Activity: Not on file  Stress: Not on file  Social Connections: Not on file  Intimate Partner Violence: Not on file    Family History: Family History  Problem Relation Age of Onset  . Cancer Sister     Review of Systems: All other systems reviewed and are otherwise negative except as noted above.  Physical Exam: Vitals:   01/09/21 1221  BP: 140/66  Pulse: 90  SpO2: 97%  Weight: 107 lb (48.5 kg)  Height: 5\' 6"  (1.676 m)     GEN- The patient is well appearing, alert and oriented x 3 today.   HEENT: normocephalic, atraumatic; sclera clear, conjunctiva pink; hearing intact; oropharynx clear; neck supple  Lungs- Clear to ausculation bilaterally, normal work of breathing.  No wheezes, rales, rhonchi Heart- Regular rate and rhythm, no murmurs, rubs or gallops  GI- soft, non-tender, non-distended, bowel sounds present  Extremities- no clubbing or cyanosis. No edema MS- no significant deformity or atrophy Skin- warm and dry, no rash or lesion; PPM pocket well healed Psych- euthymic mood, full affect Neuro- strength and sensation are intact  PPM Interrogation- Not checked today.   EKG:  EKG is not ordered today.  Recent Labs: 06/24/2020: ALT 16 08/23/2020: Hemoglobin 10.4; Platelets 375 09/12/2020: BUN 16; Creatinine, Ser 0.90; Potassium 5.1; Sodium 137   Wt Readings from  Last 3 Encounters:  01/09/21 107 lb (48.5 kg)  11/20/20 110 lb (49.9 kg)  09/12/20 106 lb (48.1 kg)     Other studies Reviewed: Additional studies/ records that were reviewed today include: Previous EP office notes, Previous remote checks, Most recent labwork.   Assessment and Plan:  1. Intermittent CHB s/p St. Jude PPM  Normal PPM function Not checked today. See report from last visit  2. Systolic CHF  Echo 7/35/3299 with LVEF 25-30%. This is decreased from 45-50% in 2016 She has low pacing percentages so not pacer-mediated QRS ~120 and VP <1%. She would not benefit from CRT. Will cautiously add bisoprolol 2.5 mg daily. If increases RV pacing risk may outweight benefit.  Labs today. I think she would benefit the most from spironolactone, but K has run borderline high in the past.  If K and Cr stable, consider low dose spiro vs losartan at next visit.  Could consider very low dose spiro as low as 12.5 mg every OTHER day.  I discussed with her son at length that I suspect her mild fatigue and deconditioning over the past year is multifactorial, and while we will gently attempt her on GDMT, I cannot guarantee any improvement in her symptoms, and if we drop her BP, we may be limited from that perspective as well. He verbalizes understanding, but has high hopes for improvement based on our conversation.  I think the odds of a new ischemic cardiomyopathy in this age with previously normal EF is unlikely. If she were to have ischemic symptoms could consider myoview. I think this would be unlikely to change her clinical course at this point.  I will discuss further with Dr. Lovena Le at the next opportunity.   3. HTN Adding low dose bisoprolol as above.   Current medicines are reviewed at length with the patient today.   The patient does not have concerns regarding her medicines.  The following changes were made today:  Low dose bisoprolol added.  Labs/ tests ordered today include:  Orders  Placed This Encounter  Procedures  . CBC  . Basic metabolic panel   Disposition:   Follow up with  EP APP in 2 Weeks   Gerrianne Scale  01/09/2021 1:30 PM  [place]  [phone] (office) [phone] (fax)

## 2021-01-09 NOTE — Patient Instructions (Signed)
Medication Instructions:  Your physician has recommended you make the following change in your medication:   START: Bisoprolol 2.5mg  daily at bedtime  *If you need a refill on your cardiac medications before your next appointment, please call your pharmacy*   Lab Work: TODAY: BMET, CBC  If you have labs (blood work) drawn today and your tests are completely normal, you will receive your results only by: Marland Kitchen MyChart Message (if you have MyChart) OR . A paper copy in the mail If you have any lab test that is abnormal or we need to change your treatment, we will call you to review the results.   Follow-Up: At [facility], you and your health needs are our priority.  As part of our continuing mission to provide you with exceptional heart care, we have created designated Provider Care Teams.  These Care Teams include your primary Cardiologist (physician) and Advanced Practice Providers (APPs -  Physician Assistants and Nurse Practitioners) who all work together to provide you with the care you need, when you need it.  We recommend signing up for the patient portal called "MyChart".  Sign up information is provided on this After Visit Summary.  MyChart is used to connect with patients for Virtual Visits (Telemedicine).  Patients are able to view lab/test results, encounter notes, upcoming appointments, etc.  Non-urgent messages can be sent to your provider as well.   To learn more about what you can do with MyChart, go to [email or web address].    Your next appointment:   As scheduled

## 2021-01-10 ENCOUNTER — Telehealth: Payer: Self-pay | Admitting: Student

## 2021-01-10 LAB — CBC
Hematocrit: 35.9 % (ref 34.0–46.6)
Hemoglobin: 11.8 g/dL (ref 11.1–15.9)
MCH: 28.6 pg (ref 26.6–33.0)
MCHC: 32.9 g/dL (ref 31.5–35.7)
MCV: 87 fL (ref 79–97)
Platelets: 300 10*3/uL (ref 150–450)
RBC: 4.13 x10E6/uL (ref 3.77–5.28)
RDW: 14.4 % (ref 11.7–15.4)
WBC: 4.7 10*3/uL (ref 3.4–10.8)

## 2021-01-10 LAB — BASIC METABOLIC PANEL
BUN/Creatinine Ratio: 12 (ref 12–28)
BUN: 11 mg/dL (ref 8–27)
CO2: 21 mmol/L (ref 20–29)
Calcium: 9.4 mg/dL (ref 8.7–10.3)
Chloride: 99 mmol/L (ref 96–106)
Creatinine, Ser: 0.91 mg/dL (ref 0.57–1.00)
Glucose: 100 mg/dL — ABNORMAL HIGH (ref 65–99)
Potassium: 5.4 mmol/L — ABNORMAL HIGH (ref 3.5–5.2)
Sodium: 137 mmol/L (ref 134–144)
eGFR: 61 mL/min/{1.73_m2} (ref >59)

## 2021-01-10 NOTE — Telephone Encounter (Signed)
Pt son called in and stated he was calling April back about pt lab results.  He stated he would like a copy sent to address on file

## 2021-01-15 ENCOUNTER — Other Ambulatory Visit: Payer: Self-pay | Admitting: Geriatric Medicine

## 2021-01-15 ENCOUNTER — Ambulatory Visit
Admission: RE | Admit: 2021-01-15 | Discharge: 2021-01-15 | Disposition: A | Discharge status: Home / Self Care | Payer: Medicare Other | Source: Ambulatory Visit | Attending: Geriatric Medicine | Admitting: Geriatric Medicine

## 2021-01-15 DIAGNOSIS — R059 Cough, unspecified: Secondary | ICD-10-CM

## 2021-01-15 DIAGNOSIS — G301 Alzheimer's disease with late onset: Secondary | ICD-10-CM | POA: Diagnosis not present

## 2021-01-15 DIAGNOSIS — F0280 Dementia in other diseases classified elsewhere without behavioral disturbance: Secondary | ICD-10-CM | POA: Diagnosis not present

## 2021-01-15 DIAGNOSIS — J4540 Moderate persistent asthma, uncomplicated: Secondary | ICD-10-CM | POA: Diagnosis not present

## 2021-01-15 DIAGNOSIS — I10 Essential (primary) hypertension: Secondary | ICD-10-CM | POA: Diagnosis not present

## 2021-01-17 NOTE — Telephone Encounter (Signed)
Lab work mailed to pt

## 2021-01-22 DIAGNOSIS — J4540 Moderate persistent asthma, uncomplicated: Secondary | ICD-10-CM | POA: Diagnosis not present

## 2021-01-22 DIAGNOSIS — I10 Essential (primary) hypertension: Secondary | ICD-10-CM | POA: Diagnosis not present

## 2021-01-22 DIAGNOSIS — G301 Alzheimer's disease with late onset: Secondary | ICD-10-CM | POA: Diagnosis not present

## 2021-01-23 ENCOUNTER — Ambulatory Visit (INDEPENDENT_AMBULATORY_CARE_PROVIDER_SITE_OTHER): Payer: Medicare Other | Admitting: Student

## 2021-01-23 ENCOUNTER — Telehealth: Payer: Self-pay | Admitting: Internal Medicine

## 2021-01-23 ENCOUNTER — Other Ambulatory Visit: Payer: Self-pay

## 2021-01-23 VITALS — BP 112/56 | HR 71 | Resp 14 | Wt 102.2 lb

## 2021-01-23 DIAGNOSIS — R0600 Dyspnea, unspecified: Secondary | ICD-10-CM | POA: Diagnosis not present

## 2021-01-23 DIAGNOSIS — I428 Other cardiomyopathies: Secondary | ICD-10-CM

## 2021-01-23 DIAGNOSIS — I5022 Chronic systolic (congestive) heart failure: Secondary | ICD-10-CM

## 2021-01-23 DIAGNOSIS — Z79899 Other long term (current) drug therapy: Secondary | ICD-10-CM

## 2021-01-23 LAB — CUP PACEART INCLINIC DEVICE CHECK
Battery Remaining Longevity: 138 mo
Battery Voltage: 2.99 V
Brady Statistic RA Percent Paced: 2.1 %
Brady Statistic RV Percent Paced: 0.29 %
Date Time Interrogation Session: 20220622141315
Implantable Lead Implant Date: 20170417
Implantable Lead Implant Date: 20170417
Implantable Lead Location: 753859
Implantable Lead Location: 753860
Implantable Pulse Generator Implant Date: 20170417
Lead Channel Impedance Value: 425 Ohm
Lead Channel Impedance Value: 450 Ohm
Lead Channel Pacing Threshold Amplitude: 0.5 V
Lead Channel Pacing Threshold Amplitude: 0.625 V
Lead Channel Pacing Threshold Pulse Width: 0.5 ms
Lead Channel Pacing Threshold Pulse Width: 0.5 ms
Lead Channel Sensing Intrinsic Amplitude: 0.9 mV
Lead Channel Sensing Intrinsic Amplitude: 12 mV
Lead Channel Setting Pacing Amplitude: 0.875
Lead Channel Setting Pacing Amplitude: 1.5 V
Lead Channel Setting Pacing Pulse Width: 0.5 ms
Lead Channel Setting Sensing Sensitivity: 2 mV
Pulse Gen Model: 2272
Pulse Gen Serial Number: 7883162

## 2021-01-23 NOTE — Progress Notes (Signed)
Electrophysiology Office Note Date: 01/23/2021  ID:  Gabrielle, Hoffman 16-May-1931, MRN [id number]  PCP: Gabrielle Manes, MD Primary Cardiologist: None Electrophysiologist: Gabrielle Peru, MD   CC: Pacemaker follow-up  Gabrielle Hoffman is a 87 y.o. female seen today for Gabrielle Peru, MD for routine electrophysiology followup.  Since last being seen in our clinic the patient reports doing about the same. She has been diagnosed with Asthma. "Moderate persistent reactive airway disease" per description of his note. Her son states he is worried about her overall functional status. She has decreasing appetite. She is able to do her ADLs with minimal re-direction per her son. He is distressed that I have not yet spoken with his cousin regarding his mothers condition. Pt herself is quiet during the visit. She says "I'm doing pretty well for a young lady". As last visit, she denies overt SOB but her son re-directs.   Device History: SJM PPM implanted 11/19/15, Dr. Lovena Hoffman, SB, [facility], (ILR was removed)  Past Medical History:  Diagnosis Date   Asthma    DDD (degenerative disc disease), lumbar    Dementia    very mild    Depression    Disc disorder of cervical region    Diverticulosis    Hyperlipidemia    Hypertension    Hyponatremia 12/17/2011   Kidney stones    "passed them"   Memory loss    Mitral valve prolapse    Osteopenia    Pneumonia    "I believe I had it a long time ago"   Presence of permanent cardiac pacemaker    Syncope    UTI (lower urinary tract infection)    Vaginal prolapse    Venous insufficiency    Past Surgical History:  Procedure Laterality Date   ABDOMINAL HYSTERECTOMY     APPENDECTOMY     EP IMPLANTABLE DEVICE N/A 08/27/2015   Procedure: Loop Recorder Insertion;  Surgeon: Gabrielle Lance, MD;  Location: [facility];  Service: Cardiovascular;  Laterality: N/A;   EP IMPLANTABLE DEVICE N/A 11/19/2015   Procedure: Pacemaker Implant;  Surgeon: Gabrielle Lance,  MD;  Location: [facility];  Service: Cardiovascular;  Laterality: N/A;   INSERT / REPLACE / REMOVE PACEMAKER  11/19/2015   TONSILLECTOMY      Current Outpatient Medications  Medication Sig Dispense Refill   acetaminophen (TYLENOL) 500 MG tablet Take 2 tablets (1,000 mg total) by mouth 4 (four) times daily. 30 tablet 0   bisoprolol (ZEBETA) 5 MG tablet Take 0.5 tablets (2.5 mg total) by mouth at bedtime. 45 tablet 3   dronabinol (MARINOL) 2.5 MG capsule Take 2.5 mg by mouth daily as needed (appetite).     feeding supplement (ENSURE ENLIVE / ENSURE PLUS) LIQD Take 237 mLs by mouth 3 (three) times daily between meals. 237 mL 12   ibuprofen (ADVIL) 200 MG tablet Take 200 mg by mouth every 6 (six) hours as needed for moderate pain.     methimazole (TAPAZOLE) 10 MG tablet Take 5 mg by mouth daily.     ondansetron (ZOFRAN) 8 MG tablet 1 tablet as needed     pantoprazole (PROTONIX) 40 MG tablet Take 1 tablet (40 mg total) by mouth daily before supper. 30 tablet 0   Cholecalciferol (VITAMIN D3) 25 MCG (1000 UT) CAPS Take 1 capsule by mouth daily. (Patient not taking: Reported on 01/23/2021)     No current facility-administered medications for this visit.    Allergies:   Carvedilol,  Sulfa antibiotics, and Sulfamethoxazole   Social History: Social History   Socioeconomic History   Marital status: Widowed    Spouse name: Not on file   Number of children: Not on file   Years of education: Not on file   Highest education level: Not on file  Occupational History   Not on file  Tobacco Use   Smoking status: Never   Smokeless tobacco: Never  Vaping Use   Vaping Use: Never used  Substance and Sexual Activity   Alcohol use: No   Drug use: No   Sexual activity: Not Currently    Birth control/protection: Post-menopausal  Other Topics Concern   Not on file  Social History Narrative   Not on file   Social Determinants of Health   Financial Resource Strain: Not on file  Food  Insecurity: Not on file  Transportation Needs: Not on file  Physical Activity: Not on file  Stress: Not on file  Social Connections: Not on file  Intimate Partner Violence: Not on file    Family History: Family History  Problem Relation Age of Onset   Cancer Sister      Review of Systems: All other systems reviewed and are otherwise negative except as noted above.  Physical Exam: Vitals:   01/23/21 1315  BP: (!) 112/56  Pulse: 71  Resp: 14  SpO2: 96%  Weight: 102 lb 3.2 oz (46.4 kg)     GEN- The patient is well appearing, alert and oriented x 3 today.   HEENT: normocephalic, atraumatic; sclera clear, conjunctiva pink; hearing intact; oropharynx clear; neck supple  Lungs- Clear to ausculation bilaterally, normal work of breathing.  No wheezes, rales, rhonchi Heart- Regular rate and rhythm, no murmurs, rubs or gallops  GI- soft, non-tender, non-distended, bowel sounds present  Extremities- no clubbing or cyanosis. No edema MS- no significant deformity or atrophy Skin- warm and dry, no rash or lesion; PPM pocket well healed Psych- euthymic mood, full affect Neuro- strength and sensation are intact  PPM Interrogation- reviewed in detail today,  See PACEART report  EKG:  EKG is not ordered today.  Recent Labs: 06/24/2020: ALT 16 01/09/2021: BUN 11; Creatinine, Ser 0.91; Hemoglobin 11.8; Platelets 300; Potassium 5.4; Sodium 137   Wt Readings from Last 3 Encounters:  01/23/21 102 lb 3.2 oz (46.4 kg)  01/09/21 107 lb (48.5 kg)  11/20/20 110 lb (49.9 kg)     Other studies Reviewed: Additional studies/ records that were reviewed today include: Previous EP office notes, Previous remote checks, Most recent labwork.   Assessment and Plan:  1.  Intermittent CHB  s/p St. Jude PPM  She is NOT dependent at this point. AP <3% VP <1% Narrow QRS, not CRT candidate Normal PPM function See Pace Art report No changes today  2. Chronic systolic CHF New diagnosis Echo  12/26/2020 LVEF 25-30% down from 45-50% 2016 Pre-test probability of Myoview is relatively low with previously near normal EF at age of 39. Dr. Lovena Hoffman agrees. Continue bisoprolol 2.5 mg daily K runs high. Repeat BMET today. May consider losartan but would likely require concomitant use of veltassa or similar. Would greatly appreciate clinical pharmacist assistance if this is the case.   I have reviewed at length with the son that I worry with her age and overall frailty, polypharmacy is just as likely to cause complications as to improve her overall functional status. Her overall deconditioning is likely multifactorial and addressing any one issue is unlikely to improve her overall status.  I have encouraged him to prepare for this eventuality. He states they have an unsigned DNR form at home, but that "she would want everything done".  3. Hyperkalemia Very likely to limit GDMT Follow closely.  BMET today  4. Reactive airway disease Starting on MDIs and nebulizer.  Hope that this will bring her some relief.   Very complicated situation.    Current medicines are reviewed at length with the patient today.   The patient does not have concerns regarding her medicines.  The following changes were made today:  none  Labs/ tests ordered today include:  Orders Placed This Encounter  Procedures   Basic metabolic panel    Disposition:   Follow up with EP APP in 4 Weeks. Consider clinical pharmacy close follow up based on labwork today.    Jacalyn Lefevre, PA-C  01/23/2021 2:00 PM  [address] [place] [address] [phone] (office) [phone] (fax)

## 2021-01-23 NOTE — Patient Instructions (Addendum)
Medication Instructions:  Your physician recommends that you continue on your current medications as directed. Please refer to the Current Medication list given to you today.  *If you need a refill on your cardiac medications before your next appointment, please call your pharmacy*   Lab Work: BMET today If you have labs (blood work) drawn today and your tests are completely normal, you will receive your results only by: [place] (if you have MyChart) OR A paper copy in the mail If you have any lab test that is abnormal or we need to change your treatment, we will call you to review the results.   Testing/Procedures: None ordered.    Follow-Up: At [facility], you and your health needs are our priority.  As part of our continuing mission to provide you with exceptional heart care, we have created designated Provider Care Teams.  These Care Teams include your primary Cardiologist (physician) and Advanced Practice Providers (APPs -  Physician Assistants and Nurse Practitioners) who all work together to provide you with the care you need, when you need it.  We recommend signing up for the patient portal called "MyChart".  Sign up information is provided on this After Visit Summary.  MyChart is used to connect with patients for Virtual Visits (Telemedicine).  Patients are able to view lab/test results, encounter notes, upcoming appointments, etc.  Non-urgent messages can be sent to your provider as well.   To learn more about what you can do with MyChart, go to [email or web address].    Your next appointment:    Thursday, 03/07/2021 at 12 noon with Oda Kilts, PA-C

## 2021-01-23 NOTE — Telephone Encounter (Signed)
Patient's son Gabrielle Hoffman ask that you give cousin Jackelyn Poling a call in regards to his mother appt that she had today. He stated that she can be reach at the two numbers we have on file. Son ask once we reach out to Garrett to please let him know that someone spoke with her. Please advise

## 2021-01-24 ENCOUNTER — Other Ambulatory Visit: Payer: Self-pay | Admitting: Internal Medicine

## 2021-01-24 DIAGNOSIS — Z8781 Personal history of (healed) traumatic fracture: Secondary | ICD-10-CM

## 2021-01-24 DIAGNOSIS — Z13820 Encounter for screening for osteoporosis: Secondary | ICD-10-CM

## 2021-01-24 LAB — BASIC METABOLIC PANEL
BUN/Creatinine Ratio: 20 (ref 12–28)
BUN: 17 mg/dL (ref 8–27)
CO2: 22 mmol/L (ref 20–29)
Calcium: 9.3 mg/dL (ref 8.7–10.3)
Chloride: 100 mmol/L (ref 96–106)
Creatinine, Ser: 0.85 mg/dL (ref 0.57–1.00)
Glucose: 118 mg/dL — ABNORMAL HIGH (ref 65–99)
Potassium: 4.5 mmol/L (ref 3.5–5.2)
Sodium: 139 mmol/L (ref 134–144)
eGFR: 66 mL/min/{1.73_m2} (ref >59)

## 2021-01-25 NOTE — Telephone Encounter (Signed)
Spoke with the patient's son (DPR on file) regarding the patient's arm being wrapped too tight after lab draw on 6/8. Son was also asking if it was okay that they cancelled the follow up appointment with Oda Kilts, PA on 8/4 and just wait to be seen on 8/30 with Dr. Lovena Le. Spoke with Andy's covering Stamps and let the son know that Jonni Sanger was okay with waiting to see Dr. Lovena Le on 8/30. Instructed for the son to let us know if she develops any issues prior to that time. Son verbalized understanding and thanked me for the call.

## 2021-01-29 ENCOUNTER — Other Ambulatory Visit: Payer: Self-pay

## 2021-01-29 ENCOUNTER — Ambulatory Visit
Admission: RE | Admit: 2021-01-29 | Discharge: 2021-01-29 | Disposition: A | Discharge status: Home / Self Care | Payer: Medicare Other | Source: Ambulatory Visit | Attending: Internal Medicine | Admitting: Internal Medicine

## 2021-01-29 DIAGNOSIS — Z13820 Encounter for screening for osteoporosis: Secondary | ICD-10-CM

## 2021-01-29 DIAGNOSIS — Z780 Asymptomatic menopausal state: Secondary | ICD-10-CM | POA: Diagnosis not present

## 2021-01-29 DIAGNOSIS — Z8781 Personal history of (healed) traumatic fracture: Secondary | ICD-10-CM

## 2021-01-29 DIAGNOSIS — M810 Age-related osteoporosis without current pathological fracture: Secondary | ICD-10-CM | POA: Diagnosis not present

## 2021-02-06 ENCOUNTER — Telehealth: Payer: Self-pay | Admitting: Student

## 2021-02-06 NOTE — Telephone Encounter (Signed)
Pt c/o swelling: STAT is pt has developed SOB within 24 hours  If swelling, where is the swelling located? Feet   How much weight have you gained and in what time span? Not sure. Son does not weigh patient   Have you gained 3 pounds in a day or 5 pounds in a week?   Do you have a log of your daily weights (if so, list)? no  Are you currently taking a fluid pill? no  Are you currently SOB? no  Have you traveled recently? No  Son noticed that the patient's feet were swollen yesterday when they were sitting on the porch. He checked on her this morning and she was back to normal. This was the first time this happened and the Son was not sure what to do. Please advise

## 2021-02-06 NOTE — Telephone Encounter (Signed)
Spoke with pt's son, DPR who reports pt had some mild ankle swelling last night that was noted while she was sitting.  Pt does not usually have a problem with swelling.  Pt has been diagnosed with systolic CHF.  Pt's son denies new or increased SOB or CP.  Son states swelling resolved over night.  Pt drinks mostly starbucks coffee.  Encouraged to increase water intake as pt is not on fluid restriction, reduce sodium intake by not adding salt to food and being mindful of preserved foods with hidden salt and elevating feet and legs when sitting.  Pt has appt with Dr Lovena Le 04/02/2021 and is aware.  Pt's son verbalizes understanding and agrees with current plan.  Will call if pt's symptoms increase.

## 2021-02-08 ENCOUNTER — Ambulatory Visit (INDEPENDENT_AMBULATORY_CARE_PROVIDER_SITE_OTHER): Payer: Medicare Other

## 2021-02-08 DIAGNOSIS — R55 Syncope and collapse: Secondary | ICD-10-CM | POA: Diagnosis not present

## 2021-02-10 LAB — CUP PACEART REMOTE DEVICE CHECK
Battery Remaining Longevity: 69 mo
Battery Remaining Percentage: 53 %
Battery Voltage: 2.99 V
Brady Statistic AP VP Percent: 1 %
Brady Statistic AP VS Percent: 15 %
Brady Statistic AS VP Percent: 1 %
Brady Statistic AS VS Percent: 79 %
Brady Statistic RA Percent Paced: 9.5 %
Brady Statistic RV Percent Paced: 1 %
Date Time Interrogation Session: 20220708020013
Implantable Lead Implant Date: 20170417
Implantable Lead Implant Date: 20170417
Implantable Lead Location: 753859
Implantable Lead Location: 753860
Implantable Pulse Generator Implant Date: 20170417
Lead Channel Impedance Value: 400 Ohm
Lead Channel Impedance Value: 430 Ohm
Lead Channel Pacing Threshold Amplitude: 0.5 V
Lead Channel Pacing Threshold Amplitude: 0.75 V
Lead Channel Pacing Threshold Pulse Width: 0.5 ms
Lead Channel Pacing Threshold Pulse Width: 0.5 ms
Lead Channel Sensing Intrinsic Amplitude: 0.5 mV
Lead Channel Sensing Intrinsic Amplitude: 10.8 mV
Lead Channel Setting Pacing Amplitude: 1 V
Lead Channel Setting Pacing Amplitude: 1.5 V
Lead Channel Setting Pacing Pulse Width: 0.5 ms
Lead Channel Setting Sensing Sensitivity: 2 mV
Pulse Gen Model: 2272
Pulse Gen Serial Number: 7883162

## 2021-02-26 DIAGNOSIS — H401112 Primary open-angle glaucoma, right eye, moderate stage: Secondary | ICD-10-CM | POA: Diagnosis not present

## 2021-02-26 DIAGNOSIS — H401122 Primary open-angle glaucoma, left eye, moderate stage: Secondary | ICD-10-CM | POA: Diagnosis not present

## 2021-02-26 DIAGNOSIS — H52223 Regular astigmatism, bilateral: Secondary | ICD-10-CM | POA: Diagnosis not present

## 2021-02-26 DIAGNOSIS — H5203 Hypermetropia, bilateral: Secondary | ICD-10-CM | POA: Diagnosis not present

## 2021-02-26 DIAGNOSIS — H524 Presbyopia: Secondary | ICD-10-CM | POA: Diagnosis not present

## 2021-03-01 NOTE — Progress Notes (Signed)
Remote pacemaker transmission.   

## 2021-03-04 ENCOUNTER — Encounter: Payer: Medicare Other | Admitting: Student

## 2021-03-07 ENCOUNTER — Encounter: Payer: Medicare Other | Admitting: Student

## 2021-03-12 DIAGNOSIS — H25813 Combined forms of age-related cataract, bilateral: Secondary | ICD-10-CM | POA: Diagnosis not present

## 2021-03-12 DIAGNOSIS — H52223 Regular astigmatism, bilateral: Secondary | ICD-10-CM | POA: Diagnosis not present

## 2021-03-12 DIAGNOSIS — H401132 Primary open-angle glaucoma, bilateral, moderate stage: Secondary | ICD-10-CM | POA: Diagnosis not present

## 2021-03-12 DIAGNOSIS — H5203 Hypermetropia, bilateral: Secondary | ICD-10-CM | POA: Diagnosis not present

## 2021-03-12 DIAGNOSIS — H524 Presbyopia: Secondary | ICD-10-CM | POA: Diagnosis not present

## 2021-03-26 DIAGNOSIS — R300 Dysuria: Secondary | ICD-10-CM | POA: Diagnosis not present

## 2021-03-26 DIAGNOSIS — E0590 Thyrotoxicosis, unspecified without thyrotoxic crisis or storm: Secondary | ICD-10-CM | POA: Diagnosis not present

## 2021-03-26 DIAGNOSIS — J4520 Mild intermittent asthma, uncomplicated: Secondary | ICD-10-CM | POA: Diagnosis not present

## 2021-03-26 DIAGNOSIS — E0500 Thyrotoxicosis with diffuse goiter without thyrotoxic crisis or storm: Secondary | ICD-10-CM | POA: Diagnosis not present

## 2021-03-26 DIAGNOSIS — I10 Essential (primary) hypertension: Secondary | ICD-10-CM | POA: Diagnosis not present

## 2021-03-26 DIAGNOSIS — F0280 Dementia in other diseases classified elsewhere without behavioral disturbance: Secondary | ICD-10-CM | POA: Diagnosis not present

## 2021-03-26 DIAGNOSIS — G301 Alzheimer's disease with late onset: Secondary | ICD-10-CM | POA: Diagnosis not present

## 2021-03-26 DIAGNOSIS — D649 Anemia, unspecified: Secondary | ICD-10-CM | POA: Diagnosis not present

## 2021-04-02 ENCOUNTER — Ambulatory Visit (INDEPENDENT_AMBULATORY_CARE_PROVIDER_SITE_OTHER): Payer: Medicare Other | Admitting: Internal Medicine

## 2021-04-02 ENCOUNTER — Other Ambulatory Visit: Payer: Self-pay

## 2021-04-02 VITALS — BP 172/70 | HR 91 | Ht 66.0 in | Wt 106.6 lb

## 2021-04-02 DIAGNOSIS — I5022 Chronic systolic (congestive) heart failure: Secondary | ICD-10-CM | POA: Diagnosis not present

## 2021-04-02 MED ORDER — POTASSIUM CHLORIDE ER 10 MEQ PO TBCR: 10.0000 meq | EXTENDED_RELEASE_TABLET | Freq: Every day | ORAL | 1 refills | Status: DC

## 2021-04-02 MED ORDER — BISOPROLOL FUMARATE 5 MG PO TABS: 5.0000 mg | ORAL_TABLET | Freq: Every day | ORAL | 3 refills | Status: DC

## 2021-04-02 MED ORDER — FUROSEMIDE 20 MG PO TABS: 20.0000 mg | ORAL_TABLET | Freq: Every day | ORAL | 1 refills | Status: DC

## 2021-04-02 NOTE — Progress Notes (Signed)
HPI Ms. Weisberg returns today for followup. She is a pleasant 87 yo woman with dementia, HTN, sinus node dysfunction, s/p PPM insertion. She denies chest pain or sob. She has had some dizzy spells. No syncope. Her son is with her today and notes that she has done ok but maybe a little more dyspneic. Her bp is up as well. She has not had any swelling. She tends to repeat herself but remembers things well.  Allergies  Allergen Reactions   Carvedilol Other (See Comments)   Sulfa Antibiotics Rash   Sulfamethoxazole Rash     Current Outpatient Medications  Medication Sig Dispense Refill   acetaminophen (TYLENOL) 500 MG tablet Take 2 tablets (1,000 mg total) by mouth 4 (four) times daily. 30 tablet 0   bisoprolol (ZEBETA) 5 MG tablet Take 0.5 tablets (2.5 mg total) by mouth at bedtime. 45 tablet 3   Cholecalciferol (VITAMIN D3) 25 MCG (1000 UT) CAPS Take 1 capsule by mouth daily.     dronabinol (MARINOL) 2.5 MG capsule Take 2.5 mg by mouth daily as needed (appetite).     feeding supplement (ENSURE ENLIVE / ENSURE PLUS) LIQD Take 237 mLs by mouth 3 (three) times daily between meals. 237 mL 12   ibuprofen (ADVIL) 200 MG tablet Take 200 mg by mouth every 6 (six) hours as needed for moderate pain.     methimazole (TAPAZOLE) 10 MG tablet Take 5 mg by mouth daily.     ondansetron (ZOFRAN) 8 MG tablet 1 tablet as needed     pantoprazole (PROTONIX) 40 MG tablet Take 1 tablet (40 mg total) by mouth daily before supper. 30 tablet 0   No current facility-administered medications for this visit.     Past Medical History:  Diagnosis Date   Asthma    DDD (degenerative disc disease), lumbar    Dementia    very mild    Depression    Disc disorder of cervical region    Diverticulosis    Hyperlipidemia    Hypertension    Hyponatremia 12/17/2011   Kidney stones    "passed them"   Memory loss    Mitral valve prolapse    Osteopenia    Pneumonia    "I believe I had it a long time ago"    Presence of permanent cardiac pacemaker    Syncope    UTI (lower urinary tract infection)    Vaginal prolapse    Venous insufficiency     ROS:   All systems reviewed and negative except as noted in the HPI.   Past Surgical History:  Procedure Laterality Date   ABDOMINAL HYSTERECTOMY     APPENDECTOMY     EP IMPLANTABLE DEVICE N/A 08/27/2015   Procedure: Loop Recorder Insertion;  Surgeon: Evans Lance, MD;  Location: [facility];  Service: Cardiovascular;  Laterality: N/A;   EP IMPLANTABLE DEVICE N/A 11/19/2015   Procedure: Pacemaker Implant;  Surgeon: Evans Lance, MD;  Location: [facility];  Service: Cardiovascular;  Laterality: N/A;   INSERT / REPLACE / REMOVE PACEMAKER  11/19/2015   TONSILLECTOMY       Family History  Problem Relation Age of Onset   Cancer Sister      Social History   Socioeconomic History   Marital status: Widowed    Spouse name: Not on file   Number of children: Not on file   Years of education: Not on file   Highest education level: Not  on file  Occupational History   Not on file  Tobacco Use   Smoking status: Never   Smokeless tobacco: Never  Vaping Use   Vaping Use: Never used  Substance and Sexual Activity   Alcohol use: No   Drug use: No   Sexual activity: Not Currently    Birth control/protection: Post-menopausal  Other Topics Concern   Not on file  Social History Narrative   Not on file   Social Determinants of Health   Financial Resource Strain: Not on file  Food Insecurity: Not on file  Transportation Needs: Not on file  Physical Activity: Not on file  Stress: Not on file  Social Connections: Not on file  Intimate Partner Violence: Not on file     BP (!) 172/70   Pulse 91   Ht '5\' 6"'$  (1.676 m)   Wt 106 lb 9.6 oz (48.4 kg)   SpO2 95%   BMI 17.21 kg/m   Physical Exam:  Well appearing NAD HEENT: Unremarkable Neck:  No JVD, no thyromegally Lymphatics:  No adenopathy Back:  No CVA  tenderness Lungs:  Clear with no wheezes HEART:  Regular rate rhythm, no murmurs, no rubs, no clicks Abd:  soft, positive bowel sounds, no organomegally, no rebound, no guarding Ext:  2 plus pulses, no edema, no cyanosis, no clubbing Skin:  No rashes no nodules Neuro:  CN II through XII intact, motor grossly intact   DEVICE  Normal device function.  See PaceArt for details.   Assess/Plan:  1. Sinus node dysfunction - she is asymptomatic, s/p PPM insertion. 2. PPM - her St. Jude DDD PM is working normally. No atrial fib. 3. Dementia - she is off of her Aricept.  4. Dyspnea - her son notes that she appears to be more sob. She denies. I have recommended a trial of low dose lasix. If the lasix improves her sob and reduces her sighing, then she will continue with 10 meq of lasix concomitantly. If the lasix is helping she will return for labs. If not helping then she will stop the lasix and potassium. 5. HTN - her bp is up today and I have recommended she increase her dose of bisoprolol to 5 mg daily.  Carleene Overlie Matika Bartell,MD

## 2021-04-02 NOTE — Patient Instructions (Addendum)
Medication Instructions:  1) START Furosemide '20mg'$  once daily.  Let us know if breathing improves and we will continue this medication. 2) START Potassium 43mq once daily.  Only take this medication if you are taking your Furosemide. 3) INCREASE Bisoprolol to '5mg'$  once daily  *If you need a refill on your cardiac medications before your next appointment, please call your pharmacy*   Lab Work: BMET in 3 weeks  If you have labs (blood work) drawn today and your tests are completely normal, you will receive your results only by: [place](if you have MyChart) OR A paper copy in the mail If you have any lab test that is abnormal or we need to change your treatment, we will call you to review the results.   Testing/Procedures: None   Follow-Up: At [facility] you and your health needs are our priority.  As part of our continuing mission to provide you with exceptional heart care, we have created designated Provider Care Teams.  These Care Teams include your primary Cardiologist (physician) and Advanced Practice Providers (APPs -  Physician Assistants and Nurse Practitioners) who all work together to provide you with the care you need, when you need it.  We recommend signing up for the patient portal called "MyChart".  Sign up information is provided on this After Visit Summary.  MyChart is used to connect with patients for Virtual Visits (Telemedicine).  Patients are able to view lab/test results, encounter notes, upcoming appointments, etc.  Non-urgent messages can be sent to your provider as well.   To learn more about what you can do with MyChart, go to [email or web address]    Your next appointment:   1 year(s)  The format for your next appointment:   In Person  Provider:   You may see GCristopher Peru MD or one of the following Advanced Practice Providers on your designated Care Team:   RTommye Standard PVermontMLegrand Como"AJonni Sanger TChalmers Cater PVermont Remote monitoring is used to  monitor your Pacemaker from home. This monitoring reduces the number of office visits required to check your device to one time per year. It allows u[place]ato keep an eye on the functioning of your device to ensure it is working properly. You are scheduled for a device check from home on 05/10/2021. You may send your transmission at any time that day. If you have a wireless device, the transmission will be sent automatically. After your physician reviews your transmission, you will receive a postcard with your next transmission date.  Other Instructions

## 2021-04-23 ENCOUNTER — Other Ambulatory Visit: Payer: Self-pay

## 2021-04-23 ENCOUNTER — Other Ambulatory Visit: Payer: Medicare Other

## 2021-04-23 DIAGNOSIS — I5022 Chronic systolic (congestive) heart failure: Secondary | ICD-10-CM | POA: Diagnosis not present

## 2021-04-23 DIAGNOSIS — R350 Frequency of micturition: Secondary | ICD-10-CM | POA: Diagnosis not present

## 2021-04-24 LAB — BASIC METABOLIC PANEL
BUN/Creatinine Ratio: 23 (ref 12–28)
BUN: 20 mg/dL (ref 8–27)
CO2: 23 mmol/L (ref 20–29)
Calcium: 9.8 mg/dL (ref 8.7–10.3)
Chloride: 92 mmol/L — ABNORMAL LOW (ref 96–106)
Creatinine, Ser: 0.87 mg/dL (ref 0.57–1.00)
Glucose: 97 mg/dL (ref 65–99)
Potassium: 5.2 mmol/L (ref 3.5–5.2)
Sodium: 131 mmol/L — ABNORMAL LOW (ref 134–144)
eGFR: 64 mL/min/{1.73_m2} (ref >59)

## 2021-05-10 ENCOUNTER — Ambulatory Visit (INDEPENDENT_AMBULATORY_CARE_PROVIDER_SITE_OTHER): Payer: Medicare Other

## 2021-05-10 DIAGNOSIS — I5022 Chronic systolic (congestive) heart failure: Secondary | ICD-10-CM

## 2021-05-10 DIAGNOSIS — R350 Frequency of micturition: Secondary | ICD-10-CM | POA: Diagnosis not present

## 2021-05-14 LAB — CUP PACEART REMOTE DEVICE CHECK
Battery Remaining Longevity: 65 mo
Battery Remaining Percentage: 51 %
Battery Voltage: 2.99 V
Brady Statistic AP VP Percent: 12 %
Brady Statistic AP VS Percent: 22 %
Brady Statistic AS VP Percent: 1 %
Brady Statistic AS VS Percent: 60 %
Brady Statistic RA Percent Paced: 27 %
Brady Statistic RV Percent Paced: 13 %
Date Time Interrogation Session: 20221007020012
Implantable Lead Implant Date: 20170417
Implantable Lead Implant Date: 20170417
Implantable Lead Location: 753859
Implantable Lead Location: 753860
Implantable Pulse Generator Implant Date: 20170417
Lead Channel Impedance Value: 430 Ohm
Lead Channel Impedance Value: 440 Ohm
Lead Channel Pacing Threshold Amplitude: 0.5 V
Lead Channel Pacing Threshold Amplitude: 0.625 V
Lead Channel Pacing Threshold Pulse Width: 0.5 ms
Lead Channel Pacing Threshold Pulse Width: 0.5 ms
Lead Channel Sensing Intrinsic Amplitude: 0.7 mV
Lead Channel Sensing Intrinsic Amplitude: 11.7 mV
Lead Channel Setting Pacing Amplitude: 0.875
Lead Channel Setting Pacing Amplitude: 1.5 V
Lead Channel Setting Pacing Pulse Width: 0.5 ms
Lead Channel Setting Sensing Sensitivity: 2 mV
Pulse Gen Model: 2272
Pulse Gen Serial Number: 7883162

## 2021-05-20 NOTE — Progress Notes (Signed)
Remote pacemaker transmission.   

## 2021-05-21 DIAGNOSIS — H2512 Age-related nuclear cataract, left eye: Secondary | ICD-10-CM | POA: Diagnosis not present

## 2021-05-21 DIAGNOSIS — H18413 Arcus senilis, bilateral: Secondary | ICD-10-CM | POA: Diagnosis not present

## 2021-05-21 DIAGNOSIS — H25013 Cortical age-related cataract, bilateral: Secondary | ICD-10-CM | POA: Diagnosis not present

## 2021-05-21 DIAGNOSIS — H353132 Nonexudative age-related macular degeneration, bilateral, intermediate dry stage: Secondary | ICD-10-CM | POA: Diagnosis not present

## 2021-05-21 DIAGNOSIS — H2513 Age-related nuclear cataract, bilateral: Secondary | ICD-10-CM | POA: Diagnosis not present

## 2021-05-21 DIAGNOSIS — H25043 Posterior subcapsular polar age-related cataract, bilateral: Secondary | ICD-10-CM | POA: Diagnosis not present

## 2021-07-08 DIAGNOSIS — R300 Dysuria: Secondary | ICD-10-CM | POA: Diagnosis not present

## 2021-07-16 DIAGNOSIS — E0500 Thyrotoxicosis with diffuse goiter without thyrotoxic crisis or storm: Secondary | ICD-10-CM | POA: Diagnosis not present

## 2021-07-16 DIAGNOSIS — D649 Anemia, unspecified: Secondary | ICD-10-CM | POA: Diagnosis not present

## 2021-07-16 DIAGNOSIS — E0590 Thyrotoxicosis, unspecified without thyrotoxic crisis or storm: Secondary | ICD-10-CM | POA: Diagnosis not present

## 2021-07-16 DIAGNOSIS — R350 Frequency of micturition: Secondary | ICD-10-CM | POA: Diagnosis not present

## 2021-08-09 ENCOUNTER — Ambulatory Visit (INDEPENDENT_AMBULATORY_CARE_PROVIDER_SITE_OTHER): Payer: Medicare PPO

## 2021-08-09 DIAGNOSIS — R55 Syncope and collapse: Secondary | ICD-10-CM | POA: Diagnosis not present

## 2021-08-12 ENCOUNTER — Telehealth: Payer: Self-pay

## 2021-08-12 LAB — CUP PACEART REMOTE DEVICE CHECK
Battery Remaining Longevity: 62 mo
Battery Remaining Percentage: 48 %
Battery Voltage: 2.98 V
Brady Statistic AP VP Percent: 13 %
Brady Statistic AP VS Percent: 13 %
Brady Statistic AS VP Percent: 28 %
Brady Statistic AS VS Percent: 43 %
Brady Statistic RA Percent Paced: 23 %
Brady Statistic RV Percent Paced: 41 %
Date Time Interrogation Session: 20230106020017
Implantable Lead Implant Date: 20170417
Implantable Lead Implant Date: 20170417
Implantable Lead Location: 753859
Implantable Lead Location: 753860
Implantable Pulse Generator Implant Date: 20170417
Lead Channel Impedance Value: 410 Ohm
Lead Channel Impedance Value: 450 Ohm
Lead Channel Pacing Threshold Amplitude: 0.5 V
Lead Channel Pacing Threshold Amplitude: 0.625 V
Lead Channel Pacing Threshold Pulse Width: 0.5 ms
Lead Channel Pacing Threshold Pulse Width: 0.5 ms
Lead Channel Sensing Intrinsic Amplitude: 0.7 mV
Lead Channel Sensing Intrinsic Amplitude: 7.6 mV
Lead Channel Setting Pacing Amplitude: 0.875
Lead Channel Setting Pacing Amplitude: 1.5 V
Lead Channel Setting Pacing Pulse Width: 0.5 ms
Lead Channel Setting Sensing Sensitivity: 2 mV
Pulse Gen Model: 2272
Pulse Gen Serial Number: 7883162

## 2021-08-12 NOTE — Telephone Encounter (Signed)
Scheduled remote reviewed. Normal device function.   4 brief AMS showing FF oversensing VP 41%, previous remote 13%, route to triage  Noted increase VP occurred somewhere around late October.  Nothing to indicate why this occurred.    Spoke with patient and her son, [name].  Pt son reports that he has noticed an increase in fatigue and SOB over the last 2 months.  Otherwise there has not been any change in medications.    Advised given this sudden increase in pacing and the patients noted symptoms, it would be advisable to have f/u with APP.  Pt sons reports they are on the way out for pt to have cataract surgery.  He requests a callback tomorrow from scheduling to make APP appt.

## 2021-08-13 NOTE — Telephone Encounter (Signed)
Follow up  Pt son would like to speak to RN. He states that they were told the pts heart has been out of rhythm since October and wondered why they weren't contacted sooner. He said that they are going to an appt and would like a call after lunch.

## 2021-08-13 NOTE — Telephone Encounter (Signed)
Successful telephone encounter to patients son to answer additional questions regarding patients "heart being out of rhythm". Remote reviewed. Discussed difference between "out of rhythm" and increase in RV pacing. Son states he has a better understanding of need for appointment 08/28/21. Appreciative of follow up.

## 2021-08-20 NOTE — Progress Notes (Signed)
Remote pacemaker transmission.   

## 2021-08-25 NOTE — Progress Notes (Signed)
Cardiology Office Note Date:  08/25/2021  Patient ID:  Gabrielle Hoffman, Gabrielle Hoffman Jun 24, 1931, MRN [id number] PCP:  Lajean Manes, MD  Cardiologist/Electrophysiologist:  Dr. Lovena Le   Chief Complaint:    increase RV pacing %   History of Present Illness: Gabrielle Hoffman is a 88 y.o. female with history of syncope and sinus pauses, s/p PPM implant April 2017, HLD, HTN, mild memory loss, dementia, LV dysfunction (EF 45-50%), chronic LBBB, CM (likely NICM)    I saw her 09/12/20 She comes accompanied by her son. She reports she feels OK Denies to  Me any SOB, or coughing (her son reminds me of her dementia) She denies any CP, palpitations or cardiac awareness. No reports of dizziness, near syncope or syncope. Her son tells me that her cough is perhaps better but continues. He had noted more frequent urination with the addition of lasix. He notes it as a dry, nonproductive cough, most often noted in the evenings after dinner and her night meds. He has also noted no overt SOB but some ties seems to have a stutter to her breath, or a few quick inhales now and then. He noted that it started about 99mo ago after a hospital stay with a broken hip that apparently was complicated with what sounds like terrible nausea and some degree of bowel obstruction.  She remains with some intermittent constipation issues and occasional nausea. Follows with [facility]  The patient denies any belly pain or tenderness, eating well.  Strong suspicion her cough was GERD and advised raining HOB and not going to bed so soon after dinner, and also to f/u with her PMD and/or GI MD She did not appear volume OL   I saw her 11/20/20 She is again with her son today They are in a bit of a hurry with an appt with Dr. Felipa Eth today as well. The patient denies any particular concerns, denies SOB Her son though add that the nighttime cough after dinner particularly continued Stopped the lasix because it did not seem to  help. He tried to have her stay up longer after dinner but is pretty insistent on getting into bed shortly afterwards.  It is an dry irritative sounding cough No SOB, no symptoms of PND or orthopnea are reported They have not seen GI but will see Dr. Felipa Eth today. He mentions today that they walk outside fairly regularly and some days does quite well, and other days she has to stop ever few feet feeling winded. She denies any kind of CP, palpitations or cardiac awareness No reports of dizziness, near syncope or syncope. Planned for CXR and echo, recommended as well to discuss symptoms w/PMD  CXR w/NAP Echo noted LVEF reduced to 25-30% and recommended to come in for further discussion and management  She saw A. Chalmers Cater, PA-C 01/23/21, son was concerned about declining functional status, also apparently found to have reactive airway disease He discussed that Pre-test probability of Myoview is relatively low with previously near normal EF at age of 12. Dr. Lovena Le agreed. Continue bisoprolol 2.5 mg daily K runs high. Repeat BMET today. May consider losartan but would likely require concomitant use of veltassa or similar. Would greatly appreciate clinical pharmacist assistance if this is the case.   Also discussed at length with the son worried with her age and overall frailty, polypharmacy is just as likely to cause complications as to improve her overall functional status. Her overall deconditioning is likely multifactorial and addressing any one issue  is unlikely to improve her overall status. Encouraged him to prepare for this eventuality. He states they have an unsigned DNR form at home, but that "she would want everything done".  She saw Dr. Lovena Le 04/02/21, with some reports of SOB, planned for a trial of lasix, to continue if helping and come in for labs, stop if not.  Called to come in with an acute rise in RV pacing %, pt/son report increased SOB, otherwise at her baseine  TODAY The  patient is accompanied by her son, who helps/cares for her He is worried abut her ongoing functional decline Seems to have less energy No reports of CP, but notes that intermittently she seems to have a stutter to her breeathing, taking in some quick short breaths now and then. They walk on an occasional basis, once around th block and last week or so, once back to the house she seemed to lose her strength and have to catch herself on the car. She did not have syncope He asks about work-up/treatment for her weakened heart and is concerned that she is pacing more now.  Device information:  SJM PPM implanted 11/19/15, Dr. Lovena Le, SB, CHB, (ILR was removed)   Past Medical History:  Diagnosis Date   Asthma    DDD (degenerative disc disease), lumbar    Dementia    very mild    Depression    Disc disorder of cervical region    Diverticulosis    Hyperlipidemia    Hypertension    Hyponatremia 12/17/2011   Kidney stones    "passed them"   Memory loss    Mitral valve prolapse    Osteopenia    Pneumonia    "I believe I had it a long time ago"   Presence of permanent cardiac pacemaker    Syncope    UTI (lower urinary tract infection)    Vaginal prolapse    Venous insufficiency     Past Surgical History:  Procedure Laterality Date   ABDOMINAL HYSTERECTOMY     APPENDECTOMY     EP IMPLANTABLE DEVICE N/A 08/27/2015   Procedure: Loop Recorder Insertion;  Surgeon: Evans Lance, MD;  Location: [facility];  Service: Cardiovascular;  Laterality: N/A;   EP IMPLANTABLE DEVICE N/A 11/19/2015   Procedure: Pacemaker Implant;  Surgeon: Evans Lance, MD;  Location: [facility];  Service: Cardiovascular;  Laterality: N/A;   INSERT / REPLACE / REMOVE PACEMAKER  11/19/2015   TONSILLECTOMY      Current Outpatient Medications  Medication Sig Dispense Refill   acetaminophen (TYLENOL) 500 MG tablet Take 2 tablets (1,000 mg total) by mouth 4 (four) times daily. 30 tablet 0   bisoprolol  (ZEBETA) 5 MG tablet Take 1 tablet (5 mg total) by mouth at bedtime. 90 tablet 3   Cholecalciferol (VITAMIN D3) 25 MCG (1000 UT) CAPS Take 1 capsule by mouth daily.     dronabinol (MARINOL) 2.5 MG capsule Take 2.5 mg by mouth daily as needed (appetite).     feeding supplement (ENSURE ENLIVE / ENSURE PLUS) LIQD Take 237 mLs by mouth 3 (three) times daily between meals. 237 mL 12   furosemide (LASIX) 20 MG tablet Take 1 tablet (20 mg total) by mouth daily. 30 tablet 1   ibuprofen (ADVIL) 200 MG tablet Take 200 mg by mouth every 6 (six) hours as needed for moderate pain.     methimazole (TAPAZOLE) 10 MG tablet Take 5 mg by mouth daily.  ondansetron (ZOFRAN) 8 MG tablet 1 tablet as needed     pantoprazole (PROTONIX) 40 MG tablet Take 1 tablet (40 mg total) by mouth daily before supper. 30 tablet 0   potassium chloride (KLOR-CON) 10 MEQ tablet Take 1 tablet (10 mEq total) by mouth daily. 30 tablet 1   No current facility-administered medications for this visit.    Allergies:   Carvedilol, Sulfa antibiotics, and Sulfamethoxazole   Social History:  The patient  reports that she has never smoked. She has never used smokeless tobacco. She reports that she does not drink alcohol and does not use drugs.   Family History:  The patient's family history includes Cancer in her sister.  ROS:  Please see the history of present illness.  All other systems are reviewed and otherwise negative.   PHYSICAL EXAM:  VS:  There were no vitals taken for this visit. BMI: There is no height or weight on file to calculate BMI. Well nourished, though quite thinwell developed, in no acute distress  HEENT: normocephalic, atraumatic  Neck: no JVD, carotid bruits or masses Cardiac: RRR; no significant murmurs, no rubs, or gallops Lungs:  CTA b/l, no wheezing, rhonchi or rales  Abd: soft, nontender MS: no deformity, age appropriate, perhaps advanced atrophy Ext:  no edema  Skin: warm and dry, no rash Neuro:  No  gross deficits appreciated Psych: euthymic mood, full affect  PPM site is stable, no tethering or discomfort    EKG:  done today and reviewed by myself SR/ V paced  PPM  interrogation today: reviewed by myself: Battery and lead measurements are good She paces at 40bpm today 4 AMS, 2 are FF, 2 are true brief AF, longest 8 seconds <1% burden No HVR   12/26/20: TTE IMPRESSIONS   1. Left ventricular ejection fraction, by estimation, is 25 to 30%. The  left ventricle has severely decreased function. The left ventricle  demonstrates global hypokinesis. There is moderate left ventricular  hypertrophy of the basal-septal segment. Left  ventricular diastolic parameters are consistent with Grade I diastolic  dysfunction (impaired relaxation).   2. Right ventricular systolic function is normal. The right ventricular  size is normal.   3. Left atrial size was moderately dilated.   4. The mitral valve is normal in structure. No evidence of mitral valve  regurgitation. No evidence of mitral stenosis.   5. The aortic valve is normal in structure. Aortic valve regurgitation is  mild. No aortic stenosis is present.   6. The inferior vena cava is normal in size with greater than 50%  respiratory variability, suggesting right atrial pressure of 3 mmHg.    07/26/15: Echocardiogram Study Conclusions  - Left ventricle: The cavity size was normal. There was mild focal   basal hypertrophy of the septum. Systolic function was mildly   reduced, primarily due to systolic dyssynchrony. The estimated   ejection fraction was in the range of 45% to 50%. Wall motion was   normal; there were no regional wall motion abnormalities. There   was fusion of early and atrial contributions to ventricular   filling. The study is not technically sufficient to allow   evaluation of LV diastolic function. - Ventricular septum: Septal motion showed abnormal function,   dyssynergy, and paradox. These changes are  consistent with a left   bundle branch block. - Aortic valve: There was trivial regurgitation. - Mitral valve: Calcified annulus.    Recent Labs: 01/09/2021: Hemoglobin 11.8; Platelets 300 04/23/2021: BUN 20; Creatinine, Ser  0.87; Potassium 5.2; Sodium 131  No results found for requested labs within last 8760 hours.   CrCl cannot be calculated (Patient's most recent lab result is older than the maximum 21 days allowed.).   Wt Readings from Last 3 Encounters:  04/02/21 106 lb 9.6 oz (48.4 kg)  01/23/21 102 lb 3.2 oz (46.4 kg)  01/09/21 107 lb (48.5 kg)     Other studies reviewed: Additional studies/records reviewed today include: summarized above  ASSESSMENT AND PLAN:  1. Intermittent CHB, s/p PPM     Intact function, no programming changes made     AP 24%     VP 48 1%  2. HTN     On the lower side  3. Mild CM,  EF 45-50% by her echo in 2016 >> May 2022 25-30% Priro note discuss d/w Dr. Lovena Le, with low yield in inschemic w/u Jonni Sanger d/w son as well, this and concerns that addition of drugs/polypharmacy could be problematic for her No plans for ischemic eval She is V pacing and evice dependent today, will reduce her bisoprolol with BP on the lower side as well   4. [facility] follow  Given pt's son reports of ongoing functional decline, weakness will check labs as well    Labs today         Disposition: remotes,  back in a couple months, sooner if needed.   Current medicines are reviewed at length with the patient today.  The patient did not have any concerns regarding medicines.  Haywood Lasso, PA-C 08/25/2021 5:34 PM     [address] [place] [address] [phone] (office)  [phone] (fax)

## 2021-08-28 ENCOUNTER — Other Ambulatory Visit: Payer: Self-pay

## 2021-08-28 ENCOUNTER — Encounter: Payer: Self-pay | Admitting: Physician Assistant

## 2021-08-28 ENCOUNTER — Ambulatory Visit (INDEPENDENT_AMBULATORY_CARE_PROVIDER_SITE_OTHER): Payer: Medicare PPO | Admitting: Physician Assistant

## 2021-08-28 VITALS — BP 106/60 | HR 73 | Ht 66.0 in | Wt 104.0 lb

## 2021-08-28 DIAGNOSIS — Z79899 Other long term (current) drug therapy: Secondary | ICD-10-CM | POA: Diagnosis not present

## 2021-08-28 DIAGNOSIS — Z950 Presence of cardiac pacemaker: Secondary | ICD-10-CM

## 2021-08-28 DIAGNOSIS — I5022 Chronic systolic (congestive) heart failure: Secondary | ICD-10-CM

## 2021-08-28 DIAGNOSIS — I428 Other cardiomyopathies: Secondary | ICD-10-CM

## 2021-08-28 LAB — CUP PACEART INCLINIC DEVICE CHECK
Battery Remaining Longevity: 62 mo
Battery Voltage: 2.98 V
Brady Statistic RA Percent Paced: 24 %
Brady Statistic RV Percent Paced: 48 %
Date Time Interrogation Session: 20230125181544
Implantable Lead Implant Date: 20170417
Implantable Lead Implant Date: 20170417
Implantable Lead Location: 753859
Implantable Lead Location: 753860
Implantable Pulse Generator Implant Date: 20170417
Lead Channel Impedance Value: 412.5 Ohm
Lead Channel Impedance Value: 437.5 Ohm
Lead Channel Pacing Threshold Amplitude: 0.5 V
Lead Channel Pacing Threshold Amplitude: 0.625 V
Lead Channel Pacing Threshold Pulse Width: 0.5 ms
Lead Channel Pacing Threshold Pulse Width: 0.5 ms
Lead Channel Sensing Intrinsic Amplitude: 1.1 mV
Lead Channel Sensing Intrinsic Amplitude: 11.5 mV
Lead Channel Setting Pacing Amplitude: 0.875
Lead Channel Setting Pacing Amplitude: 1.5 V
Lead Channel Setting Pacing Pulse Width: 0.5 ms
Lead Channel Setting Sensing Sensitivity: 2 mV
Pulse Gen Model: 2272
Pulse Gen Serial Number: 7883162

## 2021-08-28 MED ORDER — BISOPROLOL FUMARATE 5 MG PO TABS: 2.5000 mg | ORAL_TABLET | Freq: Every day | ORAL | 3 refills | Status: DC

## 2021-08-28 NOTE — Patient Instructions (Addendum)
Medication Instructions:   START TAKING BISOPROLOL 2.5 MG  ONCE A DAY  ( CUT 5 MG IN HALF )  *If you need a refill on your cardiac medications before your next appointment, please call your pharmacy*   Lab Work:  BMET AND CBC TODAY    If you have labs (blood work) drawn today and your tests are completely normal, you will receive your results only by: [place] (if you have MyChart) OR A paper copy in the mail If you have any lab test that is abnormal or we need to change your treatment, we will call you to review the results.   Testing/Procedures: NONE ORDERED  TODAY     Follow-Up: At [facility], you and your health needs are our priority.  As part of our continuing mission to provide you with exceptional heart care, we have created designated Provider Care Teams.  These Care Teams include your primary Cardiologist (physician) and Advanced Practice Providers (APPs -  Physician Assistants and Nurse Practitioners) who all work together to provide you with the care you need, when you need it.  We recommend signing up for the patient portal called "MyChart".  Sign up information is provided on this After Visit Summary.  MyChart is used to connect with patients for Virtual Visits (Telemedicine).  Patients are able to view lab/test results, encounter notes, upcoming appointments, etc.  Non-urgent messages can be sent to your provider as well.   To learn more about what you can do with MyChart, go to [email or web address].    Your next appointment:   2 month(s)  The format for your next appointment:   In Person  Provider:   You may see Cristopher Peru, MD or one of the following Advanced Practice Providers on your designated Care Team:   Tommye Standard, PA-C1}    Other Instructions

## 2021-08-29 LAB — CBC
Hematocrit: 33.9 % — ABNORMAL LOW (ref 34.0–46.6)
Hemoglobin: 11.3 g/dL (ref 11.1–15.9)
MCH: 29.5 pg (ref 26.6–33.0)
MCHC: 33.3 g/dL (ref 31.5–35.7)
MCV: 89 fL (ref 79–97)
Platelets: 357 10*3/uL (ref 150–450)
RBC: 3.83 x10E6/uL (ref 3.77–5.28)
RDW: 13.6 % (ref 11.7–15.4)
WBC: 7.1 10*3/uL (ref 3.4–10.8)

## 2021-08-29 LAB — BASIC METABOLIC PANEL
BUN/Creatinine Ratio: 14 (ref 12–28)
BUN: 12 mg/dL (ref 8–27)
CO2: 22 mmol/L (ref 20–29)
Calcium: 9.5 mg/dL (ref 8.7–10.3)
Chloride: 98 mmol/L (ref 96–106)
Creatinine, Ser: 0.83 mg/dL (ref 0.57–1.00)
Glucose: 99 mg/dL (ref 70–99)
Potassium: 4.9 mmol/L (ref 3.5–5.2)
Sodium: 135 mmol/L (ref 134–144)
eGFR: 68 mL/min/{1.73_m2} (ref >59)

## 2021-10-08 DIAGNOSIS — R350 Frequency of micturition: Secondary | ICD-10-CM | POA: Diagnosis not present

## 2021-10-20 NOTE — Progress Notes (Signed)
? ?Cardiology Office Note ?Date:  10/20/2021  ?Patient ID:  Gabrielle Hoffman [birth year], MRN [id number] ?PCP:  Lajean Manes, MD  ?Cardiologist/Electrophysiologist:  Dr. Lovena Le ?  ?Chief Complaint:    planned f/u  ? ?History of Present Illness: ?Gabrielle Hoffman is a 88 y.o. female with history of syncope and sinus pauses, s/p PPM implant April 2017, HLD, HTN, mild memory loss, dementia, LV dysfunction (EF 45-50%), chronic LBBB, CM (likely NICM)  ? ? ?I saw her 09/12/20 ?She comes accompanied by her son. ?She reports she feels OK ?Denies to  Me any SOB, or coughing (her son reminds me of her dementia) ?She denies any CP, palpitations or cardiac awareness. ?No reports of dizziness, near syncope or syncope. ?Her son tells me that her cough is perhaps better but continues. ?He had noted more frequent urination with the addition of lasix. ?He notes it as a dry, nonproductive cough, most often noted in the evenings after dinner and her night meds. ?He has also noted no overt SOB but some ties seems to have a stutter to her breath, or a few quick inhales now and then. ?He noted that it started about 9moago after a hospital stay with a broken hip that apparently was complicated with what sounds like terrible nausea and some degree of bowel obstruction.  She remains with some intermittent constipation issues and occasional nausea. ?Follows with [facility]  ?The patient denies any belly pain or tenderness, eating well. ? ?Strong suspicion her cough was GERD and advised raining HOB and not going to bed so soon after dinner, and also to f/u with her PMD and/or GI MD ?She did not appear volume OL ? ? ?I saw her 11/20/20 ?She is again with her son today ?They are in a bit of a hurry with an appt with Dr. SFelipa Ethtoday as well. ?The patient denies any particular concerns, denies SOB ?Her son though add that the nighttime cough after dinner particularly continued ?Stopped the lasix because it did not seem to help. ?He tried  to have her stay up longer after dinner but is pretty insistent on getting into bed shortly afterwards.  It is an dry irritative sounding cough ?No SOB, no symptoms of PND or orthopnea are reported ?They have not seen GI but will see Dr. SFelipa Ethtoday. ?He mentions today that they walk outside fairly regularly and some days does quite well, and other days she has to stop ever few feet feeling winded. ?She denies any kind of CP, palpitations or cardiac awareness ?No reports of dizziness, near syncope or syncope. ?Planned for CXR and echo, recommended as well to discuss symptoms w/PMD ? ?CXR w/NAP ?Echo noted LVEF reduced to 25-30% and recommended to come in for further discussion and management ? ?She saw A. Tillery, PA-C 01/23/21, son was concerned about declining functional status, also apparently found to have reactive airway disease ?He discussed that ?Pre-test probability of Myoview is relatively low with previously near normal EF at age of 89 Dr. TLovena Leagreed. ?Continue bisoprolol 2.5 mg daily ?K runs high. Repeat BMET today. May consider losartan but would likely require concomitant use of veltassa or similar. Would greatly appreciate clinical pharmacist assistance if this is the case.  ? Also discussed at length with the son worried with her age and overall frailty, polypharmacy is just as likely to cause complications as to improve her overall functional status. Her overall deconditioning is likely multifactorial and addressing any one issue is unlikely  to improve her overall status. Encouraged him to prepare for this eventuality. He states they have an unsigned DNR form at home, but that "she would want everything done". ? ?She saw Dr. Lovena Le 04/02/21, with some reports of SOB, planned for a trial of lasix, to continue if helping and come in for labs, stop if not. ? ?Called to come in with an acute rise in RV pacing %, pt/son report increased SOB, otherwise at her baseine ? ?I saw her 08/28/21 ?The patient  is accompanied by her son, who helps/cares for her ?He is worried abut her ongoing functional decline ?Seems to have less energy ?No reports of CP, but notes that intermittently she seems to have a stutter to her breeathing, taking in some quick short breaths now and then. ?They walk on an occasional basis, once around the block and last week or so, once back to the house she seemed to lose her strength and have to catch herself on the car. ?She did not have syncope ?He asks about work-up/treatment for her weakened heart and is concerned that she is pacing more now. ?With reports of on going functional decline, planned for labs, her BP on the lower side and her BB reduced ?Planned to have her back in a couple months ? ?Labs looked OK ? ?TODAY ?BP is better ?Son is very attentive and involved in her care. ?He worries about her, sees her aging, some decline in function and advancing dementia ?Intermittently he observes her to seem to "grunt" breathe funny ?No syncope ?She very much enjoys going to church still, loves the music. ?Overall unable to walk for as far or as long, but he continues to encourage her to stay active and engaged. ?The patient denies any concerns, symptoms. ? ?Device information:  ?SJM PPM implanted 11/19/15, Dr. Lovena Le, SB, CHB, (ILR was removed) ? ? ?Past Medical History:  ?Diagnosis Date  ? Asthma   ? DDD (degenerative disc disease), lumbar   ? Dementia   ? very mild   ? Depression   ? Disc disorder of cervical region   ? Diverticulosis   ? Hyperlipidemia   ? Hypertension   ? Hyponatremia 12/17/2011  ? Kidney stones   ? "passed them"  ? Memory loss   ? Mitral valve prolapse   ? Osteopenia   ? Pneumonia   ? "I believe I had it a long time ago"  ? Presence of permanent cardiac pacemaker   ? Syncope   ? UTI (lower urinary tract infection)   ? Vaginal prolapse   ? Venous insufficiency   ? ? ?Past Surgical History:  ?Procedure Laterality Date  ? ABDOMINAL HYSTERECTOMY    ? APPENDECTOMY    ? EP  IMPLANTABLE DEVICE N/A 08/27/2015  ? Procedure: Loop Recorder Insertion;  Surgeon: Evans Lance, MD;  Location: [facility];  Service: Cardiovascular;  Laterality: N/A;  ? EP IMPLANTABLE DEVICE N/A 11/19/2015  ? Procedure: Pacemaker Implant;  Surgeon: Evans Lance, MD;  Location: [facility];  Service: Cardiovascular;  Laterality: N/A;  ? INSERT / REPLACE / REMOVE PACEMAKER  11/19/2015  ? TONSILLECTOMY    ? ? ?Current Outpatient Medications  ?Medication Sig Dispense Refill  ? acetaminophen (TYLENOL) 500 MG tablet Take 2 tablets (1,000 mg total) by mouth 4 (four) times daily. 30 tablet 0  ? bisoprolol (ZEBETA) 5 MG tablet Take 0.5 tablets (2.5 mg total) by mouth at bedtime. 45 tablet 3  ? Cholecalciferol (VITAMIN D3) 25  MCG (1000 UT) CAPS Take 1 capsule by mouth daily.    ? dronabinol (MARINOL) 2.5 MG capsule Take 2.5 mg by mouth daily as needed (appetite).    ? feeding supplement (ENSURE ENLIVE / ENSURE PLUS) LIQD Take 237 mLs by mouth 3 (three) times daily between meals. 237 mL 12  ? ibuprofen (ADVIL) 200 MG tablet Take 200 mg by mouth every 6 (six) hours as needed for moderate pain.    ? methimazole (TAPAZOLE) 10 MG tablet Take 5 mg by mouth daily.    ? ondansetron (ZOFRAN) 8 MG tablet 1 tablet as needed    ? pantoprazole (PROTONIX) 40 MG tablet Take 1 tablet (40 mg total) by mouth daily before supper. 30 tablet 0  ? ?No current facility-administered medications for this visit.  ? ? ?Allergies:   Carvedilol, Sulfa antibiotics, and Sulfamethoxazole  ? ?Social History:  The patient  reports that she has never smoked. She has never used smokeless tobacco. She reports that she does not drink alcohol and does not use drugs.  ? ?Family History:  The patient's family history includes Cancer in her sister. ? ?ROS:  Please see the history of present illness.  ?All other systems are reviewed and otherwise negative.  ? ?PHYSICAL EXAM:  ?VS:  There were no vitals taken for this visit. BMI: There is no height or  weight on file to calculate BMI. ?Well nourished, though quite thinwell developed, in no acute distress  ?HEENT: normocephalic, atraumatic  ?Neck: no JVD, carotid bruits or masses ?Cardiac: RRR; no signif

## 2021-10-22 ENCOUNTER — Ambulatory Visit (INDEPENDENT_AMBULATORY_CARE_PROVIDER_SITE_OTHER): Payer: Medicare PPO | Admitting: Physician Assistant

## 2021-10-22 ENCOUNTER — Other Ambulatory Visit: Payer: Self-pay

## 2021-10-22 ENCOUNTER — Encounter: Payer: Self-pay | Admitting: Physician Assistant

## 2021-10-22 VITALS — BP 130/76 | HR 82 | Ht 66.0 in | Wt 107.0 lb

## 2021-10-22 DIAGNOSIS — Z950 Presence of cardiac pacemaker: Secondary | ICD-10-CM

## 2021-10-22 DIAGNOSIS — I480 Paroxysmal atrial fibrillation: Secondary | ICD-10-CM | POA: Diagnosis not present

## 2021-10-22 DIAGNOSIS — I10 Essential (primary) hypertension: Secondary | ICD-10-CM

## 2021-10-22 DIAGNOSIS — I5022 Chronic systolic (congestive) heart failure: Secondary | ICD-10-CM

## 2021-10-22 DIAGNOSIS — I428 Other cardiomyopathies: Secondary | ICD-10-CM

## 2021-10-22 LAB — CUP PACEART INCLINIC DEVICE CHECK
Battery Remaining Longevity: 58 mo
Battery Voltage: 2.98 V
Brady Statistic RA Percent Paced: 10 %
Brady Statistic RV Percent Paced: 96 %
Date Time Interrogation Session: 20230321173143
Implantable Lead Implant Date: 20170417
Implantable Lead Implant Date: 20170417
Implantable Lead Location: 753859
Implantable Lead Location: 753860
Implantable Pulse Generator Implant Date: 20170417
Lead Channel Impedance Value: 412.5 Ohm
Lead Channel Impedance Value: 450 Ohm
Lead Channel Pacing Threshold Amplitude: 0.5 V
Lead Channel Pacing Threshold Amplitude: 0.625 V
Lead Channel Pacing Threshold Pulse Width: 0.5 ms
Lead Channel Pacing Threshold Pulse Width: 0.5 ms
Lead Channel Sensing Intrinsic Amplitude: 0.9 mV
Lead Channel Sensing Intrinsic Amplitude: 11.9 mV
Lead Channel Setting Pacing Amplitude: 0.875
Lead Channel Setting Pacing Amplitude: 1.5 V
Lead Channel Setting Pacing Pulse Width: 0.5 ms
Lead Channel Setting Sensing Sensitivity: 2 mV
Pulse Gen Model: 2272
Pulse Gen Serial Number: 7883162

## 2021-10-22 NOTE — Patient Instructions (Signed)
Medication Instructions:  ? ?Your physician recommends that you continue on your current medications as directed. Please refer to the Current Medication list given to you today. ? ? ?*If you need a refill on your cardiac medications before your next appointment, please call your pharmacy* ? ? ?Lab Work:NONE ORDERED  TODAY ? ? ? ? ?If you have labs (blood work) drawn today and your tests are completely normal, you will receive your results only by: ?MyChart Message (if you have MyChart) OR ?A paper copy in the mail ?If you have any lab test that is abnormal or we need to change your treatment, we will call you to review the results. ? ? ?Testing/Procedures: NONE ORDERED  TODAY ? ? ?Follow-Up: ?At [facility], you and your health needs are our priority.  As part of our continuing mission to provide you with exceptional heart care, we have created designated Provider Care Teams.  These Care Teams include your primary Cardiologist (physician) and Advanced Practice Providers (APPs -  Physician Assistants and Nurse Practitioners) who all work together to provide you with the care you need, when you need it. ? ?We recommend signing up for the patient portal called "MyChart".  Sign up information is provided on this After Visit Summary.  MyChart is used to connect with patients for Virtual Visits (Telemedicine).  Patients are able to view lab/test results, encounter notes, upcoming appointments, etc.  Non-urgent messages can be sent to your provider as well.   ?To learn more about what you can do with MyChart, go to [email or web address].   ? ?Your next appointment:   ?4 month(s) ? ?The format for your next appointment:   ?In Person ? ?Provider:   ?You may see  Tommye Standard, PA-C ? ? ?1}  ? ?Other Instructions ? ?

## 2021-11-08 ENCOUNTER — Ambulatory Visit (INDEPENDENT_AMBULATORY_CARE_PROVIDER_SITE_OTHER): Payer: Medicare PPO

## 2021-11-08 DIAGNOSIS — R55 Syncope and collapse: Secondary | ICD-10-CM

## 2021-11-11 LAB — CUP PACEART REMOTE DEVICE CHECK
Battery Remaining Longevity: 54 mo
Battery Remaining Percentage: 45 %
Battery Voltage: 2.98 V
Brady Statistic AP VP Percent: 22 %
Brady Statistic AP VS Percent: 1 %
Brady Statistic AS VP Percent: 74 %
Brady Statistic AS VS Percent: 1.8 %
Brady Statistic RA Percent Paced: 19 %
Brady Statistic RV Percent Paced: 96 %
Date Time Interrogation Session: 20230407020015
Implantable Lead Implant Date: 20170417
Implantable Lead Implant Date: 20170417
Implantable Lead Location: 753859
Implantable Lead Location: 753860
Implantable Pulse Generator Implant Date: 20170417
Lead Channel Impedance Value: 410 Ohm
Lead Channel Impedance Value: 450 Ohm
Lead Channel Pacing Threshold Amplitude: 0.5 V
Lead Channel Pacing Threshold Amplitude: 0.625 V
Lead Channel Pacing Threshold Pulse Width: 0.5 ms
Lead Channel Pacing Threshold Pulse Width: 0.5 ms
Lead Channel Sensing Intrinsic Amplitude: 1.1 mV
Lead Channel Sensing Intrinsic Amplitude: 12 mV
Lead Channel Setting Pacing Amplitude: 0.875
Lead Channel Setting Pacing Amplitude: 1.5 V
Lead Channel Setting Pacing Pulse Width: 0.5 ms
Lead Channel Setting Sensing Sensitivity: 2 mV
Pulse Gen Model: 2272
Pulse Gen Serial Number: 7883162

## 2021-11-12 DIAGNOSIS — E0500 Thyrotoxicosis with diffuse goiter without thyrotoxic crisis or storm: Secondary | ICD-10-CM | POA: Diagnosis not present

## 2021-11-12 DIAGNOSIS — D649 Anemia, unspecified: Secondary | ICD-10-CM | POA: Diagnosis not present

## 2021-11-12 DIAGNOSIS — Z87440 Personal history of urinary (tract) infections: Secondary | ICD-10-CM | POA: Diagnosis not present

## 2021-11-12 DIAGNOSIS — E0590 Thyrotoxicosis, unspecified without thyrotoxic crisis or storm: Secondary | ICD-10-CM | POA: Diagnosis not present

## 2021-11-25 NOTE — Progress Notes (Signed)
Remote pacemaker transmission.   

## 2021-11-26 DIAGNOSIS — G301 Alzheimer's disease with late onset: Secondary | ICD-10-CM | POA: Diagnosis not present

## 2021-11-26 DIAGNOSIS — Z1331 Encounter for screening for depression: Secondary | ICD-10-CM | POA: Diagnosis not present

## 2021-11-26 DIAGNOSIS — I5022 Chronic systolic (congestive) heart failure: Secondary | ICD-10-CM | POA: Diagnosis not present

## 2021-11-26 DIAGNOSIS — R110 Nausea: Secondary | ICD-10-CM | POA: Diagnosis not present

## 2021-11-26 DIAGNOSIS — Z0000 Encounter for general adult medical examination without abnormal findings: Secondary | ICD-10-CM | POA: Diagnosis not present

## 2021-11-26 DIAGNOSIS — I10 Essential (primary) hypertension: Secondary | ICD-10-CM | POA: Diagnosis not present

## 2021-11-26 DIAGNOSIS — N390 Urinary tract infection, site not specified: Secondary | ICD-10-CM | POA: Diagnosis not present

## 2021-11-26 DIAGNOSIS — F0280 Dementia in other diseases classified elsewhere without behavioral disturbance: Secondary | ICD-10-CM | POA: Diagnosis not present

## 2021-11-26 DIAGNOSIS — R300 Dysuria: Secondary | ICD-10-CM | POA: Diagnosis not present

## 2022-01-03 DIAGNOSIS — Z7189 Other specified counseling: Secondary | ICD-10-CM | POA: Diagnosis not present

## 2022-01-03 DIAGNOSIS — R609 Edema, unspecified: Secondary | ICD-10-CM | POA: Diagnosis not present

## 2022-01-03 DIAGNOSIS — R5383 Other fatigue: Secondary | ICD-10-CM | POA: Diagnosis not present

## 2022-01-03 DIAGNOSIS — R410 Disorientation, unspecified: Secondary | ICD-10-CM | POA: Diagnosis not present

## 2022-01-21 DIAGNOSIS — N3090 Cystitis, unspecified without hematuria: Secondary | ICD-10-CM | POA: Diagnosis not present

## 2022-02-07 ENCOUNTER — Ambulatory Visit (INDEPENDENT_AMBULATORY_CARE_PROVIDER_SITE_OTHER): Payer: Medicare PPO

## 2022-02-07 DIAGNOSIS — I428 Other cardiomyopathies: Secondary | ICD-10-CM

## 2022-02-08 LAB — CUP PACEART REMOTE DEVICE CHECK
Battery Remaining Longevity: 52 mo
Battery Remaining Percentage: 43 %
Battery Voltage: 2.98 V
Brady Statistic AP VP Percent: 14 %
Brady Statistic AP VS Percent: 1 %
Brady Statistic AS VP Percent: 83 %
Brady Statistic AS VS Percent: 1.5 %
Brady Statistic RA Percent Paced: 12 %
Brady Statistic RV Percent Paced: 97 %
Date Time Interrogation Session: 20230707020015
Implantable Lead Implant Date: 20170417
Implantable Lead Implant Date: 20170417
Implantable Lead Location: 753859
Implantable Lead Location: 753860
Implantable Pulse Generator Implant Date: 20170417
Lead Channel Impedance Value: 400 Ohm
Lead Channel Impedance Value: 440 Ohm
Lead Channel Pacing Threshold Amplitude: 0.5 V
Lead Channel Pacing Threshold Amplitude: 0.625 V
Lead Channel Pacing Threshold Pulse Width: 0.5 ms
Lead Channel Pacing Threshold Pulse Width: 0.5 ms
Lead Channel Sensing Intrinsic Amplitude: 0.8 mV
Lead Channel Sensing Intrinsic Amplitude: 12 mV
Lead Channel Setting Pacing Amplitude: 0.875
Lead Channel Setting Pacing Amplitude: 1.5 V
Lead Channel Setting Pacing Pulse Width: 0.5 ms
Lead Channel Setting Sensing Sensitivity: 2 mV
Pulse Gen Model: 2272
Pulse Gen Serial Number: 7883162

## 2022-02-16 DIAGNOSIS — D1723 Benign lipomatous neoplasm of skin and subcutaneous tissue of right leg: Secondary | ICD-10-CM | POA: Diagnosis not present

## 2022-02-18 DIAGNOSIS — G309 Alzheimer's disease, unspecified: Secondary | ICD-10-CM | POA: Diagnosis not present

## 2022-02-18 DIAGNOSIS — K219 Gastro-esophageal reflux disease without esophagitis: Secondary | ICD-10-CM | POA: Diagnosis not present

## 2022-02-18 DIAGNOSIS — R32 Unspecified urinary incontinence: Secondary | ICD-10-CM | POA: Diagnosis not present

## 2022-02-18 DIAGNOSIS — E0590 Thyrotoxicosis, unspecified without thyrotoxic crisis or storm: Secondary | ICD-10-CM | POA: Diagnosis not present

## 2022-02-18 DIAGNOSIS — F0150 Vascular dementia without behavioral disturbance: Secondary | ICD-10-CM | POA: Diagnosis not present

## 2022-02-18 DIAGNOSIS — Z833 Family history of diabetes mellitus: Secondary | ICD-10-CM | POA: Diagnosis not present

## 2022-02-18 DIAGNOSIS — R112 Nausea with vomiting, unspecified: Secondary | ICD-10-CM | POA: Diagnosis not present

## 2022-02-18 DIAGNOSIS — M2550 Pain in unspecified joint: Secondary | ICD-10-CM | POA: Diagnosis not present

## 2022-02-18 DIAGNOSIS — I10 Essential (primary) hypertension: Secondary | ICD-10-CM | POA: Diagnosis not present

## 2022-02-23 NOTE — Progress Notes (Unsigned)
Cardiology Office Note Date:  02/23/2022  Patient ID:  Gabrielle Hoffman, Gabrielle Hoffman [birth year], MRN [id number] PCP:  Lajean Manes, MD  Cardiologist/Electrophysiologist:  Dr. Lovena Le   Chief Complaint:    planned f/u   History of Present Illness: Gabrielle Hoffman is a 88 y.o. female with history of syncope and sinus pauses, s/p PPM implant April 2017, HLD, HTN, mild memory loss, dementia, LV dysfunction (EF 45-50%), chronic LBBB, CM (likely NICM), AF (SCAF)   I saw her 09/12/20 She comes accompanied by her son. She reports she feels OK Denies to  Me any SOB, or coughing (her son reminds me of her dementia) She denies any CP, palpitations or cardiac awareness. No reports of dizziness, near syncope or syncope. Her son tells me that her cough is perhaps better but continues. He had noted more frequent urination with the addition of lasix. He notes it as a dry, nonproductive cough, most often noted in the evenings after dinner and her night meds. He has also noted no overt SOB but some ties seems to have a stutter to her breath, or a few quick inhales now and then. He noted that it started about 75moago after a hospital stay with a broken hip that apparently was complicated with what sounds like terrible nausea and some degree of bowel obstruction.  She remains with some intermittent constipation issues and occasional nausea. Follows with [facility]  The patient denies any belly pain or tenderness, eating well.  Strong suspicion her cough was GERD and advised raining HOB and not going to bed so soon after dinner, and also to f/u with her PMD and/or GI MD She did not appear volume OL   I saw her 11/20/20 She is again with her son today They are in a bit of a hurry with an appt with Dr. SFelipa Ethtoday as well. The patient denies any particular concerns, denies SOB Her son though add that the nighttime cough after dinner particularly continued Stopped the lasix because it did not seem to  help. He tried to have her stay up longer after dinner but is pretty insistent on getting into bed shortly afterwards.  It is an dry irritative sounding cough No SOB, no symptoms of PND or orthopnea are reported They have not seen GI but will see Dr. SFelipa Ethtoday. He mentions today that they walk outside fairly regularly and some days does quite well, and other days she has to stop ever few feet feeling winded. She denies any kind of CP, palpitations or cardiac awareness No reports of dizziness, near syncope or syncope. Planned for CXR and echo, recommended as well to discuss symptoms w/PMD  CXR w/NAP Echo noted LVEF reduced to 25-30% and recommended to come in for further discussion and management  She saw A. TChalmers Cater PA-C 01/23/21, son was concerned about declining functional status, also apparently found to have reactive airway disease He discussed that Pre-test probability of Myoview is relatively low with previously near normal EF at age of 89 Dr. TLovena Leagreed. Continue bisoprolol 2.5 mg daily K runs high. Repeat BMET today. May consider losartan but would likely require concomitant use of veltassa or similar. Would greatly appreciate clinical pharmacist assistance if this is the case.   Also discussed at length with the son worried with her age and overall frailty, polypharmacy is just as likely to cause complications as to improve her overall functional status. Her overall deconditioning is likely multifactorial and addressing any one issue is  unlikely to improve her overall status. Encouraged him to prepare for this eventuality. He states they have an unsigned DNR form at home, but that "she would want everything done".  She saw Dr. Lovena Le 04/02/21, with some reports of SOB, planned for a trial of lasix, to continue if helping and come in for labs, stop if not.  Called to come in with an acute rise in RV pacing %, pt/son report increased SOB, otherwise at her baseine  I saw her  08/28/21 The patient is accompanied by her son, who helps/cares for her He is worried abut her ongoing functional decline Seems to have less energy No reports of CP, but notes that intermittently she seems to have a stutter to her breathing, taking in some quick short breaths now and then. They walk on an occasional basis, once around the block and last week or so, once back to the house she seemed to lose her strength and have to catch herself on the car. She did not have syncope He asks about work-up/treatment for her weakened heart and is concerned that she is pacing more now. With reports of on going functional decline, planned for labs, her BP on the lower side and her BB reduced Planned to have her back in a couple months  Labs looked OK  I saw her 10/22/21 BP is better Son is very attentive and involved in her care. He worries about her, sees her aging, some decline in function and advancing dementia Intermittently he observes her to seem to "grunt" breathe funny No syncope She very much enjoys going to church still, loves the music. Overall unable to walk for as far or as long, but he continues to encourage her to stay active and engaged. The patient denies any concerns, symptoms.  AF burden <1%, no HVR, paced at 42 Suspected observed breathing pattern by her son was perhaps 2/2 slouched seated position, mechanical, Planned to follow device/AF burden no a/c at that juncture.  TODAY The patient is with her son All in all about the same, he thinks she continues to slow some. He has added on worries about new health issues of his own now as well. The patient denies any symptoms or concerns Her son outside of some trend towards slowing down, nothing new. No reports of syncope, CP  Device information:  SJM PPM implanted 11/19/15, Dr. Lovena Le, SB, CHB, (ILR was removed)   Past Medical History:  Diagnosis Date   Asthma    DDD (degenerative disc disease), lumbar    Dementia     very mild    Depression    Disc disorder of cervical region    Diverticulosis    Hyperlipidemia    Hypertension    Hyponatremia 12/17/2011   Kidney stones    "passed them"   Memory loss    Mitral valve prolapse    Osteopenia    Pneumonia    "I believe I had it a long time ago"   Presence of permanent cardiac pacemaker    Syncope    UTI (lower urinary tract infection)    Vaginal prolapse    Venous insufficiency     Past Surgical History:  Procedure Laterality Date   ABDOMINAL HYSTERECTOMY     APPENDECTOMY     EP IMPLANTABLE DEVICE N/A 08/27/2015   Procedure: Loop Recorder Insertion;  Surgeon: Evans Lance, MD;  Location: [facility];  Service: Cardiovascular;  Laterality: N/A;   EP IMPLANTABLE DEVICE N/A 11/19/2015  Procedure: Pacemaker Implant;  Surgeon: Evans Lance, MD;  Location: [facility];  Service: Cardiovascular;  Laterality: N/A;   INSERT / REPLACE / REMOVE PACEMAKER  11/19/2015   TONSILLECTOMY      Current Outpatient Medications  Medication Sig Dispense Refill   acetaminophen (TYLENOL) 500 MG tablet Take 2 tablets (1,000 mg total) by mouth 4 (four) times daily. (Patient not taking: Reported on 10/22/2021) 30 tablet 0   bisoprolol (ZEBETA) 5 MG tablet Take 0.5 tablets (2.5 mg total) by mouth at bedtime. 45 tablet 3   Cholecalciferol (VITAMIN D3) 25 MCG (1000 UT) CAPS Take 1 capsule by mouth daily. (Patient not taking: Reported on 10/22/2021)     donepezil (ARICEPT) 5 MG tablet Take 2.5 mg by mouth at bedtime.     dronabinol (MARINOL) 2.5 MG capsule Take 2.5 mg by mouth daily as needed (appetite).     feeding supplement (ENSURE ENLIVE / ENSURE PLUS) LIQD Take 237 mLs by mouth 3 (three) times daily between meals. (Patient not taking: Reported on 10/22/2021) 237 mL 12   ibuprofen (ADVIL) 200 MG tablet Take 200 mg by mouth every 6 (six) hours as needed for moderate pain. (Patient not taking: Reported on 10/22/2021)     methimazole (TAPAZOLE) 10 MG tablet Take  5 mg by mouth daily.     ondansetron (ZOFRAN) 8 MG tablet 1 tablet as needed     pantoprazole (PROTONIX) 40 MG tablet Take 1 tablet (40 mg total) by mouth daily before supper. 30 tablet 0   No current facility-administered medications for this visit.    Allergies:   Carvedilol, Sulfa antibiotics, and Sulfamethoxazole   Social History:  The patient  reports that she has never smoked. She has never used smokeless tobacco. She reports that she does not drink alcohol and does not use drugs.   Family History:  The patient's family history includes Cancer in her sister.  ROS:  Please see the history of present illness.  All other systems are reviewed and otherwise negative.   PHYSICAL EXAM:  VS:  There were no vitals taken for this visit. BMI: There is no height or weight on file to calculate BMI. Well nourished, though quite thinwell developed, in no acute distress  HEENT: normocephalic, atraumatic  Neck: no JVD, carotid bruits or masses Cardiac: RRR; no significant murmurs, no rubs, or gallops Lungs:  CTA b/l, no wheezing, rhonchi or rales  Abd: soft, nontender MS: no deformity, age appropriate, perhaps advanced atrophy Ext:  no edema  Skin: warm and dry, no rash Neuro:  No gross deficits appreciated Psych: euthymic mood, full affect  PPM site is stable, no tethering or discomfort    EKG:  not done today  PPM  interrogation today: reviewed by myself: Battery and lead measurements are good 3 AMS, longest 18 seconds   12/26/20: TTE IMPRESSIONS   1. Left ventricular ejection fraction, by estimation, is 25 to 30%. The  left ventricle has severely decreased function. The left ventricle  demonstrates global hypokinesis. There is moderate left ventricular  hypertrophy of the basal-septal segment. Left  ventricular diastolic parameters are consistent with Grade I diastolic  dysfunction (impaired relaxation).   2. Right ventricular systolic function is normal. The right ventricular   size is normal.   3. Left atrial size was moderately dilated.   4. The mitral valve is normal in structure. No evidence of mitral valve  regurgitation. No evidence of mitral stenosis.   5. The aortic valve is normal  in structure. Aortic valve regurgitation is  mild. No aortic stenosis is present.   6. The inferior vena cava is normal in size with greater than 50%  respiratory variability, suggesting right atrial pressure of 3 mmHg.    07/26/15: Echocardiogram Study Conclusions  - Left ventricle: The cavity size was normal. There was mild focal   basal hypertrophy of the septum. Systolic function was mildly   reduced, primarily due to systolic dyssynchrony. The estimated   ejection fraction was in the range of 45% to 50%. Wall motion was   normal; there were no regional wall motion abnormalities. There   was fusion of early and atrial contributions to ventricular   filling. The study is not technically sufficient to allow   evaluation of LV diastolic function. - Ventricular septum: Septal motion showed abnormal function,   dyssynergy, and paradox. These changes are consistent with a left   bundle branch block. - Aortic valve: There was trivial regurgitation. - Mitral valve: Calcified annulus.    Recent Labs: 08/28/2021: BUN 12; Creatinine, Ser 0.83; Hemoglobin 11.3; Platelets 357; Potassium 4.9; Sodium 135  No results found for requested labs within last 365 days.   CrCl cannot be calculated (Patient's most recent lab result is older than the maximum 21 days allowed.).   Wt Readings from Last 3 Encounters:  10/22/21 107 lb (48.5 kg)  08/28/21 104 lb (47.2 kg)  04/02/21 106 lb 9.6 oz (48.4 kg)     Other studies reviewed: Additional studies/records reviewed today include: summarized above  ASSESSMENT AND PLAN:  1. Intermittent CHB, s/p PPM     Intact function, no programming changes made     AP 11 %     VP 97%  2. HTN     Looks ok, would allow some higher numbers for  her  3. Mild CM,  EF 45-50% by her echo in 2016 >> May 2022 25-30% Prior note discuss d/w Dr. Lovena Le, with low yield in inschemic w/u Jonni Sanger d/w son as well, this and concerns that addition of drugs/polypharmacy could be problematic for her No plans for ischemic eval She does not appear volume OL by exam No new reports of SOB, I suspect perhaps 2/2 splinted breathing as she sits pretty slouched    4. SCAF Follow burden We have discussed this previously   Disposition:Will have her back in 46mo sooner if needed, follows with PMD regularly, though will need a new MD, apparently DR. Stoneking will be retiring   Current medicines are reviewed at length with the patient today.  The patient did not have any concerns regarding medicines.  SHaywood Lasso PA-C 02/23/2022 4:34 PM     [place] [phone](office)  [phone])

## 2022-02-24 NOTE — Progress Notes (Signed)
Remote pacemaker transmission.   

## 2022-02-25 ENCOUNTER — Ambulatory Visit (INDEPENDENT_AMBULATORY_CARE_PROVIDER_SITE_OTHER): Payer: Medicare PPO | Admitting: Physician Assistant

## 2022-02-25 ENCOUNTER — Encounter: Payer: Self-pay | Admitting: Physician Assistant

## 2022-02-25 VITALS — BP 156/42 | HR 64 | Ht 66.0 in | Wt 114.6 lb

## 2022-02-25 DIAGNOSIS — I480 Paroxysmal atrial fibrillation: Secondary | ICD-10-CM | POA: Diagnosis not present

## 2022-02-25 DIAGNOSIS — I10 Essential (primary) hypertension: Secondary | ICD-10-CM | POA: Diagnosis not present

## 2022-02-25 DIAGNOSIS — Z950 Presence of cardiac pacemaker: Secondary | ICD-10-CM

## 2022-02-25 DIAGNOSIS — I428 Other cardiomyopathies: Secondary | ICD-10-CM | POA: Diagnosis not present

## 2022-02-25 LAB — CUP PACEART INCLINIC DEVICE CHECK
Battery Remaining Longevity: 51 mo
Battery Voltage: 2.98 V
Brady Statistic RA Percent Paced: 11 %
Brady Statistic RV Percent Paced: 97 %
Date Time Interrogation Session: 20230725174812
Implantable Lead Implant Date: 20170417
Implantable Lead Implant Date: 20170417
Implantable Lead Location: 753859
Implantable Lead Location: 753860
Implantable Pulse Generator Implant Date: 20170417
Lead Channel Impedance Value: 412.5 Ohm
Lead Channel Impedance Value: 450 Ohm
Lead Channel Pacing Threshold Amplitude: 0.5 V
Lead Channel Pacing Threshold Amplitude: 0.625 V
Lead Channel Pacing Threshold Pulse Width: 0.5 ms
Lead Channel Pacing Threshold Pulse Width: 0.5 ms
Lead Channel Sensing Intrinsic Amplitude: 0.9 mV
Lead Channel Sensing Intrinsic Amplitude: 12 mV
Lead Channel Setting Pacing Amplitude: 0.875
Lead Channel Setting Pacing Amplitude: 1.5 V
Lead Channel Setting Pacing Pulse Width: 0.5 ms
Lead Channel Setting Sensing Sensitivity: 2 mV
Pulse Gen Model: 2272
Pulse Gen Serial Number: 7883162

## 2022-02-25 MED ORDER — BISOPROLOL FUMARATE 5 MG PO TABS: 2.5000 mg | ORAL_TABLET | Freq: Every day | ORAL | 0 refills | Status: DC

## 2022-02-25 NOTE — Patient Instructions (Addendum)
Medication Instructions:  Your physician recommends that you continue on your current medications as directed. Please refer to the Current Medication list given to you today.  Your Bisoprolol prescription has been refilled and sent to your pharmacy.   Labwork: None ordered.  Testing/Procedures: None ordered.  Follow-Up: Your physician wants you to follow-up in: 6 MONTHS with Tommye Standard, PA-C  Remote monitoring is used to monitor your Pacemaker from home. This monitoring reduces the number of office visits required to check your device to one time per year. It allows [place] to keep an eye on the functioning of your device to ensure it is working properly. You are scheduled for a device check from home on 05/09/22. You may send your transmission at any time that day. If you have a wireless device, the transmission will be sent automatically. After your physician reviews your transmission, you will receive a postcard with your next transmission date.  Any Other Special Instructions Will Be Listed Below (If Applicable).  If you need a refill on your cardiac medications before your next appointment, please call your pharmacy.   Important Information About Sugar

## 2022-04-08 DIAGNOSIS — H52223 Regular astigmatism, bilateral: Secondary | ICD-10-CM | POA: Diagnosis not present

## 2022-04-08 DIAGNOSIS — H524 Presbyopia: Secondary | ICD-10-CM | POA: Diagnosis not present

## 2022-04-08 DIAGNOSIS — Z961 Presence of intraocular lens: Secondary | ICD-10-CM | POA: Diagnosis not present

## 2022-04-08 DIAGNOSIS — Z9849 Cataract extraction status, unspecified eye: Secondary | ICD-10-CM | POA: Diagnosis not present

## 2022-04-08 DIAGNOSIS — H5203 Hypermetropia, bilateral: Secondary | ICD-10-CM | POA: Diagnosis not present

## 2022-04-08 DIAGNOSIS — H401132 Primary open-angle glaucoma, bilateral, moderate stage: Secondary | ICD-10-CM | POA: Diagnosis not present

## 2022-04-08 DIAGNOSIS — H53143 Visual discomfort, bilateral: Secondary | ICD-10-CM | POA: Diagnosis not present

## 2022-04-09 DIAGNOSIS — Z7409 Other reduced mobility: Secondary | ICD-10-CM | POA: Diagnosis not present

## 2022-04-09 DIAGNOSIS — Z23 Encounter for immunization: Secondary | ICD-10-CM | POA: Diagnosis not present

## 2022-04-09 DIAGNOSIS — N390 Urinary tract infection, site not specified: Secondary | ICD-10-CM | POA: Diagnosis not present

## 2022-04-09 DIAGNOSIS — R350 Frequency of micturition: Secondary | ICD-10-CM | POA: Diagnosis not present

## 2022-04-20 IMAGING — CR DG CHEST 2V
2 series · 2 of 2 positions shown · non-contrast
Comparison: Chest x-ray dated June 27, 2020.

CLINICAL DATA: Dry cough for the past 2 days.

EXAM:
CHEST - 2 VIEW

[w chest pa *]
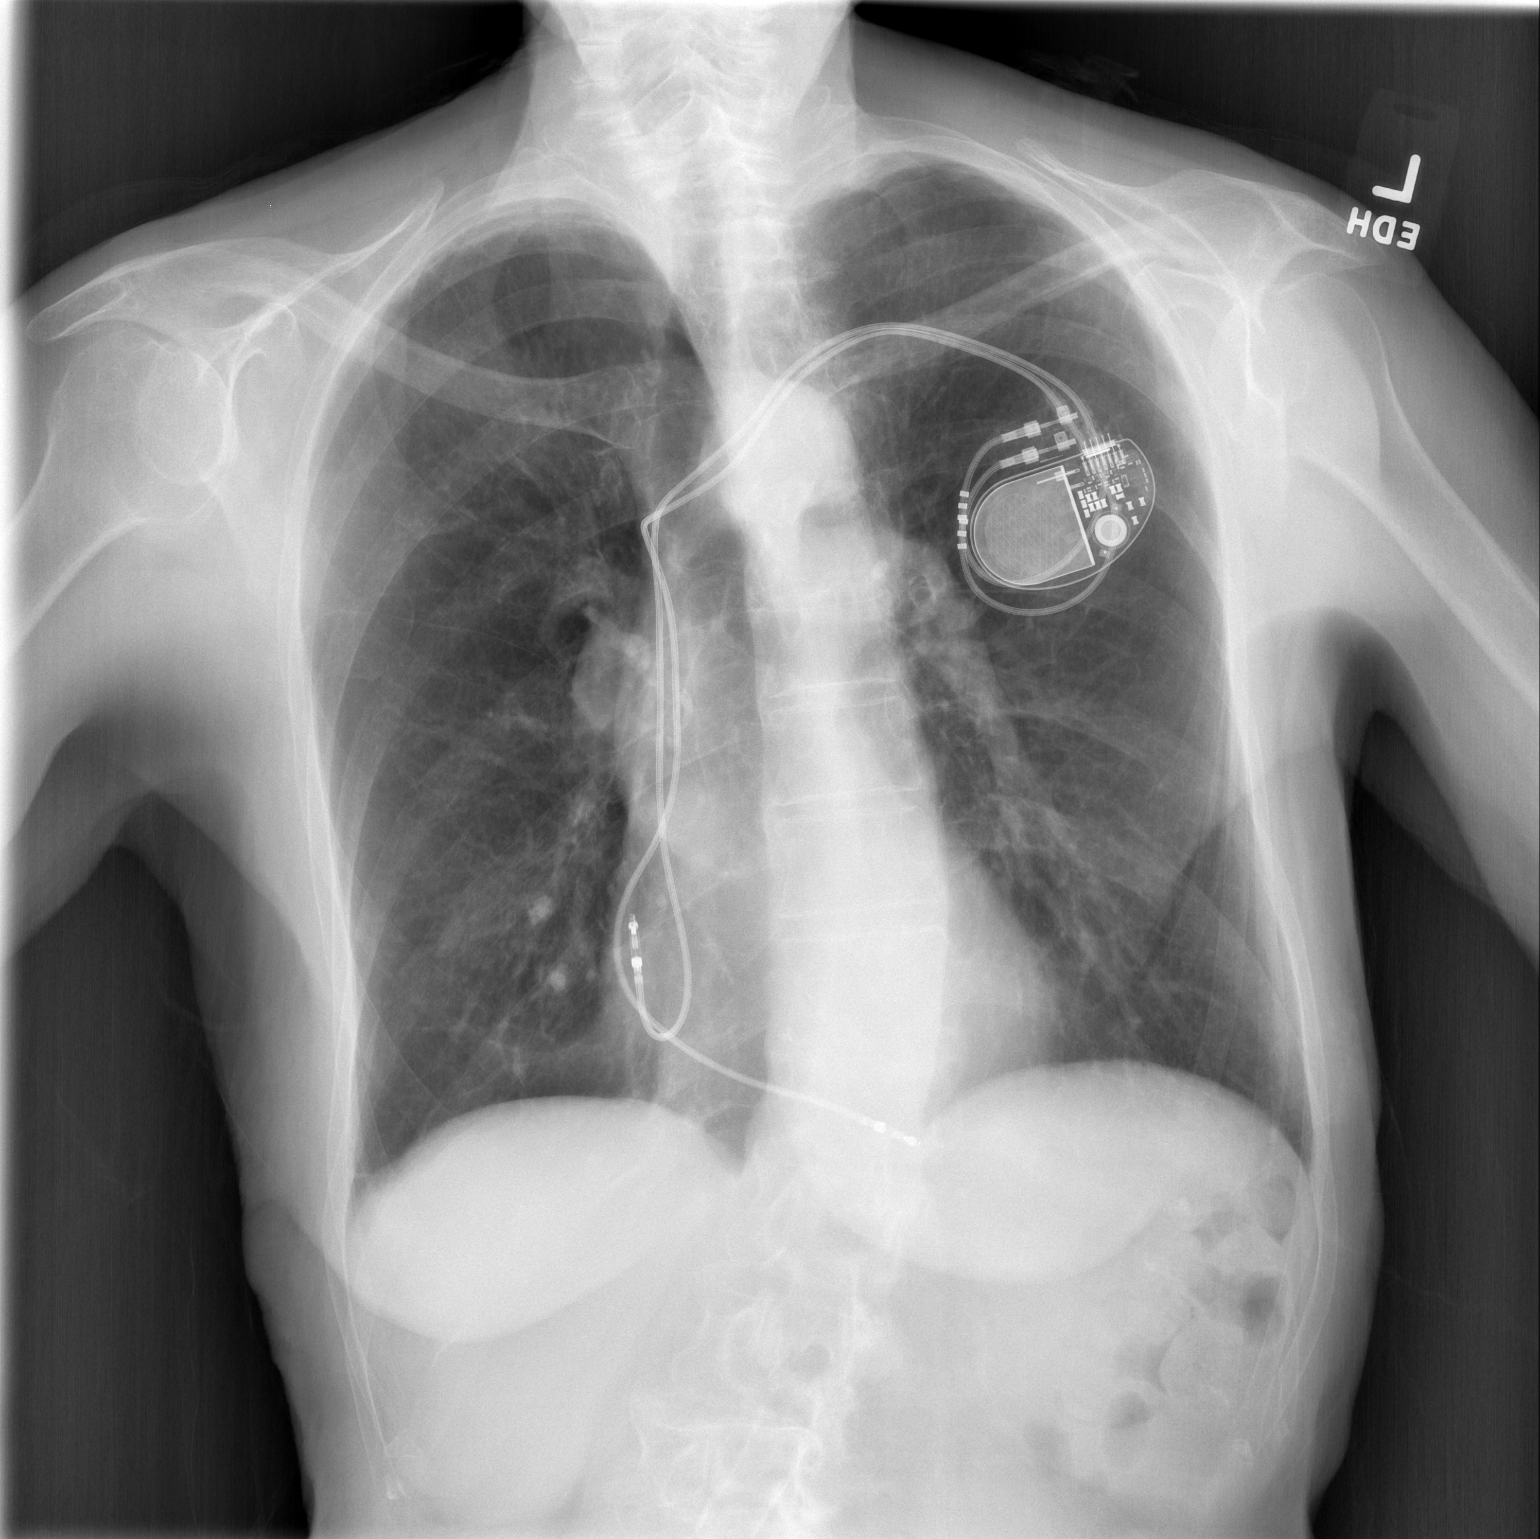

[w chest lat *]
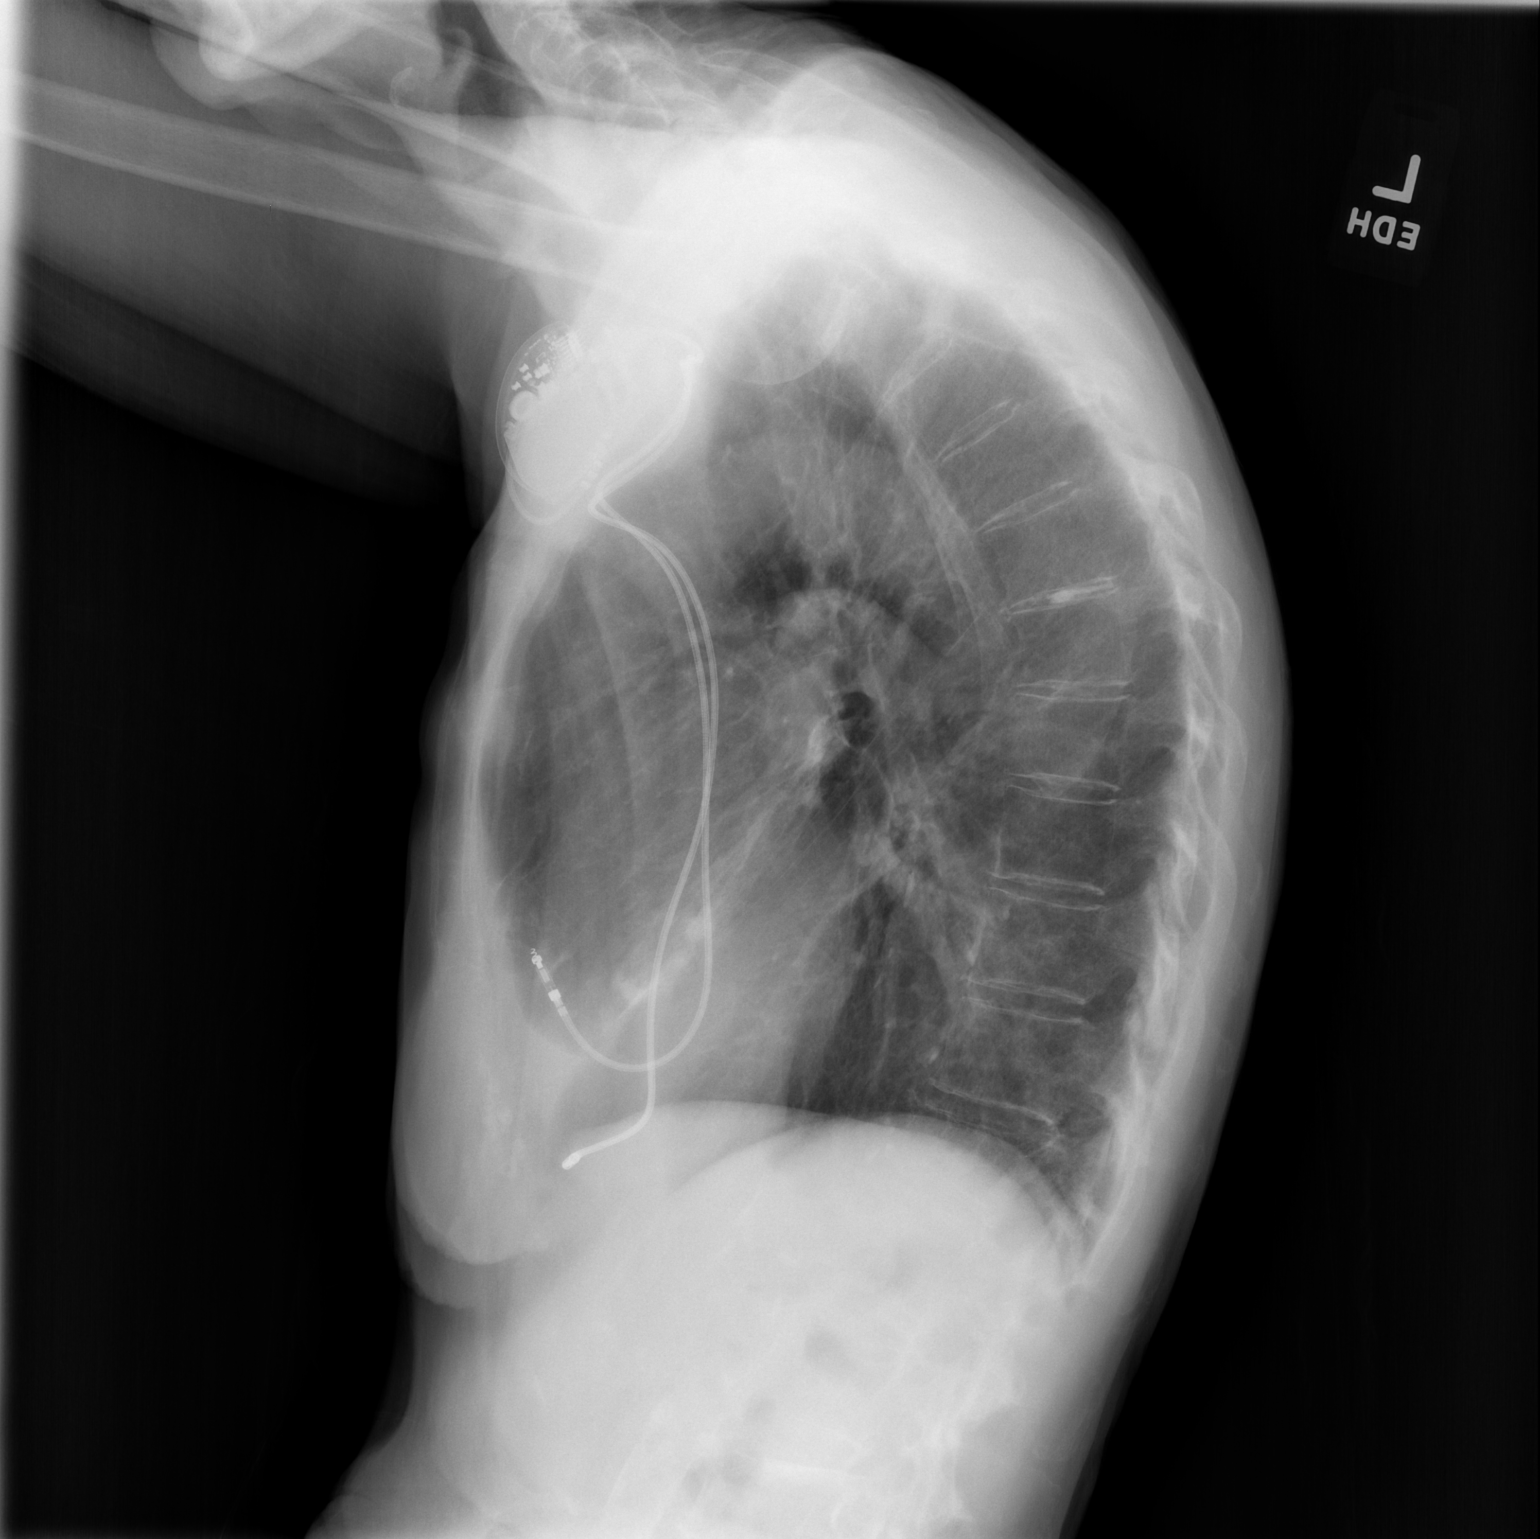

[2 of 2 positions shown; findings below may reference images not displayed]

FINDINGS: The patient is rotated to the right. Unchanged left chest wall
pacemaker. The heart size and mediastinal contours are within normal
limits. Normal pulmonary vascularity. The lungs are hyperinflated.
No focal consolidation, pleural effusion, or pneumothorax. No acute
osseous abnormality.
IMPRESSION: 1. No acute cardiopulmonary disease.

## 2022-04-20 IMAGING — CT CT ABD-PELV W/ CM
2 of 5 series · 13 of 46 positions shown, 15 images · IV contrast (iopamidol)
Comparison: None.

CLINICAL DATA: Nausea.

EXAM:
CT ABDOMEN AND PELVIS WITH CONTRAST
TECHNIQUE: Multidetector CT imaging of the abdomen and pelvis was performed
using the standard protocol following bolus administration of
intravenous contrast.
CONTRAST:  100mL 6743BC-VHH IOPAMIDOL (6743BC-VHH) INJECTION 61%

[Series 2: abd pelvis 5.00 br40 s3 axial · axial · 0.49mm/px · z∈[+1401,+1791]mm · 10 of 88 slices shown, 12 images]
[im 5/88  soft-tissue]
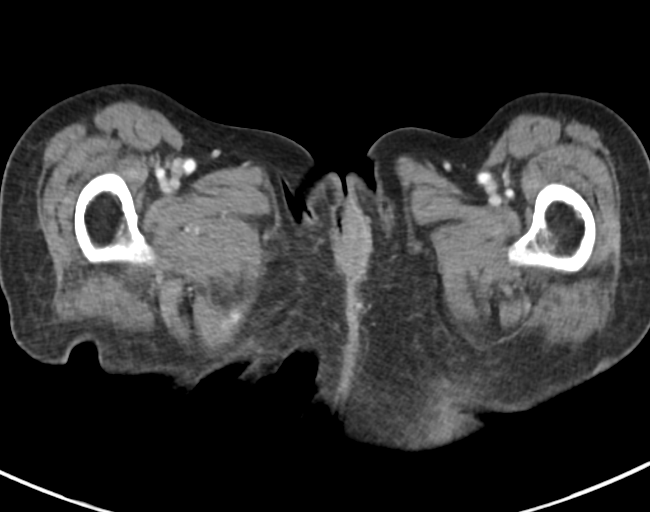
[im 5/88  bone]
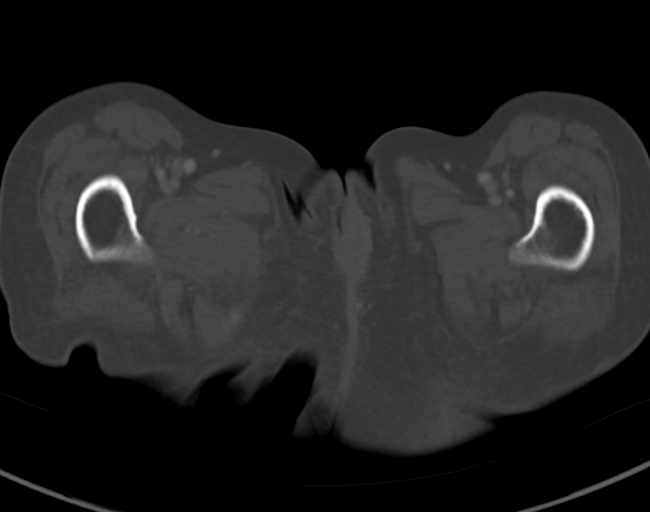
[im 14/88  soft-tissue]
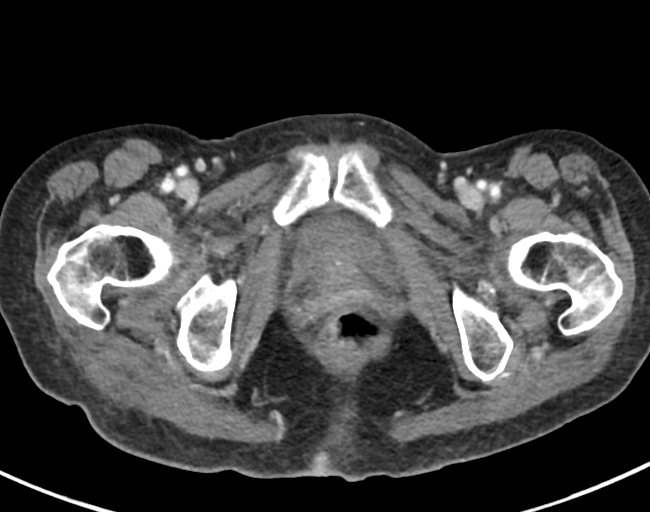
[im 23/88  soft-tissue]
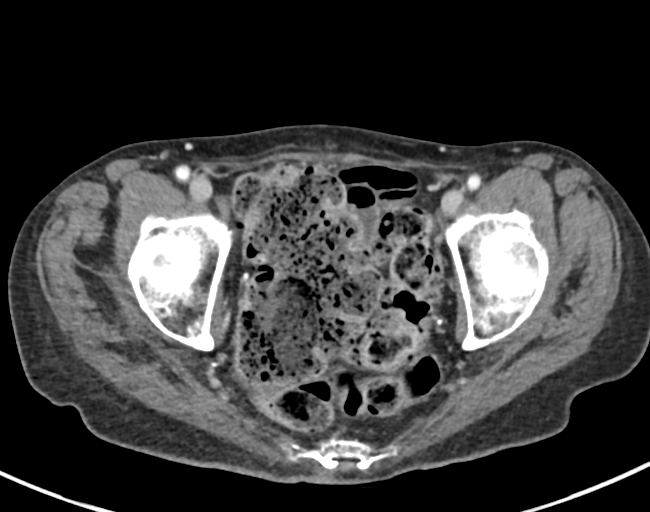
[im 33/88  soft-tissue]
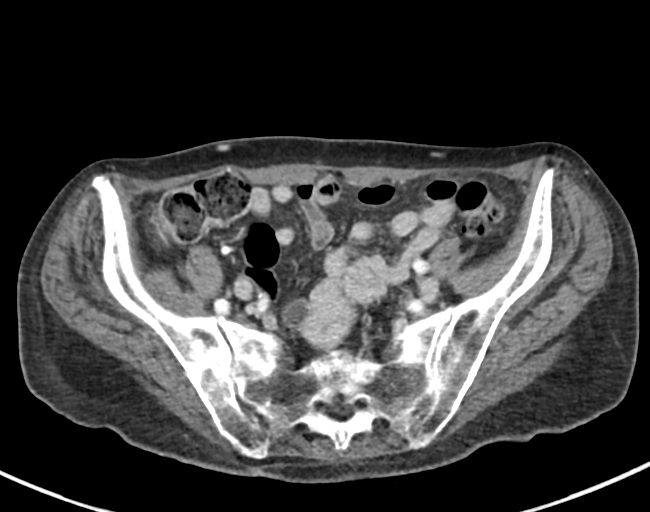
[im 42/88  soft-tissue]
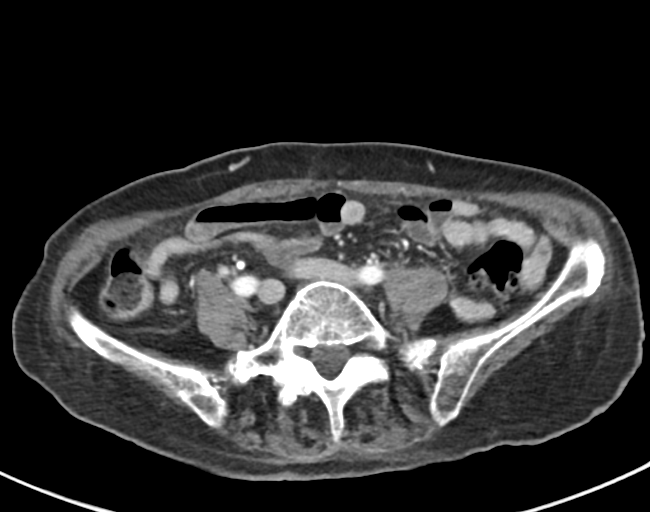
[im 46/88  soft-tissue]
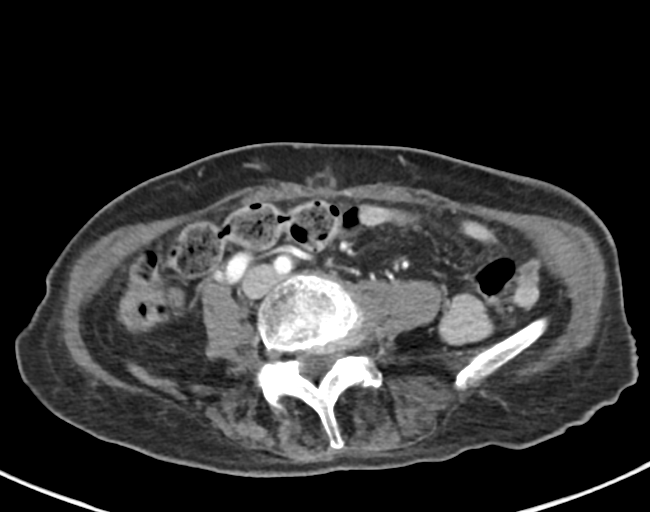
[im 55/88  soft-tissue]
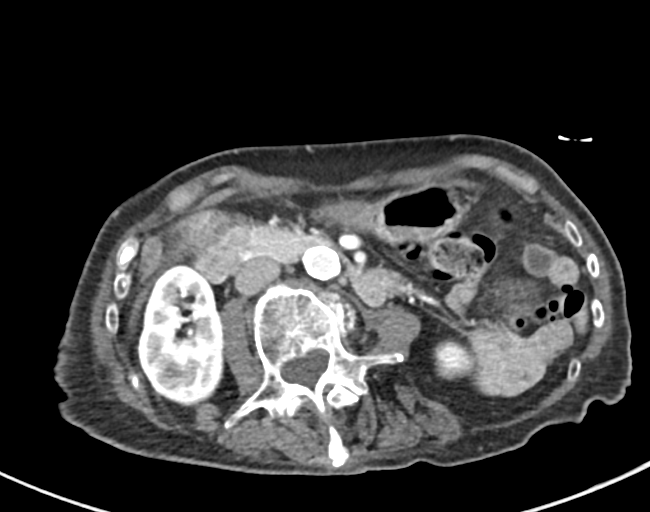
[im 65/88  soft-tissue]
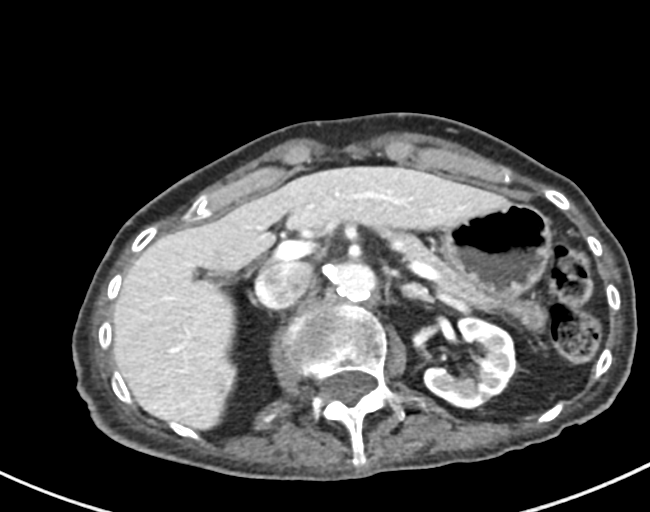
[im 74/88  soft-tissue]
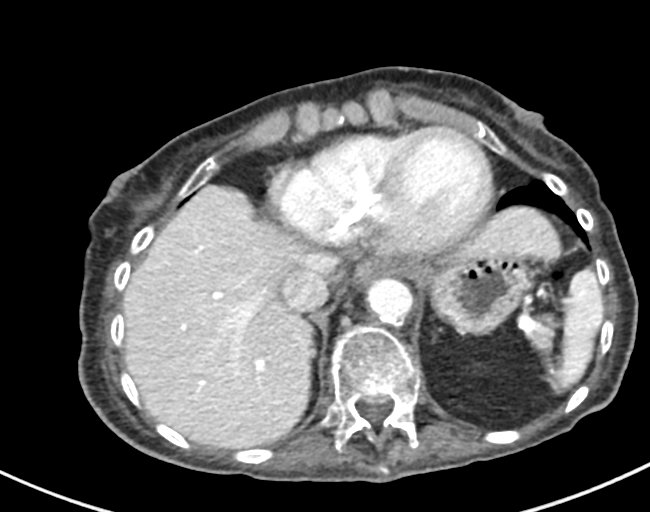
[im 74/88  bone]
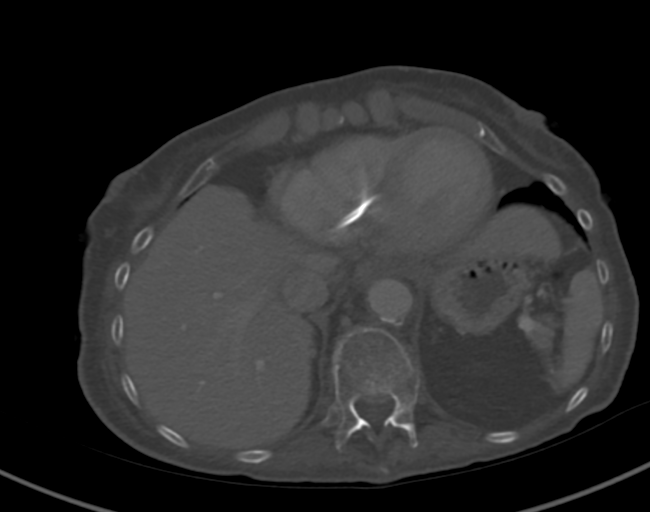
[im 83/88  soft-tissue]
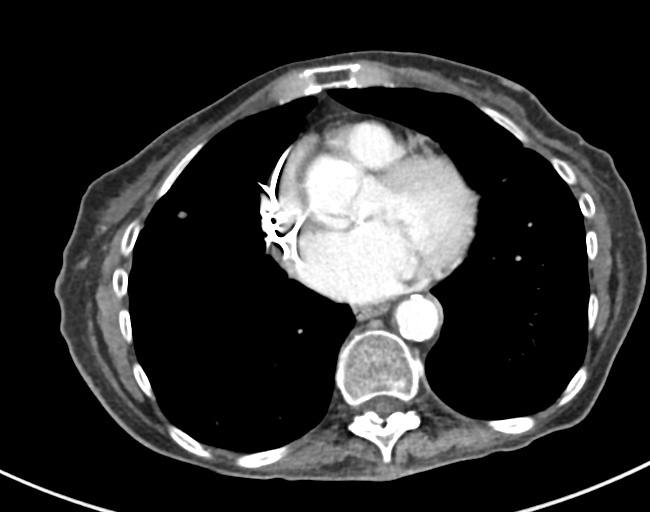

[Series 6: abd pelvis 2.00 br40 s3 cor · coronal · 0.62mm/px · 3 of 124 slices shown]
[im 42/124  soft-tissue]
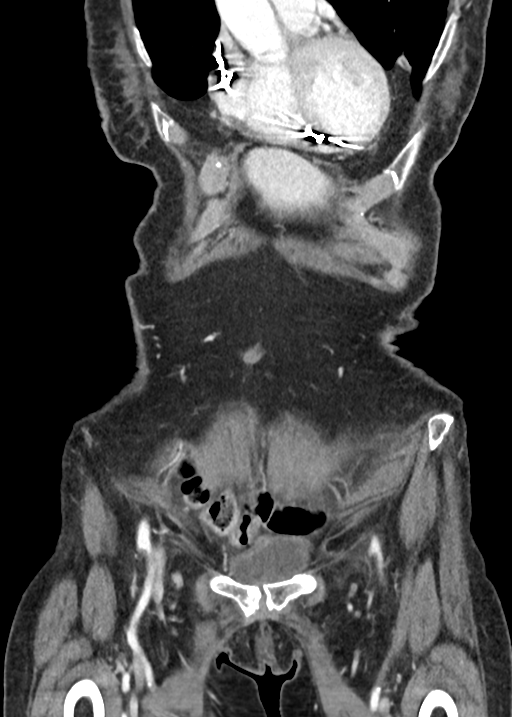
[im 55/124  soft-tissue]
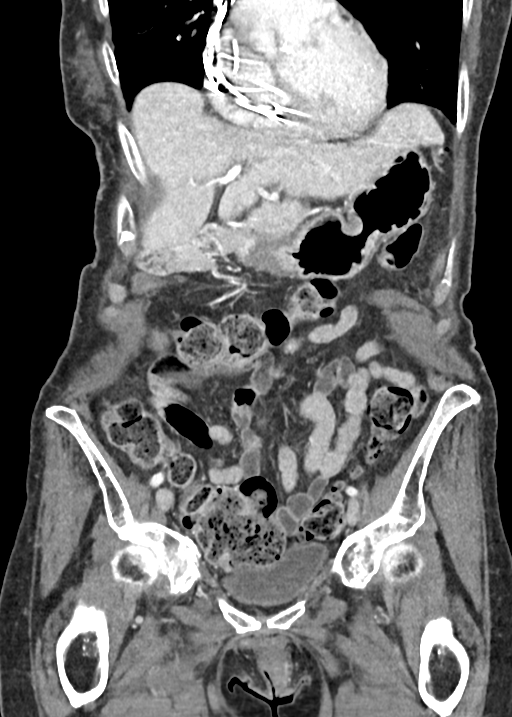
[im 69/124  soft-tissue]
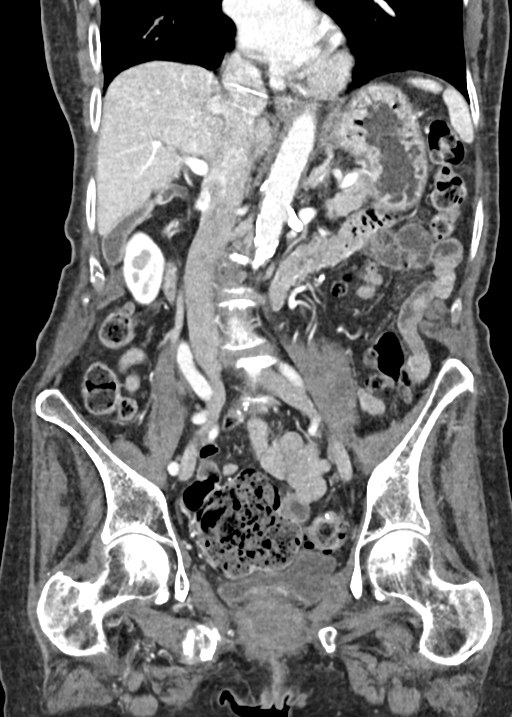

[13 of 46 positions shown; findings below may reference images not displayed]

FINDINGS: Lower chest: No acute abnormality.

Hepatobiliary: No focal liver abnormality is seen. No gallstones,
gallbladder wall thickening, or biliary dilatation.

Pancreas: Unremarkable. No pancreatic ductal dilatation or
surrounding inflammatory changes.

Spleen: Normal in size without focal abnormality.

Adrenals/Urinary Tract: Adrenal glands appear normal. Right renal
cyst is noted. No hydronephrosis or renal obstruction is noted. No
renal or ureteral calculi are noted. Urinary bladder is
unremarkable.

Stomach/Bowel: The stomach appears normal. There is no evidence of
bowel obstruction or inflammation. Status post appendectomy.

Vascular/Lymphatic: Aortic atherosclerosis. No enlarged abdominal or
pelvic lymph nodes.

Reproductive: Status post hysterectomy. No adnexal masses.

Other: No abdominal wall hernia or abnormality. No abdominopelvic
ascites.

Musculoskeletal: Subacute to old fractures are seen involving the
right superior and inferior pubic rami.
IMPRESSION: 1. Aortic atherosclerosis.
2. Subacute to old fractures are seen involving the right superior
and inferior pubic rami.
3. No acute abnormality seen in the abdomen or pelvis.

Aortic Atherosclerosis ([id number]-KR4.4).

## 2022-05-08 DIAGNOSIS — R350 Frequency of micturition: Secondary | ICD-10-CM | POA: Diagnosis not present

## 2022-05-09 ENCOUNTER — Ambulatory Visit (INDEPENDENT_AMBULATORY_CARE_PROVIDER_SITE_OTHER): Payer: Medicare PPO

## 2022-05-09 DIAGNOSIS — I428 Other cardiomyopathies: Secondary | ICD-10-CM

## 2022-05-09 LAB — CUP PACEART REMOTE DEVICE CHECK
Battery Remaining Longevity: 49 mo
Battery Remaining Percentage: 39 %
Battery Voltage: 2.98 V
Brady Statistic AP VP Percent: 7.2 %
Brady Statistic AP VS Percent: 1 %
Brady Statistic AS VP Percent: 89 %
Brady Statistic AS VS Percent: 2.8 %
Brady Statistic RA Percent Paced: 5.3 %
Brady Statistic RV Percent Paced: 96 %
Date Time Interrogation Session: 20231006020019
Implantable Lead Implant Date: 20170417
Implantable Lead Implant Date: 20170417
Implantable Lead Location: 753859
Implantable Lead Location: 753860
Implantable Pulse Generator Implant Date: 20170417
Lead Channel Impedance Value: 430 Ohm
Lead Channel Impedance Value: 450 Ohm
Lead Channel Pacing Threshold Amplitude: 0.5 V
Lead Channel Pacing Threshold Amplitude: 0.75 V
Lead Channel Pacing Threshold Pulse Width: 0.5 ms
Lead Channel Pacing Threshold Pulse Width: 0.5 ms
Lead Channel Sensing Intrinsic Amplitude: 1.2 mV
Lead Channel Sensing Intrinsic Amplitude: 12 mV
Lead Channel Setting Pacing Amplitude: 1 V
Lead Channel Setting Pacing Amplitude: 1.5 V
Lead Channel Setting Pacing Pulse Width: 0.5 ms
Lead Channel Setting Sensing Sensitivity: 2 mV
Pulse Gen Model: 2272
Pulse Gen Serial Number: 7883162

## 2022-05-13 NOTE — Progress Notes (Signed)
Remote pacemaker transmission.   

## 2022-06-19 DIAGNOSIS — R350 Frequency of micturition: Secondary | ICD-10-CM | POA: Diagnosis not present

## 2022-07-30 DIAGNOSIS — R300 Dysuria: Secondary | ICD-10-CM | POA: Diagnosis not present

## 2022-08-08 ENCOUNTER — Ambulatory Visit (INDEPENDENT_AMBULATORY_CARE_PROVIDER_SITE_OTHER): Payer: Medicare PPO

## 2022-08-08 DIAGNOSIS — I428 Other cardiomyopathies: Secondary | ICD-10-CM | POA: Diagnosis not present

## 2022-08-08 LAB — CUP PACEART REMOTE DEVICE CHECK
Battery Remaining Longevity: 44 mo
Battery Remaining Percentage: 37 %
Battery Voltage: 2.96 V
Brady Statistic AP VP Percent: 9.3 %
Brady Statistic AP VS Percent: 1 %
Brady Statistic AS VP Percent: 86 %
Brady Statistic AS VS Percent: 2.9 %
Brady Statistic RA Percent Paced: 7.5 %
Brady Statistic RV Percent Paced: 96 %
Date Time Interrogation Session: 20240105020015
Implantable Lead Connection Status: 753985
Implantable Lead Connection Status: 753985
Implantable Lead Implant Date: 20170417
Implantable Lead Implant Date: 20170417
Implantable Lead Location: 753859
Implantable Lead Location: 753860
Implantable Pulse Generator Implant Date: 20170417
Lead Channel Impedance Value: 410 Ohm
Lead Channel Impedance Value: 440 Ohm
Lead Channel Pacing Threshold Amplitude: 0.375 V
Lead Channel Pacing Threshold Amplitude: 0.625 V
Lead Channel Pacing Threshold Pulse Width: 0.5 ms
Lead Channel Pacing Threshold Pulse Width: 0.5 ms
Lead Channel Sensing Intrinsic Amplitude: 12 mV
Lead Channel Sensing Intrinsic Amplitude: 2.2 mV
Lead Channel Setting Pacing Amplitude: 0.875
Lead Channel Setting Pacing Amplitude: 1.375
Lead Channel Setting Pacing Pulse Width: 0.5 ms
Lead Channel Setting Sensing Sensitivity: 2 mV
Pulse Gen Model: 2272
Pulse Gen Serial Number: 7883162

## 2022-08-22 IMAGING — DX DG CHEST 2V
2 series · 2 of 2 positions shown · non-contrast
Comparison: Chest x-ray 08/02/2020.

CLINICAL DATA: 89-year-old female with history of dyspnea and
shortness of breath with cough at night for the past 3 months.

EXAM:
CHEST - 2 VIEW

[dg chest 2 view (1 of 2)]
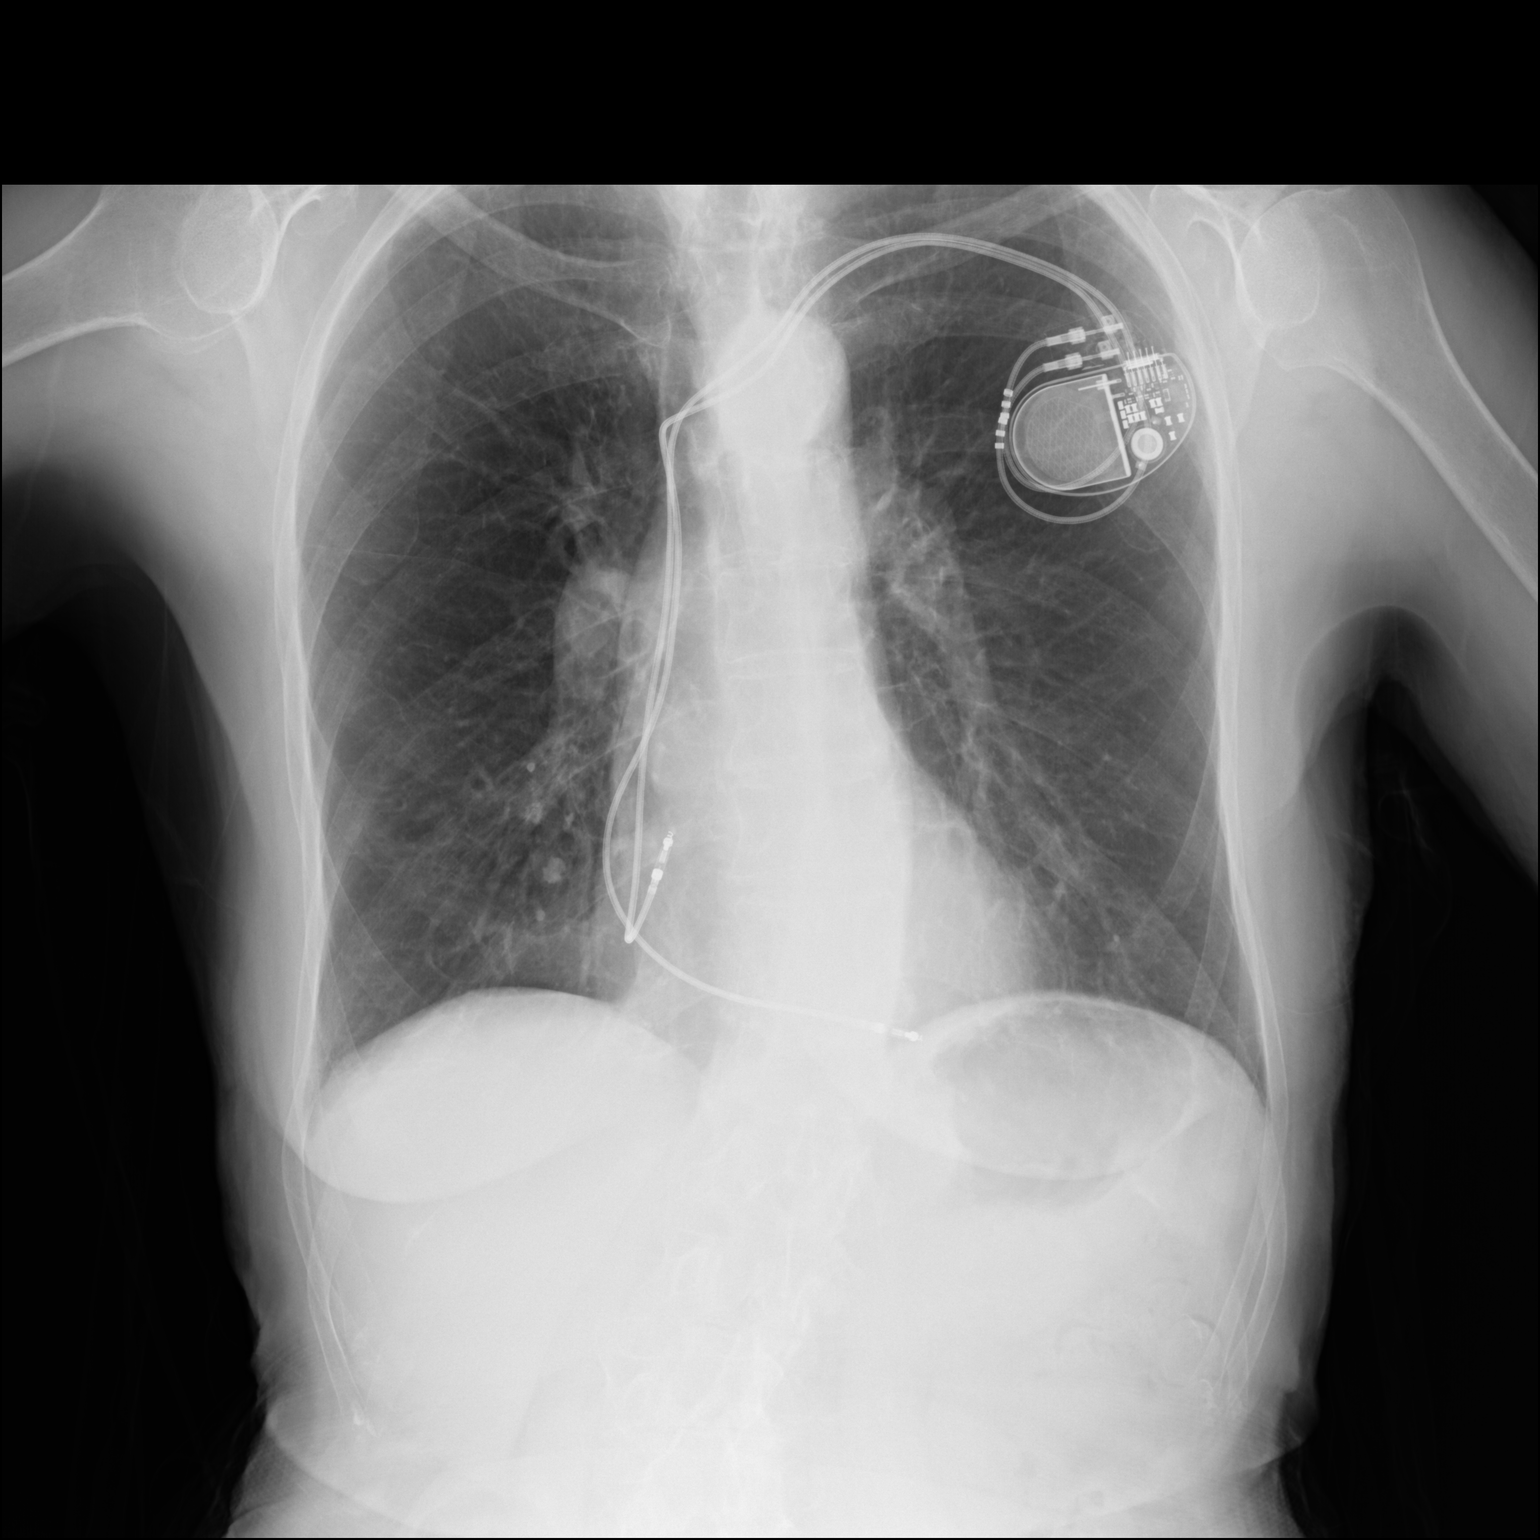

[dg chest 2 view (2 of 2)]
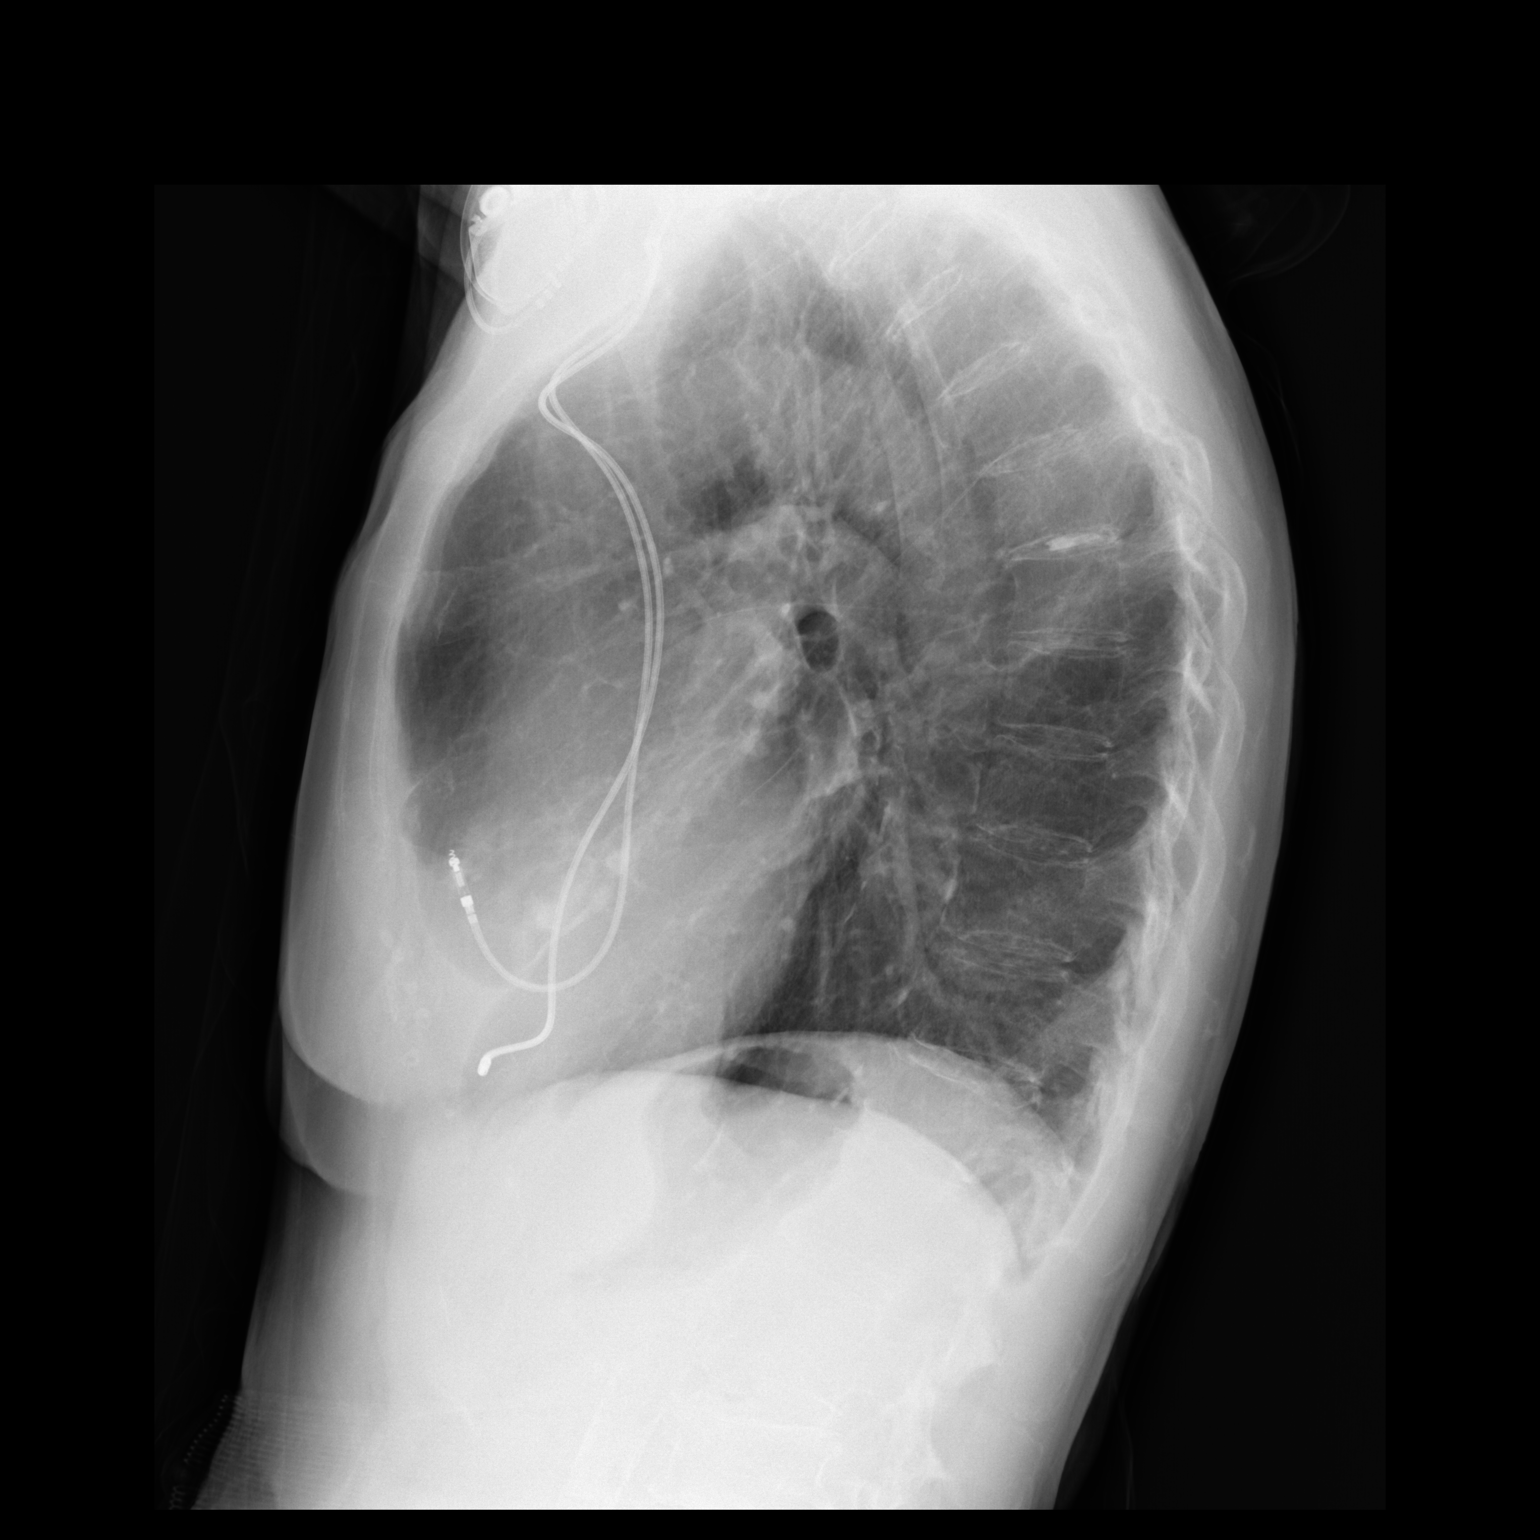

[2 of 2 positions shown; findings below may reference images not displayed]

FINDINGS: Lung volumes are normal. No consolidative airspace disease. No
pleural effusions. No pneumothorax. No pulmonary nodule or mass
noted. Pulmonary vasculature and the cardiomediastinal silhouette
are within normal limits. Atherosclerosis in the thoracic aorta.
Left-sided pacemaker device in place with lead tips projecting over
the expected location of the right atrium and right ventricle.
IMPRESSION: 1.  No radiographic evidence of acute cardiopulmonary disease.
2. Aortic atherosclerosis.

## 2022-08-25 NOTE — Progress Notes (Signed)
Remote pacemaker transmission.   

## 2022-09-14 NOTE — Progress Notes (Unsigned)
Cardiology Office Note Date:  02/23/2022  Patient ID:  Gabrielle, Hoffman Dec 13, 1930, MRN [id number] PCP:  Lajean Manes, MD  Cardiologist/Electrophysiologist:  Dr. Lovena Le   Chief Complaint:    *** 6 mo, overdue  History of Present Illness: Gabrielle Hoffman is a 89 y.o. female with history of syncope and sinus pauses, s/p PPM implant April 2017, HLD, HTN, mild memory loss, dementia, LV dysfunction (EF 45-50%), chronic LBBB, CM (likely NICM), AF (SCAF)   I saw her 09/12/20 She comes accompanied by her son. She reports she feels OK Denies to  Me any SOB, or coughing (her son reminds me of her dementia) She denies any CP, palpitations or cardiac awareness. No reports of dizziness, near syncope or syncope. Her son tells me that her cough is perhaps better but continues. He had noted more frequent urination with the addition of lasix. He notes it as a dry, nonproductive cough, most often noted in the evenings after dinner and her night meds. He has also noted no overt SOB but some ties seems to have a stutter to her breath, or a few quick inhales now and then. He noted that it started about 43moago after a hospital stay with a broken hip that apparently was complicated with what sounds like terrible nausea and some degree of bowel obstruction.  She remains with some intermittent constipation issues and occasional nausea. Follows with [facility]  The patient denies any belly pain or tenderness, eating well.  Strong suspicion her cough was GERD and advised raining HOB and not going to bed so soon after dinner, and also to f/u with her PMD and/or GI MD She did not appear volume OL   I saw her 11/20/20 She is again with her son today They are in a bit of a hurry with an appt with Dr. SFelipa Ethtoday as well. The patient denies any particular concerns, denies SOB Her son though add that the nighttime cough after dinner particularly continued Stopped the lasix because it did not seem to  help. He tried to have her stay up longer after dinner but is pretty insistent on getting into bed shortly afterwards.  It is an dry irritative sounding cough No SOB, no symptoms of PND or orthopnea are reported They have not seen GI but will see Dr. SFelipa Ethtoday. He mentions today that they walk outside fairly regularly and some days does quite well, and other days she has to stop ever few feet feeling winded. She denies any kind of CP, palpitations or cardiac awareness No reports of dizziness, near syncope or syncope. Planned for CXR and echo, recommended as well to discuss symptoms w/PMD  CXR w/NAP Echo noted LVEF reduced to 25-30% and recommended to come in for further discussion and management  She saw A. TChalmers Cater PA-C 01/23/21, son was concerned about declining functional status, also apparently found to have reactive airway disease He discussed that Pre-test probability of Myoview is relatively low with previously near normal EF at age of 89 Dr. TLovena Leagreed. Continue bisoprolol 2.5 mg daily K runs high. Repeat BMET today. May consider losartan but would likely require concomitant use of veltassa or similar. Would greatly appreciate clinical pharmacist assistance if this is the case.   Also discussed at length with the son worried with her age and overall frailty, polypharmacy is just as likely to cause complications as to improve her overall functional status. Her overall deconditioning is likely multifactorial and addressing any one issue  is unlikely to improve her overall status. Encouraged him to prepare for this eventuality. He states they have an unsigned DNR form at home, but that "she would want everything done".  She saw Dr. Lovena Le 04/02/21, with some reports of SOB, planned for a trial of lasix, to continue if helping and come in for labs, stop if not.  Called to come in with an acute rise in RV pacing %, pt/son report increased SOB, otherwise at her baseine  I saw her  08/28/21 The patient is accompanied by her son, who helps/cares for her He is worried abut her ongoing functional decline Seems to have less energy No reports of CP, but notes that intermittently she seems to have a stutter to her breathing, taking in some quick short breaths now and then. They walk on an occasional basis, once around the block and last week or so, once back to the house she seemed to lose her strength and have to catch herself on the car. She did not have syncope He asks about work-up/treatment for her weakened heart and is concerned that she is pacing more now. With reports of on going functional decline, planned for labs, her BP on the lower side and her BB reduced Planned to have her back in a couple months  Labs looked OK  I saw her 10/22/21 BP is better Son is very attentive and involved in her care. He worries about her, sees her aging, some decline in function and advancing dementia Intermittently he observes her to seem to "grunt" breathe funny No syncope She very much enjoys going to church still, loves the music. Overall unable to walk for as far or as long, but he continues to encourage her to stay active and engaged. The patient denies any concerns, symptoms.  AF burden <1%, no HVR, paced at 42 Suspected observed breathing pattern by her son was perhaps 2/2 slouched seated position, mechanical, Planned to follow device/AF burden no a/c at that juncture.  I saw her 02/25/22 The patient is with her son All in all about the same, he thinks she continues to slow some. He has added on worries about new health issues of his own now as well. The patient denies any symptoms or concerns Her son outside of some trend towards slowing down, nothing new. No reports of syncope, CP  *** AFib? *** volume *** fairly conservative approach  Device information:  SJM PPM implanted 11/19/15, Dr. Lovena Le, SB, CHB, (ILR was removed)   Past Medical History:  Diagnosis Date    Asthma    DDD (degenerative disc disease), lumbar    Dementia    very mild    Depression    Disc disorder of cervical region    Diverticulosis    Hyperlipidemia    Hypertension    Hyponatremia 12/17/2011   Kidney stones    "passed them"   Memory loss    Mitral valve prolapse    Osteopenia    Pneumonia    "I believe I had it a long time ago"   Presence of permanent cardiac pacemaker    Syncope    UTI (lower urinary tract infection)    Vaginal prolapse    Venous insufficiency     Past Surgical History:  Procedure Laterality Date   ABDOMINAL HYSTERECTOMY     APPENDECTOMY     EP IMPLANTABLE DEVICE N/A 08/27/2015   Procedure: Loop Recorder Insertion;  Surgeon: Evans Lance, MD;  Location: [facility];  Service: Cardiovascular;  Laterality: N/A;   EP IMPLANTABLE DEVICE N/A 11/19/2015   Procedure: Pacemaker Implant;  Surgeon: Evans Lance, MD;  Location: [facility];  Service: Cardiovascular;  Laterality: N/A;   INSERT / REPLACE / REMOVE PACEMAKER  11/19/2015   TONSILLECTOMY      Current Outpatient Medications  Medication Sig Dispense Refill   acetaminophen (TYLENOL) 500 MG tablet Take 2 tablets (1,000 mg total) by mouth 4 (four) times daily. (Patient not taking: Reported on 10/22/2021) 30 tablet 0   bisoprolol (ZEBETA) 5 MG tablet Take 0.5 tablets (2.5 mg total) by mouth at bedtime. 45 tablet 3   Cholecalciferol (VITAMIN D3) 25 MCG (1000 UT) CAPS Take 1 capsule by mouth daily. (Patient not taking: Reported on 10/22/2021)     donepezil (ARICEPT) 5 MG tablet Take 2.5 mg by mouth at bedtime.     dronabinol (MARINOL) 2.5 MG capsule Take 2.5 mg by mouth daily as needed (appetite).     feeding supplement (ENSURE ENLIVE / ENSURE PLUS) LIQD Take 237 mLs by mouth 3 (three) times daily between meals. (Patient not taking: Reported on 10/22/2021) 237 mL 12   ibuprofen (ADVIL) 200 MG tablet Take 200 mg by mouth every 6 (six) hours as needed for moderate pain. (Patient not  taking: Reported on 10/22/2021)     methimazole (TAPAZOLE) 10 MG tablet Take 5 mg by mouth daily.     ondansetron (ZOFRAN) 8 MG tablet 1 tablet as needed     pantoprazole (PROTONIX) 40 MG tablet Take 1 tablet (40 mg total) by mouth daily before supper. 30 tablet 0   No current facility-administered medications for this visit.    Allergies:   Carvedilol, Sulfa antibiotics, and Sulfamethoxazole   Social History:  The patient  reports that she has never smoked. She has never used smokeless tobacco. She reports that she does not drink alcohol and does not use drugs.   Family History:  The patient's family history includes Cancer in her sister.  ROS:  Please see the history of present illness.  All other systems are reviewed and otherwise negative.   PHYSICAL EXAM:  VS:  There were no vitals taken for this visit. BMI: There is no height or weight on file to calculate BMI. Well nourished, though quite thinwell developed, in no acute distress  HEENT: normocephalic, atraumatic  Neck: no JVD, carotid bruits or masses Cardiac: *** RRR; no significant murmurs, no rubs, or gallops Lungs: ***  CTA b/l, no wheezing, rhonchi or rales  Abd: soft, nontender MS: no deformity, age appropriate, perhaps advanced atrophy Ext: ***  no edema  Skin: warm and dry, no rash Neuro:  No gross deficits appreciated Psych: euthymic mood, full affect  *** PPM site is stable, no tethering or discomfort    EKG:  done today and reviewed by myself ***  PPM  interrogation today: reviewed by myself: ***   12/26/20: TTE IMPRESSIONS   1. Left ventricular ejection fraction, by estimation, is 25 to 30%. The  left ventricle has severely decreased function. The left ventricle  demonstrates global hypokinesis. There is moderate left ventricular  hypertrophy of the basal-septal segment. Left  ventricular diastolic parameters are consistent with Grade I diastolic  dysfunction (impaired relaxation).   2. Right  ventricular systolic function is normal. The right ventricular  size is normal.   3. Left atrial size was moderately dilated.   4. The mitral valve is normal in structure. No evidence of mitral valve  regurgitation. No  evidence of mitral stenosis.   5. The aortic valve is normal in structure. Aortic valve regurgitation is  mild. No aortic stenosis is present.   6. The inferior vena cava is normal in size with greater than 50%  respiratory variability, suggesting right atrial pressure of 3 mmHg.    07/26/15: Echocardiogram Study Conclusions  - Left ventricle: The cavity size was normal. There was mild focal   basal hypertrophy of the septum. Systolic function was mildly   reduced, primarily due to systolic dyssynchrony. The estimated   ejection fraction was in the range of 45% to 50%. Wall motion was   normal; there were no regional wall motion abnormalities. There   was fusion of early and atrial contributions to ventricular   filling. The study is not technically sufficient to allow   evaluation of LV diastolic function. - Ventricular septum: Septal motion showed abnormal function,   dyssynergy, and paradox. These changes are consistent with a left   bundle branch block. - Aortic valve: There was trivial regurgitation. - Mitral valve: Calcified annulus.    Recent Labs: 08/28/2021: BUN 12; Creatinine, Ser 0.83; Hemoglobin 11.3; Platelets 357; Potassium 4.9; Sodium 135  No results found for requested labs within last 365 days.   CrCl cannot be calculated (Patient's most recent lab result is older than the maximum 21 days allowed.).   Wt Readings from Last 3 Encounters:  10/22/21 107 lb (48.5 kg)  08/28/21 104 lb (47.2 kg)  04/02/21 106 lb 9.6 oz (48.4 kg)     Other studies reviewed: Additional studies/records reviewed today include: summarized above  ASSESSMENT AND PLAN:  1. Intermittent CHB, s/p PPM     Intact function, no programming changes made     AP *** %     VP  *** %  2. HTN     *** Looks ok, would allow some higher numbers for her  3. Mild CM,  EF 45-50% by her echo in 2016 >> May 2022 25-30% Prior note discuss d/w Dr. Lovena Le, with low yield in inschemic w/u Jonni Sanger d/w son as well, this and concerns that addition of drugs/polypharmacy could be problematic for her No plans for ischemic eval *** She does not appear volume OL by exam *** No new reports of SOB, I suspect perhaps 2/2 splinted breathing as she sits pretty slouched    4. SCAF *** Follow burden We have discussed this previously   Disposition:***    Current medicines are reviewed at length with the patient today.  The patient did not have any concerns regarding medicines.  Haywood Lasso, PA-C 02/23/2022 4:34 PM     [address] [place] [facility] [address] [phone] (office)  [phone] (fax)

## 2022-09-15 ENCOUNTER — Ambulatory Visit: Payer: Medicare PPO | Attending: Physician Assistant | Admitting: Physician Assistant

## 2022-09-15 ENCOUNTER — Encounter: Payer: Self-pay | Admitting: Physician Assistant

## 2022-09-15 VITALS — BP 120/80 | HR 72 | Ht 66.0 in | Wt 113.0 lb

## 2022-09-15 DIAGNOSIS — I10 Essential (primary) hypertension: Secondary | ICD-10-CM | POA: Diagnosis not present

## 2022-09-15 DIAGNOSIS — Z950 Presence of cardiac pacemaker: Secondary | ICD-10-CM

## 2022-09-15 DIAGNOSIS — I428 Other cardiomyopathies: Secondary | ICD-10-CM

## 2022-09-15 LAB — CUP PACEART INCLINIC DEVICE CHECK
Battery Remaining Longevity: 46 mo
Battery Voltage: 2.96 V
Brady Statistic RA Percent Paced: 7.2 %
Brady Statistic RV Percent Paced: 96 %
Date Time Interrogation Session: 20240212164300
Implantable Lead Connection Status: 753985
Implantable Lead Connection Status: 753985
Implantable Lead Implant Date: 20170417
Implantable Lead Implant Date: 20170417
Implantable Lead Location: 753859
Implantable Lead Location: 753860
Implantable Pulse Generator Implant Date: 20170417
Lead Channel Impedance Value: 412.5 Ohm
Lead Channel Impedance Value: 450 Ohm
Lead Channel Pacing Threshold Amplitude: 0.5 V
Lead Channel Pacing Threshold Amplitude: 0.75 V
Lead Channel Pacing Threshold Amplitude: 0.75 V
Lead Channel Pacing Threshold Pulse Width: 0.5 ms
Lead Channel Pacing Threshold Pulse Width: 0.5 ms
Lead Channel Pacing Threshold Pulse Width: 0.5 ms
Lead Channel Sensing Intrinsic Amplitude: 1.5 mV
Lead Channel Sensing Intrinsic Amplitude: 10.5 mV
Lead Channel Setting Pacing Amplitude: 0.875
Lead Channel Setting Pacing Amplitude: 1.5 V
Lead Channel Setting Pacing Pulse Width: 0.5 ms
Lead Channel Setting Sensing Sensitivity: 2 mV
Pulse Gen Model: 2272
Pulse Gen Serial Number: 7883162

## 2022-09-15 NOTE — Patient Instructions (Signed)
Medication Instructions:   Your physician recommends that you continue on your current medications as directed. Please refer to the Current Medication list given to you today.  *If you need a refill on your cardiac medications before your next appointment, please call your pharmacy*   Lab Work: [place]    If you have labs (blood work) drawn today and your tests are completely normal, you will receive your results only by: [place] (if you have MyChart) OR A paper copy in the mail If you have any lab test that is abnormal or we need to change your treatment, we will call you to review the results.   Testing/Procedures:  Your physician has requested that you have an echocardiogram. Echocardiography is a painless test that uses sound waves to create images of your heart. It provides your doctor with information about the size and shape of your heart and how well your heart's chambers and valves are working. This procedure takes approximately one hour. There are no restrictions for this procedure. Please do NOT wear cologne, perfume, aftershave, or lotions (deodorant is allowed). Please arrive 15 minutes prior to your appointment time.      Follow-Up: At [facility], you and your health needs are our priority.  As part of our continuing mission to provide you with exceptional heart care, we have created designated Provider Care Teams.  These Care Teams include your primary Cardiologist (physician) and Advanced Practice Providers (APPs -  Physician Assistants and Nurse Practitioners) who all work together to provide you with the care you need, when you need it.  We recommend signing up for the patient portal called "MyChart".  Sign up information is provided on this After Visit Summary.  MyChart is used to connect with patients for Virtual Visits (Telemedicine).  Patients are able to view lab/test results, encounter notes, upcoming appointments, etc.  Non-urgent  messages can be sent to your provider as well.   To learn more about what you can do with MyChart, go to [email or web address].    Your next appointment:   6 month(s)  Provider:   You may see Cristopher Peru, MD or one of the following Advanced Practice Providers on your designated Care Team:   Tommye Standard, [place] "Jonni Sanger" [place], [place] Mamie Levers, NP    Other Instructions

## 2022-10-15 ENCOUNTER — Ambulatory Visit (HOSPITAL_COMMUNITY): Payer: Medicare PPO | Attending: Physician Assistant

## 2022-10-15 DIAGNOSIS — I428 Other cardiomyopathies: Secondary | ICD-10-CM | POA: Insufficient documentation

## 2022-10-16 DIAGNOSIS — N390 Urinary tract infection, site not specified: Secondary | ICD-10-CM | POA: Diagnosis not present

## 2022-10-16 DIAGNOSIS — G301 Alzheimer's disease with late onset: Secondary | ICD-10-CM | POA: Diagnosis not present

## 2022-10-16 DIAGNOSIS — F0280 Dementia in other diseases classified elsewhere without behavioral disturbance: Secondary | ICD-10-CM | POA: Diagnosis not present

## 2022-10-16 LAB — ECHOCARDIOGRAM COMPLETE
Area-P 1/2: 2.58 cm2
P 1/2 time: 616 msec
S' Lateral: 2.8 cm

## 2022-10-21 ENCOUNTER — Other Ambulatory Visit: Payer: Self-pay | Admitting: Physician Assistant

## 2022-10-22 ENCOUNTER — Encounter: Payer: Self-pay | Admitting: Speech Pathology

## 2022-10-22 ENCOUNTER — Ambulatory Visit: Payer: Medicare PPO | Attending: Family Medicine | Admitting: Speech Pathology

## 2022-10-22 ENCOUNTER — Other Ambulatory Visit: Payer: Self-pay

## 2022-10-22 DIAGNOSIS — R1312 Dysphagia, oropharyngeal phase: Secondary | ICD-10-CM | POA: Insufficient documentation

## 2022-10-22 NOTE — Therapy (Addendum)
OUTPATIENT SPEECH LANGUAGE PATHOLOGY SWALLOW EVALUATION   Patient Name: Gabrielle Hoffman MRN: [id number] DOB:[birth year], 89 y.o., female Today's Date: 10/22/2022  PCP: London Pepper, MD REFERRING PROVIDER: London Pepper, MD  END OF SESSION:  End of Session - 10/22/22 1606     Visit Number 1    Authorization Type Medicare    SLP Start Time 78    SLP Stop Time  [id number]    SLP Time Calculation (min) 45 min    Activity Tolerance Patient tolerated treatment well             Past Medical History:  Diagnosis Date   Asthma    DDD (degenerative disc disease), lumbar    Dementia    very mild    Depression    Disc disorder of cervical region    Diverticulosis    Hyperlipidemia    Hypertension    Hyponatremia 12/17/2011   Kidney stones    "passed them"   Memory loss    Mitral valve prolapse    Osteopenia    Pneumonia    "I believe I had it a long time ago"   Presence of permanent cardiac pacemaker    Syncope    UTI (lower urinary tract infection)    Vaginal prolapse    Venous insufficiency    Past Surgical History:  Procedure Laterality Date   ABDOMINAL HYSTERECTOMY     APPENDECTOMY     EP IMPLANTABLE DEVICE N/A 08/27/2015   Procedure: Loop Recorder Insertion;  Surgeon: Evans Lance, MD;  Location: [facility];  Service: Cardiovascular;  Laterality: N/A;   EP IMPLANTABLE DEVICE N/A 11/19/2015   Procedure: Pacemaker Implant;  Surgeon: Evans Lance, MD;  Location: [facility];  Service: Cardiovascular;  Laterality: N/A;   INSERT / REPLACE / REMOVE PACEMAKER  11/19/2015   TONSILLECTOMY     Patient Active Problem List   Diagnosis Date Noted   Lung nodule 06/24/2020   Pelvic fracture ([place]) 06/24/2020   Nausea    Weight loss    Fracture of multiple pubic rami, right, closed, initial encounter ([place]) 06/23/2020   Fall at home, initial encounter 06/23/2020   Fracture of lumbar spine ([place]) 06/23/2020   Pacemaker 04/03/2020   Intermittent complete heart  block (HCC) [id number]   Chronic systolic heart failure (HCC)    Cognitive change 11/18/2015   Arrhythmia 11/18/2015   Syncope and collapse 11/18/2015   Syncope 07/11/2015   Low back pain 12/19/2011   Hyponatremia 12/17/2011   Dehydration 12/17/2011   UTI (lower urinary tract infection) 12/17/2011   HTN (hypertension) 12/17/2011   Dementia ([place]) 12/17/2011   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/12/2011   Open angle with borderline findings, low risk 05/12/2011   Dermatochalasis 05/12/2011    ONSET DATE: Pt's son reported "going on  year"   REFERRING DIAG: R13.10 (ICD-10-CM) - Dysphagia, unspecified   THERAPY DIAG:  Oropharyngeal dysphagia  Rationale for Evaluation and Treatment: Rehabilitation  SUBJECTIVE:   SUBJECTIVE STATEMENT: "I'm just living day by day. I'm not sure why I'm here" Pt accompanied by: family member (son: Jeneen Rinks)  PERTINENT HISTORY: Pt with hx of dementia. Son reports frequent throat clearing, especially when laying down at night, but has begun to occur frequently during the day. "She gets panicky sometimes when she clears her throat at night and tells me 'I can't clear my throat'". Eats a regular diet and no reported concern for aspiration when eating and drinking.   PAIN:  Are you  having pain? No  FALLS: Has patient fallen in last 6 months?  No  LIVING ENVIRONMENT: Lives with: lives with their family Lives in: House/apartment  PLOF:  Level of assistance: Needed assistance with ADLs, Needed assistance with IADLS Employment: Retired    OBJECTIVE:   DIAGNOSTIC FINDINGS: n/a  COGNITION: Overall cognitive status: History of cognitive impairments - at baseline (dementia)  ORAL MOTOR EXAMINATION: Overall status: WFL  CLINICAL SWALLOW ASSESSMENT:   Current diet: regular Dentition: adequate natural dentition Patient directly observed with POs: Yes: thin liquids and puree  Feeding: able to feed self Liquids provided by: cup Oral phase signs and  symptoms:  WFL Pharyngeal phase signs and symptoms:  No overt s/sx aspiration   PATIENT REPORTED OUTCOME MEASURES (PROM): Pt with hx of dementia, unreliable historian , therefore PROM deferred   TODAY'S TREATMENT:                                                                                                                                         DATE:   10/22/22: Education provided concerning managing and reducing impact of GERD, throat clearing, importance of oral hygiene (brush teeth twice a day), keeping pt ambulatory to manage potential aspiration risks. Provided suggestions for activities which may be engaging and safe for patient that require movement. To manage GERD, SLP recommends diet modifications (avoiding citrus, peppermints and caffeine), drink plenty of water, have last large meal 3+ hours prior to laying down, avoid foods which are problematic for patient. Frequent throat clearing and GERD can increased hypersensitivity in the throat, which increases mucosal production. SLP presented options of doing FEES and keeping pt on caseload with implementation of targeted interventions based on finding vs. Deferring instrumental study at this time with implementation of reflux precautions, monitoring for s/sx aspiration with PO. Advised pt's son to reach out to PCP for instrumental (FEES recommended to assess secretion management) if symptoms should become worse or not respond to provided recommendations. Pt's son elects to defer FEES at this time. Pt's son agreeable and verbalized understanding. Per son's request, SLP called cousin to inform of evaluation results and recommendations.   PATIENT EDUCATION: Education details: See above Person educated: Patient and Caregiver son Education method: Theatre stage manager Education comprehension: verbalized understanding   ASSESSMENT:  CLINICAL IMPRESSION: Patient is a 89 y.o. female who was seen today for dysphagia evaluation. SLP completed  clinical swallow exam, including Yale Swallow Protocol. No s/sx aspiration with thin liquid or puree observed. Based on son's report of throat clearing occurring at night and hx of GERD, suspect either poor secretion management or LPR causing throat clearing. FEES is recommended to assess secretion management and potential reflux. Son elects to defer FEES and monitor symptoms with reflux precautions at this time. Will reach out to PCP for referral for FEES study if symptoms persist. Unable to identify any swallow impairments during CSE and, without instrumental,  no exercises recommended. Throat clear alternatives and behavioral modifications not indicated d/t dementia.    PLAN:  SLP FREQUENCY: one time visit  Leroy Libman, [place] 10/22/2022, 4:07 PM

## 2022-10-22 NOTE — Patient Instructions (Addendum)
   ACID REFLUX PRECAUTIONS ACID REFLUX can be a possible cause of a voice disorder or throat irritation. Acid reflux is a disorder where acid from your stomach is abnormally spilled over onto your voice box after eating, during sleep, or even during singing. Acid reflux causes irritation and inflammation to your vocal folds and should be avoided and treated by changing eating habits, changing lifestyle, and taking medication (if prescribed by your doctor).   CHANGE EATING HABITS   Avoid "trigger" foods. Certain foods and drinks can trigger acid reflux.  1. Caffeine- in coffee, tea, chocolate, sodas  2. Carbonated beverages  3. Mint and menthol  4. Fatty/fried foods  5. Citrus fruits  6. Tomato products  7. Spicy foods  8. Alcohol    CHANGING LIFESTYLE HABITS   Drink 8 glasses of water per day (64oz)    Stop smoking   Avoid clearing your throat   Allow 3 hours between last big meal and going to bed at night   Keep yourself upright for one hour after you eat   Elevate the head of your bed using 6-inch blocks under the head of the bed or a bed wedge between the box spring and the mattress.   Eat small meals throughout the day rather than 3 big meals   Eat slowly   Wear loose clothing   TAKING MEDICATION   If prescribed one time a day, take 15-30 minutes before breakfast   If prescribed two times a day, take 15-30 minutes before breakfast and 15- 30 minutes before dinner

## 2022-10-23 DIAGNOSIS — N390 Urinary tract infection, site not specified: Secondary | ICD-10-CM | POA: Diagnosis not present

## 2022-10-24 ENCOUNTER — Telehealth: Payer: Self-pay | Admitting: Physician Assistant

## 2022-10-24 NOTE — Telephone Encounter (Signed)
Pt son calling for echo results

## 2022-10-24 NOTE — Telephone Encounter (Signed)
Returned call to patient's son, [name] (OK per [facility]).  Shared echo results from Tommye Standard, PA-C: Echo looks good, is actually improved from last.  Normal heart strength with no murmurs   [name] verbalized understanding and was very pleased with results, expressed appreciation for call.  [name] requests printed copy of results to be mailed to patient, mailing address confirmed.

## 2022-11-07 ENCOUNTER — Ambulatory Visit (INDEPENDENT_AMBULATORY_CARE_PROVIDER_SITE_OTHER): Payer: Medicare PPO

## 2022-11-07 DIAGNOSIS — I428 Other cardiomyopathies: Secondary | ICD-10-CM | POA: Diagnosis not present

## 2022-11-09 LAB — CUP PACEART REMOTE DEVICE CHECK
Battery Remaining Longevity: 41 mo
Battery Remaining Percentage: 34 %
Battery Voltage: 2.95 V
Brady Statistic AP VP Percent: 5.3 %
Brady Statistic AP VS Percent: 1 %
Brady Statistic AS VP Percent: 93 %
Brady Statistic AS VS Percent: 1.2 %
Brady Statistic RA Percent Paced: 4.9 %
Brady Statistic RV Percent Paced: 99 %
Date Time Interrogation Session: 20240405020015
Implantable Lead Connection Status: 753985
Implantable Lead Connection Status: 753985
Implantable Lead Implant Date: 20170417
Implantable Lead Implant Date: 20170417
Implantable Lead Location: 753859
Implantable Lead Location: 753860
Implantable Pulse Generator Implant Date: 20170417
Lead Channel Impedance Value: 410 Ohm
Lead Channel Impedance Value: 450 Ohm
Lead Channel Pacing Threshold Amplitude: 0.5 V
Lead Channel Pacing Threshold Amplitude: 0.75 V
Lead Channel Pacing Threshold Pulse Width: 0.5 ms
Lead Channel Pacing Threshold Pulse Width: 0.5 ms
Lead Channel Sensing Intrinsic Amplitude: 1 mV
Lead Channel Sensing Intrinsic Amplitude: 10.9 mV
Lead Channel Setting Pacing Amplitude: 1 V
Lead Channel Setting Pacing Amplitude: 1.5 V
Lead Channel Setting Pacing Pulse Width: 0.5 ms
Lead Channel Setting Sensing Sensitivity: 2 mV
Pulse Gen Model: 2272
Pulse Gen Serial Number: 7883162

## 2022-11-13 DIAGNOSIS — E0590 Thyrotoxicosis, unspecified without thyrotoxic crisis or storm: Secondary | ICD-10-CM | POA: Diagnosis not present

## 2022-11-13 DIAGNOSIS — R350 Frequency of micturition: Secondary | ICD-10-CM | POA: Diagnosis not present

## 2022-11-13 DIAGNOSIS — R252 Cramp and spasm: Secondary | ICD-10-CM | POA: Diagnosis not present

## 2022-11-14 DIAGNOSIS — R350 Frequency of micturition: Secondary | ICD-10-CM | POA: Diagnosis not present

## 2022-11-14 DIAGNOSIS — E0590 Thyrotoxicosis, unspecified without thyrotoxic crisis or storm: Secondary | ICD-10-CM | POA: Diagnosis not present

## 2022-11-14 DIAGNOSIS — R252 Cramp and spasm: Secondary | ICD-10-CM | POA: Diagnosis not present

## 2022-11-26 DIAGNOSIS — N390 Urinary tract infection, site not specified: Secondary | ICD-10-CM | POA: Diagnosis not present

## 2022-11-26 DIAGNOSIS — K219 Gastro-esophageal reflux disease without esophagitis: Secondary | ICD-10-CM | POA: Diagnosis not present

## 2022-11-26 DIAGNOSIS — I429 Cardiomyopathy, unspecified: Secondary | ICD-10-CM | POA: Diagnosis not present

## 2022-11-26 DIAGNOSIS — R32 Unspecified urinary incontinence: Secondary | ICD-10-CM | POA: Diagnosis not present

## 2022-11-26 DIAGNOSIS — E0590 Thyrotoxicosis, unspecified without thyrotoxic crisis or storm: Secondary | ICD-10-CM | POA: Diagnosis not present

## 2022-11-26 DIAGNOSIS — Z8249 Family history of ischemic heart disease and other diseases of the circulatory system: Secondary | ICD-10-CM | POA: Diagnosis not present

## 2022-11-26 DIAGNOSIS — I509 Heart failure, unspecified: Secondary | ICD-10-CM | POA: Diagnosis not present

## 2022-11-26 DIAGNOSIS — I110 Hypertensive heart disease with heart failure: Secondary | ICD-10-CM | POA: Diagnosis not present

## 2022-11-26 DIAGNOSIS — G309 Alzheimer's disease, unspecified: Secondary | ICD-10-CM | POA: Diagnosis not present

## 2022-12-10 NOTE — Progress Notes (Signed)
Remote pacemaker transmission.   

## 2022-12-17 DIAGNOSIS — E0590 Thyrotoxicosis, unspecified without thyrotoxic crisis or storm: Secondary | ICD-10-CM | POA: Diagnosis not present

## 2022-12-17 DIAGNOSIS — R413 Other amnesia: Secondary | ICD-10-CM | POA: Diagnosis not present

## 2022-12-17 DIAGNOSIS — R7309 Other abnormal glucose: Secondary | ICD-10-CM | POA: Diagnosis not present

## 2022-12-17 DIAGNOSIS — I5022 Chronic systolic (congestive) heart failure: Secondary | ICD-10-CM | POA: Diagnosis not present

## 2022-12-17 DIAGNOSIS — Z0000 Encounter for general adult medical examination without abnormal findings: Secondary | ICD-10-CM | POA: Diagnosis not present

## 2022-12-17 DIAGNOSIS — M810 Age-related osteoporosis without current pathological fracture: Secondary | ICD-10-CM | POA: Diagnosis not present

## 2022-12-17 DIAGNOSIS — R350 Frequency of micturition: Secondary | ICD-10-CM | POA: Diagnosis not present

## 2022-12-18 DIAGNOSIS — Z0000 Encounter for general adult medical examination without abnormal findings: Secondary | ICD-10-CM | POA: Diagnosis not present

## 2022-12-18 DIAGNOSIS — N390 Urinary tract infection, site not specified: Secondary | ICD-10-CM | POA: Diagnosis not present

## 2022-12-18 DIAGNOSIS — R350 Frequency of micturition: Secondary | ICD-10-CM | POA: Diagnosis not present

## 2023-01-12 DIAGNOSIS — N390 Urinary tract infection, site not specified: Secondary | ICD-10-CM | POA: Diagnosis not present

## 2023-01-12 DIAGNOSIS — E0590 Thyrotoxicosis, unspecified without thyrotoxic crisis or storm: Secondary | ICD-10-CM | POA: Diagnosis not present

## 2023-01-12 DIAGNOSIS — E0500 Thyrotoxicosis with diffuse goiter without thyrotoxic crisis or storm: Secondary | ICD-10-CM | POA: Diagnosis not present

## 2023-01-12 DIAGNOSIS — Z0000 Encounter for general adult medical examination without abnormal findings: Secondary | ICD-10-CM | POA: Diagnosis not present

## 2023-01-12 DIAGNOSIS — D649 Anemia, unspecified: Secondary | ICD-10-CM | POA: Diagnosis not present

## 2023-01-15 DIAGNOSIS — R300 Dysuria: Secondary | ICD-10-CM | POA: Diagnosis not present

## 2023-02-06 ENCOUNTER — Ambulatory Visit (INDEPENDENT_AMBULATORY_CARE_PROVIDER_SITE_OTHER): Payer: Medicare PPO

## 2023-02-06 DIAGNOSIS — I428 Other cardiomyopathies: Secondary | ICD-10-CM | POA: Diagnosis not present

## 2023-02-07 LAB — CUP PACEART REMOTE DEVICE CHECK
Battery Remaining Longevity: 38 mo
Battery Remaining Percentage: 32 %
Battery Voltage: 2.95 V
Brady Statistic AP VP Percent: 4.9 %
Brady Statistic AP VS Percent: 1 %
Brady Statistic AS VP Percent: 93 %
Brady Statistic AS VS Percent: 1.5 %
Brady Statistic RA Percent Paced: 4.3 %
Brady Statistic RV Percent Paced: 98 %
Date Time Interrogation Session: 20240705020013
Implantable Lead Connection Status: 753985
Implantable Lead Connection Status: 753985
Implantable Lead Implant Date: 20170417
Implantable Lead Implant Date: 20170417
Implantable Lead Location: 753859
Implantable Lead Location: 753860
Implantable Pulse Generator Implant Date: 20170417
Lead Channel Impedance Value: 410 Ohm
Lead Channel Impedance Value: 440 Ohm
Lead Channel Pacing Threshold Amplitude: 0.5 V
Lead Channel Pacing Threshold Amplitude: 0.625 V
Lead Channel Pacing Threshold Pulse Width: 0.5 ms
Lead Channel Pacing Threshold Pulse Width: 0.5 ms
Lead Channel Sensing Intrinsic Amplitude: 1.9 mV
Lead Channel Sensing Intrinsic Amplitude: 11.4 mV
Lead Channel Setting Pacing Amplitude: 0.875
Lead Channel Setting Pacing Amplitude: 1.5 V
Lead Channel Setting Pacing Pulse Width: 0.5 ms
Lead Channel Setting Sensing Sensitivity: 2 mV
Pulse Gen Model: 2272
Pulse Gen Serial Number: 7883162

## 2023-02-26 NOTE — Progress Notes (Signed)
Remote pacemaker transmission.   

## 2023-03-05 DIAGNOSIS — F0390 Unspecified dementia without behavioral disturbance: Secondary | ICD-10-CM | POA: Diagnosis not present

## 2023-03-05 DIAGNOSIS — N390 Urinary tract infection, site not specified: Secondary | ICD-10-CM | POA: Diagnosis not present

## 2023-03-05 DIAGNOSIS — R399 Unspecified symptoms and signs involving the genitourinary system: Secondary | ICD-10-CM | POA: Diagnosis not present

## 2023-03-17 ENCOUNTER — Encounter: Payer: Self-pay | Admitting: Internal Medicine

## 2023-03-17 ENCOUNTER — Ambulatory Visit: Payer: Medicare PPO | Attending: Internal Medicine | Admitting: Internal Medicine

## 2023-03-17 VITALS — BP 128/60 | HR 74 | Ht 66.0 in | Wt 110.0 lb

## 2023-03-17 DIAGNOSIS — I442 Atrioventricular block, complete: Secondary | ICD-10-CM

## 2023-03-17 LAB — CUP PACEART INCLINIC DEVICE CHECK
Battery Remaining Longevity: 39 mo
Battery Voltage: 2.95 V
Brady Statistic RA Percent Paced: 4.3 %
Brady Statistic RV Percent Paced: 98 %
Date Time Interrogation Session: 20240813194144
Implantable Lead Connection Status: 753985
Implantable Lead Connection Status: 753985
Implantable Lead Implant Date: 20170417
Implantable Lead Implant Date: 20170417
Implantable Lead Location: 753859
Implantable Lead Location: 753860
Implantable Pulse Generator Implant Date: 20170417
Lead Channel Impedance Value: 400 Ohm
Lead Channel Impedance Value: 450 Ohm
Lead Channel Pacing Threshold Amplitude: 0.5 V
Lead Channel Pacing Threshold Amplitude: 0.5 V
Lead Channel Pacing Threshold Amplitude: 0.75 V
Lead Channel Pacing Threshold Amplitude: 0.75 V
Lead Channel Pacing Threshold Pulse Width: 0.5 ms
Lead Channel Pacing Threshold Pulse Width: 0.5 ms
Lead Channel Pacing Threshold Pulse Width: 0.5 ms
Lead Channel Pacing Threshold Pulse Width: 0.5 ms
Lead Channel Sensing Intrinsic Amplitude: 1.1 mV
Lead Channel Sensing Intrinsic Amplitude: 8.6 mV
Lead Channel Setting Pacing Amplitude: 1 V
Lead Channel Setting Pacing Amplitude: 1.5 V
Lead Channel Setting Pacing Pulse Width: 0.5 ms
Lead Channel Setting Sensing Sensitivity: 2 mV
Pulse Gen Model: 2272
Pulse Gen Serial Number: 7883162

## 2023-03-17 NOTE — Progress Notes (Signed)
HPI Gabrielle Hoffman returns today for followup. She is a pleasant 89 yo woman with dementia, HTN, sinus node dysfunction, s/p PPM insertion. She denies chest pain or sob. She has had some dizzy spells. No syncope. Her son is with her today and notes that she has done ok but maybe a little more dyspneic. Her bp is up as well. She has not had any swelling.   Allergies  Allergen Reactions   Carvedilol Other (See Comments), Itching and Rash   Sulfa Antibiotics Rash   Sulfamethoxazole Rash     Current Outpatient Medications  Medication Sig Dispense Refill   bisoprolol (ZEBETA) 5 MG tablet TAKE 1/2 (ONE-HALF) TABLET BY MOUTH AT BEDTIME 45 tablet 3   ibuprofen (ADVIL) 200 MG tablet Take 200 mg by mouth every 6 (six) hours as needed for moderate pain.     methimazole (TAPAZOLE) 5 MG tablet Take 2.5 mg by mouth daily.     nitrofurantoin, macrocrystal-monohydrate, (MACROBID) 100 MG capsule Take 100 mg by mouth daily.     ondansetron (ZOFRAN) 8 MG tablet 1 tablet as needed     pantoprazole (PROTONIX) 40 MG tablet Take 1 tablet (40 mg total) by mouth daily before supper. 30 tablet 0   No current facility-administered medications for this visit.     Past Medical History:  Diagnosis Date   Asthma    DDD (degenerative disc disease), lumbar    Dementia    very mild    Depression    Disc disorder of cervical region    Diverticulosis    Hyperlipidemia    Hypertension    Hyponatremia 12/17/2011   Kidney stones    "passed them"   Memory loss    Mitral valve prolapse    Osteopenia    Pneumonia    "I believe I had it a long time ago"   Presence of permanent cardiac pacemaker    Syncope    UTI (lower urinary tract infection)    Vaginal prolapse    Venous insufficiency     ROS:   All systems reviewed and negative except as noted in the HPI.   Past Surgical History:  Procedure Laterality Date   ABDOMINAL HYSTERECTOMY     APPENDECTOMY     EP IMPLANTABLE DEVICE N/A 08/27/2015    Procedure: Loop Recorder Insertion;  Surgeon: Marinus Maw, MD;  Location: [facility] INVASIVE CV LAB;  Service: Cardiovascular;  Laterality: N/A;   EP IMPLANTABLE DEVICE N/A 11/19/2015   Procedure: Pacemaker Implant;  Surgeon: Marinus Maw, MD;  Location: [facility] INVASIVE CV LAB;  Service: Cardiovascular;  Laterality: N/A;   INSERT / REPLACE / REMOVE PACEMAKER  11/19/2015   TONSILLECTOMY       Family History  Problem Relation Age of Onset   Cancer Sister      Social History   Socioeconomic History   Marital status: Widowed    Spouse name: Not on file   Number of children: Not on file   Years of education: Not on file   Highest education level: Not on file  Occupational History   Not on file  Tobacco Use   Smoking status: Never   Smokeless tobacco: Never  Vaping Use   Vaping status: Never Used  Substance and Sexual Activity   Alcohol use: No   Drug use: No   Sexual activity: Not Currently    Birth control/protection: Post-menopausal  Other Topics Concern   Not on file  Social History Narrative  Not on file   Social Determinants of Health   Financial Resource Strain: Not on file  Food Insecurity: Not on file  Transportation Needs: Not on file  Physical Activity: Not on file  Stress: Not on file  Social Connections: Not on file  Intimate Partner Violence: Not on file     BP 128/60   Pulse 74   Ht 5\' 6"  (1.676 m)   Wt 110 lb (49.9 kg)   SpO2 98%   BMI 17.75 kg/m   Physical Exam:  Well appearing NAD HEENT: Unremarkable Neck:  No JVD, no thyromegally Lymphatics:  No adenopathy Back:  No CVA tenderness Lungs:  Clear with no wheezes HEART:  Regular rate rhythm, no murmurs, no rubs, no clicks Abd:  soft, positive bowel sounds, no organomegally, no rebound, no guarding Ext:  2 plus pulses, no edema, no cyanosis, no clubbing Skin:  No rashes no nodules Neuro:  CN II through XII intact, motor grossly intact  DEVICE  Normal device function.  See PaceArt for  details.   Assess/Plan: Sinus node dysfunction/CHB - she has no escape today at 40/min. She is asymptomatic.  Dementia - I asked her son about Namenda or Aricept and encouraged him to discuss with her primary MD PPM - her [employer] DDD PM is working normally. Continue.  HTN - her bp is better today. No change in her meds.  Sharlot Gowda Yaneisy Wenz,MD

## 2023-03-17 NOTE — Patient Instructions (Signed)
 Medication Instructions:  Your physician recommends that you continue on your current medications as directed. Please refer to the Current Medication list given to you today.  *If you need a refill on your cardiac medications before your next appointment, please call your pharmacy*  Lab Work: None ordered.  If you have labs (blood work) drawn today and your tests are completely normal, you will receive your results only by: MyChart Message (if you have MyChart) OR A paper copy in the mail If you have any lab test that is abnormal or we need to change your treatment, we will call you to review the results.  Testing/Procedures: None ordered.  Follow-Up: At [facility], you and your health needs are our priority.  As part of our continuing mission to provide you with exceptional heart care, we have created designated Provider Care Teams.  These Care Teams include your primary Cardiologist (physician) and Advanced Practice Providers (APPs -  Physician Assistants and Nurse Practitioners) who all work together to provide you with the care you need, when you need it.  We recommend signing up for the patient portal called "MyChart".  Sign up information is provided on this After Visit Summary.  MyChart is used to connect with patients for Virtual Visits (Telemedicine).  Patients are able to view lab/test results, encounter notes, upcoming appointments, etc.  Non-urgent messages can be sent to your provider as well.   To learn more about what you can do with MyChart, go to [email or web address].    Your next appointment:   1 year(s)  The format for your next appointment:   In Person  Provider:   Lewayne Bunting, MD{or one of the following Advanced Practice Providers on your designated Care Team:   Francis Dowse, [place] Casimiro Needle "Mardelle Matte" [place], [place] Earnest Rosier, NP   Important Information About Sugar

## 2023-04-09 ENCOUNTER — Inpatient Hospital Stay (HOSPITAL_COMMUNITY)
Admission: EM | Admit: 2023-04-09 | Discharge: 2023-04-15 | DRG: 522 | Disposition: A | Discharge status: Skilled Nursing Facility | Payer: Medicare PPO | Attending: Internal Medicine | Admitting: Internal Medicine

## 2023-04-09 ENCOUNTER — Emergency Department (HOSPITAL_COMMUNITY): Payer: Medicare PPO

## 2023-04-09 ENCOUNTER — Other Ambulatory Visit: Payer: Self-pay

## 2023-04-09 ENCOUNTER — Encounter (HOSPITAL_COMMUNITY): Payer: Self-pay

## 2023-04-09 DIAGNOSIS — S72009A Fracture of unspecified part of neck of unspecified femur, initial encounter for closed fracture: Secondary | ICD-10-CM | POA: Diagnosis present

## 2023-04-09 DIAGNOSIS — W19XXXA Unspecified fall, initial encounter: Secondary | ICD-10-CM | POA: Diagnosis not present

## 2023-04-09 DIAGNOSIS — S72002A Fracture of unspecified part of neck of left femur, initial encounter for closed fracture: Principal | ICD-10-CM | POA: Diagnosis present

## 2023-04-09 DIAGNOSIS — K219 Gastro-esophageal reflux disease without esophagitis: Secondary | ICD-10-CM | POA: Diagnosis present

## 2023-04-09 DIAGNOSIS — Z79899 Other long term (current) drug therapy: Secondary | ICD-10-CM

## 2023-04-09 DIAGNOSIS — I341 Nonrheumatic mitral (valve) prolapse: Secondary | ICD-10-CM | POA: Diagnosis present

## 2023-04-09 DIAGNOSIS — R531 Weakness: Secondary | ICD-10-CM | POA: Diagnosis not present

## 2023-04-09 DIAGNOSIS — F0393 Unspecified dementia, unspecified severity, with mood disturbance: Secondary | ICD-10-CM | POA: Diagnosis present

## 2023-04-09 DIAGNOSIS — Z7401 Bed confinement status: Secondary | ICD-10-CM | POA: Diagnosis not present

## 2023-04-09 DIAGNOSIS — Z888 Allergy status to other drugs, medicaments and biological substances status: Secondary | ICD-10-CM | POA: Diagnosis not present

## 2023-04-09 DIAGNOSIS — B9620 Unspecified Escherichia coli [E. coli] as the cause of diseases classified elsewhere: Secondary | ICD-10-CM | POA: Diagnosis present

## 2023-04-09 DIAGNOSIS — R55 Syncope and collapse: Secondary | ICD-10-CM | POA: Diagnosis not present

## 2023-04-09 DIAGNOSIS — I672 Cerebral atherosclerosis: Secondary | ICD-10-CM | POA: Diagnosis not present

## 2023-04-09 DIAGNOSIS — S199XXA Unspecified injury of neck, initial encounter: Secondary | ICD-10-CM | POA: Diagnosis not present

## 2023-04-09 DIAGNOSIS — F0390 Unspecified dementia without behavioral disturbance: Secondary | ICD-10-CM | POA: Diagnosis present

## 2023-04-09 DIAGNOSIS — E0590 Thyrotoxicosis, unspecified without thyrotoxic crisis or storm: Secondary | ICD-10-CM | POA: Diagnosis present

## 2023-04-09 DIAGNOSIS — M8580 Other specified disorders of bone density and structure, unspecified site: Secondary | ICD-10-CM | POA: Diagnosis present

## 2023-04-09 DIAGNOSIS — B965 Pseudomonas (aeruginosa) (mallei) (pseudomallei) as the cause of diseases classified elsewhere: Secondary | ICD-10-CM | POA: Diagnosis present

## 2023-04-09 DIAGNOSIS — I10 Essential (primary) hypertension: Secondary | ICD-10-CM | POA: Diagnosis present

## 2023-04-09 DIAGNOSIS — N390 Urinary tract infection, site not specified: Secondary | ICD-10-CM | POA: Diagnosis present

## 2023-04-09 DIAGNOSIS — Z96642 Presence of left artificial hip joint: Secondary | ICD-10-CM | POA: Diagnosis not present

## 2023-04-09 DIAGNOSIS — Z882 Allergy status to sulfonamides status: Secondary | ICD-10-CM

## 2023-04-09 DIAGNOSIS — Z471 Aftercare following joint replacement surgery: Secondary | ICD-10-CM | POA: Diagnosis not present

## 2023-04-09 DIAGNOSIS — M6281 Muscle weakness (generalized): Secondary | ICD-10-CM | POA: Diagnosis not present

## 2023-04-09 DIAGNOSIS — R262 Difficulty in walking, not elsewhere classified: Secondary | ICD-10-CM | POA: Diagnosis not present

## 2023-04-09 DIAGNOSIS — M25562 Pain in left knee: Secondary | ICD-10-CM | POA: Diagnosis not present

## 2023-04-09 DIAGNOSIS — J4520 Mild intermittent asthma, uncomplicated: Secondary | ICD-10-CM | POA: Diagnosis present

## 2023-04-09 DIAGNOSIS — R41841 Cognitive communication deficit: Secondary | ICD-10-CM | POA: Diagnosis not present

## 2023-04-09 DIAGNOSIS — Z809 Family history of malignant neoplasm, unspecified: Secondary | ICD-10-CM | POA: Diagnosis not present

## 2023-04-09 DIAGNOSIS — Z950 Presence of cardiac pacemaker: Secondary | ICD-10-CM

## 2023-04-09 DIAGNOSIS — E871 Hypo-osmolality and hyponatremia: Secondary | ICD-10-CM | POA: Diagnosis not present

## 2023-04-09 DIAGNOSIS — W1830XA Fall on same level, unspecified, initial encounter: Secondary | ICD-10-CM | POA: Diagnosis present

## 2023-04-09 DIAGNOSIS — E785 Hyperlipidemia, unspecified: Secondary | ICD-10-CM | POA: Diagnosis present

## 2023-04-09 DIAGNOSIS — Y92009 Unspecified place in unspecified non-institutional (private) residence as the place of occurrence of the external cause: Secondary | ICD-10-CM

## 2023-04-09 DIAGNOSIS — I6523 Occlusion and stenosis of bilateral carotid arteries: Secondary | ICD-10-CM | POA: Diagnosis not present

## 2023-04-09 DIAGNOSIS — J45909 Unspecified asthma, uncomplicated: Secondary | ICD-10-CM | POA: Diagnosis not present

## 2023-04-09 DIAGNOSIS — I5022 Chronic systolic (congestive) heart failure: Secondary | ICD-10-CM | POA: Diagnosis not present

## 2023-04-09 DIAGNOSIS — S72001D Fracture of unspecified part of neck of right femur, subsequent encounter for closed fracture with routine healing: Secondary | ICD-10-CM | POA: Diagnosis not present

## 2023-04-09 DIAGNOSIS — R9431 Abnormal electrocardiogram [ECG] [EKG]: Secondary | ICD-10-CM | POA: Diagnosis not present

## 2023-04-09 DIAGNOSIS — R079 Chest pain, unspecified: Secondary | ICD-10-CM | POA: Diagnosis not present

## 2023-04-09 DIAGNOSIS — K5900 Constipation, unspecified: Secondary | ICD-10-CM | POA: Diagnosis present

## 2023-04-09 DIAGNOSIS — S0990XA Unspecified injury of head, initial encounter: Secondary | ICD-10-CM | POA: Diagnosis not present

## 2023-04-09 DIAGNOSIS — I700 Atherosclerosis of aorta: Secondary | ICD-10-CM | POA: Diagnosis not present

## 2023-04-09 DIAGNOSIS — R000 Tachycardia, unspecified: Secondary | ICD-10-CM | POA: Diagnosis not present

## 2023-04-09 DIAGNOSIS — R1311 Dysphagia, oral phase: Secondary | ICD-10-CM | POA: Diagnosis not present

## 2023-04-09 DIAGNOSIS — R1111 Vomiting without nausea: Secondary | ICD-10-CM | POA: Diagnosis not present

## 2023-04-09 DIAGNOSIS — I110 Hypertensive heart disease with heart failure: Secondary | ICD-10-CM | POA: Diagnosis not present

## 2023-04-09 DIAGNOSIS — Z0189 Encounter for other specified special examinations: Secondary | ICD-10-CM | POA: Diagnosis not present

## 2023-04-09 DIAGNOSIS — Z66 Do not resuscitate: Secondary | ICD-10-CM | POA: Diagnosis present

## 2023-04-09 DIAGNOSIS — S72002D Fracture of unspecified part of neck of left femur, subsequent encounter for closed fracture with routine healing: Secondary | ICD-10-CM | POA: Diagnosis not present

## 2023-04-09 MED ORDER — FENTANYL CITRATE PF 50 MCG/ML IJ SOSY
50.0000 ug | PREFILLED_SYRINGE | Freq: Once | INTRAMUSCULAR | Status: AC
  Administered 2023-04-09: 50 ug via INTRAVENOUS
  Filled 2023-04-09: qty 1

## 2023-04-09 NOTE — ED Provider Notes (Signed)
[place] EMERGENCY DEPARTMENT AT [facility] Provider Note   CSN: [id number] Arrival date & time: 04/09/23  2248     History  Chief Complaint  Patient presents with   Fall   Level 5 caveat due to dementia Gabrielle Hoffman is a 89 y.o. female.  The history is provided by the patient and a relative.  Patient with history of dementia, heart block with pacemaker in place presents after mechanical fall history is provided by the son.  Patient lives at home with her son.  Son reports that patient has had decreased appetite for the past several days and lost about 4 pounds.  She went for her hair appointment today and [place]  After coming home patient was in the kitchen and she tripped on the rug falling on her left side.  Son is unsure if she hit her head, but no LOC.  She is not anticoagulated.  She is been having pain in her left leg.  He reports previous history of pelvic fracture  Patient is unable provide any history     Home Medications Prior to Admission medications   Medication Sig Start Date End Date Taking? Authorizing Provider  bisoprolol (ZEBETA) 5 MG tablet TAKE 1/2 (ONE-HALF) TABLET BY MOUTH AT BEDTIME 10/23/22   Sheilah Pigeon, PA-C  ibuprofen (ADVIL) 200 MG tablet Take 200 mg by mouth every 6 (six) hours as needed for moderate pain.    [provider]  methimazole (TAPAZOLE) 5 MG tablet Take 2.5 mg by mouth daily. 07/29/22   [provider]  nitrofurantoin, macrocrystal-monohydrate, (MACROBID) 100 MG capsule Take 100 mg by mouth daily.    [provider]  ondansetron (ZOFRAN) 8 MG tablet 1 tablet as needed 07/30/20   [provider]  pantoprazole (PROTONIX) 40 MG tablet Take 1 tablet (40 mg total) by mouth daily before supper. 06/30/20   Pahwani, Kasandra Knudsen, MD      Allergies    Carvedilol, Sulfa antibiotics, and Sulfamethoxazole    Review of Systems   Review of Systems  Unable to perform ROS:  Dementia    Physical Exam Updated Vital Signs BP (!) 189/83 (BP Location: Left Arm)   Pulse (!) 103   Temp 98.4 F (36.9 C) (Oral)   Resp 12   Ht 1.676 m (5\' 6" )   SpO2 100%   BMI 17.75 kg/m  Physical Exam CONSTITUTIONAL: Elderly, frail HEAD: Normocephalic/atraumatic EYES: EOMI/PERRL, no proptosis ENMT: Mucous membranes moist, ?  Bruising under left orbit No dental injury.  No nasal injury.  Face is stable NECK: Cervical collar in place SPINE/BACK:entire spine nontender CV: S1/S2 noted, no murmurs/rubs/gallops noted LUNGS: Lungs are clear to auscultation bilaterally, no apparent distress ABDOMEN: soft, nontender NEURO: Pt is awake/alert, pleasantly confused.  Moves all extremities x 4 EXTREMITIES: Distal pulses equal and intact. Patient keeps left hip flexed and tenderness with range of motion.  Tenderness to palpation of left knee. All other extremities/joints palpated/ranged and nontender SKIN: warm, color normal  ED Results / Procedures / Treatments   Labs (all labs ordered are listed, but only abnormal results are displayed) Labs Reviewed  CBC WITH DIFFERENTIAL/PLATELET - Abnormal; Notable for the following components:      Result Value   WBC 13.7 (*)    RBC 3.79 (*)    Hemoglobin 11.6 (*)    HCT 35.3 (*)    Neutro Abs 11.5 (*)    Abs Immature Granulocytes 0.10 (*)  All other components within normal limits  BASIC METABOLIC PANEL - Abnormal; Notable for the following components:   Chloride 97 (*)    Glucose, Bld 160 (*)    Anion gap 16 (*)    All other components within normal limits  URINALYSIS, ROUTINE W REFLEX MICROSCOPIC  TYPE AND SCREEN    EKG  ED ECG REPORT   Date: 09/05/20242331  Rate: 105  Rhythm:  paced rhythm  QRS Axis: left  Intervals: normal  ST/T Wave abnormalities: normal  Conduction Disutrbances:none  Narrative Interpretation:   Old EKG Reviewed: unchanged  I have personally reviewed the EKG tracing and agree with the computerized  printout as noted.   Radiology CT Head Wo Contrast  Result Date: 04/10/2023 CLINICAL DATA:  Head trauma, minor (Age >= 65y); Neck trauma (Age >= 65y). Fall EXAM: CT HEAD WITHOUT CONTRAST CT CERVICAL SPINE WITHOUT CONTRAST TECHNIQUE: Multidetector CT imaging of the head and cervical spine was performed following the standard protocol without intravenous contrast. Multiplanar CT image reconstructions of the cervical spine were also generated. RADIATION DOSE REDUCTION: This exam was performed according to the departmental dose-optimization program which includes automated exposure control, adjustment of the mA and/or kV according to patient size and/or use of iterative reconstruction technique. COMPARISON:  CT head 09/01/2017, CT C-spine 06/17/2015 FINDINGS: CT HEAD FINDINGS Brain: Cerebral ventricle sizes are concordant with the degree of cerebral volume loss. Patchy and confluent areas of decreased attenuation are noted throughout the deep and periventricular white matter of the cerebral hemispheres bilaterally, compatible with chronic microvascular ischemic disease. Right occipital encephalomalacia. No evidence of large-territorial acute infarction. No parenchymal hemorrhage. No mass lesion. No extra-axial collection. No mass effect or midline shift. No hydrocephalus. Basilar cisterns are patent. Vascular: No hyperdense vessel. Atherosclerotic calcifications are present within the cavernous internal carotid and vertebral arteries. Skull: No acute fracture or focal lesion. Bilateral temporomandibular joint degenerative changes. Sinuses/Orbits: Paranasal sinuses and mastoid air cells are clear. Bilateral lens replacement. Otherwise the orbits are unremarkable. Other: None. CT CERVICAL SPINE FINDINGS Alignment: Normal. Skull base and vertebrae: Multilevel moderate degenerative changes of the spine with intervertebral disc space vacuum phenomenon head the C5-C6 level as well as posterior disc osteophyte complex  formation. No associated severe osseous neural foraminal or central canal stenosis. No acute fracture. No aggressive appearing focal osseous lesion or focal pathologic process. Soft tissues and spinal canal: No prevertebral fluid or swelling. No visible canal hematoma. Upper chest: Biapical pleural/pulmonary scarring. Other: Atherosclerotic plaque of the carotid arteries within the neck. IMPRESSION: 1. No acute intracranial abnormality. 2. No acute displaced fracture or traumatic listhesis of the cervical spine. Electronically Signed   By: Tish Frederickson M.D.   On: 04/10/2023 00:33   CT Cervical Spine Wo Contrast  Result Date: 04/10/2023 CLINICAL DATA:  Head trauma, minor (Age >= 65y); Neck trauma (Age >= 65y). Fall EXAM: CT HEAD WITHOUT CONTRAST CT CERVICAL SPINE WITHOUT CONTRAST TECHNIQUE: Multidetector CT imaging of the head and cervical spine was performed following the standard protocol without intravenous contrast. Multiplanar CT image reconstructions of the cervical spine were also generated. RADIATION DOSE REDUCTION: This exam was performed according to the departmental dose-optimization program which includes automated exposure control, adjustment of the mA and/or kV according to patient size and/or use of iterative reconstruction technique. COMPARISON:  CT head 09/01/2017, CT C-spine 06/17/2015 FINDINGS: CT HEAD FINDINGS Brain: Cerebral ventricle sizes are concordant with the degree of cerebral volume loss. Patchy and confluent areas of decreased attenuation are noted throughout  the deep and periventricular white matter of the cerebral hemispheres bilaterally, compatible with chronic microvascular ischemic disease. Right occipital encephalomalacia. No evidence of large-territorial acute infarction. No parenchymal hemorrhage. No mass lesion. No extra-axial collection. No mass effect or midline shift. No hydrocephalus. Basilar cisterns are patent. Vascular: No hyperdense vessel. Atherosclerotic  calcifications are present within the cavernous internal carotid and vertebral arteries. Skull: No acute fracture or focal lesion. Bilateral temporomandibular joint degenerative changes. Sinuses/Orbits: Paranasal sinuses and mastoid air cells are clear. Bilateral lens replacement. Otherwise the orbits are unremarkable. Other: None. CT CERVICAL SPINE FINDINGS Alignment: Normal. Skull base and vertebrae: Multilevel moderate degenerative changes of the spine with intervertebral disc space vacuum phenomenon head the C5-C6 level as well as posterior disc osteophyte complex formation. No associated severe osseous neural foraminal or central canal stenosis. No acute fracture. No aggressive appearing focal osseous lesion or focal pathologic process. Soft tissues and spinal canal: No prevertebral fluid or swelling. No visible canal hematoma. Upper chest: Biapical pleural/pulmonary scarring. Other: Atherosclerotic plaque of the carotid arteries within the neck. IMPRESSION: 1. No acute intracranial abnormality. 2. No acute displaced fracture or traumatic listhesis of the cervical spine. Electronically Signed   By: Tish Frederickson M.D.   On: 04/10/2023 00:33   DG Hip Port [place] W or [place] Pelvis 1 View Left  Result Date: 04/10/2023 CLINICAL DATA:  pain.  Fall EXAM: DG HIP (WITH OR WITHOUT PELVIS) 1V PORT LEFT COMPARISON:  None Available. FINDINGS: Acute superiorly displaced left femoral neck fracture. No left hip dislocation. Frontal view of the right hip demonstrates no acute displaced fracture or dislocation. No acute displaced fracture or diastasis of the bones of the pelvis- Limited evaluation due to overlapping osseous structures and overlying soft tissues. There is no evidence of arthropathy or other focal bone abnormality. IMPRESSION: Acute superiorly displaced left femoral neck fracture. Electronically Signed   By: Tish Frederickson M.D.   On: 04/10/2023 00:25   DG Knee Left Port  Result Date: 04/10/2023 CLINICAL  DATA:  pain.  Fall EXAM: PORTABLE LEFT KNEE - 1-2 VIEW COMPARISON:  None Available. FINDINGS: No evidence of fracture, dislocation, or joint effusion. No evidence of severe arthropathy. No aggressive appearing focal bone abnormality. Soft tissues are unremarkable. Vascular calcification. IMPRESSION: No acute displaced fracture or dislocation. Electronically Signed   By: Tish Frederickson M.D.   On: 04/10/2023 00:16   DG Chest Portable 1 View  Result Date: 04/10/2023 CLINICAL DATA:  pain EXAM: PORTABLE CHEST 1 VIEW COMPARISON:  Chest x-ray 01/15/2021, CT chest 08/02/2020 FINDINGS: Dual lead left chest wall cardiac pacemaker. The heart and mediastinal contours are unchanged. Aortic calcification. Stable calcified right lower lower lung zone pulmonary nodules. No focal consolidation. Chronic coarse interstitial markings with no overt pulmonary edema. No pleural effusion. No pneumothorax. No acute osseous abnormality. IMPRESSION: No active disease. Electronically Signed   By: Tish Frederickson M.D.   On: 04/10/2023 00:11    Procedures Procedures    Medications Ordered in ED Medications  fentaNYL (SUBLIMAZE) injection 50 mcg (has no administration in time range)  sodium chloride 0.9 % bolus 500 mL (has no administration in time range)  fentaNYL (SUBLIMAZE) injection 50 mcg (50 mcg Intravenous Given 04/09/23 2333)    ED Course/ Medical Decision Making/ A&P Clinical Course as of 04/10/23 0129  Fri Apr 10, 2023  0100 Son markieta brisby [phone] [DW]  0104 Patient found to have left femoral neck fracture.  No other acute findings On secondary survey, no other signs of  acute traumatic injuries. Patient is awake and alert.  Discussed the case with Dr. Duwayne Heck [facility], request keeping patient n.p.o. will see in the morning.  Will admit to medical service [DW]  0115 Glucose(!): 160 Hyperglycemia [DW]  0115 WBC(!): 13.7 Leukocytosis [DW]  0128 Discussed with Dr. Maisie Fus with [facility].  She will see the  patient [DW]    Clinical Course User Index [DW] Zadie Rhine, MD           Glasgow Coma Scale Score: 14      NEXUS Criteria Score: 1                Medical Decision Making Amount and/or Complexity of Data Reviewed Labs: ordered. Decision-making details documented in ED Course. Radiology: ordered. ECG/medicine tests: ordered.  Risk Prescription drug management. Decision regarding hospitalization.   This patient presents to the ED for concern of fall, this involves an extensive number of treatment options, and is a complaint that carries with it a high risk of complications and morbidity.  The differential diagnosis includes but is not limited to head injury, concussion, subdural hematoma, skull fracture, subarachnoid hemorrhage  Comorbidities that complicate the patient evaluation: Patient's presentation is complicated by their history of dementia  Social Determinants of Health: Patient's  poor mobility   increases the complexity of managing their presentation  Additional history obtained: Additional history obtained from family Records reviewed  cardiology notes reviewed  Lab Tests: I Ordered, and personally interpreted labs.  The pertinent results include: Leukocytosis  Imaging Studies ordered: I ordered imaging studies including CT scan head and C-spine and X-ray chest x-ray, extremity x-rays   I independently visualized and interpreted imaging which showed left hip fracture I agree with the radiologist interpretation  Cardiac Monitoring: The patient was maintained on a cardiac monitor.  I personally viewed and interpreted the cardiac monitor which showed an underlying rhythm of:   Paced rhythm  Medicines ordered and prescription drug management: I ordered medication including fentanyl for pain Reevaluation of the patient after these medicines showed that the patient    stayed the same  Critical Interventions:   admission for left hip fracture  Consultations  Obtained: I requested consultation with the admitting physician [facility] and consultant orthopedics , and discussed  findings as well as pertinent plan - they recommend: Admit for operative management  Reevaluation: After the interventions noted above, I reevaluated the patient and found that they have :stayed the same  Complexity of problems addressed: Patient's presentation is most consistent with  acute presentation with potential threat to life or bodily function  Disposition: After consideration of the diagnostic results and the patient's response to treatment,  I feel that the patent would benefit from admission   .           Final Clinical Impression(s) / ED Diagnoses Final diagnoses:  Closed fracture of left hip, initial encounter [facility])    Rx / DC Orders ED Discharge Orders     None         Zadie Rhine, MD 04/10/23 0129

## 2023-04-09 NOTE — ED Triage Notes (Signed)
Pt BIB [facility] from home for a fall. Pt was at home in the kitchen and tripped over the rug and fell on her left side. Pt is not on blood thinners and did not hit her head. Pt has a hx of a fractured pelvis. Fall was witnessed by son. Pt has hx of dementia and a pacemaker.   EMS VS BP initial 220/100 182/86 HR 100 O2 98-100% RA CBG 176

## 2023-04-10 ENCOUNTER — Inpatient Hospital Stay (HOSPITAL_COMMUNITY): Payer: Medicare PPO

## 2023-04-10 ENCOUNTER — Inpatient Hospital Stay (HOSPITAL_COMMUNITY): Payer: Medicare PPO | Admitting: Anesthesiology

## 2023-04-10 ENCOUNTER — Encounter (HOSPITAL_COMMUNITY): Payer: Self-pay | Admitting: Internal Medicine

## 2023-04-10 ENCOUNTER — Encounter (HOSPITAL_COMMUNITY)
Admission: EM | Disposition: A | Discharge status: Skilled Nursing Facility | Payer: Self-pay | Source: Home / Self Care | Attending: Internal Medicine

## 2023-04-10 ENCOUNTER — Emergency Department (HOSPITAL_COMMUNITY): Payer: Medicare PPO

## 2023-04-10 DIAGNOSIS — S72002A Fracture of unspecified part of neck of left femur, initial encounter for closed fracture: Secondary | ICD-10-CM

## 2023-04-10 DIAGNOSIS — E785 Hyperlipidemia, unspecified: Secondary | ICD-10-CM | POA: Diagnosis present

## 2023-04-10 DIAGNOSIS — S72009A Fracture of unspecified part of neck of unspecified femur, initial encounter for closed fracture: Secondary | ICD-10-CM | POA: Diagnosis present

## 2023-04-10 DIAGNOSIS — Z882 Allergy status to sulfonamides status: Secondary | ICD-10-CM | POA: Diagnosis not present

## 2023-04-10 DIAGNOSIS — I341 Nonrheumatic mitral (valve) prolapse: Secondary | ICD-10-CM | POA: Diagnosis present

## 2023-04-10 DIAGNOSIS — Z809 Family history of malignant neoplasm, unspecified: Secondary | ICD-10-CM | POA: Diagnosis not present

## 2023-04-10 DIAGNOSIS — Z950 Presence of cardiac pacemaker: Secondary | ICD-10-CM | POA: Diagnosis not present

## 2023-04-10 DIAGNOSIS — E871 Hypo-osmolality and hyponatremia: Secondary | ICD-10-CM | POA: Diagnosis not present

## 2023-04-10 DIAGNOSIS — I5022 Chronic systolic (congestive) heart failure: Secondary | ICD-10-CM | POA: Diagnosis not present

## 2023-04-10 DIAGNOSIS — Z66 Do not resuscitate: Secondary | ICD-10-CM | POA: Diagnosis present

## 2023-04-10 DIAGNOSIS — I10 Essential (primary) hypertension: Secondary | ICD-10-CM | POA: Diagnosis present

## 2023-04-10 DIAGNOSIS — I110 Hypertensive heart disease with heart failure: Secondary | ICD-10-CM

## 2023-04-10 DIAGNOSIS — Z79899 Other long term (current) drug therapy: Secondary | ICD-10-CM | POA: Diagnosis not present

## 2023-04-10 DIAGNOSIS — Y92009 Unspecified place in unspecified non-institutional (private) residence as the place of occurrence of the external cause: Secondary | ICD-10-CM | POA: Diagnosis not present

## 2023-04-10 DIAGNOSIS — Z888 Allergy status to other drugs, medicaments and biological substances status: Secondary | ICD-10-CM | POA: Diagnosis not present

## 2023-04-10 DIAGNOSIS — W1830XA Fall on same level, unspecified, initial encounter: Secondary | ICD-10-CM | POA: Diagnosis present

## 2023-04-10 DIAGNOSIS — B965 Pseudomonas (aeruginosa) (mallei) (pseudomallei) as the cause of diseases classified elsewhere: Secondary | ICD-10-CM | POA: Diagnosis present

## 2023-04-10 DIAGNOSIS — S72002D Fracture of unspecified part of neck of left femur, subsequent encounter for closed fracture with routine healing: Secondary | ICD-10-CM | POA: Diagnosis not present

## 2023-04-10 DIAGNOSIS — K5900 Constipation, unspecified: Secondary | ICD-10-CM | POA: Diagnosis present

## 2023-04-10 DIAGNOSIS — Z8781 Personal history of (healed) traumatic fracture: Secondary | ICD-10-CM

## 2023-04-10 DIAGNOSIS — F0393 Unspecified dementia, unspecified severity, with mood disturbance: Secondary | ICD-10-CM | POA: Diagnosis present

## 2023-04-10 DIAGNOSIS — J4520 Mild intermittent asthma, uncomplicated: Secondary | ICD-10-CM | POA: Diagnosis present

## 2023-04-10 DIAGNOSIS — B9620 Unspecified Escherichia coli [E. coli] as the cause of diseases classified elsewhere: Secondary | ICD-10-CM | POA: Diagnosis present

## 2023-04-10 DIAGNOSIS — N390 Urinary tract infection, site not specified: Secondary | ICD-10-CM

## 2023-04-10 DIAGNOSIS — E0590 Thyrotoxicosis, unspecified without thyrotoxic crisis or storm: Secondary | ICD-10-CM | POA: Diagnosis present

## 2023-04-10 DIAGNOSIS — K219 Gastro-esophageal reflux disease without esophagitis: Secondary | ICD-10-CM | POA: Diagnosis present

## 2023-04-10 DIAGNOSIS — S72001D Fracture of unspecified part of neck of right femur, subsequent encounter for closed fracture with routine healing: Secondary | ICD-10-CM | POA: Diagnosis not present

## 2023-04-10 DIAGNOSIS — M8580 Other specified disorders of bone density and structure, unspecified site: Secondary | ICD-10-CM | POA: Diagnosis present

## 2023-04-10 HISTORY — PX: HIP ARTHROPLASTY: SHX981

## 2023-04-10 LAB — CBC WITH DIFFERENTIAL/PLATELET
Abs Immature Granulocytes: 0.1 10*3/uL — ABNORMAL HIGH (ref 0.00–0.07)
Basophils Absolute: 0.1 10*3/uL (ref 0.0–0.1)
Basophils Relative: 0 %
Eosinophils Absolute: 0.1 10*3/uL (ref 0.0–0.5)
Eosinophils Relative: 0 %
HCT: 35.3 % — ABNORMAL LOW (ref 36.0–46.0)
Hemoglobin: 11.6 g/dL — ABNORMAL LOW (ref 12.0–15.0)
Immature Granulocytes: 1 %
Lymphocytes Relative: 7 %
Lymphs Abs: 0.9 10*3/uL (ref 0.7–4.0)
MCH: 30.6 pg (ref 26.0–34.0)
MCHC: 32.9 g/dL (ref 30.0–36.0)
MCV: 93.1 fL (ref 80.0–100.0)
Monocytes Absolute: 1 10*3/uL (ref 0.1–1.0)
Monocytes Relative: 7 %
Neutro Abs: 11.5 10*3/uL — ABNORMAL HIGH (ref 1.7–7.7)
Neutrophils Relative %: 85 %
Platelets: 344 10*3/uL (ref 150–400)
RBC: 3.79 MIL/uL — ABNORMAL LOW (ref 3.87–5.11)
RDW: 14.6 % (ref 11.5–15.5)
WBC: 13.7 10*3/uL — ABNORMAL HIGH (ref 4.0–10.5)
nRBC: 0 % (ref 0.0–0.2)

## 2023-04-10 LAB — BASIC METABOLIC PANEL
Anion gap: 16 — ABNORMAL HIGH (ref 5–15)
Anion gap: 16 — ABNORMAL HIGH (ref 5–15)
BUN: 16 mg/dL (ref 8–23)
BUN: 11 mg/dL (ref 8–23)
CO2: 24 mmol/L (ref 22–32)
CO2: 21 mmol/L — ABNORMAL LOW (ref 22–32)
Calcium: 9.7 mg/dL (ref 8.9–10.3)
Calcium: 9.1 mg/dL (ref 8.9–10.3)
Chloride: 97 mmol/L — ABNORMAL LOW (ref 98–111)
Chloride: 97 mmol/L — ABNORMAL LOW (ref 98–111)
Creatinine, Ser: 0.87 mg/dL (ref 0.44–1.00)
Creatinine, Ser: 0.8 mg/dL (ref 0.44–1.00)
GFR, Estimated: >60 mL/min (ref >60)
GFR, Estimated: >60 mL/min (ref >60)
Glucose, Bld: 160 mg/dL — ABNORMAL HIGH (ref 70–99)
Glucose, Bld: 119 mg/dL — ABNORMAL HIGH (ref 70–99)
Potassium: 3.8 mmol/L (ref 3.5–5.1)
Potassium: 3.8 mmol/L (ref 3.5–5.1)
Sodium: 137 mmol/L (ref 135–145)
Sodium: 134 mmol/L — ABNORMAL LOW (ref 135–145)

## 2023-04-10 LAB — URINALYSIS, ROUTINE W REFLEX MICROSCOPIC
Bilirubin Urine: NEGATIVE
Glucose, UA: 50 mg/dL — AB
Hgb urine dipstick: NEGATIVE
Ketones, ur: 5 mg/dL — AB
Nitrite: POSITIVE — AB
Protein, ur: NEGATIVE mg/dL
Specific Gravity, Urine: 1.01 (ref 1.005–1.030)
pH: 7 (ref 5.0–8.0)

## 2023-04-10 LAB — CBC
HCT: 37.9 % (ref 36.0–46.0)
Hemoglobin: 12.5 g/dL (ref 12.0–15.0)
MCH: 29.7 pg (ref 26.0–34.0)
MCHC: 33 g/dL (ref 30.0–36.0)
MCV: 90 fL (ref 80.0–100.0)
Platelets: 285 10*3/uL (ref 150–400)
RBC: 4.21 MIL/uL (ref 3.87–5.11)
RDW: 14.5 % (ref 11.5–15.5)
WBC: 13.9 10*3/uL — ABNORMAL HIGH (ref 4.0–10.5)
nRBC: 0 % (ref 0.0–0.2)

## 2023-04-10 LAB — SURGICAL PCR SCREEN
MRSA, PCR: POSITIVE — AB
Staphylococcus aureus: POSITIVE — AB

## 2023-04-10 LAB — TYPE AND SCREEN
ABO/RH(D): O POS
Antibody Screen: NEGATIVE

## 2023-04-10 LAB — ABO/RH: ABO/RH(D): O POS

## 2023-04-10 LAB — VITAMIN D 25 HYDROXY (VIT D DEFICIENCY, FRACTURES): Vit D, 25-Hydroxy: 47.03 ng/mL (ref 30–100)

## 2023-04-10 SURGERY — HEMIARTHROPLASTY, HIP, DIRECT ANTERIOR APPROACH, FOR FRACTURE
Anesthesia: General | Site: Hip | Laterality: Left

## 2023-04-10 MED ORDER — CHLORHEXIDINE GLUCONATE 4 % EX SOLN: 60.0000 mL | Freq: Once | CUTANEOUS | Status: DC

## 2023-04-10 MED ORDER — CHLORHEXIDINE GLUCONATE CLOTH 2 % EX PADS
6.0000 | MEDICATED_PAD | Freq: Every day | CUTANEOUS | Status: AC
  Administered 2023-04-10 – 2023-04-14 (×5): 6 via TOPICAL

## 2023-04-10 MED ORDER — PROPOFOL 10 MG/ML IV BOLUS
INTRAVENOUS | Status: AC
  Filled 2023-04-10: qty 20

## 2023-04-10 MED ORDER — SODIUM CHLORIDE 0.9 % IR SOLN
Status: DC | PRN
  Administered 2023-04-10: 1000 mL

## 2023-04-10 MED ORDER — SODIUM CHLORIDE 0.9 % IV BOLUS (SEPSIS)
500.0000 mL | Freq: Once | INTRAVENOUS | Status: AC
  Administered 2023-04-10: 500 mL via INTRAVENOUS

## 2023-04-10 MED ORDER — ONDANSETRON HCL 4 MG/2ML IJ SOLN
INTRAMUSCULAR | Status: AC
  Filled 2023-04-10: qty 2

## 2023-04-10 MED ORDER — TRANEXAMIC ACID-NACL 1000-0.7 MG/100ML-% IV SOLN
1000.0000 mg | INTRAVENOUS | Status: AC
  Administered 2023-04-10: 1000 mg via INTRAVENOUS
  Filled 2023-04-10: qty 100

## 2023-04-10 MED ORDER — HYDROCODONE-ACETAMINOPHEN 5-325 MG PO TABS
1.0000 | ORAL_TABLET | Freq: Four times a day (QID) | ORAL | Status: DC | PRN
  Administered 2023-04-10 – 2023-04-14 (×4): 2 via ORAL
  Filled 2023-04-10 (×4): qty 2

## 2023-04-10 MED ORDER — ACETAMINOPHEN 10 MG/ML IV SOLN
INTRAVENOUS | Status: AC
  Filled 2023-04-10: qty 100

## 2023-04-10 MED ORDER — LIDOCAINE 2% (20 MG/ML) 5 ML SYRINGE
INTRAMUSCULAR | Status: DC | PRN
  Administered 2023-04-10: 60 mg via INTRAVENOUS

## 2023-04-10 MED ORDER — FENTANYL CITRATE (PF) 250 MCG/5ML IJ SOLN
INTRAMUSCULAR | Status: AC
  Filled 2023-04-10: qty 5

## 2023-04-10 MED ORDER — MUPIROCIN 2 % EX OINT
1.0000 | TOPICAL_OINTMENT | Freq: Two times a day (BID) | CUTANEOUS | Status: AC
  Administered 2023-04-10 – 2023-04-14 (×10): 1 via NASAL
  Filled 2023-04-10: qty 22

## 2023-04-10 MED ORDER — LIDOCAINE 2% (20 MG/ML) 5 ML SYRINGE
INTRAMUSCULAR | Status: AC
  Filled 2023-04-10: qty 5

## 2023-04-10 MED ORDER — FENTANYL CITRATE (PF) 250 MCG/5ML IJ SOLN
INTRAMUSCULAR | Status: DC | PRN
  Administered 2023-04-10: 25 ug via INTRAVENOUS
  Administered 2023-04-10 (×2): 50 ug via INTRAVENOUS

## 2023-04-10 MED ORDER — VANCOMYCIN HCL IN DEXTROSE 1-5 GM/200ML-% IV SOLN
1000.0000 mg | INTRAVENOUS | Status: AC
  Administered 2023-04-10: 1000 mg via INTRAVENOUS
  Filled 2023-04-10: qty 200

## 2023-04-10 MED ORDER — METHOCARBAMOL 1000 MG/10ML IJ SOLN: 500.0000 mg | Freq: Four times a day (QID) | INTRAVENOUS | Status: DC | PRN

## 2023-04-10 MED ORDER — ONDANSETRON HCL 4 MG/2ML IJ SOLN: 4.0000 mg | Freq: Once | INTRAMUSCULAR | Status: DC | PRN

## 2023-04-10 MED ORDER — PHENYLEPHRINE HCL-NACL 20-0.9 MG/250ML-% IV SOLN
INTRAVENOUS | Status: DC | PRN
  Administered 2023-04-10: 45 ug/min via INTRAVENOUS

## 2023-04-10 MED ORDER — ROCURONIUM BROMIDE 10 MG/ML (PF) SYRINGE
PREFILLED_SYRINGE | INTRAVENOUS | Status: DC | PRN
  Administered 2023-04-10: 50 mg via INTRAVENOUS

## 2023-04-10 MED ORDER — SODIUM CHLORIDE 0.9 % IV SOLN: INTRAVENOUS | Status: AC

## 2023-04-10 MED ORDER — ROCURONIUM BROMIDE 10 MG/ML (PF) SYRINGE
PREFILLED_SYRINGE | INTRAVENOUS | Status: AC
  Filled 2023-04-10: qty 10

## 2023-04-10 MED ORDER — ONDANSETRON HCL 4 MG/2ML IJ SOLN
INTRAMUSCULAR | Status: DC | PRN
  Administered 2023-04-10: 4 mg via INTRAVENOUS

## 2023-04-10 MED ORDER — CEFAZOLIN SODIUM-DEXTROSE 2-4 GM/100ML-% IV SOLN
2.0000 g | INTRAVENOUS | Status: AC
  Administered 2023-04-10: 2 g via INTRAVENOUS
  Filled 2023-04-10: qty 100

## 2023-04-10 MED ORDER — PANTOPRAZOLE SODIUM 40 MG PO TBEC
40.0000 mg | DELAYED_RELEASE_TABLET | Freq: Every day | ORAL | Status: DC
  Administered 2023-04-11 – 2023-04-14 (×4): 40 mg via ORAL
  Filled 2023-04-10 (×4): qty 1

## 2023-04-10 MED ORDER — HEPARIN SODIUM (PORCINE) 5000 UNIT/ML IJ SOLN
5000.0000 [IU] | Freq: Three times a day (TID) | INTRAMUSCULAR | Status: DC
  Administered 2023-04-11 – 2023-04-15 (×11): 5000 [IU] via SUBCUTANEOUS
  Filled 2023-04-10 (×11): qty 1

## 2023-04-10 MED ORDER — SUGAMMADEX SODIUM 200 MG/2ML IV SOLN
INTRAVENOUS | Status: DC | PRN
  Administered 2023-04-10: 200 mg via INTRAVENOUS

## 2023-04-10 MED ORDER — MORPHINE SULFATE (PF) 2 MG/ML IV SOLN: 0.5000 mg | INTRAVENOUS | Status: DC | PRN

## 2023-04-10 MED ORDER — DEXAMETHASONE SODIUM PHOSPHATE 10 MG/ML IJ SOLN
INTRAMUSCULAR | Status: DC | PRN
  Administered 2023-04-10: 5 mg via INTRAVENOUS

## 2023-04-10 MED ORDER — PROPOFOL 10 MG/ML IV BOLUS
INTRAVENOUS | Status: DC | PRN
  Administered 2023-04-10: 70 mg via INTRAVENOUS

## 2023-04-10 MED ORDER — POVIDONE-IODINE 10 % EX SWAB: 2.0000 | Freq: Once | CUTANEOUS | Status: DC

## 2023-04-10 MED ORDER — CHLORHEXIDINE GLUCONATE 0.12 % MT SOLN
OROMUCOSAL | Status: AC
  Administered 2023-04-10: 15 mL
  Filled 2023-04-10: qty 15

## 2023-04-10 MED ORDER — OXYCODONE HCL 5 MG/5ML PO SOLN: 5.0000 mg | Freq: Once | ORAL | Status: DC | PRN

## 2023-04-10 MED ORDER — METHOCARBAMOL 500 MG PO TABS
500.0000 mg | ORAL_TABLET | Freq: Four times a day (QID) | ORAL | Status: DC | PRN
  Administered 2023-04-10 – 2023-04-14 (×5): 500 mg via ORAL
  Filled 2023-04-10 (×5): qty 1

## 2023-04-10 MED ORDER — SCOPOLAMINE 1 MG/3DAYS TD PT72
1.0000 | MEDICATED_PATCH | Freq: Once | TRANSDERMAL | Status: AC
  Administered 2023-04-10: 1.5 mg via TRANSDERMAL
  Filled 2023-04-10: qty 1

## 2023-04-10 MED ORDER — OXYCODONE HCL 5 MG PO TABS: 5.0000 mg | ORAL_TABLET | Freq: Once | ORAL | Status: DC | PRN

## 2023-04-10 MED ORDER — VANCOMYCIN HCL 1000 MG IV SOLR
INTRAVENOUS | Status: DC | PRN
Start: 2023-04-10 — End: 2023-04-10
  Administered 2023-04-10: 1000 mg

## 2023-04-10 MED ORDER — PHENYLEPHRINE 80 MCG/ML (10ML) SYRINGE FOR IV PUSH (FOR BLOOD PRESSURE SUPPORT)
PREFILLED_SYRINGE | INTRAVENOUS | Status: DC | PRN
  Administered 2023-04-10: 80 ug via INTRAVENOUS
  Administered 2023-04-10 (×4): 160 ug via INTRAVENOUS

## 2023-04-10 MED ORDER — SENNOSIDES-DOCUSATE SODIUM 8.6-50 MG PO TABS
1.0000 | ORAL_TABLET | Freq: Every evening | ORAL | Status: DC | PRN
  Administered 2023-04-11: 1 via ORAL
  Filled 2023-04-10: qty 1

## 2023-04-10 MED ORDER — ACETAMINOPHEN 10 MG/ML IV SOLN
INTRAVENOUS | Status: DC | PRN
Start: 2023-04-10 — End: 2023-04-12
  Administered 2023-04-10: 1000 mg via INTRAVENOUS

## 2023-04-10 MED ORDER — FENTANYL CITRATE PF 50 MCG/ML IJ SOSY: 50.0000 ug | PREFILLED_SYRINGE | INTRAMUSCULAR | Status: DC | PRN

## 2023-04-10 MED ORDER — LACTATED RINGERS IV SOLN
INTRAVENOUS | Status: DC | PRN
Start: 2023-04-10 — End: 2023-04-12

## 2023-04-10 MED ORDER — SODIUM CHLORIDE 0.9 % IV SOLN
1.0000 g | INTRAVENOUS | Status: DC
  Administered 2023-04-10 – 2023-04-12 (×3): 1 g via INTRAVENOUS
  Filled 2023-04-10 (×3): qty 10

## 2023-04-10 MED ORDER — VANCOMYCIN HCL 1000 MG IV SOLR
INTRAVENOUS | Status: AC
  Filled 2023-04-10: qty 20

## 2023-04-10 MED ORDER — FENTANYL CITRATE (PF) 100 MCG/2ML IJ SOLN: 25.0000 ug | INTRAMUSCULAR | Status: DC | PRN

## 2023-04-10 SURGICAL SUPPLY — 71 items
BAG COUNTER SPONGE SURGICOUNT (BAG) ×1 IMPLANT
BAG SPNG CNTER NS LX DISP (BAG) ×1
BLADE SAGITTAL (BLADE) ×1
BLADE SAGITTAL WIDE XTHICK NO (BLADE) IMPLANT
BLADE SAW THK.89X75X18XSGTL (BLADE) ×1 IMPLANT
CEMENT BONE RALLY HV 40G (Cement) IMPLANT
CLSR STERI-STRIP ANTIMIC 1/2X4 (GAUZE/BANDAGES/DRESSINGS) IMPLANT
COVER PERINEAL POST (MISCELLANEOUS) ×1 IMPLANT
COVER SURGICAL LIGHT HANDLE (MISCELLANEOUS) ×1 IMPLANT
DRAPE HIP W/POCKET STRL (MISCELLANEOUS) ×1 IMPLANT
DRAPE IMP U-DRAPE 54X76 (DRAPES) ×1 IMPLANT
DRAPE INCISE IOBAN 85X60 (DRAPES) IMPLANT
DRAPE ORTHO SPLIT 77X108 STRL (DRAPES) ×2
DRAPE POUCH INSTRU U-SHP 10X18 (DRAPES) ×1 IMPLANT
DRAPE STERI IOBAN 125X83 (DRAPES) ×1 IMPLANT
DRAPE SURG ORHT 6 SPLT 77X108 (DRAPES) ×2 IMPLANT
DRAPE U-SHAPE 47X51 STRL (DRAPES) ×1 IMPLANT
DRESSING AQUACEL AG SP 3.5X10 (GAUZE/BANDAGES/DRESSINGS) IMPLANT
DRSG AQUACEL AG ADV 3.5X10 (GAUZE/BANDAGES/DRESSINGS) IMPLANT
DRSG AQUACEL AG SP 3.5X10 (GAUZE/BANDAGES/DRESSINGS) ×1
DRSG MEPILEX POST OP 4X8 (GAUZE/BANDAGES/DRESSINGS) ×1 IMPLANT
DURAPREP 26ML APPLICATOR (WOUND CARE) ×2 IMPLANT
ELECT CAUTERY BLADE 6.4 (BLADE) ×1 IMPLANT
ELECT REM PT RETURN 9FT ADLT (ELECTROSURGICAL) ×1
ELECTRODE REM PT RTRN 9FT ADLT (ELECTROSURGICAL) ×1 IMPLANT
EVACUATOR 1/8 PVC DRAIN (DRAIN) IMPLANT
FACESHIELD WRAPAROUND (MASK) IMPLANT
FACESHIELD WRAPAROUND OR TEAM (MASK) IMPLANT
GLOVE BIOGEL PI IND STRL 7.0 (GLOVE) ×2 IMPLANT
GLOVE BIOGEL PI IND STRL 7.5 (GLOVE) ×1 IMPLANT
GLOVE ECLIPSE 7.0 STRL STRAW (GLOVE) ×1 IMPLANT
GLOVE SKINSENSE STRL SZ7.5 (GLOVE) ×2 IMPLANT
GLOVE SURG SYN 7.5 E (GLOVE) ×2 IMPLANT
GLOVE SURG SYN 7.5 PF PI (GLOVE) ×2 IMPLANT
GLOVE SURG UNDER POLY LF SZ7 (GLOVE) ×19 IMPLANT
GLOVE SURG UNDER POLY LF SZ7.5 (GLOVE) ×4 IMPLANT
GOWN STRL SURGICAL XL XLNG (GOWN DISPOSABLE) ×1 IMPLANT
HANDPIECE INTERPULSE COAX TIP (DISPOSABLE) ×1
HEAD BIPOLAR 45MM (Hips) IMPLANT
HEAD FEM ANTH 28 +8 (Hips) IMPLANT
HOOD PEEL AWAY T7 (MISCELLANEOUS) ×2 IMPLANT
KIT BASIN OR (CUSTOM PROCEDURE TRAY) ×1 IMPLANT
KIT PREP HIP W/CEMENT RESTRICT (Miscellaneous) IMPLANT
KIT TURNOVER KIT B (KITS) ×1 IMPLANT
MANIFOLD NEPTUNE II (INSTRUMENTS) ×1 IMPLANT
NS IRRIG 1000ML POUR BTL (IV SOLUTION) ×1 IMPLANT
PACK TOTAL JOINT (CUSTOM PROCEDURE TRAY) ×1 IMPLANT
PACK UNIVERSAL I (CUSTOM PROCEDURE TRAY) ×1 IMPLANT
PAD ARMBOARD 7.5X6 YLW CONV (MISCELLANEOUS) ×2 IMPLANT
SET HNDPC FAN SPRY TIP SCT (DISPOSABLE) IMPLANT
STAPLER VISISTAT 35W (STAPLE) IMPLANT
STEM CENTR INVIS DIST FEM 9 (Stem) IMPLANT
STEM HEAD CMT FEM SYNERGY 11 (Head) IMPLANT
SUT DVC VLOC 180 0 12IN GS21 (SUTURE) ×1
SUT ETHIBOND 2 V 37 (SUTURE) ×1 IMPLANT
SUT ETHIBOND NAB CT1 #1 30IN (SUTURE) IMPLANT
SUT MNCRL AB 3-0 PS2 18 (SUTURE) IMPLANT
SUT PDS AB 1 CT 36 (SUTURE) ×2 IMPLANT
SUT VIC AB 0 CT1 27 (SUTURE)
SUT VIC AB 0 CT1 27XBRD ANBCTR (SUTURE) IMPLANT
SUT VIC AB 0 CT1 36 (SUTURE) IMPLANT
SUT VIC AB 1 CTB1 27 (SUTURE) IMPLANT
SUT VIC AB 2-0 CT1 27 (SUTURE)
SUT VIC AB 2-0 CT1 TAPERPNT 27 (SUTURE) IMPLANT
SUT VIC AB 2-0 FS1 27 (SUTURE) IMPLANT
SUT VLOC 180 0 6IN GS21 (SUTURE) IMPLANT
SUT VLOC 90 3-0 CLR P12 (SUTURE) IMPLANT
SUTURE DVC VLC 180 0 12IN GS21 (SUTURE) IMPLANT
TOWEL GREEN STERILE (TOWEL DISPOSABLE) ×1 IMPLANT
TOWEL GREEN STERILE FF (TOWEL DISPOSABLE) ×1 IMPLANT
TOWER CARTRIDGE SMART MIX (DISPOSABLE) IMPLANT

## 2023-04-10 NOTE — Op Note (Signed)
Operative Note  Gabrielle Hoffman Deshler  Surgery Date: 04/09/2023 - 04/10/2023 Surgeon: Rosalee Kaufman, MD Assistant(s): None  Preop Diagnosis(es):  Left hip displaced femoral neck fracture Urinary tract infection Dementia Heart failure with pacemaker  Postop Diagnosis(es):  Left hip displaced femoral neck fracture Urinary tract infection Dementia Heart failure with pacemaker  Operative Procedure(s): Left hip hemiarthroplasty  Anesthesia: The patient had administration of general anesthesia. Further details can be found in the anesthesia record.  Estimated Blood Loss: 150 mL  Complications:  None noted intraoperatively  Drains: None  Implants:  [employer] cemented size 11 Synergy stem with 45mm bipolar head. Full detailed list below.  Indications:  Gabrielle Hoffman is a 89 y.o. year old female who presented to the emergency department after ground-level fall, found to have a displaced left femoral neck fracture.  This discussed with her and her son treatment options including nonoperative and left hip hemiarthroplasty.  We discussed the high risk of complications given her age and comorbidities.  After thorough discussion of the risks and benefits of surgical management and alternative nonoperative treatment options, they elected to proceed with surgical treatment. Risks and complications were discussed and understood including, but not limited to, bleeding, infection, stiffness, numbness, damage to surrounding structures (including blood vessels and nerves), failure of the procedure, need for secondary or revision procedures, failure of healing, incomplete functional recovery, and worsening or chronic pain. Additional risks pertinent to the surgery and anesthesia also include pulmonary compromise, blood clots/pulmonary embolism, cardiac complications, and death. No guarantees were stated or implied. All questions were answered to the best of my ability and the patient verbalized  understanding.    Description of Procedure:  The patient was identified in the holding area, taken to the operating room and underwent successful induction of anesthesia. They were then placed on the operating table in a lateral position, with all bony prominences well padded. Preprocedure antibiotics were administered. A time out was performed and all parties were in agreement with the patient identification, surgical site and planned procedure. The extremity was then prepped and draped in usual sterile fashion. A second time out was performed prior to incision, once again confirming the patient, site, procedure and expectations of surgery.  We centered our incision over the greater trochanter, extending proximally 5 cm and distally along the shaft of the femur.  Hemostasis was obtained as the subcutaneous layers and adipose was divided using electrocautery.  The iliotibial band was identified and visualized along the length of the incision.  This was incised along the femur, trochanter and extending proximally.  We then placed a Charnley retractor and excised all bursal tissue.  This gave [place] a good view of the origin of the vastus lateralis and the insertion of the gluteus medius.  The gluteus medius was bluntly divided at the the 40% anterior, 60% posterior mark and this split was taken down to the greater trochanter.  This tendinous portion of the inferior aspect of the gluteus medius was then released from the trochanter with electrocautery and tagged with a #2 FiberWire suture.  We then performed a T capsulotomy, peeling the gluteus minimus tendon and capsule in 1 sleeve and extending distally to the origin of the vastus lateralis.  The sleeve of tissue was also tagged with a #2 FiberWire.  Superiorly, a small segment of capsule was excised and the remaining superior aspect of the gluteus medius was retracted superiorly.  At this point we had good visualization of the fracture and lesser  trochanter.   The leg was flexed and externally rotated delivering the fracture out of the wound.  Using a cutting guide and based on our preoperative template and landmarks of the lesser and greater trochanter, we marked our neck cut.  This neck cut was then performed using an oscillating saw.  We then turned our attention to the acetabulum and using a corkscrew device the femoral head was removed.  This was measured to be 45 mm.  The acetabulum was inspected and noted to have adequate cartilage coverage and integrity.  We trialed a 45 mm head trial which seemed to be the appropriate size.  The acetabulum was irrigated to ensure removal of any fracture debris and soft tissue.  We then began preparation of the femur which was performed using a box osteotome followed by a canal finder and sequential diaphyseal reamers on hand up to a size 11.  Broaching was then performed in a sequential manner also up to a size 11.  The +4 head and 45 mm liner trials were placed and the hip was reduced.  It was felt to have adequate stability and soft tissue tension as well as equal leg lengths.  The hip was dislocated and the trials and broach were removed.  We prepared the femur for cementation including irrigation, placement of a cement restrictor and drying of the canal.  A sponge was placed into the acetabulum.  Cement was then placed in the canal and gently pressurized.  A size 11 stem was placed ensuring adequate coronal and axial alignment, matching their native anteversion.  Once the cement had hardened, we placed the +8 head and 45 mm liner trials, removed the sponge from the acetabulum and again reduced the hip.  The soft tissue tension was appropriate, clinically the leg lengths were appropriate, the hip was stable in both flexion, adduction, internal rotation and in extension with external rotation.  A flatplate radiograph also verified appropriate leg lengths and stem positioning.  We then dislocated the hip, removed the trials  and placed the final head and liner implants.  The acetabulum was irrigated one last time and inspected for any debris.  The hip was again reduced and taken through range of motion showing appropriate soft tissue tension and no instability.  The wound was irrigated using 3 L of normal saline.  We then began our closure beginning with the previously tagged capsule and gluteus minimus tendon which was closed using #2 FiberWire.  The inferior aspect of the gluteus medius was then closed using #2 FiberWire.  The distal vastus lateralis split was closed using 0 Vicryl.  The IT band was closed using a combination of 0 Vicryl and strata fix.  The skin was closed using 2-0 Vicryl and a running 3-0 Monocryl.  A sterile dressing was placed and she was transferred to the PACU in stable condition.   All counts at the end of the case were correct.  Post-Operative Condition: The patient was transferred to the PACU in stable condition.  Post-Operative Plan: They will be weightbearing as tolerated.  They will follow our hip fracture rehabilitation protocol.  DVT chemoprophylaxis will be with aspirin 81 twice daily.  We will see them back in 10 days for a wound check and for further review of surgical findings.  Implant Record:  Implant Name Type Inv. Item Serial No. Manufacturer Lot No. LRB No. Used Action  28mm OD  12/14 Taper Femoral Head     [id number] Left 1 Implanted  Cemented  Stem Femoral Component     [id number] Left 1 Implanted  Distal Post Centralizer 9mm     [id number] Left 1 Implanted  HEAD BIPOLAR - [id number] Hips HEAD BIPOLAR  [employer] [id number] Left 1 Implanted  STENT WALLSTENT  [id number] - [id number] Permanent Stent STENT WALLSTENT  [id number]  [employer] [id number] Left 1 Implanted  40Grams Rally HV Bone Cement     [id number] Left 2 Implanted

## 2023-04-10 NOTE — Plan of Care (Signed)
  Problem: Education: Goal: Knowledge of General Education information will improve Description: Including pain rating scale, medication(s)/side effects and non-pharmacologic comfort measures Outcome: Progressing   Problem: Clinical Measurements: Goal: Ability to maintain clinical measurements within normal limits will improve Outcome: Progressing Goal: Will remain free from infection Outcome: Progressing   

## 2023-04-10 NOTE — Anesthesia Procedure Notes (Addendum)
Procedure Name: Intubation Date/Time: 04/10/2023 6:19 PM  Performed by: Sharyn Dross, CRNAPre-anesthesia Checklist: Patient identified, Emergency Drugs available, Suction available and Patient being monitored Patient Re-evaluated:Patient Re-evaluated prior to induction Oxygen Delivery Method: Circle system utilized Preoxygenation: Pre-oxygenation with 100% oxygen Induction Type: IV induction Ventilation: Mask ventilation without difficulty Laryngoscope Size: Mac and 4 Grade View: Grade II Tube type: Oral Tube size: 7.0 mm Number of attempts: 1 Airway Equipment and Method: Stylet and Oral airway Placement Confirmation: ETT inserted through vocal cords under direct vision, positive ETCO2 and breath sounds checked- equal and bilateral Secured at: 21 cm Tube secured with: Tape Dental Injury: Teeth and Oropharynx as per pre-operative assessment

## 2023-04-10 NOTE — Progress Notes (Signed)
Orthopaedic Surgery Progress Note  Comfortable on the floor this morning.  Hip pain is controlled.  No other questions this morning.  Was unsure that she had a broken hip.  Exam: Left lower extremity: Dressings clean dry and intact Intact ankle dorsiflexion plantarflexion Sensation grossly intact to light touch Foot is warm and well-perfused  Assessment: 89 y.o. female Day of Surgery status post left hip hemiarthroplasty for displaced femoral neck fracture  Plan: Weightbearing as tolerated with a walker, no hip precautions Dressing: Leave Aquacel in place until follow-up, okay to change with regular dry gauze if it becomes soiled OK to resume DVT chemoppx or anticoagulation on post op day 1; recommend 4 weeks of chemoppx with ASA 81 BID if no other AC is in place Perioperative ancef for 24 PT beginning on POD#1 Hemoglobin 10.5 this morning, continue to monitor Discharge instructions placed in Epic

## 2023-04-10 NOTE — Progress Notes (Signed)
OT Cancellation Note  Patient Details Name: Gabrielle Hoffman MRN: [id number] DOB: 1931/02/04   Cancelled Treatment:    Reason Eval/Treat Not Completed: (P) Other (comment)pt admitted with L hip fx and awaiting ortho consult.Will hold evaluation until plan is determined by MD.   Alexis Goodell 04/10/2023, 9:36 AM

## 2023-04-10 NOTE — Progress Notes (Signed)
Initial Nutrition Assessment  DOCUMENTATION CODES:   Not applicable  INTERVENTION:  Order Ensure Plus High Protein po BID, each supplement provides 350 kcal and 20 grams of protein when diet has been upgraded  NUTRITION DIAGNOSIS:   Inadequate oral intake related to inability to eat as evidenced by NPO status.  GOAL:   Patient will meet greater than or equal to 90% of their needs   MONITOR:   PO intake, Supplement acceptance, Labs, Weight trends, Skin, I & O's  REASON FOR ASSESSMENT:   Consult Hip fracture protocol  ASSESSMENT:   89 y.o. female with PMHx including dementia, HLD, HTN, hyponatremia, asthma, DJD lumbar spine, depression who presents after a mechanical fall with hip pain. Patient admitted for hip fracture  Visited patient at bedside who reports good appetite at baseline. She denies issues chewing/swallowing. She denies N/V/D/C. Patient is agreeable to ONS BID when diet is upgraded.   Patient is NPO for L hemiarthroplasty today   Labs: Glu 160 Meds: ancef, rocephin, protonix, vancocin, NS   Wt: no admit wt documented, RD unable to determine weight changes  03/17/23 49.9 kg  09/15/22 51.3 kg  02/25/22 52 kg  10/22/21 48.5 kg   PO: NPO   I/O's:  none documented at this time   NUTRITION - FOCUSED PHYSICAL EXAM:  Flowsheet Row Most Recent Value  Orbital Region Moderate depletion  Upper Arm Region Unable to assess  Thoracic and Lumbar Region Unable to assess  Buccal Region Moderate depletion  Temple Region Moderate depletion  Clavicle Bone Region Moderate depletion  Clavicle and Acromion Bone Region Moderate depletion  Scapular Bone Region Unable to assess  Dorsal Hand Unable to assess  Patellar Region Moderate depletion  Anterior Thigh Region Moderate depletion  Posterior Calf Region Moderate depletion  Edema (RD Assessment) None  Hair Reviewed  Eyes Reviewed  Mouth Reviewed  Skin Reviewed  Nails Reviewed       Diet Order:   Diet Order      None       EDUCATION NEEDS:   Education needs have been addressed  Skin:  Skin Assessment: Reviewed RN Assessment  Last BM:  9/5  Height:   Ht Readings from Last 1 Encounters:  04/09/23 5\' 6"  (1.676 m)    Weight:   Wt Readings from Last 1 Encounters:  03/17/23 49.9 kg    Ideal Body Weight:     BMI:  Body mass index is 17.75 kg/m.  Estimated Nutritional Needs:   Kcal:  1500-1750  Protein:  60-75 g  Fluid:  >/= 1.7 L   Leodis Rains, RDN, LDN  Clinical Nutrition

## 2023-04-10 NOTE — ED Notes (Signed)
Pt being transported to floor by NT. Pt in no acute distress at this time. Pt on tele monitor.

## 2023-04-10 NOTE — Progress Notes (Signed)
PT Cancellation Note  Patient Details Name: Gabrielle Hoffman MRN: [id number] DOB: Mar 20, 1931   Cancelled Treatment:    Reason Eval/Treat Not Completed: Other (comment) pt admitted with L hip fx and awaiting ortho consult. PT will hold evaluation until plan is determined by MD.   Vickki Muff, PT, DPT   Acute Rehabilitation Department Office [phone] Secure Chat Communication Preferred   Ronnie Derby 04/10/2023, 7:54 AM

## 2023-04-10 NOTE — Progress Notes (Signed)
1600 Spoke with pt's son Vonna Kotyk. Agrees with blood transfusion if needed. Pt is pleasantly confused.

## 2023-04-10 NOTE — H&P (Addendum)
History and Physical    [name] PPI:[id number] DOB: 1930/09/22 DOA: 04/09/2023  PCP: Farris Has, MD  Patient coming from: home  I have personally briefly reviewed patient's old medical records in [facility]  Chief Complaint:  mechanical fall with left hip pain  HPI: Gabrielle Hoffman is a 89 y.o. female with medical history significant of  Dementia,HLD, HTN, hyponatremia,asthma, DJD lumbar spine, depression who presents to ED have mechanical fall at home with resulting hip pain. Per family patient came in from a walk and tripped on a rug. Patient currently states mild pain with lying still however with movement has intense pain in left leg.  ON ros she notes no chest pain, n/v//d/abdominal pain/ fever or chills.   ED Course:  Afeb, bp 189/83, hr 103, rr 12, sat 100% on ra   Labs: Wbc 13.7, hgb 11.6 at baseline, plt 344 Na 137,K 3.8, glu 160, cr 0.87 Cxr: NAD Left knee: IMPRESSION: No acute displaced fracture or dislocation. CTH/cervical spine IMPRESSION: 1. No acute intracranial abnormality. 2. No acute displaced fracture or traumatic listhesis of the cervical spine.  Left hip IMPRESSION: Acute superiorly displaced left femoral neck fracture.   UA:+nitrite, wbc 6-10  Tx  fentanyl  Review of Systems: As per HPI otherwise 10 point review of systems negative.   Past Medical History:  Diagnosis Date   Asthma    DDD (degenerative disc disease), lumbar    Dementia    very mild    Depression    Disc disorder of cervical region    Diverticulosis    Hyperlipidemia    Hypertension    Hyponatremia 12/17/2011   Kidney stones    "passed them"   Memory loss    Mitral valve prolapse    Osteopenia    Pneumonia    "I believe I had it a long time ago"   Presence of permanent cardiac pacemaker    Syncope    UTI (lower urinary tract infection)    Vaginal prolapse    Venous insufficiency     Past Surgical History:  Procedure Laterality Date   ABDOMINAL  HYSTERECTOMY     APPENDECTOMY     EP IMPLANTABLE DEVICE N/A 08/27/2015   Procedure: Loop Recorder Insertion;  Surgeon: Marinus Maw, MD;  Location: [facility] INVASIVE CV LAB;  Service: Cardiovascular;  Laterality: N/A;   EP IMPLANTABLE DEVICE N/A 11/19/2015   Procedure: Pacemaker Implant;  Surgeon: Marinus Maw, MD;  Location: [facility] INVASIVE CV LAB;  Service: Cardiovascular;  Laterality: N/A;   INSERT / REPLACE / REMOVE PACEMAKER  11/19/2015   TONSILLECTOMY       reports that she has never smoked. She has never used smokeless tobacco. She reports that she does not drink alcohol and does not use drugs.  Allergies  Allergen Reactions   Carvedilol Other (See Comments), Itching and Rash   Sulfa Antibiotics Rash   Sulfamethoxazole Rash    Family History  Problem Relation Age of Onset   Cancer Sister     Prior to Admission medications   Medication Sig Start Date End Date Taking? Authorizing Provider  bisoprolol (ZEBETA) 5 MG tablet TAKE 1/2 (ONE-HALF) TABLET BY MOUTH AT BEDTIME 10/23/22   Sheilah Pigeon, PA-C  ibuprofen (ADVIL) 200 MG tablet Take 200 mg by mouth every 6 (six) hours as needed for moderate pain.    [provider]  methimazole (TAPAZOLE) 5 MG tablet Take 2.5 mg by mouth daily. 07/29/22   [provider]  nitrofurantoin, macrocrystal-monohydrate, (MACROBID) 100 MG capsule Take 100 mg by mouth daily.    [provider]  ondansetron (ZOFRAN) 8 MG tablet 1 tablet as needed 07/30/20   [provider]  pantoprazole (PROTONIX) 40 MG tablet Take 1 tablet (40 mg total) by mouth daily before supper. 06/30/20   Ollen Bowl, MD    Physical Exam: Vitals:   04/09/23 2253 04/09/23 2258 04/09/23 2306 04/10/23 0153  BP:   (!) 189/83 (!) 153/64  Pulse:   (!) 103 93  Resp:   12 18  Temp:   98.4 F (36.9 C) (!) 97.5 F (36.4 C)  TempSrc:   Oral Oral  SpO2: 98%  100% 99%  Height:  5\' 6"  (1.676 m)      Constitutional: NAD, calm,  comfortable Vitals:   04/09/23 2253 04/09/23 2258 04/09/23 2306 04/10/23 0153  BP:   (!) 189/83 (!) 153/64  Pulse:   (!) 103 93  Resp:   12 18  Temp:   98.4 F (36.9 C) (!) 97.5 F (36.4 C)  TempSrc:   Oral Oral  SpO2: 98%  100% 99%  Height:  5\' 6"  (1.676 m)     Eyes: PERRL, lids and conjunctivae normal ENMT: Mucous membranes are moist. Posterior pharynx clear of any exudate or lesions.Normal dentition.  Neck: normal, supple, no masses, no thyromegaly Respiratory: clear to auscultation bilaterally, no wheezing, no crackles. Normal respiratory effort. No accessory muscle use.  Cardiovascular: Regular rate and rhythm, no murmurs / rubs / gallops. No extremity edema. 2+ pedal pulses. No carotid bruits.  Abdomen: no tenderness, no masses palpated. No hepatosplenomegaly. Bowel sounds positive.  Musculoskeletal: no clubbing / cyanosis. No joint deformity upper and lower extremities. Good ROM, no contractures. Normal muscle tone.  Skin: no rashes, lesions, ulcers. No induration Neurologic: CN 2-12 grossly intact. Sensation intact, DTR normal. Strength 5/5 in all 4.  Psychiatric: Normal judgment and insight. Alert and oriented x 3. Normal mood.    Labs on Admission: I have personally reviewed following labs and imaging studies  CBC: Recent Labs  Lab 04/10/23 0001  WBC 13.7*  NEUTROABS 11.5*  HGB 11.6*  HCT 35.3*  MCV 93.1  PLT 344   Basic Metabolic Panel: Recent Labs  Lab 04/10/23 0001  NA 137  K 3.8  CL 97*  CO2 24  GLUCOSE 160*  BUN 16  CREATININE 0.87  CALCIUM 9.7   GFR: CrCl cannot be calculated (Unknown ideal weight.). Liver Function Tests: No results for input(s): "AST", "ALT", "ALKPHOS", "BILITOT", "PROT", "ALBUMIN" in the last 168 hours. No results for input(s): "LIPASE", "AMYLASE" in the last 168 hours. No results for input(s): "AMMONIA" in the last 168 hours. Coagulation Profile: No results for input(s): "INR", "PROTIME" in the last 168 hours. Cardiac  Enzymes: No results for input(s): "CKTOTAL", "CKMB", "CKMBINDEX", "TROPONINI" in the last 168 hours. BNP (last 3 results) No results for input(s): "PROBNP" in the last 8760 hours. HbA1C: No results for input(s): "HGBA1C" in the last 72 hours. CBG: No results for input(s): "GLUCAP" in the last 168 hours. Lipid Profile: No results for input(s): "CHOL", "HDL", "LDLCALC", "TRIG", "CHOLHDL", "LDLDIRECT" in the last 72 hours. Thyroid Function Tests: No results for input(s): "TSH", "T4TOTAL", "FREET4", "T3FREE", "THYROIDAB" in the last 72 hours. Anemia Panel: No results for input(s): "VITAMINB12", "FOLATE", "FERRITIN", "TIBC", "IRON", "RETICCTPCT" in the last 72 hours. Urine analysis:    Component Value Date/Time   COLORURINE STRAW (A) 06/27/2020 1613   APPEARANCEUR CLEAR 06/27/2020  1613   LABSPEC 1.004 (L) 06/27/2020 1613   PHURINE 7.0 06/27/2020 1613   GLUCOSEU NEGATIVE 06/27/2020 1613   HGBUR NEGATIVE 06/27/2020 1613   BILIRUBINUR NEGATIVE 06/27/2020 1613   KETONESUR NEGATIVE 06/27/2020 1613   PROTEINUR NEGATIVE 06/27/2020 1613   UROBILINOGEN 0.2 06/17/2015 2338   NITRITE NEGATIVE 06/27/2020 1613   LEUKOCYTESUR TRACE (A) 06/27/2020 1613    Radiological Exams on Admission: CT Head Wo Contrast  Result Date: 04/10/2023 CLINICAL DATA:  Head trauma, minor (Age >= 65y); Neck trauma (Age >= 65y). Fall EXAM: CT HEAD WITHOUT CONTRAST CT CERVICAL SPINE WITHOUT CONTRAST TECHNIQUE: Multidetector CT imaging of the head and cervical spine was performed following the standard protocol without intravenous contrast. Multiplanar CT image reconstructions of the cervical spine were also generated. RADIATION DOSE REDUCTION: This exam was performed according to the departmental dose-optimization program which includes automated exposure control, adjustment of the mA and/or kV according to patient size and/or use of iterative reconstruction technique. COMPARISON:  CT head 09/01/2017, CT C-spine 06/17/2015  FINDINGS: CT HEAD FINDINGS Brain: Cerebral ventricle sizes are concordant with the degree of cerebral volume loss. Patchy and confluent areas of decreased attenuation are noted throughout the deep and periventricular white matter of the cerebral hemispheres bilaterally, compatible with chronic microvascular ischemic disease. Right occipital encephalomalacia. No evidence of large-territorial acute infarction. No parenchymal hemorrhage. No mass lesion. No extra-axial collection. No mass effect or midline shift. No hydrocephalus. Basilar cisterns are patent. Vascular: No hyperdense vessel. Atherosclerotic calcifications are present within the cavernous internal carotid and vertebral arteries. Skull: No acute fracture or focal lesion. Bilateral temporomandibular joint degenerative changes. Sinuses/Orbits: Paranasal sinuses and mastoid air cells are clear. Bilateral lens replacement. Otherwise the orbits are unremarkable. Other: None. CT CERVICAL SPINE FINDINGS Alignment: Normal. Skull base and vertebrae: Multilevel moderate degenerative changes of the spine with intervertebral disc space vacuum phenomenon head the C5-C6 level as well as posterior disc osteophyte complex formation. No associated severe osseous neural foraminal or central canal stenosis. No acute fracture. No aggressive appearing focal osseous lesion or focal pathologic process. Soft tissues and spinal canal: No prevertebral fluid or swelling. No visible canal hematoma. Upper chest: Biapical pleural/pulmonary scarring. Other: Atherosclerotic plaque of the carotid arteries within the neck. IMPRESSION: 1. No acute intracranial abnormality. 2. No acute displaced fracture or traumatic listhesis of the cervical spine. Electronically Signed   By: Tish Frederickson M.D.   On: 04/10/2023 00:33   CT Cervical Spine Wo Contrast  Result Date: 04/10/2023 CLINICAL DATA:  Head trauma, minor (Age >= 65y); Neck trauma (Age >= 65y). Fall EXAM: CT HEAD WITHOUT CONTRAST CT  CERVICAL SPINE WITHOUT CONTRAST TECHNIQUE: Multidetector CT imaging of the head and cervical spine was performed following the standard protocol without intravenous contrast. Multiplanar CT image reconstructions of the cervical spine were also generated. RADIATION DOSE REDUCTION: This exam was performed according to the departmental dose-optimization program which includes automated exposure control, adjustment of the mA and/or kV according to patient size and/or use of iterative reconstruction technique. COMPARISON:  CT head 09/01/2017, CT C-spine 06/17/2015 FINDINGS: CT HEAD FINDINGS Brain: Cerebral ventricle sizes are concordant with the degree of cerebral volume loss. Patchy and confluent areas of decreased attenuation are noted throughout the deep and periventricular white matter of the cerebral hemispheres bilaterally, compatible with chronic microvascular ischemic disease. Right occipital encephalomalacia. No evidence of large-territorial acute infarction. No parenchymal hemorrhage. No mass lesion. No extra-axial collection. No mass effect or midline shift. No hydrocephalus. Basilar cisterns are patent. Vascular:  No hyperdense vessel. Atherosclerotic calcifications are present within the cavernous internal carotid and vertebral arteries. Skull: No acute fracture or focal lesion. Bilateral temporomandibular joint degenerative changes. Sinuses/Orbits: Paranasal sinuses and mastoid air cells are clear. Bilateral lens replacement. Otherwise the orbits are unremarkable. Other: None. CT CERVICAL SPINE FINDINGS Alignment: Normal. Skull base and vertebrae: Multilevel moderate degenerative changes of the spine with intervertebral disc space vacuum phenomenon head the C5-C6 level as well as posterior disc osteophyte complex formation. No associated severe osseous neural foraminal or central canal stenosis. No acute fracture. No aggressive appearing focal osseous lesion or focal pathologic process. Soft tissues and  spinal canal: No prevertebral fluid or swelling. No visible canal hematoma. Upper chest: Biapical pleural/pulmonary scarring. Other: Atherosclerotic plaque of the carotid arteries within the neck. IMPRESSION: 1. No acute intracranial abnormality. 2. No acute displaced fracture or traumatic listhesis of the cervical spine. Electronically Signed   By: Tish Frederickson M.D.   On: 04/10/2023 00:33   DG Hip Port [place] W or [place] Pelvis 1 View Left  Result Date: 04/10/2023 CLINICAL DATA:  pain.  Fall EXAM: DG HIP (WITH OR WITHOUT PELVIS) 1V PORT LEFT COMPARISON:  None Available. FINDINGS: Acute superiorly displaced left femoral neck fracture. No left hip dislocation. Frontal view of the right hip demonstrates no acute displaced fracture or dislocation. No acute displaced fracture or diastasis of the bones of the pelvis- Limited evaluation due to overlapping osseous structures and overlying soft tissues. There is no evidence of arthropathy or other focal bone abnormality. IMPRESSION: Acute superiorly displaced left femoral neck fracture. Electronically Signed   By: Tish Frederickson M.D.   On: 04/10/2023 00:25   DG Knee Left Port  Result Date: 04/10/2023 CLINICAL DATA:  pain.  Fall EXAM: PORTABLE LEFT KNEE - 1-2 VIEW COMPARISON:  None Available. FINDINGS: No evidence of fracture, dislocation, or joint effusion. No evidence of severe arthropathy. No aggressive appearing focal bone abnormality. Soft tissues are unremarkable. Vascular calcification. IMPRESSION: No acute displaced fracture or dislocation. Electronically Signed   By: Tish Frederickson M.D.   On: 04/10/2023 00:16   DG Chest Portable 1 View  Result Date: 04/10/2023 CLINICAL DATA:  pain EXAM: PORTABLE CHEST 1 VIEW COMPARISON:  Chest x-ray 01/15/2021, CT chest 08/02/2020 FINDINGS: Dual lead left chest wall cardiac pacemaker. The heart and mediastinal contours are unchanged. Aortic calcification. Stable calcified right lower lower lung zone pulmonary nodules. No  focal consolidation. Chronic coarse interstitial markings with no overt pulmonary edema. No pleural effusion. No pneumothorax. No acute osseous abnormality. IMPRESSION: No active disease. Electronically Signed   By: Tish Frederickson M.D.   On: 04/10/2023 00:11    [id number] Assessment/Plan   Acute superiorly displaced left femoral neck fracture -admit to tele  -place on hip fracture protocol  -ortho consult called awaiting further recs -supportive with pain medications  -patient is cleared for surgery and is a low risk for low-intermediate surgery  3.9% 30 day risk of death, MI or cardiac arrest based on Cardiac risk index for pre-operative risk  -npo midnight   UTI -elevated wbc , abn UA -ctx  -send culture , de-escalate abx based on culture   Dementia -no active issues   HLD -diet controlled   Hyperthyrodism -continue on methimazole   HTN -resume bisoprolol  Asthma -stable no acute exacerbation  -prn nebs   Depression  -resume home regimen   GERD -ppi  DVT prophylaxis: scd Code Status: default full will need clarification in am  Family Communication: none at  bedside Disposition Plan: patient  expected to be admitted greater than 2 midnights  Consults called: Ortho Admission status: med tele   Lurline Del MD [facility]   If 7PM-7AM, please contact night-coverage [email or web address] Password [email or web address]  04/10/2023, 3:12 AM

## 2023-04-10 NOTE — Plan of Care (Signed)

## 2023-04-10 NOTE — Anesthesia Preprocedure Evaluation (Addendum)
Anesthesia Evaluation  Patient identified by MRN, date of birth, ID bandGeneral Assessment Comment: Awake, confused (baseline)   Reviewed: Allergy & Precautions, NPO status , Patient's Chart, lab work & pertinent test results, reviewed documented beta blocker date and time   History of Anesthesia Complications Negative for: history of anesthetic complications  Airway Mallampati: II  TM Distance: >3 FB Neck ROM: Full    Dental  (+) Dental Advisory Given   Pulmonary asthma    Pulmonary exam normal        Cardiovascular hypertension, Pt. on home beta blockers and Pt. on medications Normal cardiovascular exam+ dysrhythmias + pacemaker    '24 TTE - EF 55 to 60%. Grade I diastolic dysfunction (impaired relaxation). Aortic valve regurgitation is mild.     Neuro/Psych  PSYCHIATRIC DISORDERS  Depression   Dementia    GI/Hepatic ,GERD  Medicated and Controlled,,  Endo/Other   Hyperthyroidism   Renal/GU      Musculoskeletal  (+) Arthritis ,    Abdominal   Peds  Hematology   Anesthesia Other Findings   Reproductive/Obstetrics                             Anesthesia Physical Anesthesia Plan  ASA: 3  Anesthesia Plan: General   Post-op Pain Management: Tylenol PO (pre-op)*   Induction: Intravenous  PONV Risk Score and Plan: 3 and Treatment may vary due to age or medical condition, Ondansetron and TIVA  Airway Management Planned: Oral ETT  Additional Equipment: None  Intra-op Plan:   Post-operative Plan: Extubation in OR  Informed Consent: I have reviewed the patients History and Physical, chart, labs and discussed the procedure including the risks, benefits and alternatives for the proposed anesthesia with the patient or authorized representative who has indicated his/her understanding and acceptance.   Patient has DNR.  Discussed DNR with power of attorney and Suspend DNR.   Dental  advisory given  Plan Discussed with: CRNA and Anesthesiologist  Anesthesia Plan Comments: (Obtained consent via telephone call with son, Milus Banister. DNR reversed for procedure.)       Anesthesia Quick Evaluation

## 2023-04-10 NOTE — ED Notes (Signed)
ED TO INPATIENT HANDOFF REPORT  ED Nurse Name and Phone #: Barabara Motz/ [phone]  S Name/Age/Gender Gabrielle Hoffman 89 y.o. female Room/Bed: 022C/022C  Code Status   Code Status: Prior  Home/SNF/Other Home Patient oriented to: self and place Is this baseline? Yes   Triage Complete: Triage complete  Chief Complaint Hip fracture [facility]) [S72.009A]  Triage Note Pt BIB [facility] from home for a fall. Pt was at home in the kitchen and tripped over the rug and fell on her left side. Pt is not on blood thinners and did not hit her head. Pt has a hx of a fractured pelvis. Fall was witnessed by son. Pt has hx of dementia and a pacemaker.   EMS VS BP initial 220/100 182/86 HR 100 O2 98-100% RA CBG 176   Allergies Allergies  Allergen Reactions   Carvedilol Other (See Comments), Itching and Rash   Sulfa Antibiotics Rash   Sulfamethoxazole Rash    Level of Care/Admitting Diagnosis ED Disposition     ED Disposition  Admit   Condition  --   Comment  Hospital Area: [facility] [[id number]]  Level of Care: Telemetry Medical [104]  May admit patient to [facility] or [facility] if equivalent level of care is available:: No  Covid Evaluation: Asymptomatic - no recent exposure (last 10 days) testing not required  Diagnosis: Hip fracture [facility]) [[id number]]  Admitting Physician: Lurline Del [[id number]]  Attending Physician: Lurline Del [[id number]]  Certification:: I certify this patient will need inpatient services for at least 2 midnights  Expected Medical Readiness: 04/14/2023          B Medical/Surgery History Past Medical History:  Diagnosis Date   Asthma    DDD (degenerative disc disease), lumbar    Dementia    very mild    Depression    Disc disorder of cervical region    Diverticulosis    Hyperlipidemia    Hypertension    Hyponatremia 12/17/2011   Kidney stones    "passed them"   Memory loss    Mitral valve prolapse    Osteopenia     Pneumonia    "I believe I had it a long time ago"   Presence of permanent cardiac pacemaker    Syncope    UTI (lower urinary tract infection)    Vaginal prolapse    Venous insufficiency    Past Surgical History:  Procedure Laterality Date   ABDOMINAL HYSTERECTOMY     APPENDECTOMY     EP IMPLANTABLE DEVICE N/A 08/27/2015   Procedure: Loop Recorder Insertion;  Surgeon: Marinus Maw, MD;  Location: [facility] INVASIVE CV LAB;  Service: Cardiovascular;  Laterality: N/A;   EP IMPLANTABLE DEVICE N/A 11/19/2015   Procedure: Pacemaker Implant;  Surgeon: Marinus Maw, MD;  Location: [facility] INVASIVE CV LAB;  Service: Cardiovascular;  Laterality: N/A;   INSERT / REPLACE / REMOVE PACEMAKER  11/19/2015   TONSILLECTOMY       A IV Location/Drains/Wounds Patient Lines/Drains/Airways Status     Active Line/Drains/Airways     Name Placement date Placement time Site Days   Peripheral IV 04/09/23 20 G Right Antecubital 04/09/23  2253  Antecubital  1   External Urinary Catheter 06/26/20  0952  --  1018   Incision (Closed) 11/19/15 Chest Left;Anterior;Upper 11/19/15  --  -- 2699            Intake/Output Last 24 hours No intake or output data in the 24 hours ending  04/10/23 0238  Labs/Imaging Results for orders placed or performed during the hospital encounter of 04/09/23 (from the past 48 hour(s))  CBC with Differential     Status: Abnormal   Collection Time: 04/10/23 12:01 AM  Result Value Ref Range   WBC 13.7 (H) 4.0 - 10.5 K/uL   RBC 3.79 (L) 3.87 - 5.11 MIL/uL   Hemoglobin 11.6 (L) 12.0 - 15.0 g/dL   HCT 16.1 (L) 09.6 - 04.5 %   MCV 93.1 80.0 - 100.0 fL   MCH 30.6 26.0 - 34.0 pg   MCHC 32.9 30.0 - 36.0 g/dL   RDW 40.9 81.1 - 91.4 %   Platelets 344 150 - 400 K/uL   nRBC 0.0 0.0 - 0.2 %   Neutrophils Relative % 85 %   Neutro Abs 11.5 (H) 1.7 - 7.7 K/uL   Lymphocytes Relative 7 %   Lymphs Abs 0.9 0.7 - 4.0 K/uL   Monocytes Relative 7 %   Monocytes Absolute 1.0 0.1 - 1.0 K/uL    Eosinophils Relative 0 %   Eosinophils Absolute 0.1 0.0 - 0.5 K/uL   Basophils Relative 0 %   Basophils Absolute 0.1 0.0 - 0.1 K/uL   Immature Granulocytes 1 %   Abs Immature Granulocytes 0.10 (H) 0.00 - 0.07 K/uL    Comment: Performed at [facility], [address], [place], [place] [address]  Basic metabolic panel     Status: Abnormal   Collection Time: 04/10/23 12:01 AM  Result Value Ref Range   Sodium 137 135 - 145 mmol/L   Potassium 3.8 3.5 - 5.1 mmol/L   Chloride 97 (L) 98 - 111 mmol/L   CO2 24 22 - 32 mmol/L   Glucose, Bld 160 (H) 70 - 99 mg/dL    Comment: Glucose reference range applies only to samples taken after fasting for at least 8 hours.   BUN 16 8 - 23 mg/dL   Creatinine, Ser 6.21 0.44 - 1.00 mg/dL   Calcium 9.7 8.9 - 30.8 mg/dL   GFR, Estimated >65 >78 mL/min    Comment: (NOTE) Calculated using the CKD-EPI Creatinine Equation (2021)    Anion gap 16 (H) 5 - 15    Comment: Performed at [facility], [address], [place], [place] [address]  Type and screen [facility]     Status: None (Preliminary result)   Collection Time: 04/10/23  2:00 AM  Result Value Ref Range   ABO/RH(D) PENDING    Antibody Screen PENDING    Sample Expiration      04/13/2023,2359 Performed at [facility], [address] [place], [place] [address]   ABO/Rh     Status: None (Preliminary result)   Collection Time: 04/10/23  2:06 AM  Result Value Ref Range   ABO/RH(D) PENDING    CT Head Wo Contrast  Result Date: 04/10/2023 CLINICAL DATA:  Head trauma, minor (Age >= 65y); Neck trauma (Age >= 65y). Fall EXAM: CT HEAD WITHOUT CONTRAST CT CERVICAL SPINE WITHOUT CONTRAST TECHNIQUE: Multidetector CT imaging of the head and cervical spine was performed following the standard protocol without intravenous contrast. Multiplanar CT image reconstructions of the cervical spine were also generated. RADIATION DOSE REDUCTION: This exam was performed according to the  departmental dose-optimization program which includes automated exposure control, adjustment of the mA and/or kV according to patient size and/or use of iterative reconstruction technique. COMPARISON:  CT head 09/01/2017, CT C-spine 06/17/2015 FINDINGS: CT HEAD FINDINGS Brain: Cerebral  ventricle sizes are concordant with the degree of cerebral volume loss. Patchy and confluent areas of decreased attenuation are noted throughout the deep and periventricular white matter of the cerebral hemispheres bilaterally, compatible with chronic microvascular ischemic disease. Right occipital encephalomalacia. No evidence of large-territorial acute infarction. No parenchymal hemorrhage. No mass lesion. No extra-axial collection. No mass effect or midline shift. No hydrocephalus. Basilar cisterns are patent. Vascular: No hyperdense vessel. Atherosclerotic calcifications are present within the cavernous internal carotid and vertebral arteries. Skull: No acute fracture or focal lesion. Bilateral temporomandibular joint degenerative changes. Sinuses/Orbits: Paranasal sinuses and mastoid air cells are clear. Bilateral lens replacement. Otherwise the orbits are unremarkable. Other: None. CT CERVICAL SPINE FINDINGS Alignment: Normal. Skull base and vertebrae: Multilevel moderate degenerative changes of the spine with intervertebral disc space vacuum phenomenon head the C5-C6 level as well as posterior disc osteophyte complex formation. No associated severe osseous neural foraminal or central canal stenosis. No acute fracture. No aggressive appearing focal osseous lesion or focal pathologic process. Soft tissues and spinal canal: No prevertebral fluid or swelling. No visible canal hematoma. Upper chest: Biapical pleural/pulmonary scarring. Other: Atherosclerotic plaque of the carotid arteries within the neck. IMPRESSION: 1. No acute intracranial abnormality. 2. No acute displaced fracture or traumatic listhesis of the cervical spine.  Electronically Signed   By: Tish Frederickson M.D.   On: 04/10/2023 00:33   CT Cervical Spine Wo Contrast  Result Date: 04/10/2023 CLINICAL DATA:  Head trauma, minor (Age >= 65y); Neck trauma (Age >= 65y). Fall EXAM: CT HEAD WITHOUT CONTRAST CT CERVICAL SPINE WITHOUT CONTRAST TECHNIQUE: Multidetector CT imaging of the head and cervical spine was performed following the standard protocol without intravenous contrast. Multiplanar CT image reconstructions of the cervical spine were also generated. RADIATION DOSE REDUCTION: This exam was performed according to the departmental dose-optimization program which includes automated exposure control, adjustment of the mA and/or kV according to patient size and/or use of iterative reconstruction technique. COMPARISON:  CT head 09/01/2017, CT C-spine 06/17/2015 FINDINGS: CT HEAD FINDINGS Brain: Cerebral ventricle sizes are concordant with the degree of cerebral volume loss. Patchy and confluent areas of decreased attenuation are noted throughout the deep and periventricular white matter of the cerebral hemispheres bilaterally, compatible with chronic microvascular ischemic disease. Right occipital encephalomalacia. No evidence of large-territorial acute infarction. No parenchymal hemorrhage. No mass lesion. No extra-axial collection. No mass effect or midline shift. No hydrocephalus. Basilar cisterns are patent. Vascular: No hyperdense vessel. Atherosclerotic calcifications are present within the cavernous internal carotid and vertebral arteries. Skull: No acute fracture or focal lesion. Bilateral temporomandibular joint degenerative changes. Sinuses/Orbits: Paranasal sinuses and mastoid air cells are clear. Bilateral lens replacement. Otherwise the orbits are unremarkable. Other: None. CT CERVICAL SPINE FINDINGS Alignment: Normal. Skull base and vertebrae: Multilevel moderate degenerative changes of the spine with intervertebral disc space vacuum phenomenon head the C5-C6  level as well as posterior disc osteophyte complex formation. No associated severe osseous neural foraminal or central canal stenosis. No acute fracture. No aggressive appearing focal osseous lesion or focal pathologic process. Soft tissues and spinal canal: No prevertebral fluid or swelling. No visible canal hematoma. Upper chest: Biapical pleural/pulmonary scarring. Other: Atherosclerotic plaque of the carotid arteries within the neck. IMPRESSION: 1. No acute intracranial abnormality. 2. No acute displaced fracture or traumatic listhesis of the cervical spine. Electronically Signed   By: Tish Frederickson M.D.   On: 04/10/2023 00:33   DG Hip Port Unilat W or Wo Pelvis 1 View Left  Result Date:  04/10/2023 CLINICAL DATA:  pain.  Fall EXAM: DG HIP (WITH OR WITHOUT PELVIS) 1V PORT LEFT COMPARISON:  None Available. FINDINGS: Acute superiorly displaced left femoral neck fracture. No left hip dislocation. Frontal view of the right hip demonstrates no acute displaced fracture or dislocation. No acute displaced fracture or diastasis of the bones of the pelvis- Limited evaluation due to overlapping osseous structures and overlying soft tissues. There is no evidence of arthropathy or other focal bone abnormality. IMPRESSION: Acute superiorly displaced left femoral neck fracture. Electronically Signed   By: Tish Frederickson M.D.   On: 04/10/2023 00:25   DG Knee Left Port  Result Date: 04/10/2023 CLINICAL DATA:  pain.  Fall EXAM: PORTABLE LEFT KNEE - 1-2 VIEW COMPARISON:  None Available. FINDINGS: No evidence of fracture, dislocation, or joint effusion. No evidence of severe arthropathy. No aggressive appearing focal bone abnormality. Soft tissues are unremarkable. Vascular calcification. IMPRESSION: No acute displaced fracture or dislocation. Electronically Signed   By: Tish Frederickson M.D.   On: 04/10/2023 00:16   DG Chest Portable 1 View  Result Date: 04/10/2023 CLINICAL DATA:  pain EXAM: PORTABLE CHEST 1 VIEW  COMPARISON:  Chest x-ray 01/15/2021, CT chest 08/02/2020 FINDINGS: Dual lead left chest wall cardiac pacemaker. The heart and mediastinal contours are unchanged. Aortic calcification. Stable calcified right lower lower lung zone pulmonary nodules. No focal consolidation. Chronic coarse interstitial markings with no overt pulmonary edema. No pleural effusion. No pneumothorax. No acute osseous abnormality. IMPRESSION: No active disease. Electronically Signed   By: Tish Frederickson M.D.   On: 04/10/2023 00:11    Pending Labs Unresulted Labs (From admission, onward)     Start     Ordered   04/10/23 0129  Urinalysis, Routine w reflex microscopic -Urine, Clean Catch  Once,   URGENT       Question:  Specimen Source  Answer:  Urine, Clean Catch   04/10/23 0128            Vitals/Pain Today's Vitals   04/09/23 2253 04/09/23 2258 04/09/23 2306 04/10/23 0153  BP:   (!) 189/83 (!) 153/64  Pulse:   (!) 103 93  Resp:   12 18  Temp:   98.4 F (36.9 C) (!) 97.5 F (36.4 C)  TempSrc:   Oral Oral  SpO2: 98%  100% 99%  Height:  5\' 6"  (1.676 m)      Isolation Precautions No active isolations  Medications Medications  fentaNYL (SUBLIMAZE) injection 50 mcg (has no administration in time range)  fentaNYL (SUBLIMAZE) injection 50 mcg (50 mcg Intravenous Given 04/09/23 2333)  sodium chloride 0.9 % bolus 500 mL (500 mLs Intravenous New Bag/Given 04/10/23 0203)    Mobility walks with device     Focused Assessments     R Recommendations: See Admitting Provider Note  Report given to:   Additional Notes: Pt came in for fall. She's being admitted for a L hip fx. She has a 20 G in the [facility]. She got fentanyl for pain and has a 500 bolus of NaCl going. She has a purewick and family went home.

## 2023-04-10 NOTE — Consult Note (Signed)
Reason for Consult:Left hip fx Referring Physician: [name] Time called: 0730 Time at bedside: 0918   Gabrielle Hoffman is an 89 y.o. female.  HPI: Maylen fell at home yesterday after tripping on a rug. She had immediate pain and could not get up. She was brought to the ED where x-rays showed a left hip fx and orthopedic surgery was consulted. She is moderately demented and cannot contribute meaningfully to history.  Past Medical History:  Diagnosis Date   Asthma    DDD (degenerative disc disease), lumbar    Dementia    very mild    Depression    Disc disorder of cervical region    Diverticulosis    Hyperlipidemia    Hypertension    Hyponatremia 12/17/2011   Kidney stones    "passed them"   Memory loss    Mitral valve prolapse    Osteopenia    Pneumonia    "I believe I had it a long time ago"   Presence of permanent cardiac pacemaker    Syncope    UTI (lower urinary tract infection)    Vaginal prolapse    Venous insufficiency     Past Surgical History:  Procedure Laterality Date   ABDOMINAL HYSTERECTOMY     APPENDECTOMY     EP IMPLANTABLE DEVICE N/A 08/27/2015   Procedure: Loop Recorder Insertion;  Surgeon: Marinus Maw, MD;  Location: [facility] INVASIVE CV LAB;  Service: Cardiovascular;  Laterality: N/A;   EP IMPLANTABLE DEVICE N/A 11/19/2015   Procedure: Pacemaker Implant;  Surgeon: Marinus Maw, MD;  Location: [facility] INVASIVE CV LAB;  Service: Cardiovascular;  Laterality: N/A;   INSERT / REPLACE / REMOVE PACEMAKER  11/19/2015   TONSILLECTOMY      Family History  Problem Relation Age of Onset   Cancer Sister     Social History:  reports that she has never smoked. She has never used smokeless tobacco. She reports that she does not drink alcohol and does not use drugs.  Allergies:  Allergies  Allergen Reactions   Carvedilol Other (See Comments), Itching and Rash   Sulfa Antibiotics Rash   Sulfamethoxazole Rash    Medications: I have reviewed the patient's current  medications.  Results for orders placed or performed during the hospital encounter of 04/09/23 (from the past 48 hour(s))  CBC with Differential     Status: Abnormal   Collection Time: 04/10/23 12:01 AM  Result Value Ref Range   WBC 13.7 (H) 4.0 - 10.5 K/uL   RBC 3.79 (L) 3.87 - 5.11 MIL/uL   Hemoglobin 11.6 (L) 12.0 - 15.0 g/dL   HCT 16.1 (L) 09.6 - 04.5 %   MCV 93.1 80.0 - 100.0 fL   MCH 30.6 26.0 - 34.0 pg   MCHC 32.9 30.0 - 36.0 g/dL   RDW 40.9 81.1 - 91.4 %   Platelets 344 150 - 400 K/uL   nRBC 0.0 0.0 - 0.2 %   Neutrophils Relative % 85 %   Neutro Abs 11.5 (H) 1.7 - 7.7 K/uL   Lymphocytes Relative 7 %   Lymphs Abs 0.9 0.7 - 4.0 K/uL   Monocytes Relative 7 %   Monocytes Absolute 1.0 0.1 - 1.0 K/uL   Eosinophils Relative 0 %   Eosinophils Absolute 0.1 0.0 - 0.5 K/uL   Basophils Relative 0 %   Basophils Absolute 0.1 0.0 - 0.1 K/uL   Immature Granulocytes 1 %   Abs Immature Granulocytes 0.10 (H) 0.00 - 0.07 K/uL  Comment: Performed at [facility], [address], [place], [place] [address]  Basic metabolic panel     Status: Abnormal   Collection Time: 04/10/23 12:01 AM  Result Value Ref Range   Sodium 137 135 - 145 mmol/L   Potassium 3.8 3.5 - 5.1 mmol/L   Chloride 97 (L) 98 - 111 mmol/L   CO2 24 22 - 32 mmol/L   Glucose, Bld 160 (H) 70 - 99 mg/dL    Comment: Glucose reference range applies only to samples taken after fasting for at least 8 hours.   BUN 16 8 - 23 mg/dL   Creatinine, Ser 1.91 0.44 - 1.00 mg/dL   Calcium 9.7 8.9 - 47.8 mg/dL   GFR, Estimated >29 >56 mL/min    Comment: (NOTE) Calculated using the CKD-EPI Creatinine Equation (2021)    Anion gap 16 (H) 5 - 15    Comment: Performed at [facility], [address] [place], [place] [address]  Type and screen [facility]     Status: None   Collection Time: 04/10/23  2:00 AM  Result Value Ref Range   ABO/RH(D) O POS    Antibody Screen NEG    Sample Expiration       04/13/2023,2359 Performed at [facility], [address], [place], [place] [address]   ABO/Rh     Status: None   Collection Time: 04/10/23  2:06 AM  Result Value Ref Range   ABO/RH(D) O POS    No rh immune globuloin      NOT A RH IMMUNE GLOBULIN CANDIDATE, PT RH POSITIVE Performed at [facility], [address] [place], [place] [address]   Urinalysis, Routine w reflex microscopic -Urine, Clean Catch     Status: Abnormal   Collection Time: 04/10/23  2:12 AM  Result Value Ref Range   Color, Urine YELLOW YELLOW   APPearance HAZY (A) CLEAR   Specific Gravity, Urine 1.010 1.005 - 1.030   pH 7.0 5.0 - 8.0   Glucose, UA 50 (A) NEGATIVE mg/dL   Hgb urine dipstick NEGATIVE NEGATIVE   Bilirubin Urine NEGATIVE NEGATIVE   Ketones, ur 5 (A) NEGATIVE mg/dL   Protein, ur NEGATIVE NEGATIVE mg/dL   Nitrite POSITIVE (A) NEGATIVE   Leukocytes,Ua TRACE (A) NEGATIVE   RBC / HPF 0-5 0 - 5 RBC/hpf   WBC, UA 6-10 0 - 5 WBC/hpf   Bacteria, UA RARE (A) NONE SEEN   Squamous Epithelial / HPF 0-5 0 - 5 /HPF    Comment: Performed at [facility], [address] [place], [place] [address]  Surgical pcr screen     Status: Abnormal   Collection Time: 04/10/23  4:16 AM   Specimen: Nasal Mucosa; Nasal Swab  Result Value Ref Range   MRSA, PCR POSITIVE (A) NEGATIVE    Comment: RESULT CALLED TO, READ BACK BY AND VERIFIED WITH: OPOKU RN 04/10/23 @ 0616 BY AB    Staphylococcus aureus POSITIVE (A) NEGATIVE    Comment: (NOTE) The Xpert SA Assay (FDA approved for NASAL specimens in patients 50 years of age and older), is one component of a comprehensive surveillance program. It is not intended to diagnose infection nor to guide or monitor treatment. Performed at [facility], [address], [place], [place] [address]     CT Head Wo Contrast  Result Date: 04/10/2023 CLINICAL DATA:  Head trauma, minor (Age >= 65y); Neck trauma (Age >= 65y).  Fall EXAM: CT HEAD WITHOUT CONTRAST CT  CERVICAL SPINE WITHOUT CONTRAST TECHNIQUE: Multidetector CT imaging of the head and cervical spine was performed following the standard protocol without intravenous contrast. Multiplanar CT image reconstructions of the cervical spine were also generated. RADIATION DOSE REDUCTION: This exam was performed according to the departmental dose-optimization program which includes automated exposure control, adjustment of the mA and/or kV according to patient size and/or use of iterative reconstruction technique. COMPARISON:  CT head 09/01/2017, CT C-spine 06/17/2015 FINDINGS: CT HEAD FINDINGS Brain: Cerebral ventricle sizes are concordant with the degree of cerebral volume loss. Patchy and confluent areas of decreased attenuation are noted throughout the deep and periventricular white matter of the cerebral hemispheres bilaterally, compatible with chronic microvascular ischemic disease. Right occipital encephalomalacia. No evidence of large-territorial acute infarction. No parenchymal hemorrhage. No mass lesion. No extra-axial collection. No mass effect or midline shift. No hydrocephalus. Basilar cisterns are patent. Vascular: No hyperdense vessel. Atherosclerotic calcifications are present within the cavernous internal carotid and vertebral arteries. Skull: No acute fracture or focal lesion. Bilateral temporomandibular joint degenerative changes. Sinuses/Orbits: Paranasal sinuses and mastoid air cells are clear. Bilateral lens replacement. Otherwise the orbits are unremarkable. Other: None. CT CERVICAL SPINE FINDINGS Alignment: Normal. Skull base and vertebrae: Multilevel moderate degenerative changes of the spine with intervertebral disc space vacuum phenomenon head the C5-C6 level as well as posterior disc osteophyte complex formation. No associated severe osseous neural foraminal or central canal stenosis. No acute fracture. No aggressive appearing focal osseous lesion or focal pathologic process. Soft tissues and  spinal canal: No prevertebral fluid or swelling. No visible canal hematoma. Upper chest: Biapical pleural/pulmonary scarring. Other: Atherosclerotic plaque of the carotid arteries within the neck. IMPRESSION: 1. No acute intracranial abnormality. 2. No acute displaced fracture or traumatic listhesis of the cervical spine. Electronically Signed   By: Tish Frederickson M.D.   On: 04/10/2023 00:33   CT Cervical Spine Wo Contrast  Result Date: 04/10/2023 CLINICAL DATA:  Head trauma, minor (Age >= 65y); Neck trauma (Age >= 65y). Fall EXAM: CT HEAD WITHOUT CONTRAST CT CERVICAL SPINE WITHOUT CONTRAST TECHNIQUE: Multidetector CT imaging of the head and cervical spine was performed following the standard protocol without intravenous contrast. Multiplanar CT image reconstructions of the cervical spine were also generated. RADIATION DOSE REDUCTION: This exam was performed according to the departmental dose-optimization program which includes automated exposure control, adjustment of the mA and/or kV according to patient size and/or use of iterative reconstruction technique. COMPARISON:  CT head 09/01/2017, CT C-spine 06/17/2015 FINDINGS: CT HEAD FINDINGS Brain: Cerebral ventricle sizes are concordant with the degree of cerebral volume loss. Patchy and confluent areas of decreased attenuation are noted throughout the deep and periventricular white matter of the cerebral hemispheres bilaterally, compatible with chronic microvascular ischemic disease. Right occipital encephalomalacia. No evidence of large-territorial acute infarction. No parenchymal hemorrhage. No mass lesion. No extra-axial collection. No mass effect or midline shift. No hydrocephalus. Basilar cisterns are patent. Vascular: No hyperdense vessel. Atherosclerotic calcifications are present within the cavernous internal carotid and vertebral arteries. Skull: No acute fracture or focal lesion. Bilateral temporomandibular joint degenerative changes. Sinuses/Orbits:  Paranasal sinuses and mastoid air cells are clear. Bilateral lens replacement. Otherwise the orbits are unremarkable. Other: None. CT CERVICAL SPINE FINDINGS Alignment: Normal. Skull base and vertebrae: Multilevel moderate degenerative changes of the spine with intervertebral disc space vacuum phenomenon head the C5-C6 level as well as posterior disc osteophyte complex formation. No associated severe osseous neural foraminal or central canal stenosis.  No acute fracture. No aggressive appearing focal osseous lesion or focal pathologic process. Soft tissues and spinal canal: No prevertebral fluid or swelling. No visible canal hematoma. Upper chest: Biapical pleural/pulmonary scarring. Other: Atherosclerotic plaque of the carotid arteries within the neck. IMPRESSION: 1. No acute intracranial abnormality. 2. No acute displaced fracture or traumatic listhesis of the cervical spine. Electronically Signed   By: Tish Frederickson M.D.   On: 04/10/2023 00:33   DG Hip Port [place] W or [place] Pelvis 1 View Left  Result Date: 04/10/2023 CLINICAL DATA:  pain.  Fall EXAM: DG HIP (WITH OR WITHOUT PELVIS) 1V PORT LEFT COMPARISON:  None Available. FINDINGS: Acute superiorly displaced left femoral neck fracture. No left hip dislocation. Frontal view of the right hip demonstrates no acute displaced fracture or dislocation. No acute displaced fracture or diastasis of the bones of the pelvis- Limited evaluation due to overlapping osseous structures and overlying soft tissues. There is no evidence of arthropathy or other focal bone abnormality. IMPRESSION: Acute superiorly displaced left femoral neck fracture. Electronically Signed   By: Tish Frederickson M.D.   On: 04/10/2023 00:25   DG Knee Left Port  Result Date: 04/10/2023 CLINICAL DATA:  pain.  Fall EXAM: PORTABLE LEFT KNEE - 1-2 VIEW COMPARISON:  None Available. FINDINGS: No evidence of fracture, dislocation, or joint effusion. No evidence of severe arthropathy. No aggressive  appearing focal bone abnormality. Soft tissues are unremarkable. Vascular calcification. IMPRESSION: No acute displaced fracture or dislocation. Electronically Signed   By: Tish Frederickson M.D.   On: 04/10/2023 00:16   DG Chest Portable 1 View  Result Date: 04/10/2023 CLINICAL DATA:  pain EXAM: PORTABLE CHEST 1 VIEW COMPARISON:  Chest x-ray 01/15/2021, CT chest 08/02/2020 FINDINGS: Dual lead left chest wall cardiac pacemaker. The heart and mediastinal contours are unchanged. Aortic calcification. Stable calcified right lower lower lung zone pulmonary nodules. No focal consolidation. Chronic coarse interstitial markings with no overt pulmonary edema. No pleural effusion. No pneumothorax. No acute osseous abnormality. IMPRESSION: No active disease. Electronically Signed   By: Tish Frederickson M.D.   On: 04/10/2023 00:11    Review of Systems  Unable to perform ROS: Dementia   Blood pressure (!) 158/60, pulse 91, temperature 97.9 F (36.6 C), temperature source Oral, resp. rate 16, height 5\' 6"  (1.676 m), SpO2 98%. Physical Exam Constitutional:      General: She is not in acute distress.    Appearance: She is well-developed. She is not diaphoretic.  HENT:     Head: Normocephalic and atraumatic.  Eyes:     General: No scleral icterus.       Right eye: No discharge.        Left eye: No discharge.     Conjunctiva/sclera: Conjunctivae normal.  Cardiovascular:     Rate and Rhythm: Normal rate and regular rhythm.  Pulmonary:     Effort: Pulmonary effort is normal. No respiratory distress.  Musculoskeletal:     Cervical back: Normal range of motion.     Comments: LLE No traumatic wounds, ecchymosis, or rash  Nontender, left leg shortened  No knee or ankle effusion  Knee stable to varus/ valgus and anterior/posterior stress  Sens DPN, SPN, TN intact  Motor EHL, ext, flex, evers 5/5  DP 1+, PT 0, No significant edema  Skin:    General: Skin is warm and dry.  Neurological:     Mental Status:  She is alert.  Psychiatric:        Mood and Affect:  Mood normal.        Behavior: Behavior normal.     Assessment/Plan: Left hip fx -- Plan hip hemi today with Dr. Thad Ranger. Please keep NPO.    Freeman Caldron, PA-C Orthopedic Surgery [phone] 04/10/2023, 9:24 AM

## 2023-04-10 NOTE — Progress Notes (Addendum)
Patient admitted after midnight, please see H&P.  Here with hip fracture.  Ortho consulted and OR timing still yet to be determined.  Son updated-- please update daily if able.' Confirmed DNR status with son-- DNR in [facility] documents.  [name] DO

## 2023-04-11 DIAGNOSIS — E0590 Thyrotoxicosis, unspecified without thyrotoxic crisis or storm: Secondary | ICD-10-CM | POA: Diagnosis present

## 2023-04-11 DIAGNOSIS — S72002D Fracture of unspecified part of neck of left femur, subsequent encounter for closed fracture with routine healing: Secondary | ICD-10-CM

## 2023-04-11 LAB — BASIC METABOLIC PANEL
Anion gap: 11 (ref 5–15)
BUN: 10 mg/dL (ref 8–23)
CO2: 22 mmol/L (ref 22–32)
Calcium: 8.1 mg/dL — ABNORMAL LOW (ref 8.9–10.3)
Chloride: 99 mmol/L (ref 98–111)
Creatinine, Ser: 0.77 mg/dL (ref 0.44–1.00)
GFR, Estimated: >60 mL/min (ref >60)
Glucose, Bld: 107 mg/dL — ABNORMAL HIGH (ref 70–99)
Potassium: 4.1 mmol/L (ref 3.5–5.1)
Sodium: 132 mmol/L — ABNORMAL LOW (ref 135–145)

## 2023-04-11 LAB — CBC
HCT: 32 % — ABNORMAL LOW (ref 36.0–46.0)
Hemoglobin: 10.5 g/dL — ABNORMAL LOW (ref 12.0–15.0)
MCH: 29.9 pg (ref 26.0–34.0)
MCHC: 32.8 g/dL (ref 30.0–36.0)
MCV: 91.2 fL (ref 80.0–100.0)
Platelets: 281 10*3/uL (ref 150–400)
RBC: 3.51 MIL/uL — ABNORMAL LOW (ref 3.87–5.11)
RDW: 14.6 % (ref 11.5–15.5)
WBC: 13.4 10*3/uL — ABNORMAL HIGH (ref 4.0–10.5)
nRBC: 0 % (ref 0.0–0.2)

## 2023-04-11 MED ORDER — METHIMAZOLE 2.5 MG HALF TABLET
2.5000 mg | ORAL_TABLET | Freq: Every day | ORAL | Status: DC
  Administered 2023-04-11 – 2023-04-15 (×5): 2.5 mg via ORAL
  Filled 2023-04-11 (×5): qty 1

## 2023-04-11 MED ORDER — BISOPROLOL FUMARATE 5 MG PO TABS
5.0000 mg | ORAL_TABLET | Freq: Every day | ORAL | Status: DC
  Administered 2023-04-11 – 2023-04-15 (×5): 5 mg via ORAL
  Filled 2023-04-11 (×5): qty 1

## 2023-04-11 NOTE — Evaluation (Signed)
Occupational Therapy Evaluation Patient Details Name: Gabrielle Hoffman MRN: [id number] DOB: 1930/08/21 Today's Date: 04/11/2023   History of Present Illness The pt is a 89 yo female presenting after a fall at home resulting in L hip pain. Pt found to have L hip fx and possible UTI. She is now s/p L hip hemiarthroplasty and is WBAT on LLE. PMH includes: pelvic fx, dementia, HLD, HTN, hyponatremia, asthma, depression, and PPM.   Clinical Impression   Pt admitted for above, presents with L hip pain and generalized weakness. Pt not able to tolerate any OOB mobility and is limited with any movement of LLE. When asking where her pain is she may say her L hip, other times she may say that she does not know. She needs Max to total A for LB ADLs and Total A +2 to get to EOB and maintain static sitting position. Pt refused any OOB mobility today. Pt would benefit from continued acute skilled OT services to address deficits and help transition to next level of care. Patient would benefit from post acute skilled rehab facility with <3 hours of therapy and 24/7 support        If plan is discharge home, recommend the following: Two people to help with walking and/or transfers;A lot of help with bathing/dressing/bathroom;Assistance with cooking/housework;Direct supervision/assist for financial management;Assistance with feeding;Direct supervision/assist for medications management;Supervision due to cognitive status    Functional Status Assessment  Patient has had a recent decline in their functional status and demonstrates the ability to make significant improvements in function in a reasonable and predictable amount of time.  Equipment Recommendations  None recommended by OT (defer to next level of care)    Recommendations for Other Services       Precautions / Restrictions Precautions Precautions: Fall Restrictions Weight Bearing Restrictions: Yes LLE Weight Bearing: Weight bearing as tolerated       Mobility Bed Mobility Overal bed mobility: Needs Assistance Bed Mobility: Supine to Sit, Sit to Supine     Supine to sit: Total assist, +2 for physical assistance, +2 for safety/equipment Sit to supine: +2 for physical assistance, +2 for safety/equipment, Total assist   General bed mobility comments: cues for pt to use bed rails-helicopter method used due to lack of BLE strenght and movement, Total A to maintain static sitting balance with one UE support on rail    Transfers                   General transfer comment: Pt with poor tolerance for mobility, refused transfers serveral times despite encouragement and reassurance from PT/OT      Balance Overall balance assessment: Needs assistance Sitting-balance support: Single extremity supported, Feet supported Sitting balance-Leahy Scale: Zero         Standing balance comment: refusal                           ADL either performed or assessed with clinical judgement   ADL Overall ADL's : Needs assistance/impaired Eating/Feeding: Bed level;Set up   Grooming: Bed level;Set up   Upper Body Bathing: Bed level;Minimal assistance   Lower Body Bathing: Total assistance;Sitting/lateral leans   Upper Body Dressing : Moderate assistance;Bed level   Lower Body Dressing: Total assistance;Bed level     Toilet Transfer Details (indicate cue type and reason): NT   Toileting - Clothing Manipulation Details (indicate cue type and reason): NT       General ADL Comments:  Pt with poor tolerance for mobility, refused transfers serveral times despite encouragement and reassurance from PT/OT     Vision         Perception         Praxis         Pertinent Vitals/Pain Pain Assessment Pain Assessment: Faces Faces Pain Scale: Hurts whole lot Pain Location: L hip with movement Pain Descriptors / Indicators: Aching, Sore, Discomfort, Grimacing, Guarding Pain Intervention(s): Limited activity within  patient's tolerance, Monitored during session, Repositioned     Extremity/Trunk Assessment Upper Extremity Assessment Upper Extremity Assessment: Generalized weakness   Lower Extremity Assessment Lower Extremity Assessment: Defer to PT evaluation       Communication     Cognition Arousal: Alert Behavior During Therapy: WFL for tasks assessed/performed Overall Cognitive Status: No family/caregiver present to determine baseline cognitive functioning Area of Impairment: Orientation, Following commands, Problem solving, Awareness                 Orientation Level: Disoriented to, Situation, Time     Following Commands: Follows one step commands with increased time   Awareness: Intellectual Problem Solving: Slow processing, Requires verbal cues, Requires tactile cues General Comments: Pt cannot recall her birthday but knows her name, with cueing able to come to conclusion that she is in the hopsital     General Comments  VSS on RA    Exercises     Shoulder Instructions      Home Living Family/patient expects to be discharged to:: Private residence Living Arrangements: Alone Available Help at Discharge: Family Type of Home: House                           Additional Comments: Further info to be obtained from son "[place]"      Prior Functioning/Environment                          OT Problem List: Decreased strength;Impaired balance (sitting and/or standing);Decreased cognition      OT Treatment/Interventions: Self-care/ADL training;Balance training;Therapeutic exercise;Therapeutic activities;Patient/family education    OT Goals(Current goals can be found in the care plan section) Acute Rehab OT Goals Patient Stated Goal: none stated Time For Goal Achievement: 04/25/23 Potential to Achieve Goals: Good ADL Goals Pt Will Perform Grooming: with set-up;with supervision;sitting Pt Will Transfer to Toilet: with +2 assist;stand pivot  transfer;bedside commode;with mod assist Additional ADL Goal #1: Pt will tolerate 5 mins EOB sitting with CGA in preparation to complete EOB ADLs.  OT Frequency: Min 1X/week    Co-evaluation PT/OT/SLP Co-Evaluation/Treatment: Yes Reason for Co-Treatment: Complexity of the patient's impairments (multi-system involvement);For patient/therapist safety;To address functional/ADL transfers PT goals addressed during session: Mobility/safety with mobility;Balance OT goals addressed during session: ADL's and self-care      AM-PAC OT "6 Clicks" Daily Activity     Outcome Measure Help from another person eating meals?: A Little Help from another person taking care of personal grooming?: A Little Help from another person toileting, which includes using toliet, bedpan, or urinal?: Total Help from another person bathing (including washing, rinsing, drying)?: A Lot Help from another person to put on and taking off regular upper body clothing?: A Lot Help from another person to put on and taking off regular lower body clothing?: Total 6 Click Score: 12   End of Session Nurse Communication: Mobility status (Total A +2 for sitting)  Activity Tolerance: Patient limited by pain Patient  left: in bed;with call bell/phone within reach;with bed alarm set (bed in chair position)  OT Visit Diagnosis: Other abnormalities of gait and mobility (R26.89);Unsteadiness on feet (R26.81);History of falling (Z91.81);Pain;Muscle weakness (generalized) (M62.81) Pain - Right/Left: Left Pain - part of body: Hip                Time: 1610-9604 OT Time Calculation (min): 22 min Charges:  OT Evaluation $OT Eval Moderate Complexity: 1 Mod  04/11/2023  AB, OTR/L  Acute Rehabilitation Services  Office: [phone]   Tristan Schroeder 04/11/2023, 1:06 PM

## 2023-04-11 NOTE — Progress Notes (Addendum)
PROGRESS NOTE    Gabrielle Hoffman  NFA:[id number] DOB: [birth year] DOA: 04/09/2023 PCP: Farris Has, MD   Brief Narrative:  HPI: Gabrielle Hoffman is a 89 y.o. female with medical history significant of  Dementia,HLD, HTN, hyponatremia,asthma, DJD lumbar spine, depression who presents to ED have mechanical fall at home with resulting hip pain. Per family patient came in from a walk and tripped on a rug. Patient currently states mild pain with lying still however with movement has intense pain in left leg.  ON ros she notes no chest pain, n/v//d/abdominal pain/ fever or chills.    ED Course:  Afeb, bp 189/83, hr 103, rr 12, sat 100% on ra   Assessment & Plan:   Principal Problem:   Hip fracture (HCC) Active Problems:   Lower urinary tract infectious disease   HTN (hypertension)   Dementia (HCC)   Hyperthyroidism  Acute superiorly displaced left femoral neck fracture: Orthopedic consulted, s/p surgical repair on 04/10/2023.  Doing well postoperatively.  Management per orthopedics.  PT OT to see.  Will likely require SNF discharge.  UTI: Urine culture growing E. coli.  Continue Rocephin and follow sensitivities.  Dementia: At baseline.  She is alert but not oriented.  Hyperlipidemia: Diet controlled.  Hyperthyroidism: Continue methimazole.  Essential hypertension: Continue bisoprolol.  Controlled.  Mild intermittent asthma: Stable.  As needed bronchodilators.  Depression: Continue home medications.  Mild hyponatremia: Monitor.  GERD: PPI.  DVT prophylaxis: heparin injection 5,000 Units Start: 04/11/23 2200 SCDs Start: 04/10/23 0400   Code Status: Limited: Do not attempt resuscitation (DNR) -DNR-LIMITED -Do Not Intubate/DNI   Family Communication:  None present at bedside.    Status is: Inpatient Remains inpatient appropriate because: Patient to be seen by PT OT.   Estimated body mass index is 17.75 kg/m as calculated from the following:   Height as of this encounter:  5\' 6"  (1.676 m).   Weight as of 03/17/23: 49.9 kg.    Nutritional Assessment: Body mass index is 17.75 kg/m.Marland Kitchen Seen by dietician.  I agree with the assessment and plan as outlined below: Nutrition Status: Nutrition Problem: Inadequate oral intake Etiology: inability to eat Signs/Symptoms: NPO status Interventions: Refer to RD note for recommendations  . Skin Assessment: I have examined the patient's skin and I agree with the wound assessment as performed by the wound care RN as outlined below:    Consultants:  Orthopedics  Procedures:  As above  Antimicrobials:  Anti-infectives (From admission, onward)    Start     Dose/Rate Route Frequency Ordered Stop   04/10/23 2039  vancomycin (VANCOCIN) powder  Status:  Discontinued          As needed 04/10/23 2040 04/10/23 2043   04/10/23 1200  ceFAZolin (ANCEF) IVPB 2g/100 mL premix        2 g 200 mL/hr over 30 Minutes Intravenous On call to O.R. 04/10/23 1104 04/10/23 1851   04/10/23 1200  vancomycin (VANCOCIN) IVPB 1000 mg/200 mL premix        1,000 mg 200 mL/hr over 60 Minutes Intravenous On call to O.R. 04/10/23 1104 04/10/23 1654   04/10/23 0500  cefTRIAXone (ROCEPHIN) 1 g in sodium chloride 0.9 % 100 mL IVPB        1 g 200 mL/hr over 30 Minutes Intravenous Every 24 hours 04/10/23 0403           Subjective: Patient seen and examined.  She is alert but not oriented, likely due to her dementia at her  baseline.  She has no complaints.  Objective: Vitals:   04/10/23 2130 04/11/23 0308 04/11/23 0818 04/11/23 0905  BP: (!) 156/76 (!) 122/59 (!) 131/90 (!) 141/73  Pulse: 98 81 100 95  Resp: 18 16  15   Temp: 97.9 F (36.6 C) 98 F (36.7 C) 98.1 F (36.7 C) 98.2 F (36.8 C)  TempSrc: Oral Oral Oral Oral  SpO2: 93% 100% 95% 100%  Height:        Intake/Output Summary (Last 24 hours) at 04/11/2023 1033 Last data filed at 04/10/2023 2033 Gross per 24 hour  Intake 2029.17 ml  Output 150 ml  Net 1879.17 ml   There were no  vitals filed for this visit.  Examination:  General exam: Appears calm and comfortable  Respiratory system: Clear to auscultation. Respiratory effort normal. Cardiovascular system: S1 & S2 heard, RRR. No JVD, murmurs, rubs, gallops or clicks. No pedal edema. Gastrointestinal system: Abdomen is nondistended, soft and nontender. No organomegaly or masses felt. Normal bowel sounds heard. Central nervous system: Alert and oriented x 1. No focal neurological deficits. Skin: No rashes, lesions or ulcers   Data Reviewed: I have personally reviewed following labs and imaging studies  CBC: Recent Labs  Lab 04/10/23 0001 04/10/23 0948 04/11/23 0800  WBC 13.7* 13.9* 13.4*  NEUTROABS 11.5*  --   --   HGB 11.6* 12.5 10.5*  HCT 35.3* 37.9 32.0*  MCV 93.1 90.0 91.2  PLT 344 285 281   Basic Metabolic Panel: Recent Labs  Lab 04/10/23 0001 04/10/23 0948 04/11/23 0800  NA 137 134* 132*  K 3.8 3.8 4.1  CL 97* 97* 99  CO2 24 21* 22  GLUCOSE 160* 119* 107*  BUN 16 11 10   CREATININE 0.87 0.80 0.77  CALCIUM 9.7 9.1 8.1*   GFR: CrCl cannot be calculated (Unknown ideal weight.). Liver Function Tests: No results for input(s): "AST", "ALT", "ALKPHOS", "BILITOT", "PROT", "ALBUMIN" in the last 168 hours. No results for input(s): "LIPASE", "AMYLASE" in the last 168 hours. No results for input(s): "AMMONIA" in the last 168 hours. Coagulation Profile: No results for input(s): "INR", "PROTIME" in the last 168 hours. Cardiac Enzymes: No results for input(s): "CKTOTAL", "CKMB", "CKMBINDEX", "TROPONINI" in the last 168 hours. BNP (last 3 results) No results for input(s): "PROBNP" in the last 8760 hours. HbA1C: No results for input(s): "HGBA1C" in the last 72 hours. CBG: No results for input(s): "GLUCAP" in the last 168 hours. Lipid Profile: No results for input(s): "CHOL", "HDL", "LDLCALC", "TRIG", "CHOLHDL", "LDLDIRECT" in the last 72 hours. Thyroid Function Tests: No results for input(s):  "TSH", "T4TOTAL", "FREET4", "T3FREE", "THYROIDAB" in the last 72 hours. Anemia Panel: No results for input(s): "VITAMINB12", "FOLATE", "FERRITIN", "TIBC", "IRON", "RETICCTPCT" in the last 72 hours. Sepsis Labs: No results for input(s): "PROCALCITON", "LATICACIDVEN" in the last 168 hours.  Recent Results (from the past 240 hour(s))  Urine Culture (for pregnant, neutropenic or urologic patients or patients with an indwelling urinary catheter)     Status: Abnormal (Preliminary result)   Collection Time: 04/10/23  4:04 AM   Specimen: Urine, Clean Catch  Result Value Ref Range Status   Specimen Description URINE, CLEAN CATCH  Final   Special Requests   Final    NONE Performed at [facility], [address], [place], [place] [address]    Culture >=100,000 COLONIES/mL GRAM NEGATIVE RODS (A)  Final   Report Status PENDING  Incomplete  Surgical pcr screen     Status: Abnormal   Collection  Time: 04/10/23  4:16 AM   Specimen: Nasal Mucosa; Nasal Swab  Result Value Ref Range Status   MRSA, PCR POSITIVE (A) NEGATIVE Final    Comment: RESULT CALLED TO, READ BACK BY AND VERIFIED WITH: OPOKU RN 04/10/23 @ 0616 BY AB    Staphylococcus aureus POSITIVE (A) NEGATIVE Final    Comment: (NOTE) The Xpert SA Assay (FDA approved for NASAL specimens in patients 66 years of age and older), is one component of a comprehensive surveillance program. It is not intended to diagnose infection nor to guide or monitor treatment. Performed at [facility], [address], [place], [place] [address]      Radiology Studies: DG Pelvis Portable  Result Date: 04/11/2023 CLINICAL DATA:  Postop hip fracture EXAM: PORTABLE PELVIS 1-2 VIEWS COMPARISON:  04/10/2023 FINDINGS: Interval left hip replacement with intact hardware and normal alignment. Gas in the soft tissues consistent with recent surgery. Old right inferior pubic ramus fracture. IMPRESSION: Status post left hip replacement. Electronically Signed    By: Jasmine Pang M.D.   On: 04/11/2023 00:04   DG HIP UNILAT WITH PELVIS 2-3 VIEWS LEFT  Result Date: 04/11/2023 CLINICAL DATA:  Elective surgery EXAM: DG HIP (WITH OR WITHOUT PELVIS) 2-3V LEFT COMPARISON:  04/10/2023 FINDINGS: Three intraoperative views of the left hip were obtained during operative course of left hip replacement. Old appearing right inferior pubic ramus fracture. IMPRESSION: Intraoperative views of the left hip during operative course of left hip replacement. Electronically Signed   By: Jasmine Pang M.D.   On: 04/11/2023 00:03   CT Head Wo Contrast  Result Date: 04/10/2023 CLINICAL DATA:  Head trauma, minor (Age >= 65y); Neck trauma (Age >= 65y). Fall EXAM: CT HEAD WITHOUT CONTRAST CT CERVICAL SPINE WITHOUT CONTRAST TECHNIQUE: Multidetector CT imaging of the head and cervical spine was performed following the standard protocol without intravenous contrast. Multiplanar CT image reconstructions of the cervical spine were also generated. RADIATION DOSE REDUCTION: This exam was performed according to the departmental dose-optimization program which includes automated exposure control, adjustment of the mA and/or kV according to patient size and/or use of iterative reconstruction technique. COMPARISON:  CT head 09/01/2017, CT C-spine 06/17/2015 FINDINGS: CT HEAD FINDINGS Brain: Cerebral ventricle sizes are concordant with the degree of cerebral volume loss. Patchy and confluent areas of decreased attenuation are noted throughout the deep and periventricular white matter of the cerebral hemispheres bilaterally, compatible with chronic microvascular ischemic disease. Right occipital encephalomalacia. No evidence of large-territorial acute infarction. No parenchymal hemorrhage. No mass lesion. No extra-axial collection. No mass effect or midline shift. No hydrocephalus. Basilar cisterns are patent. Vascular: No hyperdense vessel. Atherosclerotic calcifications are present within the cavernous  internal carotid and vertebral arteries. Skull: No acute fracture or focal lesion. Bilateral temporomandibular joint degenerative changes. Sinuses/Orbits: Paranasal sinuses and mastoid air cells are clear. Bilateral lens replacement. Otherwise the orbits are unremarkable. Other: None. CT CERVICAL SPINE FINDINGS Alignment: Normal. Skull base and vertebrae: Multilevel moderate degenerative changes of the spine with intervertebral disc space vacuum phenomenon head the C5-C6 level as well as posterior disc osteophyte complex formation. No associated severe osseous neural foraminal or central canal stenosis. No acute fracture. No aggressive appearing focal osseous lesion or focal pathologic process. Soft tissues and spinal canal: No prevertebral fluid or swelling. No visible canal hematoma. Upper chest: Biapical pleural/pulmonary scarring. Other: Atherosclerotic plaque of the carotid arteries within the neck. IMPRESSION: 1. No acute intracranial abnormality. 2. No acute displaced fracture or traumatic listhesis of  the cervical spine. Electronically Signed   By: Tish Frederickson M.D.   On: 04/10/2023 00:33   CT Cervical Spine Wo Contrast  Result Date: 04/10/2023 CLINICAL DATA:  Head trauma, minor (Age >= 65y); Neck trauma (Age >= 65y). Fall EXAM: CT HEAD WITHOUT CONTRAST CT CERVICAL SPINE WITHOUT CONTRAST TECHNIQUE: Multidetector CT imaging of the head and cervical spine was performed following the standard protocol without intravenous contrast. Multiplanar CT image reconstructions of the cervical spine were also generated. RADIATION DOSE REDUCTION: This exam was performed according to the departmental dose-optimization program which includes automated exposure control, adjustment of the mA and/or kV according to patient size and/or use of iterative reconstruction technique. COMPARISON:  CT head 09/01/2017, CT C-spine 06/17/2015 FINDINGS: CT HEAD FINDINGS Brain: Cerebral ventricle sizes are concordant with the degree of  cerebral volume loss. Patchy and confluent areas of decreased attenuation are noted throughout the deep and periventricular white matter of the cerebral hemispheres bilaterally, compatible with chronic microvascular ischemic disease. Right occipital encephalomalacia. No evidence of large-territorial acute infarction. No parenchymal hemorrhage. No mass lesion. No extra-axial collection. No mass effect or midline shift. No hydrocephalus. Basilar cisterns are patent. Vascular: No hyperdense vessel. Atherosclerotic calcifications are present within the cavernous internal carotid and vertebral arteries. Skull: No acute fracture or focal lesion. Bilateral temporomandibular joint degenerative changes. Sinuses/Orbits: Paranasal sinuses and mastoid air cells are clear. Bilateral lens replacement. Otherwise the orbits are unremarkable. Other: None. CT CERVICAL SPINE FINDINGS Alignment: Normal. Skull base and vertebrae: Multilevel moderate degenerative changes of the spine with intervertebral disc space vacuum phenomenon head the C5-C6 level as well as posterior disc osteophyte complex formation. No associated severe osseous neural foraminal or central canal stenosis. No acute fracture. No aggressive appearing focal osseous lesion or focal pathologic process. Soft tissues and spinal canal: No prevertebral fluid or swelling. No visible canal hematoma. Upper chest: Biapical pleural/pulmonary scarring. Other: Atherosclerotic plaque of the carotid arteries within the neck. IMPRESSION: 1. No acute intracranial abnormality. 2. No acute displaced fracture or traumatic listhesis of the cervical spine. Electronically Signed   By: Tish Frederickson M.D.   On: 04/10/2023 00:33   DG Hip [place] or [place] Pelvis 1 View Left  Result Date: 04/10/2023 CLINICAL DATA:  pain.  Fall EXAM: DG HIP (WITH OR WITHOUT PELVIS) 1V PORT LEFT COMPARISON:  None Available. FINDINGS: Acute superiorly displaced left femoral neck fracture. No left hip  dislocation. Frontal view of the right hip demonstrates no acute displaced fracture or dislocation. No acute displaced fracture or diastasis of the bones of the pelvis- Limited evaluation due to overlapping osseous structures and overlying soft tissues. There is no evidence of arthropathy or other focal bone abnormality. IMPRESSION: Acute superiorly displaced left femoral neck fracture. Electronically Signed   By: Tish Frederickson M.D.   On: 04/10/2023 00:25   DG Knee Left Port  Result Date: 04/10/2023 CLINICAL DATA:  pain.  Fall EXAM: PORTABLE LEFT KNEE - 1-2 VIEW COMPARISON:  None Available. FINDINGS: No evidence of fracture, dislocation, or joint effusion. No evidence of severe arthropathy. No aggressive appearing focal bone abnormality. Soft tissues are unremarkable. Vascular calcification. IMPRESSION: No acute displaced fracture or dislocation. Electronically Signed   By: Tish Frederickson M.D.   On: 04/10/2023 00:16   DG Chest Portable 1 View  Result Date: 04/10/2023 CLINICAL DATA:  pain EXAM: PORTABLE CHEST 1 VIEW COMPARISON:  Chest x-ray 01/15/2021, CT chest 08/02/2020 FINDINGS: Dual lead left chest wall cardiac pacemaker. The heart and mediastinal contours  are unchanged. Aortic calcification. Stable calcified right lower lower lung zone pulmonary nodules. No focal consolidation. Chronic coarse interstitial markings with no overt pulmonary edema. No pleural effusion. No pneumothorax. No acute osseous abnormality. IMPRESSION: No active disease. Electronically Signed   By: Tish Frederickson M.D.   On: 04/10/2023 00:11    Scheduled Meds:  bisoprolol  5 mg Oral Daily   chlorhexidine  60 mL Topical Once   Chlorhexidine Gluconate Cloth  6 each Topical Q0600   heparin  5,000 Units Subcutaneous Q8H   methimazole  2.5 mg Oral Daily   mupirocin ointment  1 Application Nasal BID   pantoprazole  40 mg Oral QAC supper   povidone-iodine  2 Application Topical Once   scopolamine  1 patch Transdermal Once    Continuous Infusions:  cefTRIAXone (ROCEPHIN)  IV 1 g (04/11/23 0315)   methocarbamol (ROBAXIN) IV       LOS: 1 day   Hughie Closs, MD [facility]  04/11/2023, 10:33 AM   *Please note that this is a verbal dictation therefore any spelling or grammatical errors are due to the "Dragon Medical One" system interpretation.  Please page via Amion and do not message via secure chat for urgent patient care matters. Secure chat can be used for non urgent patient care matters.  How to contact the [facility] Attending or Consulting provider 7A - 7P or covering provider during after hours 7P -7A, for this patient?  Check the care team in [facility] and look for a) attending/consulting TR[facility] provider listed and b) the [facility] team listed. Page or secure chat 7A-7P. Log into [email or web address] and use [place]'s universal password to access. If you do not have the password, please contact the hospital operator. Locate the [facility] provider you are looking for under [facility] and page to a number that you can be directly reached. If you still have difficulty reaching the provider, please page the [facility] (Director on Call) for the Hospitalists listed on amion for assistance.

## 2023-04-11 NOTE — Discharge Instructions (Signed)
Orthopaedic Surgery Discharge Instructions  Surgery: Left hip hemiarthroplasty for femoral neck hip fracture  Weight bearing: Weight bearing as tolerated using a walker  Dressings/Incisions: Keep clean and dry at all times. If dressings become wet or soiled, they should be changed with regular dry gauze or a clean bandage. It is OK to shower, gently pat incision or dressing dry afterwards. Leave steri-strips in place until they fall off on their own. DO NOT put anything on your incisions (cream, ointment, lotion, etc).   Follow up appointment: You are scheduled to follow up with Dr. Thad Ranger. If you do not know when your follow up appointment is, please call [phone] to schedule your appointment. Our office is located at [address] [place], [place], [address].  To reach our office with any questions or concerns please call [phone].

## 2023-04-11 NOTE — Progress Notes (Signed)
Pt refuses to eat or attempt to eat. Education given.

## 2023-04-11 NOTE — TOC CAGE-AID Note (Signed)
Transition of Care [facility]) - CAGE-AID Screening   Patient Details  Name: Gabrielle Hoffman MRN: [id number] Date of Birth: Jun 13, 1931  Transition of Care [facility]) CM/SW Contact:    Janora Norlander, RN Phone Number: [phone] 04/11/2023, 6:34 PM   Clinical Narrative: Pt here after having a mechanical fall at home resulting in a L hip fracture. Pt has significant history of dementia and is confused.  Screening cannot be completed.    CAGE-AID Screening: Substance Abuse Screening unable to be completed due to: : Patient unable to participate

## 2023-04-11 NOTE — Evaluation (Signed)
Physical Therapy Evaluation Patient Details Name: Gabrielle Hoffman MRN: [id number] DOB: 04/10/1931 Today's Date: 04/11/2023  History of Present Illness  The pt is a 89 yo female presenting after a fall at home resulting in L hip pain. Pt found to have L hip fx and possible UTI. She is now s/p L hip hemiarthroplasty and is WBAT on LLE. PMH includes: pelvic fx, dementia, HLD, HTN, hyponatremia, asthma, depression, and PPM.   Clinical Impression  Gabrielle Hoffman is a 89 y.o. female POD 1 s/p Lt hip hemi secondary to fall at home. Patient is now limited by functional impairments (see PT problem list below). Patient unable to provide accurate history or PLOF due to confusion and cognitive status; pt requires re-orientation to situation and surgery. Currently pt needs Max-Total A +2 for bed mobility and to maintain seated balance at EOB. Pt declined any attempts at OOB mobility today repeating "I just don't feel well". Patient will benefit from continued skilled PT interventions to address impairments and progress towards PLOF. Acute PT will follow to progress mobility as able. Patient will benefit from continued inpatient follow up therapy, <3 hours/day.      If plan is discharge home, recommend the following: Two people to help with walking and/or transfers;Two people to help with bathing/dressing/bathroom;Assistance with cooking/housework;Direct supervision/assist for medications management;Assist for transportation;Help with stairs or ramp for entrance;Supervision due to cognitive status   Can travel by private vehicle   No    Equipment Recommendations  (TBA)  Recommendations for Other Services       Functional Status Assessment Patient has had a recent decline in their functional status and/or demonstrates limited ability to make significant improvements in function in a reasonable and predictable amount of time     Precautions / Restrictions Precautions Precautions:  Fall Restrictions Weight Bearing Restrictions: No LLE Weight Bearing: Weight bearing as tolerated      Mobility  Bed Mobility Overal bed mobility: Needs Assistance Bed Mobility: Supine to Sit, Sit to Supine     Supine to sit: +2 for physical assistance, Total assist, +2 for safety/equipment, HOB elevated Sit to supine: +2 for physical assistance, +2 for safety/equipment, Total assist   General bed mobility comments: cues for pt to use bed rails-helicopter method used due to lack of BLE strength and movement, Total A to maintain static sitting balance with one UE support on rail. heavy posterior lean.    Transfers                        Ambulation/Gait                  Stairs            Wheelchair Mobility     Tilt Bed    Modified Rankin (Stroke Patients Only)       Balance Overall balance assessment: Needs assistance Sitting-balance support: Single extremity supported, Feet supported Sitting balance-Leahy Scale: Zero                                       Pertinent Vitals/Pain Pain Assessment Pain Assessment: Faces Faces Pain Scale: Hurts whole lot Pain Location: L hip with movement Pain Descriptors / Indicators: Aching, Sore, Discomfort, Grimacing, Guarding Pain Intervention(s): Limited activity within patient's tolerance, Monitored during session, Repositioned, Patient requesting pain meds-RN notified    Home Living Family/patient expects to be  discharged to:: Private residence Living Arrangements: Alone Available Help at Discharge: Family Type of Home: House         Home Layout: One level   Additional Comments: Further info to be obtained from son "Vonna Kotyk"    Prior Function                       Extremity/Trunk Assessment   Upper Extremity Assessment Upper Extremity Assessment: Defer to OT evaluation    Lower Extremity Assessment Lower Extremity Assessment: Generalized weakness    Cervical /  Trunk Assessment Cervical / Trunk Assessment: Kyphotic  Communication   Communication Communication: Difficulty following commands/understanding;Difficulty communicating thoughts/reduced clarity of speech Following commands: Follows one step commands inconsistently;Follows one step commands with increased time Cueing Techniques: Verbal cues;Tactile cues;Visual cues  Cognition Arousal: Alert Behavior During Therapy: Flat affect, WFL for tasks assessed/performed Overall Cognitive Status: Impaired/Different from baseline Area of Impairment: Orientation, Following commands, Problem solving, Awareness                 Orientation Level: Disoriented to, Situation, Time     Following Commands: Follows one step commands inconsistently, Follows one step commands with increased time   Awareness: Intellectual Problem Solving: Slow processing, Requires verbal cues, Requires tactile cues, Decreased initiation, Difficulty sequencing General Comments: Pt cannot recall her birthday but knows her name, with cueing able to come to conclusion that she is in the hopsital but unawre why. requires repeated and frequent re-orientation throughout.        General Comments General comments (skin integrity, edema, etc.): VSS on RA    Exercises     Assessment/Plan    PT Assessment Patient needs continued PT services  PT Problem List Decreased range of motion;Decreased strength;Decreased activity tolerance;Decreased balance;Decreased mobility;Decreased coordination;Decreased cognition;Decreased knowledge of use of DME;Decreased safety awareness;Decreased knowledge of precautions;Cardiopulmonary status limiting activity;Impaired sensation;Pain       PT Treatment Interventions DME instruction;Gait training;Stair training;Functional mobility training;Therapeutic activities;Therapeutic exercise;Balance training;Neuromuscular re-education;Cognitive remediation;Patient/family education;Wheelchair mobility  training;Manual techniques    PT Goals (Current goals can be found in the Care Plan section)  Acute Rehab PT Goals Patient Stated Goal: pt did not state PT Goal Formulation: Patient unable to participate in goal setting Time For Goal Achievement: 04/25/23 Potential to Achieve Goals: Fair    Frequency Min 1X/week     Co-evaluation PT/OT/SLP Co-Evaluation/Treatment: Yes Reason for Co-Treatment: Complexity of the patient's impairments (multi-system involvement);For patient/therapist safety;To address functional/ADL transfers PT goals addressed during session: Mobility/safety with mobility;Balance OT goals addressed during session: ADL's and self-care       AM-PAC PT "6 Clicks" Mobility  Outcome Measure Help needed turning from your back to your side while in a flat bed without using bedrails?: Total Help needed moving from lying on your back to sitting on the side of a flat bed without using bedrails?: Total Help needed moving to and from a bed to a chair (including a wheelchair)?: Total Help needed standing up from a chair using your arms (e.g., wheelchair or bedside chair)?: Total Help needed to walk in hospital room?: Total Help needed climbing 3-5 steps with a railing? : Total 6 Click Score: 6    End of Session   Activity Tolerance: Patient tolerated treatment well Patient left: in bed;with call bell/phone within reach;with bed alarm set Nurse Communication: Mobility status;Patient requests pain meds PT Visit Diagnosis: Other abnormalities of gait and mobility (R26.89);Unsteadiness on feet (R26.81);Muscle weakness (generalized) (M62.81);Difficulty in walking, not elsewhere classified (R26.2);Other  symptoms and signs involving the nervous system (R29.898);History of falling (Z91.81);Pain Pain - Right/Left: Left Pain - part of body: Hip    Time: 1610-9604 PT Time Calculation (min) (ACUTE ONLY): 22 min   Charges:   PT Evaluation $PT Eval Moderate Complexity: 1 Mod   PT  General Charges $$ ACUTE PT VISIT: 1 Visit         Wynn Maudlin, DPT Acute Rehabilitation Services Office [phone]  04/11/23 2:31 PM

## 2023-04-12 DIAGNOSIS — S72002D Fracture of unspecified part of neck of left femur, subsequent encounter for closed fracture with routine healing: Secondary | ICD-10-CM | POA: Diagnosis not present

## 2023-04-12 LAB — CBC WITH DIFFERENTIAL/PLATELET
Abs Immature Granulocytes: 0.09 10*3/uL — ABNORMAL HIGH (ref 0.00–0.07)
Basophils Absolute: 0 10*3/uL (ref 0.0–0.1)
Basophils Relative: 0 %
Eosinophils Absolute: 0 10*3/uL (ref 0.0–0.5)
Eosinophils Relative: 0 %
HCT: 29.3 % — ABNORMAL LOW (ref 36.0–46.0)
Hemoglobin: 9.4 g/dL — ABNORMAL LOW (ref 12.0–15.0)
Immature Granulocytes: 1 %
Lymphocytes Relative: 8 %
Lymphs Abs: 0.9 10*3/uL (ref 0.7–4.0)
MCH: 29.7 pg (ref 26.0–34.0)
MCHC: 32.1 g/dL (ref 30.0–36.0)
MCV: 92.7 fL (ref 80.0–100.0)
Monocytes Absolute: 1.6 10*3/uL — ABNORMAL HIGH (ref 0.1–1.0)
Monocytes Relative: 14 %
Neutro Abs: 8.8 10*3/uL — ABNORMAL HIGH (ref 1.7–7.7)
Neutrophils Relative %: 77 %
Platelets: 245 10*3/uL (ref 150–400)
RBC: 3.16 MIL/uL — ABNORMAL LOW (ref 3.87–5.11)
RDW: 14.6 % (ref 11.5–15.5)
WBC: 11.5 10*3/uL — ABNORMAL HIGH (ref 4.0–10.5)
nRBC: 0 % (ref 0.0–0.2)

## 2023-04-12 LAB — URINE CULTURE: Culture: >=100000 — AB

## 2023-04-12 LAB — BASIC METABOLIC PANEL
Anion gap: 9 (ref 5–15)
BUN: 13 mg/dL (ref 8–23)
CO2: 21 mmol/L — ABNORMAL LOW (ref 22–32)
Calcium: 8.3 mg/dL — ABNORMAL LOW (ref 8.9–10.3)
Chloride: 103 mmol/L (ref 98–111)
Creatinine, Ser: 0.79 mg/dL (ref 0.44–1.00)
GFR, Estimated: >60 mL/min (ref >60)
Glucose, Bld: 117 mg/dL — ABNORMAL HIGH (ref 70–99)
Potassium: 3.7 mmol/L (ref 3.5–5.1)
Sodium: 133 mmol/L — ABNORMAL LOW (ref 135–145)

## 2023-04-12 MED ORDER — ACETAMINOPHEN 325 MG PO TABS
650.0000 mg | ORAL_TABLET | Freq: Four times a day (QID) | ORAL | Status: DC | PRN
  Administered 2023-04-12 – 2023-04-13 (×4): 650 mg via ORAL
  Filled 2023-04-12 (×5): qty 2

## 2023-04-12 MED ORDER — PIPERACILLIN-TAZOBACTAM 3.375 G IVPB
3.3750 g | Freq: Three times a day (TID) | INTRAVENOUS | Status: DC
  Administered 2023-04-12 – 2023-04-13 (×4): 3.375 g via INTRAVENOUS
  Filled 2023-04-12 (×4): qty 50

## 2023-04-12 MED ORDER — SODIUM CHLORIDE 0.9 % IV SOLN: INTRAVENOUS | Status: AC

## 2023-04-12 MED ORDER — SODIUM CHLORIDE 0.9 % IV SOLN: 3.3750 g | Freq: Three times a day (TID) | INTRAVENOUS | Status: DC

## 2023-04-12 MED ORDER — ENSURE ENLIVE PO LIQD
237.0000 mL | Freq: Two times a day (BID) | ORAL | Status: DC
  Administered 2023-04-12 – 2023-04-15 (×6): 237 mL via ORAL

## 2023-04-12 NOTE — TOC Initial Note (Signed)
Transition of Care [facility]) - Initial/Assessment Note    Patient Details  Name: Gabrielle Hoffman MRN: [id number] Date of Birth: 08/26/1930  Transition of Care [facility]) CM/SW Contact:    Baldemar Lenis, LCSW Phone Number: 04/12/2023, 11:51 AM  Clinical Narrative:      CSW spoke with patient's son, Vonna Kotyk, to discuss recommendation for SNF. Vonna Kotyk in agreement. Patient has been to [facility] in the past and Vonna Kotyk would prefer looking at options that would be closer to home for him. CSW discussed SNF options, presented Vonna Kotyk with choice list. Lengthy discussion had with Vonna Kotyk regarding SNF placement, answering questions and discussing options available. Vonna Kotyk also asking medical questions, CSW indicated that he would have to ask the MD or RN those questions. CSW completed referral and faxed out to SNF, awaiting bed offers.             Expected Discharge Plan: Skilled Nursing Facility Barriers to Discharge: Continued Medical Work up, English as a second language teacher   Patient Goals and CMS Choice Patient states their goals for this hospitalization and ongoing recovery are:: patient unable to participate in goal setting, not fully oriented CMS Medicare.gov Compare Post Acute Care list provided to:: Patient Represenative (must comment) Choice offered to / list presented to : Adult Children  [place] ownership interest in [facility].provided to:: Adult Children    Expected Discharge Plan and Services     Post Acute Care Choice: Skilled Nursing Facility Living arrangements for the past 2 months: Single Family Home                                      Prior Living Arrangements/Services Living arrangements for the past 2 months: Single Family Home Lives with:: Adult Children Patient language and need for interpreter reviewed:: No Do you feel safe going back to the place where you live?: Yes      Need for Family Participation in Patient Care: Yes (Comment) Care giver support system in  place?: No (comment) Current home services: DME Criminal Activity/Legal Involvement Pertinent to Current Situation/Hospitalization: No - Comment as needed  Activities of Daily Living      Permission Sought/Granted Permission sought to share information with : Facility Medical sales representative, Family Supports Permission granted to share information with : Yes, Verbal Permission Granted  Share Information with NAME: Vonna Kotyk  Permission granted to share info w AGENCY: SNF  Permission granted to share info w Relationship: Son     Emotional Assessment   Attitude/Demeanor/Rapport: Unable to Assess Affect (typically observed): Unable to Assess Orientation: : Oriented to Self Alcohol / Substance Use: Not Applicable Psych Involvement: No (comment)  Admission diagnosis:  Hip fracture (HCC) [S72.009A] Closed fracture of left hip, initial encounter (HCC) [S72.002A] Patient Active Problem List   Diagnosis Date Noted   Hyperthyroidism 04/11/2023   Hip fracture (HCC) 04/10/2023   Lung nodule 06/24/2020   Pelvic fracture (HCC) 06/24/2020   Nausea    Weight loss    Fracture of multiple pubic rami, right, closed, initial encounter (HCC) 06/23/2020   Fall at home, initial encounter 06/23/2020   Fracture of lumbar spine (HCC) 06/23/2020   Pacemaker 04/03/2020   Intermittent complete heart block (HCC) 11/19/2015   Chronic systolic heart failure (HCC)    Cognitive change 11/18/2015   Arrhythmia 11/18/2015   Syncope and collapse 11/18/2015   Syncope 07/11/2015   Low back pain 12/19/2011   Hyponatremia 12/17/2011  Dehydration 12/17/2011   Lower urinary tract infectious disease 12/17/2011   HTN (hypertension) 12/17/2011   Dementia (HCC) 12/17/2011   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 [place] [address] Phone: [phone] Fax: [phone]     Social Determinants of Health (SDOH) Social History: SDOH Screenings   Tobacco Use: Low Risk  (04/10/2023)   SDOH Interventions:     Readmission Risk Interventions     No data to display

## 2023-04-12 NOTE — Transfer of Care (Signed)
Immediate Anesthesia Transfer of Care Note  Patient: Gabrielle Hoffman  Procedure(s) Performed: HEMIARTHROPLASTY LEFT HIP (Left: Hip)  Patient Location: PACU  Anesthesia Type:General  Level of Consciousness: awake and alert   Airway & Oxygen Therapy: Patient Spontanous Breathing  Post-op Assessment: Report given to RN and Post -op Vital signs reviewed and stable  Post vital signs: Reviewed and stable  Last Vitals:  Vitals Value Taken Time  BP 137/57 04/12/23 0531  Temp 36.8 C 04/12/23 0531  Pulse 88 04/12/23 0743  Resp 16 04/12/23 0743  SpO2 100 % 04/12/23 0743  Vitals shown include unfiled device data.  Last Pain:  Vitals:   04/12/23 0531  TempSrc: Oral  PainSc:          Complications: There were no known notable events for this encounter.

## 2023-04-12 NOTE — Progress Notes (Addendum)
Pharmacy Antibiotic Note  Gabrielle Hoffman is a 89 y.o. female admitted on 04/09/2023 with UTI. Patient's renal function is stable, afeb, WBC is trending down. Patient was previously on Rocephin and her urine culture is growing pseudomonas. Pharmacy has been consulted for Zosyn dosing.   Plan: Zosyn 3.375g IV q8h (4 hour infusion). Monitor daily CBC and Scr F/u LOT   Height: 5\' 6"  (167.6 cm) Weight: 49.9 kg (110 lb) (8/13) IBW/kg (Calculated) : 59.3  Temp (24hrs), Avg:98.3 F (36.8 C), Min:98.1 F (36.7 C), Max:98.5 F (36.9 C)  Recent Labs  Lab 04/10/23 0001 04/10/23 0948 04/11/23 0800 04/12/23 0454  WBC 13.7* 13.9* 13.4* 11.5*  CREATININE 0.87 0.80 0.77 0.79    Estimated Creatinine Clearance: 36.8 mL/min (by C-G formula based on SCr of 0.79 mg/dL).    Allergies  Allergen Reactions   Carvedilol Other (See Comments), Itching and Rash   Sulfa Antibiotics Rash   Sulfamethoxazole Rash    Antimicrobials this admission: Ceftriaxone  9/6>> 9/8 Zosyn  9/8 >>  Cefazolin 9/6 x1  Dose adjustments this admission: N/a  Microbiology results: 9/6 UCx: pseudomonas aeruginosa   9/6 MRSA PCR: (+)  Thank you for allowing pharmacy to be a part of this patient's care.  Merla Riches 04/12/2023 10:58 AM

## 2023-04-12 NOTE — NC FL2 (Signed)
[place] MEDICAID FL2 LEVEL OF CARE FORM     IDENTIFICATION  Patient Name: Gabrielle Hoffman Birthdate: 02/27/1931 Sex: female Admission Date (Current Location): 04/09/2023  [facility] and [place] Number:  Producer, television/film/video and Address:  [facility], [address] [place], [place] [address]      Provider Number: [id number]  Attending Physician Name and Address:  Hughie Closs, MD  Relative Name and Phone Number:       Current Level of Care: Hospital Recommended Level of Care: Skilled Nursing Facility Prior Approval Number:    Date Approved/Denied:   PASRR Number: [id number]  Discharge Plan: SNF    Current Diagnoses: Patient Active Problem List   Diagnosis Date Noted   Hyperthyroidism 04/11/2023   Hip fracture (HCC) 04/10/2023   Lung nodule 06/24/2020   Pelvic fracture (HCC) 06/24/2020   Nausea    Weight loss    Fracture of multiple pubic rami, right, closed, initial encounter (HCC) 06/23/2020   Fall at home, initial encounter 06/23/2020   Fracture of lumbar spine (HCC) 06/23/2020   Pacemaker 04/03/2020   Intermittent complete heart block (HCC) 11/19/2015   Chronic systolic heart failure (HCC)    Cognitive change 11/18/2015   Arrhythmia 11/18/2015   Syncope and collapse 11/18/2015   Syncope 07/11/2015   Low back pain 12/19/2011   Hyponatremia 12/17/2011   Dehydration 12/17/2011   Lower urinary tract infectious disease 12/17/2011   HTN (hypertension) 12/17/2011   Dementia (HCC) 12/17/2011   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/12/2011   Open angle with borderline findings, low risk 05/12/2011   Dermatochalasis 05/12/2011    Orientation RESPIRATION BLADDER Height & Weight     Self  Normal Incontinent Weight: 110 lb (49.9 kg) (8/13) Height:  5\' 6"  (167.6 cm)  BEHAVIORAL SYMPTOMS/MOOD NEUROLOGICAL BOWEL NUTRITION STATUS      Incontinent Diet (heart healthy)  AMBULATORY STATUS COMMUNICATION OF NEEDS Skin   Extensive Assist Verbally  Surgical wounds (closed left hip, silver hydrofiber dressing)                       Personal Care Assistance Level of Assistance  Bathing, Feeding, Dressing Bathing Assistance: Maximum assistance Feeding assistance: Limited assistance Dressing Assistance: Maximum assistance     Functional Limitations Info  Hearing   Hearing Info: Impaired      SPECIAL CARE FACTORS FREQUENCY  PT (By licensed PT), OT (By licensed OT)     PT Frequency: 5x/wk OT Frequency: 5x/wk            Contractures Contractures Info: Not present    Additional Factors Info  Code Status, Allergies Code Status Info: DNR-Limited Allergies Info: Carvedilol, Sulfa Antibiotics, Sulfamethoxazole           Current Medications (04/12/2023):  This is the current hospital active medication list Current Facility-Administered Medications  Medication Dose Route Frequency Provider Last Rate Last Admin   acetaminophen (TYLENOL) tablet 650 mg  650 mg Oral Q6H PRN Pahwani, Daleen Bo, MD       bisoprolol (ZEBETA) tablet 5 mg  5 mg Oral Daily Pahwani, Ravi, MD   5 mg at 04/12/23 1028   chlorhexidine (HIBICLENS) 4 % liquid 4 Application  60 mL Topical Once Rosalee Kaufman, MD       Chlorhexidine Gluconate Cloth 2 % PADS 6 each  6 each Topical Q0600 Rosalee Kaufman, MD   6 each at 04/12/23 1029   feeding supplement (ENSURE ENLIVE / ENSURE PLUS) liquid 237 mL  237 mL Oral BID BM Hughie Closs, MD   237 mL at 04/12/23 1029   heparin injection 5,000 Units  5,000 Units Subcutaneous Q8H Rosalee Kaufman, MD   5,000 Units at 04/12/23 0515   HYDROcodone-acetaminophen (NORCO/VICODIN) 5-325 MG per tablet 1-2 tablet  1-2 tablet Oral Q6H PRN Rosalee Kaufman, MD   2 tablet at 04/10/23 0433   methIMAzole (TAPAZOLE) tablet 2.5 mg  2.5 mg Oral Daily Pahwani, Daleen Bo, MD   2.5 mg at 04/12/23 1028   methocarbamol (ROBAXIN) tablet 500 mg  500 mg Oral Q6H PRN Rosalee Kaufman, MD   500 mg at 04/12/23 1028   Or   methocarbamol (ROBAXIN) 500 mg in  dextrose 5 % 50 mL IVPB  500 mg Intravenous Q6H PRN Rosalee Kaufman, MD       mupirocin ointment (BACTROBAN) 2 % 1 Application  1 Application Nasal BID Rosalee Kaufman, MD   1 Application at 04/12/23 1028   pantoprazole (PROTONIX) EC tablet 40 mg  40 mg Oral QAC supper Rosalee Kaufman, MD   40 mg at 04/11/23 1650   piperacillin-tazobactam (ZOSYN) IVPB 3.375 g  3.375 g Intravenous Q8H Williams, Diamond L, RPH       povidone-iodine 10 % swab 2 Application  2 Application Topical Once Rosalee Kaufman, MD       scopolamine (TRANSDERM-SCOP) 1 MG/3DAYS 1.5 mg  1 patch Transdermal Once Anthoney Harada, NP   1.5 mg at 04/10/23 2227   senna-docusate (Senokot-S) tablet 1 tablet  1 tablet Oral QHS PRN Rosalee Kaufman, MD   1 tablet at 04/11/23 2103     Discharge Medications: Please see discharge summary for a list of discharge medications.  Relevant Imaging Results:  Relevant Lab Results:   Additional Information SS#: [id number]  Baldemar Lenis, LCSW

## 2023-04-12 NOTE — Progress Notes (Addendum)
PROGRESS NOTE    Gabrielle Hoffman  VWU:[id number] DOB: 07/13/1931 DOA: 04/09/2023 PCP: Farris Has, MD   Brief Narrative:  HPI: Gabrielle Hoffman is a 89 y.o. female with medical history significant of  Dementia,HLD, HTN, hyponatremia,asthma, DJD lumbar spine, depression who presents to ED have mechanical fall at home with resulting hip pain. Per family patient came in from a walk and tripped on a rug. Patient currently states mild pain with lying still however with movement has intense pain in left leg.  ON ros she notes no chest pain, n/v//d/abdominal pain/ fever or chills.    ED Course:  Afeb, bp 189/83, hr 103, rr 12, sat 100% on ra   Assessment & Plan:   Principal Problem:   Hip fracture (HCC) Active Problems:   Lower urinary tract infectious disease   HTN (hypertension)   Dementia (HCC)   Hyperthyroidism  Acute superiorly displaced left femoral neck fracture: Orthopedic consulted, s/p surgical repair on 04/10/2023.  Doing well postoperatively.  Management per orthopedics.  Seen by PT OT, SNF recommended.  TOC consulted.  Patient is medically stable.  UTI: Urine culture growing Pseudomonas, almost pansensitive.  Will transition her from Rocephin to Zosyn today.  Dementia: At baseline.  She is alert and oriented to self only.  Hyperlipidemia: Diet controlled.  Hyperthyroidism: Continue methimazole.  Essential hypertension: Continue bisoprolol.  Controlled.  Mild intermittent asthma: Stable.  As needed bronchodilators.  Depression: Continue home medications.  Mild hyponatremia: Monitor.  GERD: PPI.  Urinary retention: Per nurse, she had retained urine last night and they had to do straight cath.  However since then, patient has voided multiple times.  I addressed this concern raised by son and her niece as well.  DVT prophylaxis: heparin injection 5,000 Units Start: 04/11/23 2200 SCDs Start: 04/10/23 0400   Code Status: Limited: Do not attempt resuscitation (DNR)  -DNR-LIMITED -Do Not Intubate/DNI   Family Communication:  None present at bedside.  Called and discussed with patient's son Vonna Kotyk over the phone.  Answered multiple questions and spent about 7 minutes and then he requested to call his cousin Eunice Blase.  She talked to her for about 5 minutes and answered several questions yet again, most of them were duplicate.  She was requesting that she thinks that patient should not be discharged tomorrow and should stay here for couple more days.  Status is: Inpatient Remains inpatient appropriate because: Patient medically stable, needs placement to SNF.  TOC consulted.   Estimated body mass index is 17.75 kg/m as calculated from the following:   Height as of this encounter: 5\' 6"  (1.676 m).   Weight as of 03/17/23: 49.9 kg.    Nutritional Assessment: Body mass index is 17.75 kg/m.Marland Kitchen Seen by dietician.  I agree with the assessment and plan as outlined below: Nutrition Status: Nutrition Problem: Inadequate oral intake Etiology: inability to eat Signs/Symptoms: NPO status Interventions: Refer to RD note for recommendations  . Skin Assessment: I have examined the patient's skin and I agree with the wound assessment as performed by the wound care RN as outlined below:    Consultants:  Orthopedics  Procedures:  As above  Antimicrobials:  Anti-infectives (From admission, onward)    Start     Dose/Rate Route Frequency Ordered Stop   04/10/23 2039  vancomycin (VANCOCIN) powder  Status:  Discontinued          As needed 04/10/23 2040 04/10/23 2043   04/10/23 1200  ceFAZolin (ANCEF) IVPB 2g/100 mL premix  2 g 200 mL/hr over 30 Minutes Intravenous On call to O.R. 04/10/23 1104 04/10/23 1851   04/10/23 1200  vancomycin (VANCOCIN) IVPB 1000 mg/200 mL premix        1,000 mg 200 mL/hr over 60 Minutes Intravenous On call to O.R. 04/10/23 1104 04/10/23 1654   04/10/23 0500  cefTRIAXone (ROCEPHIN) 1 g in sodium chloride 0.9 % 100 mL IVPB        1  g 200 mL/hr over 30 Minutes Intravenous Every 24 hours 04/10/23 0403           Subjective: Patient examined.  Appears comfortable, she is alert and oriented to self at her baseline.  Denies any complaint.  Objective: Vitals:   04/11/23 1459 04/11/23 2108 04/12/23 0531 04/12/23 0826  BP: (!) 143/88 129/71 (!) 137/57   Pulse: 78 (!) 58 92   Resp:  18 12   Temp: 98.1 F (36.7 C) 98.3 F (36.8 C) 98.2 F (36.8 C) 98.5 F (36.9 C)  TempSrc: Oral Oral Oral Oral  SpO2: 95% 100% 100%   Height:        Intake/Output Summary (Last 24 hours) at 04/12/2023 1030 Last data filed at 04/12/2023 0600 Gross per 24 hour  Intake --  Output 1100 ml  Net -1100 ml   There were no vitals filed for this visit.  Examination:  General exam: Appears calm and comfortable  Respiratory system: Clear to auscultation. Respiratory effort normal. Cardiovascular system: S1 & S2 heard, RRR. No JVD, murmurs, rubs, gallops or clicks. No pedal edema. Gastrointestinal system: Abdomen is nondistended, soft and nontender. No organomegaly or masses felt. Normal bowel sounds heard. Central nervous system: Alert and oriented x 1. No focal neurological deficits. Skin: No rashes, lesions or ulcers.    Data Reviewed: I have personally reviewed following labs and imaging studies  CBC: Recent Labs  Lab 04/10/23 0001 04/10/23 0948 04/11/23 0800 04/12/23 0454  WBC 13.7* 13.9* 13.4* 11.5*  NEUTROABS 11.5*  --   --  8.8*  HGB 11.6* 12.5 10.5* 9.4*  HCT 35.3* 37.9 32.0* 29.3*  MCV 93.1 90.0 91.2 92.7  PLT 344 285 281 245   Basic Metabolic Panel: Recent Labs  Lab 04/10/23 0001 04/10/23 0948 04/11/23 0800 04/12/23 0454  NA 137 134* 132* 133*  K 3.8 3.8 4.1 3.7  CL 97* 97* 99 103  CO2 24 21* 22 21*  GLUCOSE 160* 119* 107* 117*  BUN 16 11 10 13   CREATININE 0.87 0.80 0.77 0.79  CALCIUM 9.7 9.1 8.1* 8.3*   GFR: CrCl cannot be calculated (Unknown ideal weight.). Liver Function Tests: No results for  input(s): "AST", "ALT", "ALKPHOS", "BILITOT", "PROT", "ALBUMIN" in the last 168 hours. No results for input(s): "LIPASE", "AMYLASE" in the last 168 hours. No results for input(s): "AMMONIA" in the last 168 hours. Coagulation Profile: No results for input(s): "INR", "PROTIME" in the last 168 hours. Cardiac Enzymes: No results for input(s): "CKTOTAL", "CKMB", "CKMBINDEX", "TROPONINI" in the last 168 hours. BNP (last 3 results) No results for input(s): "PROBNP" in the last 8760 hours. HbA1C: No results for input(s): "HGBA1C" in the last 72 hours. CBG: No results for input(s): "GLUCAP" in the last 168 hours. Lipid Profile: No results for input(s): "CHOL", "HDL", "LDLCALC", "TRIG", "CHOLHDL", "LDLDIRECT" in the last 72 hours. Thyroid Function Tests: No results for input(s): "TSH", "T4TOTAL", "FREET4", "T3FREE", "THYROIDAB" in the last 72 hours. Anemia Panel: No results for input(s): "VITAMINB12", "FOLATE", "FERRITIN", "TIBC", "IRON", "RETICCTPCT" in the last 72 hours. Sepsis  Labs: No results for input(s): "PROCALCITON", "LATICACIDVEN" in the last 168 hours.  Recent Results (from the past 240 hour(s))  Urine Culture (for pregnant, neutropenic or urologic patients or patients with an indwelling urinary catheter)     Status: Abnormal   Collection Time: 04/10/23  4:04 AM   Specimen: Urine, Clean Catch  Result Value Ref Range Status   Specimen Description URINE, CLEAN CATCH  Final   Special Requests   Final    NONE Performed at [facility], [address], [place], [place] [address]    Culture >=100,000 COLONIES/mL PSEUDOMONAS AERUGINOSA (A)  Final   Report Status 04/12/2023 FINAL  Final   Organism ID, Bacteria PSEUDOMONAS AERUGINOSA (A)  Final      Susceptibility   Pseudomonas aeruginosa - MIC*    CEFTAZIDIME <=1 SENSITIVE Sensitive     CIPROFLOXACIN <=0.25 SENSITIVE Sensitive     GENTAMICIN 4 SENSITIVE Sensitive     IMIPENEM 2 SENSITIVE Sensitive     PIP/TAZO <=4 SENSITIVE  Sensitive     * >=100,000 COLONIES/mL PSEUDOMONAS AERUGINOSA  Surgical pcr screen     Status: Abnormal   Collection Time: 04/10/23  4:16 AM   Specimen: Nasal Mucosa; Nasal Swab  Result Value Ref Range Status   MRSA, PCR POSITIVE (A) NEGATIVE Final    Comment: RESULT CALLED TO, READ BACK BY AND VERIFIED WITH: OPOKU RN 04/10/23 @ 0616 BY AB    Staphylococcus aureus POSITIVE (A) NEGATIVE Final    Comment: (NOTE) The Xpert SA Assay (FDA approved for NASAL specimens in patients 86 years of age and older), is one component of a comprehensive surveillance program. It is not intended to diagnose infection nor to guide or monitor treatment. Performed at [facility], [address], [place], [place] [address]      Radiology Studies: DG Pelvis Portable  Result Date: 04/11/2023 CLINICAL DATA:  Postop hip fracture EXAM: PORTABLE PELVIS 1-2 VIEWS COMPARISON:  04/10/2023 FINDINGS: Interval left hip replacement with intact hardware and normal alignment. Gas in the soft tissues consistent with recent surgery. Old right inferior pubic ramus fracture. IMPRESSION: Status post left hip replacement. Electronically Signed   By: Jasmine Pang M.D.   On: 04/11/2023 00:04   DG HIP UNILAT WITH PELVIS 2-3 VIEWS LEFT  Result Date: 04/11/2023 CLINICAL DATA:  Elective surgery EXAM: DG HIP (WITH OR WITHOUT PELVIS) 2-3V LEFT COMPARISON:  04/10/2023 FINDINGS: Three intraoperative views of the left hip were obtained during operative course of left hip replacement. Old appearing right inferior pubic ramus fracture. IMPRESSION: Intraoperative views of the left hip during operative course of left hip replacement. Electronically Signed   By: Jasmine Pang M.D.   On: 04/11/2023 00:03    Scheduled Meds:  bisoprolol  5 mg Oral Daily   chlorhexidine  60 mL Topical Once   Chlorhexidine Gluconate Cloth  6 each Topical Q0600   feeding supplement  237 mL Oral BID BM   heparin  5,000 Units Subcutaneous Q8H   methimazole  2.5  mg Oral Daily   mupirocin ointment  1 Application Nasal BID   pantoprazole  40 mg Oral QAC supper   povidone-iodine  2 Application Topical Once   scopolamine  1 patch Transdermal Once   Continuous Infusions:  cefTRIAXone (ROCEPHIN)  IV 1 g (04/12/23 0515)   methocarbamol (ROBAXIN) IV       LOS: 2 days   Hughie Closs, MD [facility]  04/12/2023, 10:30 AM   *Please note that  this is a verbal dictation therefore any spelling or grammatical errors are due to the "Dragon Medical One" system interpretation.  Please page via Amion and do not message via secure chat for urgent patient care matters. Secure chat can be used for non urgent patient care matters.  How to contact the [facility] Attending or Consulting provider 7A - 7P or covering provider during after hours 7P -7A, for this patient?  Check the care team in [facility] and look for a) attending/consulting TR[facility] provider listed and b) the [facility] team listed. Page or secure chat 7A-7P. Log into [email or web address] and use 's universal password to access. If you do not have the password, please contact the hospital operator. Locate the [facility] provider you are looking for under [facility] and page to a number that you can be directly reached. If you still have difficulty reaching the provider, please page the [facility] (Director on Call) for the Hospitalists listed on amion for assistance.

## 2023-04-12 NOTE — Plan of Care (Signed)

## 2023-04-12 NOTE — Progress Notes (Signed)
Orthopaedic Surgery Progress Note  Feeling well this afternoon.  SNF placement pending  Exam: Left lower extremity: Dressings clean dry and intact Intact ankle dorsiflexion plantarflexion Sensation grossly intact to light touch Foot is warm and well-perfused  Assessment: 89 y.o. female 2 Days Post-Op status post left hip hemiarthroplasty for displaced femoral neck fracture  Plan: Weightbearing as tolerated with a walker, no hip precautions Dressing: Leave Aquacel in place until follow-up, okay to change with regular dry gauze if it becomes soiled OK to resume DVT chemoppx or anticoagulation on post op day 1; recommend 4 weeks of chemoppx with ASA 81 BID if no other AC is in place Discharge instructions placed in Epic

## 2023-04-13 DIAGNOSIS — S72001D Fracture of unspecified part of neck of right femur, subsequent encounter for closed fracture with routine healing: Secondary | ICD-10-CM | POA: Diagnosis not present

## 2023-04-13 LAB — TSH: TSH: 1.991 u[IU]/mL (ref 0.350–4.500)

## 2023-04-13 MED ORDER — POLYETHYLENE GLYCOL 3350 17 G PO PACK
17.0000 g | PACK | Freq: Two times a day (BID) | ORAL | Status: DC
  Administered 2023-04-13 – 2023-04-15 (×5): 17 g via ORAL
  Filled 2023-04-13 (×5): qty 1

## 2023-04-13 MED ORDER — CIPROFLOXACIN HCL 500 MG PO TABS
500.0000 mg | ORAL_TABLET | Freq: Two times a day (BID) | ORAL | Status: DC
  Administered 2023-04-13 – 2023-04-15 (×4): 500 mg via ORAL
  Filled 2023-04-13 (×4): qty 1

## 2023-04-13 MED ORDER — FERROUS SULFATE 325 (65 FE) MG PO TABS
325.0000 mg | ORAL_TABLET | Freq: Every day | ORAL | Status: DC
  Administered 2023-04-14 – 2023-04-15 (×2): 325 mg via ORAL
  Filled 2023-04-13 (×2): qty 1

## 2023-04-13 MED ORDER — GUAIFENESIN 100 MG/5ML PO LIQD: 5.0000 mL | ORAL | Status: DC | PRN

## 2023-04-13 MED ORDER — ONDANSETRON HCL 4 MG/2ML IJ SOLN: 4.0000 mg | Freq: Four times a day (QID) | INTRAMUSCULAR | Status: DC | PRN

## 2023-04-13 MED ORDER — HYDRALAZINE HCL 20 MG/ML IJ SOLN: 10.0000 mg | INTRAMUSCULAR | Status: DC | PRN

## 2023-04-13 MED ORDER — OXYCODONE HCL 5 MG PO TABS
5.0000 mg | ORAL_TABLET | ORAL | 0 refills | Status: AC | PRN
Start: 2023-04-13 — End: ?

## 2023-04-13 MED ORDER — TRAZODONE HCL 50 MG PO TABS
50.0000 mg | ORAL_TABLET | Freq: Every evening | ORAL | Status: DC | PRN
  Administered 2023-04-13: 50 mg via ORAL
  Filled 2023-04-13: qty 1

## 2023-04-13 MED ORDER — IPRATROPIUM-ALBUTEROL 0.5-2.5 (3) MG/3ML IN SOLN: 3.0000 mL | RESPIRATORY_TRACT | Status: DC | PRN

## 2023-04-13 NOTE — Care Management Important Message (Signed)
Important Message  Patient Details  Name: Gabrielle Hoffman MRN: [id number] Date of Birth: 05-Nov-1930   Medicare Important Message Given:  Yes     Sherilyn Banker 04/13/2023, 1:19 PM

## 2023-04-13 NOTE — Hospital Course (Addendum)
  Brief Narrative:  89 y.o. female with medical history significant of  Dementia,HLD, HTN, hyponatremia,asthma, DJD lumbar spine, depression who presents to ED have mechanical fall at home with resulting hip pain. Per family patient came in from a walk and tripped on a rug.  Patient ended up with left hip fracture requiring ORIF on 9/6 by orthopedic.  Current recommendations are for SNF.  Urine cultures grew Pseudomonas therefore on antibiotics. Currently awaiting placement.   Assessment & Plan:   Principal Problem:   Hip fracture (HCC) Active Problems:   Lower urinary tract infectious disease   HTN (hypertension)   Dementia (HCC)   Hyperthyroidism   Acute superiorly displaced left femoral neck fracture -Status post ORIF by orthopedic on 9/6.  Postop management, DVT prophylaxis, weightbearing, dressing changes and follow-up per orthopedic.   UTI, Pseudomonas -Cultures are growing Pseudomonas which is sensitive to p.o. Cipro therefore will transition.  Total treatment 5 days   Dementia -Stable at baseline.  Supportive care    Hyperlipidemia -Diet controlled.   Hyperthyroidism -Continue methimazole.   Essential hypertension -Continue bisoprolol.  Controlled.  IV as needed   Mild intermittent asthma -Stable.  As needed bronchodilators.   Depression -Continue home medications.   Mild hyponatremia -Monitor.   GERD -PPI.   Urinary retention -Continue to closely monitor.   DVT prophylaxis:  heparin   Code Status: Limited: Do not attempt resuscitation (DNR) -DNR-LIMITED -Do Not Intubate/DNI   Family Communication: called Vonna Kotyk, no answer  SNF placement.

## 2023-04-13 NOTE — Progress Notes (Signed)
Pharmacy Antibiotic Note  Gabrielle Hoffman is a 89 y.o. female admitted on 04/09/2023 with UTI.  Pharmacy has been consulted for ciprofloxacin dosing.  Plan: D/c zosyn after current dose (running now) Start Cipro this evening Cipro 500mg  PO q 12h x 4 days (to complete 5 day total course for UTI)   Height: 5\' 6"  (167.6 cm) Weight: 49.9 kg (110 lb) (8/13) IBW/kg (Calculated) : 59.3  Temp (24hrs), Avg:98.4 F (36.9 C), Min:98.1 F (36.7 C), Max:98.7 F (37.1 C)  Recent Labs  Lab 04/10/23 0001 04/10/23 0948 04/11/23 0800 04/12/23 0454  WBC 13.7* 13.9* 13.4* 11.5*  CREATININE 0.87 0.80 0.77 0.79    Estimated Creatinine Clearance: 36.8 mL/min (by C-G formula based on SCr of 0.79 mg/dL).    Allergies  Allergen Reactions   Carvedilol Other (See Comments), Itching and Rash   Sulfa Antibiotics Rash   Sulfamethoxazole Rash    Antimicrobials this admission: Ceftriaxone  9/6>> 9/8 Zosyn  9/8 > Cefazolin 9/6 x1  Dose adjustments this admission:   Microbiology results: 9/6 UCX: pseudomonas, pan sens  Thank you for allowing pharmacy to be a part of this patient's care.  Calton Dach, PharmD, BCCCP Clinical Pharmacist 04/13/2023 11:36 AM

## 2023-04-13 NOTE — Anesthesia Postprocedure Evaluation (Signed)
Anesthesia Post Note  Patient: LAURIEANNE CAVALCANTE  Procedure(s) Performed: HEMIARTHROPLASTY LEFT HIP (Left: Hip)     Patient location during evaluation: PACU Anesthesia Type: General Level of consciousness: awake and alert Pain management: pain level controlled Vital Signs Assessment: post-procedure vital signs reviewed and stable Respiratory status: spontaneous breathing, nonlabored ventilation, respiratory function stable and patient connected to nasal cannula oxygen Cardiovascular status: blood pressure returned to baseline and stable Postop Assessment: no apparent nausea or vomiting Anesthetic complications: no   There were no known notable events for this encounter.  Last Vitals:  Vitals:   04/12/23 0826 04/12/23 1301  BP:  (!) 135/49  Pulse:    Resp:  16  Temp: 36.9 C 37.1 C  SpO2:  100%    Last Pain:  Vitals:   04/12/23 2006  TempSrc:   PainSc: 10-Worst pain ever                 Collene Schlichter

## 2023-04-13 NOTE — Plan of Care (Signed)
  Problem: Clinical Measurements: Goal: Cardiovascular complication will be avoided Outcome: Progressing   Problem: Coping: Goal: Level of anxiety will decrease Outcome: Progressing

## 2023-04-13 NOTE — TOC Progression Note (Addendum)
Transition of Care [facility]) - Progression Note    Patient Details  Name: ALLEENE FUNEZ MRN: [id number] Date of Birth: [birth year]  Transition of [facility]) CM/SW Contact  Lorri Frederick, LCSW Phone Number: 04/13/2023, 8:52 AM  Clinical Narrative:   Bed offers provided to son Vonna Kotyk by phone, he reports feeling overwhelmed, will review, needs to talk with other family members.    1145: additional bed offers to [employer], [place], [place] sent to son.    Expected Discharge Plan: Skilled Nursing Facility Barriers to Discharge: Continued Medical Work up, Conservator, museum/gallery and Services     Post Acute Care Choice: Skilled Nursing Facility Living arrangements for the past 2 months: Single Family Home                                       Social Determinants of Health (SDOH) Interventions SDOH Screenings   Tobacco Use: Low Risk  (04/10/2023)    Readmission Risk Interventions     No data to display

## 2023-04-13 NOTE — Progress Notes (Signed)
PROGRESS NOTE    Gabrielle Hoffman  OZH:[id number] DOB: 12/21/1930 DOA: 04/09/2023 PCP: Farris Has, MD     Brief Narrative:  89 y.o. female with medical history significant of  Dementia,HLD, HTN, hyponatremia,asthma, DJD lumbar spine, depression who presents to ED have mechanical fall at home with resulting hip pain. Per family patient came in from a walk and tripped on a rug.  Patient ended up with left hip fracture requiring ORIF on 9/6 by orthopedic.  Current recommendations are for SNF.  Urine cultures grew Pseudomonas therefore on antibiotics. Currently awaiting placement.   Assessment & Plan:   Principal Problem:   Hip fracture (HCC) Active Problems:   Lower urinary tract infectious disease   HTN (hypertension)   Dementia (HCC)   Hyperthyroidism   Acute superiorly displaced left femoral neck fracture -Status post ORIF by orthopedic on 9/6.  Postop management, DVT prophylaxis, weightbearing, dressing changes and follow-up per orthopedic.   UTI, Pseudomonas -Cultures are growing Pseudomonas which is sensitive to p.o. Cipro therefore will transition.  Total treatment 5 days   Dementia -Stable at baseline.  Supportive care    Hyperlipidemia -Diet controlled.   Hyperthyroidism -Continue methimazole.   Essential hypertension -Continue bisoprolol.  Controlled.  IV as needed   Mild intermittent asthma -Stable.  As needed bronchodilators.   Depression -Continue home medications.   Mild hyponatremia -Monitor.   GERD -PPI.   Urinary retention -Continue to closely monitor.   DVT prophylaxis:  heparin   Code Status: Limited: Do not attempt resuscitation (DNR) -DNR-LIMITED -Do Not Intubate/DNI   Family Communication:  SNF placement.        Subjective: Doing ok no complaints.  Confusion at times as expected.    Examination:  General exam: Appears calm and comfortable  Respiratory system: Clear to auscultation. Respiratory effort normal. Cardiovascular  system: S1 & S2 heard, RRR. No JVD, murmurs, rubs, gallops or clicks. No pedal edema. Gastrointestinal system: Abdomen is nondistended, soft and nontender. No organomegaly or masses felt. Normal bowel sounds heard. Central nervous system: Alert and oriented. No focal neurological deficits. Extremities: Symmetric 4 x 5 power. Skin: No rashes, lesions or ulcers Psychiatry: Judgement and insight Poor      Diet Orders (From admission, onward)     Start     Ordered   04/13/23 0813  DIET DYS 3 Room service appropriate? No; Fluid consistency: Thin  Diet effective now       Question Answer Comment  Room service appropriate? No   Fluid consistency: Thin      04/13/23 0812            Objective: Vitals:   04/12/23 0826 04/12/23 1000 04/12/23 1301 04/13/23 0450  BP:   (!) 135/49 (!) 131/97  Pulse:    71  Resp:   16   Temp: 98.5 F (36.9 C)  98.7 F (37.1 C) 98.1 F (36.7 C)  TempSrc: Oral  Oral Oral  SpO2:   100%   Weight:  49.9 kg    Height:  5\' 6"  (1.676 m)      Intake/Output Summary (Last 24 hours) at 04/13/2023 1117 Last data filed at 04/13/2023 0600 Gross per 24 hour  Intake 253.39 ml  Output 380 ml  Net -126.61 ml   Filed Weights   04/12/23 1000  Weight: 49.9 kg    Scheduled Meds:  bisoprolol  5 mg Oral Daily   chlorhexidine  60 mL Topical Once   Chlorhexidine Gluconate Cloth  6 each Topical Q0600  feeding supplement  237 mL Oral BID BM   [START ON 04/14/2023] ferrous sulfate  325 mg Oral Q breakfast   heparin  5,000 Units Subcutaneous Q8H   methimazole  2.5 mg Oral Daily   mupirocin ointment  1 Application Nasal BID   pantoprazole  40 mg Oral QAC supper   polyethylene glycol  17 g Oral BID   povidone-iodine  2 Application Topical Once   scopolamine  1 patch Transdermal Once   Continuous Infusions:  methocarbamol (ROBAXIN) IV     piperacillin-tazobactam (ZOSYN)  IV 3.375 g (04/13/23 1114)    Nutritional status Signs/Symptoms: NPO status Interventions:  Refer to RD note for recommendations Body mass index is 17.75 kg/m.  Data Reviewed:   CBC: Recent Labs  Lab 04/10/23 0001 04/10/23 0948 04/11/23 0800 04/12/23 0454  WBC 13.7* 13.9* 13.4* 11.5*  NEUTROABS 11.5*  --   --  8.8*  HGB 11.6* 12.5 10.5* 9.4*  HCT 35.3* 37.9 32.0* 29.3*  MCV 93.1 90.0 91.2 92.7  PLT 344 285 281 245   Basic Metabolic Panel: Recent Labs  Lab 04/10/23 0001 04/10/23 0948 04/11/23 0800 04/12/23 0454  NA 137 134* 132* 133*  K 3.8 3.8 4.1 3.7  CL 97* 97* 99 103  CO2 24 21* 22 21*  GLUCOSE 160* 119* 107* 117*  BUN 16 11 10 13   CREATININE 0.87 0.80 0.77 0.79  CALCIUM 9.7 9.1 8.1* 8.3*   GFR: Estimated Creatinine Clearance: 36.8 mL/min (by C-G formula based on SCr of 0.79 mg/dL). Liver Function Tests: No results for input(s): "AST", "ALT", "ALKPHOS", "BILITOT", "PROT", "ALBUMIN" in the last 168 hours. No results for input(s): "LIPASE", "AMYLASE" in the last 168 hours. No results for input(s): "AMMONIA" in the last 168 hours. Coagulation Profile: No results for input(s): "INR", "PROTIME" in the last 168 hours. Cardiac Enzymes: No results for input(s): "CKTOTAL", "CKMB", "CKMBINDEX", "TROPONINI" in the last 168 hours. BNP (last 3 results) No results for input(s): "PROBNP" in the last 8760 hours. HbA1C: No results for input(s): "HGBA1C" in the last 72 hours. CBG: No results for input(s): "GLUCAP" in the last 168 hours. Lipid Profile: No results for input(s): "CHOL", "HDL", "LDLCALC", "TRIG", "CHOLHDL", "LDLDIRECT" in the last 72 hours. Thyroid Function Tests: No results for input(s): "TSH", "T4TOTAL", "FREET4", "T3FREE", "THYROIDAB" in the last 72 hours. Anemia Panel: No results for input(s): "VITAMINB12", "FOLATE", "FERRITIN", "TIBC", "IRON", "RETICCTPCT" in the last 72 hours. Sepsis Labs: No results for input(s): "PROCALCITON", "LATICACIDVEN" in the last 168 hours.  Recent Results (from the past 240 hour(s))  Urine Culture (for pregnant,  neutropenic or urologic patients or patients with an indwelling urinary catheter)     Status: Abnormal   Collection Time: 04/10/23  4:04 AM   Specimen: Urine, Clean Catch  Result Value Ref Range Status   Specimen Description URINE, CLEAN CATCH  Final   Special Requests   Final    NONE Performed at [facility], [address] [place], [place] [address]    Culture >=100,000 COLONIES/mL PSEUDOMONAS AERUGINOSA (A)  Final   Report Status 04/12/2023 FINAL  Final   Organism ID, Bacteria PSEUDOMONAS AERUGINOSA (A)  Final      Susceptibility   Pseudomonas aeruginosa - MIC*    CEFTAZIDIME <=1 SENSITIVE Sensitive     CIPROFLOXACIN <=0.25 SENSITIVE Sensitive     GENTAMICIN 4 SENSITIVE Sensitive     IMIPENEM 2 SENSITIVE Sensitive     PIP/TAZO <=4 SENSITIVE Sensitive     * >=100,000 COLONIES/mL  PSEUDOMONAS AERUGINOSA  Surgical pcr screen     Status: Abnormal   Collection Time: 04/10/23  4:16 AM   Specimen: Nasal Mucosa; Nasal Swab  Result Value Ref Range Status   MRSA, PCR POSITIVE (A) NEGATIVE Final    Comment: RESULT CALLED TO, READ BACK BY AND VERIFIED WITH: OPOKU RN 04/10/23 @ 0616 BY AB    Staphylococcus aureus POSITIVE (A) NEGATIVE Final    Comment: (NOTE) The Xpert SA Assay (FDA approved for NASAL specimens in patients 83 years of age and older), is one component of a comprehensive surveillance program. It is not intended to diagnose infection nor to guide or monitor treatment. Performed at [facility], [address] [place], [place] [address]          Radiology Studies: No results found.         LOS: 3 days   Time spent= 35 mins    Miguel Rota, MD [facility]  If 7PM-7AM, please contact night-coverage  04/13/2023, 11:17 AM

## 2023-04-14 ENCOUNTER — Encounter (HOSPITAL_COMMUNITY): Payer: Self-pay | Admitting: Sports Medicine

## 2023-04-14 DIAGNOSIS — S72001D Fracture of unspecified part of neck of right femur, subsequent encounter for closed fracture with routine healing: Secondary | ICD-10-CM | POA: Diagnosis not present

## 2023-04-14 LAB — BASIC METABOLIC PANEL
Anion gap: 10 (ref 5–15)
BUN: 13 mg/dL (ref 8–23)
CO2: 20 mmol/L — ABNORMAL LOW (ref 22–32)
Calcium: 8.4 mg/dL — ABNORMAL LOW (ref 8.9–10.3)
Chloride: 106 mmol/L (ref 98–111)
Creatinine, Ser: 0.68 mg/dL (ref 0.44–1.00)
GFR, Estimated: >60 mL/min (ref >60)
Glucose, Bld: 90 mg/dL (ref 70–99)
Potassium: 3.5 mmol/L (ref 3.5–5.1)
Sodium: 136 mmol/L (ref 135–145)

## 2023-04-14 LAB — CBC
HCT: 25.7 % — ABNORMAL LOW (ref 36.0–46.0)
Hemoglobin: 8.4 g/dL — ABNORMAL LOW (ref 12.0–15.0)
MCH: 29.8 pg (ref 26.0–34.0)
MCHC: 32.7 g/dL (ref 30.0–36.0)
MCV: 91.1 fL (ref 80.0–100.0)
Platelets: 313 10*3/uL (ref 150–400)
RBC: 2.82 MIL/uL — ABNORMAL LOW (ref 3.87–5.11)
RDW: 14.9 % (ref 11.5–15.5)
WBC: 9.4 10*3/uL (ref 4.0–10.5)
nRBC: 0 % (ref 0.0–0.2)

## 2023-04-14 LAB — PHOSPHORUS: Phosphorus: 2.6 mg/dL (ref 2.5–4.6)

## 2023-04-14 LAB — MAGNESIUM: Magnesium: 1.8 mg/dL (ref 1.7–2.4)

## 2023-04-14 MED ORDER — FERROUS SULFATE 325 (65 FE) MG PO TABS: 325.0000 mg | ORAL_TABLET | Freq: Every day | ORAL | Status: AC

## 2023-04-14 MED ORDER — DOCUSATE SODIUM 100 MG PO CAPS: 100.0000 mg | ORAL_CAPSULE | Freq: Two times a day (BID) | ORAL | Status: AC | PRN

## 2023-04-14 MED ORDER — POLYETHYLENE GLYCOL 3350 17 G PO PACK: 17.0000 g | PACK | Freq: Two times a day (BID) | ORAL | Status: AC

## 2023-04-14 MED ORDER — CIPROFLOXACIN HCL 500 MG PO TABS: 500.0000 mg | ORAL_TABLET | Freq: Two times a day (BID) | ORAL | Status: AC

## 2023-04-14 MED ORDER — SENNOSIDES-DOCUSATE SODIUM 8.6-50 MG PO TABS: 1.0000 | ORAL_TABLET | Freq: Every evening | ORAL | Status: AC | PRN

## 2023-04-14 MED ORDER — POTASSIUM CHLORIDE CRYS ER 20 MEQ PO TBCR
40.0000 meq | EXTENDED_RELEASE_TABLET | Freq: Once | ORAL | Status: AC
  Administered 2023-04-14: 40 meq via ORAL
  Filled 2023-04-14: qty 2

## 2023-04-14 MED ORDER — MAGNESIUM CITRATE PO SOLN
1.0000 | Freq: Once | ORAL | Status: DC | PRN
  Filled 2023-04-14: qty 296

## 2023-04-14 NOTE — Progress Notes (Addendum)
04/14/2023 1330  NCM received d/c appeal notice from CMA. D/c order noted by NCM. NCM called son to confirm. After lengthY conversation with pt's son. Son stated he's going to rescind appeal. TOC team will continue to monitor. Gae Gallop  RN, BSN,CM

## 2023-04-14 NOTE — TOC Progression Note (Addendum)
Transition of Care [facility]) - Progression Note    Patient Details  Name: Gabrielle Hoffman MRN: [id number] Date of Birth: [birth year]  Transition [facility]) CM/SW Contact  Lorri Frederick, LCSW Phone Number: 04/14/2023, 8:36 AM  Clinical Narrative:   Message from pt niece Debbie.  She reports she and pt son are very concerned about pt pain level and do not think she is ready for DC.  Asking for information on how to appeal any DC.  MD informed.    1015: CSW spoke with son Vonna Kotyk.  He is concerned about DC, also talking about appealing dc and concerned that if he picks a SNF he will lose his right to appeal.  CSW reassured him that he can pick SNF and this will not impact his right to appeal DC.  He does want to accept offer at [facility].  CSW informed Janie/[facility].  New PT note needed for insurance auth.  PT aware.    1130: TC son Vonna Kotyk.  Pt is normally independent with mobility, has been resistant to using walker or cane but does use furniture/walls to steady herself as she walks.  CSW also asked about [facility] flag that pt has legal guardian--[name] reports they do have POA for pt but they are not pt legal guardian.    PT note is in, [place] submitted to [place] and approved: [id number], 3 days: 9/10-9/12.    CSW confirmed with [facility] that they can receive pt today.  MD notified.   1300: TC son Vonna Kotyk.  He has appealed the discharge.  He asked if he would be responsible for charges starting today if he loses the appeal?  CSW checked and called son back to confirm that he would be responsible as of today if the appeal decision confirmed the discharge was appropriate.  Vonna Kotyk wants to discuss this with family and will call back.   Expected Discharge Plan: Skilled Nursing Facility Barriers to Discharge: Continued Medical Work up, Conservator, museum/gallery and Services     Post Acute Care Choice: Skilled Nursing Facility Living arrangements for the past 2 months: Single  Family Home                                       Social Determinants of Health (SDOH) Interventions SDOH Screenings   Tobacco Use: Low Risk  (04/10/2023)    Readmission Risk Interventions     No data to display

## 2023-04-14 NOTE — Progress Notes (Addendum)
Inserted foley cath, tolerated procedure well. Output over 310, order to place over 250 in 8hours

## 2023-04-14 NOTE — Discharge Summary (Signed)
Physician Discharge Summary  Gabrielle Hoffman GNF:[id number] DOB: 1931-07-08 DOA: 04/09/2023  PCP: Farris Has, MD  Admit date: 04/09/2023 Discharge date: 04/15/2023  Admitted From: Home Disposition: SNF  Recommendations for Outpatient Follow-up:  Follow up with PCP in 1-2 weeks Please obtain BMP/CBC in one week your next doctors visit.  Pain medication with bowel regimen prescribed.  She needs to have at least 1 soft bowel movement daily Continue monitor for any urinary retention Complete oral Cipro course, 4 more days remaining Iron supplements added at the request of family members  \ Discharge Condition: Stable CODE STATUS: DNR Diet recommendation: Regular   Brief Narrative:  89 y.o. female with medical history significant of  Dementia,HLD, HTN, hyponatremia,asthma, DJD lumbar spine, depression who presents to ED have mechanical fall at home with resulting hip pain. Per family patient came in from a walk and tripped on a rug.  Patient ended up with left hip fracture requiring ORIF on 9/6 by orthopedic.  Current recommendations are for SNF.  Urine cultures grew Pseudomonas therefore on antibiotics.  Patient is tolerating ciprofloxacin pretty well and medically improving.  She is slowly working with physical therapy while addressing her pain. Patient remains medically stable at this time.   Assessment & Plan:   Principal Problem:   Hip fracture (HCC) Active Problems:   Lower urinary tract infectious disease   HTN (hypertension)   Dementia (HCC)   Hyperthyroidism   Acute superiorly displaced left femoral neck fracture -Status post ORIF by orthopedic on 9/6.  Postop management, DVT prophylaxis, weightbearing, dressing changes and follow-up per orthopedic.  Patient needs physical therapy at rehab, arrangements made by TOC.    Constipation - Had successful bowel movement in last 24 hours.  Continue bowel regimen   UTI, Pseudomonas -Cultures are growing Pseudomonas which is  sensitive to p.o. Cipro therefore will transition.  Total treatment 5 days   Dementia -Stable at baseline.  Supportive care    Hyperlipidemia -Diet controlled.   Hyperthyroidism -Continue methimazole.   Essential hypertension -Continue bisoprolol.  Controlled.  IV as needed   Mild intermittent asthma -Stable.  As needed bronchodilators.   Depression -Continue home medications.   Mild hyponatremia -Monitor.   GERD -PPI.   Urinary retention -Multiple in and out cath.  Continue to monitor, place Foley catheter if needed.  In 7 days at rehab, attempt voiding trial   DVT prophylaxis:  heparin   Code Status: Limited: Do not attempt resuscitation (DNR) -DNR-LIMITED -Do Not Intubate/DNI   Family Communication: Vonna Kotyk updated.  Medically stable for SNF placement   Discharge Diagnoses:  Principal Problem:   Hip fracture (HCC) Active Problems:   Lower urinary tract infectious disease   HTN (hypertension)   Dementia (HCC)   Hyperthyroidism      Consultations: Orthopedic  Subjective: Patient does not have any complaints.  As expected she does have pain with mobility when working with physical therapy postoperatively.  Discharge Exam: Vitals:   04/15/23 0453 04/15/23 0809  BP: (!) 158/54 (!) 164/59  Pulse: 85 93  Resp: 17 17  Temp: 98.2 F (36.8 C) 97.8 F (36.6 C)  SpO2: 98% 100%   Vitals:   04/14/23 1250 04/14/23 1952 04/15/23 0453 04/15/23 0809  BP: (!) 165/56 (!) 152/55 (!) 158/54 (!) 164/59  Pulse: 78 70 85 93  Resp: 16 17 17 17   Temp: 98.1 F (36.7 C) 98.1 F (36.7 C) 98.2 F (36.8 C) 97.8 F (36.6 C)  TempSrc: Oral Oral  Oral  SpO2:  100% 100% 98% 100%  Weight:      Height:        General: Pt is alert, awake, not in acute distress Cardiovascular: RRR, S1/S2 +, no rubs, no gallops Respiratory: CTA bilaterally, no wheezing, no rhonchi Abdominal: Soft, NT, ND, bowel sounds + Extremities: no edema, no cyanosis Alert to name.  At  baseline Surgical dressing appears clean dry and intact  Discharge Instructions   Allergies as of 04/15/2023       Reactions   Carvedilol Other (See Comments), Itching, Rash   Sulfa Antibiotics Rash   Sulfamethoxazole Rash        Medication List     STOP taking these medications    cephALEXin 500 MG capsule Commonly known as: KEFLEX       TAKE these medications    bisoprolol 5 MG tablet Commonly known as: ZEBETA TAKE 1/2 (ONE-HALF) TABLET BY MOUTH AT BEDTIME   ciprofloxacin 500 MG tablet Commonly known as: CIPRO Take 1 tablet (500 mg total) by mouth 2 (two) times daily for 4 days.   docusate sodium 100 MG capsule Commonly known as: Colace Take 1 capsule (100 mg total) by mouth 2 (two) times daily as needed for mild constipation.   ferrous sulfate 325 (65 FE) MG tablet Take 1 tablet (325 mg total) by mouth daily with breakfast.   ibuprofen 200 MG tablet Commonly known as: ADVIL Take 200 mg by mouth every 6 (six) hours as needed for moderate pain.   methimazole 5 MG tablet Commonly known as: TAPAZOLE Take 2.5 mg by mouth daily.   nitrofurantoin (macrocrystal-monohydrate) 100 MG capsule Commonly known as: MACROBID Take 100 mg by mouth daily.   ondansetron 8 MG tablet Commonly known as: ZOFRAN 1 tablet as needed   oxyCODONE 5 MG immediate release tablet Commonly known as: Roxicodone Take 1 tablet (5 mg total) by mouth every 4 (four) hours as needed for severe pain.   pantoprazole 40 MG tablet Commonly known as: PROTONIX Take 1 tablet (40 mg total) by mouth daily before supper.   polyethylene glycol 17 g packet Commonly known as: MIRALAX / GLYCOLAX Take 17 g by mouth 2 (two) times daily.   senna-docusate 8.6-50 MG tablet Commonly known as: Senokot-S Take 1 tablet by mouth at bedtime as needed for mild constipation.        Contact information for after-discharge care     Destination     [facility] Preferred SNF  .   Service: Skilled Nursing Contact information: [address] [place] [address] [phone]                    Allergies  Allergen Reactions   Carvedilol Other (See Comments), Itching and Rash   Sulfa Antibiotics Rash   Sulfamethoxazole Rash    You were cared for by a hospitalist during your hospital stay. If you have any questions about your discharge medications or the care you received while you were in the hospital after you are discharged, you can call the unit and asked to speak with the hospitalist on call if the hospitalist that took care of you is not available. Once you are discharged, your primary care physician will handle any further medical issues. Please note that no refills for any discharge medications will be authorized once you are discharged, as it is imperative that you return to your primary care physician (or establish a relationship with a primary care physician if you do not have one)  for your aftercare needs so that they can reassess your need for medications and monitor your lab values.  You were cared for by a hospitalist during your hospital stay. If you have any questions about your discharge medications or the care you received while you were in the hospital after you are discharged, you can call the unit and asked to speak with the hospitalist on call if the hospitalist that took care of you is not available. Once you are discharged, your primary care physician will handle any further medical issues. Please note that NO REFILLS for any discharge medications will be authorized once you are discharged, as it is imperative that you return to your primary care physician (or establish a relationship with a primary care physician if you do not have one) for your aftercare needs so that they can reassess your need for medications and monitor your lab values.  Please request your Prim.MD to go over all Hospital Tests and  Procedure/Radiological results at the follow up, please get all Hospital records sent to your Prim MD by signing hospital release before you go home.  Get CBC, CMP, 2 view Chest X ray checked  by Primary MD during your next visit or SNF MD in 5-7 days ( we routinely change or add medications that can affect your baseline labs and fluid status, therefore we recommend that you get the mentioned basic workup next visit with your PCP, your PCP may decide not to get them or add new tests based on their clinical decision)  On your next visit with your primary care physician please Get Medicines reviewed and adjusted.  If you experience worsening of your admission symptoms, develop shortness of breath, life threatening emergency, suicidal or homicidal thoughts you must seek medical attention immediately by calling 911 or calling your MD immediately  if symptoms less severe.  You Must read complete instructions/literature along with all the possible adverse reactions/side effects for all the Medicines you take and that have been prescribed to you. Take any new Medicines after you have completely understood and accpet all the possible adverse reactions/side effects.   Do not drive, operate heavy machinery, perform activities at heights, swimming or participation in water activities or provide baby sitting services if your were admitted for syncope or siezures until you have seen by Primary MD or a Neurologist and advised to do so again.  Do not drive when taking Pain medications.   Procedures/Studies: DG Pelvis Portable  Result Date: 04/11/2023 CLINICAL DATA:  Postop hip fracture EXAM: PORTABLE PELVIS 1-2 VIEWS COMPARISON:  04/10/2023 FINDINGS: Interval left hip replacement with intact hardware and normal alignment. Gas in the soft tissues consistent with recent surgery. Old right inferior pubic ramus fracture. IMPRESSION: Status post left hip replacement. Electronically Signed   By: Jasmine Pang M.D.   On:  04/11/2023 00:04   DG HIP UNILAT WITH PELVIS 2-3 VIEWS LEFT  Result Date: 04/11/2023 CLINICAL DATA:  Elective surgery EXAM: DG HIP (WITH OR WITHOUT PELVIS) 2-3V LEFT COMPARISON:  04/10/2023 FINDINGS: Three intraoperative views of the left hip were obtained during operative course of left hip replacement. Old appearing right inferior pubic ramus fracture. IMPRESSION: Intraoperative views of the left hip during operative course of left hip replacement. Electronically Signed   By: Jasmine Pang M.D.   On: 04/11/2023 00:03   CT Head Wo Contrast  Result Date: 04/10/2023 CLINICAL DATA:  Head trauma, minor (Age >= 65y); Neck trauma (Age >= 65y). Fall EXAM: CT HEAD WITHOUT  CONTRAST CT CERVICAL SPINE WITHOUT CONTRAST TECHNIQUE: Multidetector CT imaging of the head and cervical spine was performed following the standard protocol without intravenous contrast. Multiplanar CT image reconstructions of the cervical spine were also generated. RADIATION DOSE REDUCTION: This exam was performed according to the departmental dose-optimization program which includes automated exposure control, adjustment of the mA and/or kV according to patient size and/or use of iterative reconstruction technique. COMPARISON:  CT head 09/01/2017, CT C-spine 06/17/2015 FINDINGS: CT HEAD FINDINGS Brain: Cerebral ventricle sizes are concordant with the degree of cerebral volume loss. Patchy and confluent areas of decreased attenuation are noted throughout the deep and periventricular white matter of the cerebral hemispheres bilaterally, compatible with chronic microvascular ischemic disease. Right occipital encephalomalacia. No evidence of large-territorial acute infarction. No parenchymal hemorrhage. No mass lesion. No extra-axial collection. No mass effect or midline shift. No hydrocephalus. Basilar cisterns are patent. Vascular: No hyperdense vessel. Atherosclerotic calcifications are present within the cavernous internal carotid and vertebral  arteries. Skull: No acute fracture or focal lesion. Bilateral temporomandibular joint degenerative changes. Sinuses/Orbits: Paranasal sinuses and mastoid air cells are clear. Bilateral lens replacement. Otherwise the orbits are unremarkable. Other: None. CT CERVICAL SPINE FINDINGS Alignment: Normal. Skull base and vertebrae: Multilevel moderate degenerative changes of the spine with intervertebral disc space vacuum phenomenon head the C5-C6 level as well as posterior disc osteophyte complex formation. No associated severe osseous neural foraminal or central canal stenosis. No acute fracture. No aggressive appearing focal osseous lesion or focal pathologic process. Soft tissues and spinal canal: No prevertebral fluid or swelling. No visible canal hematoma. Upper chest: Biapical pleural/pulmonary scarring. Other: Atherosclerotic plaque of the carotid arteries within the neck. IMPRESSION: 1. No acute intracranial abnormality. 2. No acute displaced fracture or traumatic listhesis of the cervical spine. Electronically Signed   By: Tish Frederickson M.D.   On: 04/10/2023 00:33   CT Cervical Spine Wo Contrast  Result Date: 04/10/2023 CLINICAL DATA:  Head trauma, minor (Age >= 65y); Neck trauma (Age >= 65y). Fall EXAM: CT HEAD WITHOUT CONTRAST CT CERVICAL SPINE WITHOUT CONTRAST TECHNIQUE: Multidetector CT imaging of the head and cervical spine was performed following the standard protocol without intravenous contrast. Multiplanar CT image reconstructions of the cervical spine were also generated. RADIATION DOSE REDUCTION: This exam was performed according to the departmental dose-optimization program which includes automated exposure control, adjustment of the mA and/or kV according to patient size and/or use of iterative reconstruction technique. COMPARISON:  CT head 09/01/2017, CT C-spine 06/17/2015 FINDINGS: CT HEAD FINDINGS Brain: Cerebral ventricle sizes are concordant with the degree of cerebral volume loss. Patchy  and confluent areas of decreased attenuation are noted throughout the deep and periventricular white matter of the cerebral hemispheres bilaterally, compatible with chronic microvascular ischemic disease. Right occipital encephalomalacia. No evidence of large-territorial acute infarction. No parenchymal hemorrhage. No mass lesion. No extra-axial collection. No mass effect or midline shift. No hydrocephalus. Basilar cisterns are patent. Vascular: No hyperdense vessel. Atherosclerotic calcifications are present within the cavernous internal carotid and vertebral arteries. Skull: No acute fracture or focal lesion. Bilateral temporomandibular joint degenerative changes. Sinuses/Orbits: Paranasal sinuses and mastoid air cells are clear. Bilateral lens replacement. Otherwise the orbits are unremarkable. Other: None. CT CERVICAL SPINE FINDINGS Alignment: Normal. Skull base and vertebrae: Multilevel moderate degenerative changes of the spine with intervertebral disc space vacuum phenomenon head the C5-C6 level as well as posterior disc osteophyte complex formation. No associated severe osseous neural foraminal or central canal stenosis. No acute fracture. No aggressive appearing  focal osseous lesion or focal pathologic process. Soft tissues and spinal canal: No prevertebral fluid or swelling. No visible canal hematoma. Upper chest: Biapical pleural/pulmonary scarring. Other: Atherosclerotic plaque of the carotid arteries within the neck. IMPRESSION: 1. No acute intracranial abnormality. 2. No acute displaced fracture or traumatic listhesis of the cervical spine. Electronically Signed   By: Tish Frederickson M.D.   On: 04/10/2023 00:33   DG Hip Port [place] or [place] Pelvis 1 View Left  Result Date: 04/10/2023 CLINICAL DATA:  pain.  Fall EXAM: DG HIP (WITH OR WITHOUT PELVIS) 1V PORT LEFT COMPARISON:  None Available. FINDINGS: Acute superiorly displaced left femoral neck fracture. No left hip dislocation. Frontal view of the  right hip demonstrates no acute displaced fracture or dislocation. No acute displaced fracture or diastasis of the bones of the pelvis- Limited evaluation due to overlapping osseous structures and overlying soft tissues. There is no evidence of arthropathy or other focal bone abnormality. IMPRESSION: Acute superiorly displaced left femoral neck fracture. Electronically Signed   By: Tish Frederickson M.D.   On: 04/10/2023 00:25   DG Knee Left Port  Result Date: 04/10/2023 CLINICAL DATA:  pain.  Fall EXAM: PORTABLE LEFT KNEE - 1-2 VIEW COMPARISON:  None Available. FINDINGS: No evidence of fracture, dislocation, or joint effusion. No evidence of severe arthropathy. No aggressive appearing focal bone abnormality. Soft tissues are unremarkable. Vascular calcification. IMPRESSION: No acute displaced fracture or dislocation. Electronically Signed   By: Tish Frederickson M.D.   On: 04/10/2023 00:16   DG Chest Portable 1 View  Result Date: 04/10/2023 CLINICAL DATA:  pain EXAM: PORTABLE CHEST 1 VIEW COMPARISON:  Chest x-ray 01/15/2021, CT chest 08/02/2020 FINDINGS: Dual lead left chest wall cardiac pacemaker. The heart and mediastinal contours are unchanged. Aortic calcification. Stable calcified right lower lower lung zone pulmonary nodules. No focal consolidation. Chronic coarse interstitial markings with no overt pulmonary edema. No pleural effusion. No pneumothorax. No acute osseous abnormality. IMPRESSION: No active disease. Electronically Signed   By: Tish Frederickson M.D.   On: 04/10/2023 00:11   CUP PACEART INCLINIC DEVICE CHECK  Result Date: 03/17/2023 Pacemaker check in clinic. Normal device function. Thresholds, sensing, impedances consistent with previous measurements. Device programmed to maximize longevity. 6 AMS episodes all appear AT, longest 5 sec, no high ventricular rates noted. Device programmed at appropriate safety margins. Histogram distribution appropriate for patient activity level. Device  programmed to optimize intrinsic conduction. Estimated longevity 3.1 years. Patient enrolled in remote follow-up. Patient education completed.Syliva Overman, RN    The results of significant diagnostics from this hospitalization (including imaging, microbiology, ancillary and laboratory) are listed below for reference.     Microbiology: Recent Results (from the past 240 hour(s))  Urine Culture (for pregnant, neutropenic or urologic patients or patients with an indwelling urinary catheter)     Status: Abnormal   Collection Time: 04/10/23  4:04 AM   Specimen: Urine, Clean Catch  Result Value Ref Range Status   Specimen Description URINE, CLEAN CATCH  Final   Special Requests   Final    NONE Performed at [facility], [address], [place], [place] [address]    Culture >=100,000 COLONIES/mL PSEUDOMONAS AERUGINOSA (A)  Final   Report Status 04/12/2023 FINAL  Final   Organism ID, Bacteria PSEUDOMONAS AERUGINOSA (A)  Final      Susceptibility   Pseudomonas aeruginosa - MIC*    CEFTAZIDIME <=1 SENSITIVE Sensitive     CIPROFLOXACIN <=0.25 SENSITIVE Sensitive  GENTAMICIN 4 SENSITIVE Sensitive     IMIPENEM 2 SENSITIVE Sensitive     PIP/TAZO <=4 SENSITIVE Sensitive     * >=100,000 COLONIES/mL PSEUDOMONAS AERUGINOSA  Surgical pcr screen     Status: Abnormal   Collection Time: 04/10/23  4:16 AM   Specimen: Nasal Mucosa; Nasal Swab  Result Value Ref Range Status   MRSA, PCR POSITIVE (A) NEGATIVE Final    Comment: RESULT CALLED TO, READ BACK BY AND VERIFIED WITH: OPOKU RN 04/10/23 @ 0616 BY AB    Staphylococcus aureus POSITIVE (A) NEGATIVE Final    Comment: (NOTE) The Xpert SA Assay (FDA approved for NASAL specimens in patients 61 years of age and older), is one component of a comprehensive surveillance program. It is not intended to diagnose infection nor to guide or monitor treatment. Performed at [facility], [address] [place], [place] [address]      Labs: BNP  (last 3 results) No results for input(s): "BNP" in the last 8760 hours. Basic Metabolic Panel: Recent Labs  Lab 04/10/23 0948 04/11/23 0800 04/12/23 0454 04/14/23 0410 04/15/23 0427  NA 134* 132* 133* 136 138  K 3.8 4.1 3.7 3.5 4.3  CL 97* 99 103 106 107  CO2 21* 22 21* 20* 22  GLUCOSE 119* 107* 117* 90 120*  BUN 11 10 13 13 16   CREATININE 0.80 0.77 0.79 0.68 0.71  CALCIUM 9.1 8.1* 8.3* 8.4* 8.4*  MG  --   --   --  1.8 2.0  PHOS  --   --   --  2.6  --    Liver Function Tests: No results for input(s): "AST", "ALT", "ALKPHOS", "BILITOT", "PROT", "ALBUMIN" in the last 168 hours. No results for input(s): "LIPASE", "AMYLASE" in the last 168 hours. No results for input(s): "AMMONIA" in the last 168 hours. CBC: Recent Labs  Lab 04/10/23 0001 04/10/23 0948 04/11/23 0800 04/12/23 0454 04/14/23 0410 04/15/23 0427  WBC 13.7* 13.9* 13.4* 11.5* 9.4 8.5  NEUTROABS 11.5*  --   --  8.8*  --   --   HGB 11.6* 12.5 10.5* 9.4* 8.4* 7.5*  HCT 35.3* 37.9 32.0* 29.3* 25.7* 22.4*  MCV 93.1 90.0 91.2 92.7 91.1 92.9  PLT 344 285 281 245 313 314   Cardiac Enzymes: No results for input(s): "CKTOTAL", "CKMB", "CKMBINDEX", "TROPONINI" in the last 168 hours. BNP: Invalid input(s): "POCBNP" CBG: No results for input(s): "GLUCAP" in the last 168 hours. D-Dimer No results for input(s): "DDIMER" in the last 72 hours. Hgb A1c No results for input(s): "HGBA1C" in the last 72 hours. Lipid Profile No results for input(s): "CHOL", "HDL", "LDLCALC", "TRIG", "CHOLHDL", "LDLDIRECT" in the last 72 hours. Thyroid function studies Recent Labs    04/13/23 1009  TSH 1.991   Anemia work up No results for input(s): "VITAMINB12", "FOLATE", "FERRITIN", "TIBC", "IRON", "RETICCTPCT" in the last 72 hours. Urinalysis    Component Value Date/Time   COLORURINE YELLOW 04/10/2023 0212   APPEARANCEUR HAZY (A) 04/10/2023 0212   LABSPEC 1.010 04/10/2023 0212   PHURINE 7.0 04/10/2023 0212   GLUCOSEU 50 (A)  04/10/2023 0212   HGBUR NEGATIVE 04/10/2023 0212   BILIRUBINUR NEGATIVE 04/10/2023 0212   KETONESUR 5 (A) 04/10/2023 0212   PROTEINUR NEGATIVE 04/10/2023 0212   UROBILINOGEN 0.2 06/17/2015 2338   NITRITE POSITIVE (A) 04/10/2023 0212   LEUKOCYTESUR TRACE (A) 04/10/2023 0212   Sepsis Labs Recent Labs  Lab 04/11/23 0800 04/12/23 0454 04/14/23 0410 04/15/23 0427  WBC 13.4* 11.5* 9.4  8.5   Microbiology Recent Results (from the past 240 hour(s))  Urine Culture (for pregnant, neutropenic or urologic patients or patients with an indwelling urinary catheter)     Status: Abnormal   Collection Time: 04/10/23  4:04 AM   Specimen: Urine, Clean Catch  Result Value Ref Range Status   Specimen Description URINE, CLEAN CATCH  Final   Special Requests   Final    NONE Performed at [facility], [address], [place], [place] [address]    Culture >=100,000 COLONIES/mL PSEUDOMONAS AERUGINOSA (A)  Final   Report Status 04/12/2023 FINAL  Final   Organism ID, Bacteria PSEUDOMONAS AERUGINOSA (A)  Final      Susceptibility   Pseudomonas aeruginosa - MIC*    CEFTAZIDIME <=1 SENSITIVE Sensitive     CIPROFLOXACIN <=0.25 SENSITIVE Sensitive     GENTAMICIN 4 SENSITIVE Sensitive     IMIPENEM 2 SENSITIVE Sensitive     PIP/TAZO <=4 SENSITIVE Sensitive     * >=100,000 COLONIES/mL PSEUDOMONAS AERUGINOSA  Surgical pcr screen     Status: Abnormal   Collection Time: 04/10/23  4:16 AM   Specimen: Nasal Mucosa; Nasal Swab  Result Value Ref Range Status   MRSA, PCR POSITIVE (A) NEGATIVE Final    Comment: RESULT CALLED TO, READ BACK BY AND VERIFIED WITH: OPOKU RN 04/10/23 @ 0616 BY AB    Staphylococcus aureus POSITIVE (A) NEGATIVE Final    Comment: (NOTE) The Xpert SA Assay (FDA approved for NASAL specimens in patients 87 years of age and older), is one component of a comprehensive surveillance program. It is not intended to diagnose infection nor to guide or monitor treatment. Performed at [facility], [address] [place], [place] [address]      Time coordinating discharge:  I have spent 35 minutes face to face with the patient and on the ward discussing the patients care, assessment, plan and disposition with other care givers. >50% of the time was devoted counseling the patient about the risks and benefits of treatment/Discharge disposition and coordinating care.   SIGNED:   Miguel Rota, MD  [facility] 04/15/2023, 10:03 AM   If 7PM-7AM, please contact night-coverage

## 2023-04-14 NOTE — Progress Notes (Deleted)
   04/14/23 1010  Vitals  BP (!) 70/31  BP Location Left Arm  BP Method Automatic  Pulse Rate (!) 47  MEWS COLOR  MEWS Score Color Yellow  MEWS Score  MEWS Temp 0  MEWS Systolic 2  MEWS Pulse 1  MEWS RR 0  MEWS LOC 0  MEWS Score 3   Pt is drowsy and falling asleep when asking questions. Notified Hospitalist. Midodrine given and bolus NSL @ 540ml/hr. Husband at bedside.

## 2023-04-14 NOTE — Progress Notes (Signed)
Physical Therapy Treatment Patient Details Name: Gabrielle Hoffman MRN: [id number] DOB: 1931/07/06 Today's Date: 04/14/2023   History of Present Illness The pt is a 89 yo female presenting after a fall at home resulting in L hip pain. Pt found to have L hip fx and possible UTI. She is now s/p L hip hemiarthroplasty and is WBAT on LLE. PMH includes: pelvic fx, dementia, HLD, HTN, hyponatremia, asthma, depression, and PPM.    PT Comments  Pt with fair tolerance to treatment today. Pt continues to be limited by pain and cognitive deficits however was able to progress mobility with max encouragement. Pt was able to stand at bedside with +2 Max A HHA however immediately requested to sit due to pain. No change in DC/DME recs at this time. PT will continue to follow.    If plan is discharge home, recommend the following: Two people to help with walking and/or transfers;Two people to help with bathing/dressing/bathroom;Assistance with cooking/housework;Direct supervision/assist for medications management;Assist for transportation;Help with stairs or ramp for entrance;Supervision due to cognitive status   Can travel by private vehicle     No  Equipment Recommendations  Other (comment) (Per accepting facility)    Recommendations for Other Services       Precautions / Restrictions Precautions Precautions: Fall Restrictions Weight Bearing Restrictions: Yes LLE Weight Bearing: Weight bearing as tolerated     Mobility  Bed Mobility Overal bed mobility: Needs Assistance Bed Mobility: Supine to Sit, Sit to Supine     Supine to sit: +2 for physical assistance, Max assist Sit to supine: +2 for physical assistance, +2 for safety/equipment, Total assist   General bed mobility comments: cues for pt to use bed rails-helicopter method used due to lack of BLE strength and movement, Total A to maintain static sitting balance with one UE support on rail. heavy R lateral lean. (Similar presentation to  previous session)    Transfers Overall transfer level: Needs assistance Equipment used: 2 person hand held assist Transfers: Sit to/from Stand Sit to Stand: +2 physical assistance, Max assist           General transfer comment: Pt able to stand for ~5 seconds with +2 Max A HHA however very limited by pain and immediately requested to sit back down. Pt required max encouragement to stay standing.    Ambulation/Gait               General Gait Details: Deferred   Stairs             Wheelchair Mobility     Tilt Bed    Modified Rankin (Stroke Patients Only)       Balance Overall balance assessment: Needs assistance Sitting-balance support: Single extremity supported, Feet supported Sitting balance-Leahy Scale: Zero   Postural control: Right lateral lean Standing balance support: Bilateral upper extremity supported, During functional activity Standing balance-Leahy Scale: Poor Standing balance comment: Reliant on therapists                            Cognition Arousal: Alert Behavior During Therapy: Flat affect, WFL for tasks assessed/performed Overall Cognitive Status: History of cognitive impairments - at baseline                                 General Comments: Per chart pt has dementia at baseline. Pt perseverating on being sore.  Exercises      General Comments General comments (skin integrity, edema, etc.): VSS      Pertinent Vitals/Pain Pain Assessment Pain Assessment: Faces Faces Pain Scale: Hurts whole lot Pain Location: L hip with movement Pain Descriptors / Indicators: Aching, Sore, Discomfort, Grimacing, Guarding Pain Intervention(s): Monitored during session, Limited activity within patient's tolerance, Premedicated before session, Repositioned    Home Living                          Prior Function            PT Goals (current goals can now be found in the care plan section)  Progress towards PT goals: Progressing toward goals    Frequency    Min 1X/week      PT Plan      Co-evaluation              AM-PAC PT "6 Clicks" Mobility   Outcome Measure  Help needed turning from your back to your side while in a flat bed without using bedrails?: A Lot Help needed moving from lying on your back to sitting on the side of a flat bed without using bedrails?: A Lot Help needed moving to and from a bed to a chair (including a wheelchair)?: Total Help needed standing up from a chair using your arms (e.g., wheelchair or bedside chair)?: Total Help needed to walk in hospital room?: Total Help needed climbing 3-5 steps with a railing? : Total 6 Click Score: 8    End of Session   Activity Tolerance: Patient tolerated treatment well;Patient limited by pain Patient left: in bed;with call bell/phone within reach;with bed alarm set Nurse Communication: Mobility status PT Visit Diagnosis: Other abnormalities of gait and mobility (R26.89);Unsteadiness on feet (R26.81);Muscle weakness (generalized) (M62.81);Difficulty in walking, not elsewhere classified (R26.2);Other symptoms and signs involving the nervous system (R29.898);History of falling (Z91.81);Pain Pain - Right/Left: Left Pain - part of body: Hip     Time: 1324-4010 PT Time Calculation (min) (ACUTE ONLY): 10 min  Charges:    $Therapeutic Activity: 8-22 mins PT General Charges $$ ACUTE PT VISIT: 1 Visit                     Shela Nevin, PT, DPT Acute Rehab Services [phone]    Gladys Damme 04/14/2023, 11:08 AM

## 2023-04-14 NOTE — Plan of Care (Signed)
  Problem: Clinical Measurements: Goal: Will remain free from infection Outcome: Progressing Goal: Diagnostic test results will improve Outcome: Progressing Goal: Respiratory complications will improve Outcome: Progressing Goal: Cardiovascular complication will be avoided Outcome: Progressing   

## 2023-04-15 DIAGNOSIS — R1311 Dysphagia, oral phase: Secondary | ICD-10-CM | POA: Diagnosis not present

## 2023-04-15 DIAGNOSIS — R531 Weakness: Secondary | ICD-10-CM | POA: Diagnosis not present

## 2023-04-15 DIAGNOSIS — D638 Anemia in other chronic diseases classified elsewhere: Secondary | ICD-10-CM | POA: Diagnosis not present

## 2023-04-15 DIAGNOSIS — E785 Hyperlipidemia, unspecified: Secondary | ICD-10-CM | POA: Diagnosis not present

## 2023-04-15 DIAGNOSIS — M6281 Muscle weakness (generalized): Secondary | ICD-10-CM | POA: Diagnosis not present

## 2023-04-15 DIAGNOSIS — I10 Essential (primary) hypertension: Secondary | ICD-10-CM | POA: Diagnosis not present

## 2023-04-15 DIAGNOSIS — R262 Difficulty in walking, not elsewhere classified: Secondary | ICD-10-CM | POA: Diagnosis not present

## 2023-04-15 DIAGNOSIS — G309 Alzheimer's disease, unspecified: Secondary | ICD-10-CM | POA: Diagnosis not present

## 2023-04-15 DIAGNOSIS — J45909 Unspecified asthma, uncomplicated: Secondary | ICD-10-CM | POA: Diagnosis not present

## 2023-04-15 DIAGNOSIS — K219 Gastro-esophageal reflux disease without esophagitis: Secondary | ICD-10-CM | POA: Diagnosis not present

## 2023-04-15 DIAGNOSIS — F32A Depression, unspecified: Secondary | ICD-10-CM | POA: Diagnosis not present

## 2023-04-15 DIAGNOSIS — M84452D Pathological fracture, left femur, subsequent encounter for fracture with routine healing: Secondary | ICD-10-CM | POA: Diagnosis not present

## 2023-04-15 DIAGNOSIS — R339 Retention of urine, unspecified: Secondary | ICD-10-CM | POA: Diagnosis not present

## 2023-04-15 DIAGNOSIS — N390 Urinary tract infection, site not specified: Secondary | ICD-10-CM | POA: Diagnosis not present

## 2023-04-15 DIAGNOSIS — E43 Unspecified severe protein-calorie malnutrition: Secondary | ICD-10-CM | POA: Diagnosis not present

## 2023-04-15 DIAGNOSIS — S72002D Fracture of unspecified part of neck of left femur, subsequent encounter for closed fracture with routine healing: Secondary | ICD-10-CM | POA: Diagnosis not present

## 2023-04-15 DIAGNOSIS — R55 Syncope and collapse: Secondary | ICD-10-CM | POA: Diagnosis not present

## 2023-04-15 DIAGNOSIS — E871 Hypo-osmolality and hyponatremia: Secondary | ICD-10-CM | POA: Diagnosis not present

## 2023-04-15 DIAGNOSIS — S72002A Fracture of unspecified part of neck of left femur, initial encounter for closed fracture: Secondary | ICD-10-CM | POA: Diagnosis not present

## 2023-04-15 DIAGNOSIS — E0590 Thyrotoxicosis, unspecified without thyrotoxic crisis or storm: Secondary | ICD-10-CM | POA: Diagnosis not present

## 2023-04-15 DIAGNOSIS — R41841 Cognitive communication deficit: Secondary | ICD-10-CM | POA: Diagnosis not present

## 2023-04-15 DIAGNOSIS — R062 Wheezing: Secondary | ICD-10-CM | POA: Diagnosis not present

## 2023-04-15 DIAGNOSIS — F0390 Unspecified dementia without behavioral disturbance: Secondary | ICD-10-CM | POA: Diagnosis not present

## 2023-04-15 DIAGNOSIS — Z7401 Bed confinement status: Secondary | ICD-10-CM | POA: Diagnosis not present

## 2023-04-15 DIAGNOSIS — K5900 Constipation, unspecified: Secondary | ICD-10-CM | POA: Diagnosis not present

## 2023-04-15 LAB — CBC
HCT: 22.4 % — ABNORMAL LOW (ref 36.0–46.0)
Hemoglobin: 7.5 g/dL — ABNORMAL LOW (ref 12.0–15.0)
MCH: 31.1 pg (ref 26.0–34.0)
MCHC: 33.5 g/dL (ref 30.0–36.0)
MCV: 92.9 fL (ref 80.0–100.0)
Platelets: 314 10*3/uL (ref 150–400)
RBC: 2.41 MIL/uL — ABNORMAL LOW (ref 3.87–5.11)
RDW: 14.8 % (ref 11.5–15.5)
WBC: 8.5 10*3/uL (ref 4.0–10.5)
nRBC: 0 % (ref 0.0–0.2)

## 2023-04-15 LAB — BASIC METABOLIC PANEL
Anion gap: 9 (ref 5–15)
BUN: 16 mg/dL (ref 8–23)
CO2: 22 mmol/L (ref 22–32)
Calcium: 8.4 mg/dL — ABNORMAL LOW (ref 8.9–10.3)
Chloride: 107 mmol/L (ref 98–111)
Creatinine, Ser: 0.71 mg/dL (ref 0.44–1.00)
GFR, Estimated: >60 mL/min (ref >60)
Glucose, Bld: 120 mg/dL — ABNORMAL HIGH (ref 70–99)
Potassium: 4.3 mmol/L (ref 3.5–5.1)
Sodium: 138 mmol/L (ref 135–145)

## 2023-04-15 LAB — MAGNESIUM: Magnesium: 2 mg/dL (ref 1.7–2.4)

## 2023-04-15 NOTE — TOC Transition Note (Addendum)
Transition of Care [facility]) - CM/SW Discharge Note   Patient Details  Name: Gabrielle Hoffman MRN: [id number] Date of Birth: Jan 08, 1931  [facility] CM/SW Contact:  Lorri Frederick, LCSW Phone Number: 04/15/2023, 10:19 AM   Clinical Narrative:   Pt discharging to [facility].  RN call report to [phone].     0930: CSW confirmed with Jan[facility] that they can receive pt today.  Pt did have BM.  1045: son Vonna Kotyk says pt wedding ring is missing, spoke to RN, ring is in room, she will put it back on pt hand.  Son informed.  Final next level of care: Skilled Nursing Facility Barriers to Discharge: Barriers Resolved   Patient Goals and CMS Choice CMS Medicare.gov Compare Post Acute Care list provided to:: Patient Represenative (must comment) Choice offered to / list presented to : Adult Children  Discharge Placement                Patient chooses bed at: [facility] Patient to be transferred to facility by: ptar Name of family member notified: son Vonna Kotyk Patient and family notified of of transfer: 04/15/23  Discharge Plan and Services Additional resources added to the After Visit Summary for       Post Acute Care Choice: Skilled Nursing Facility                               Social Determinants of Health (SDOH) Interventions SDOH Screenings   Tobacco Use: Low Risk  (04/10/2023)     Readmission Risk Interventions     No data to display

## 2023-04-15 NOTE — Plan of Care (Signed)
No care plan documentation found in the last 14 hours.

## 2023-04-15 NOTE — Progress Notes (Signed)
Patient discharged yesterday on 04/14/2023. Discharge summary completed 9/10, updated today. Medically stable for discharge.  Stephania Fragmin MD [facility]

## 2023-04-17 DIAGNOSIS — R531 Weakness: Secondary | ICD-10-CM | POA: Diagnosis not present

## 2023-04-17 DIAGNOSIS — R339 Retention of urine, unspecified: Secondary | ICD-10-CM | POA: Diagnosis not present

## 2023-04-17 DIAGNOSIS — E43 Unspecified severe protein-calorie malnutrition: Secondary | ICD-10-CM | POA: Diagnosis not present

## 2023-04-17 DIAGNOSIS — E0590 Thyrotoxicosis, unspecified without thyrotoxic crisis or storm: Secondary | ICD-10-CM | POA: Diagnosis not present

## 2023-04-17 DIAGNOSIS — N390 Urinary tract infection, site not specified: Secondary | ICD-10-CM | POA: Diagnosis not present

## 2023-04-17 DIAGNOSIS — G309 Alzheimer's disease, unspecified: Secondary | ICD-10-CM | POA: Diagnosis not present

## 2023-04-17 DIAGNOSIS — S72002A Fracture of unspecified part of neck of left femur, initial encounter for closed fracture: Secondary | ICD-10-CM | POA: Diagnosis not present

## 2023-04-20 DIAGNOSIS — D638 Anemia in other chronic diseases classified elsewhere: Secondary | ICD-10-CM | POA: Diagnosis not present

## 2023-04-20 DIAGNOSIS — I10 Essential (primary) hypertension: Secondary | ICD-10-CM | POA: Diagnosis not present

## 2023-04-20 DIAGNOSIS — K219 Gastro-esophageal reflux disease without esophagitis: Secondary | ICD-10-CM | POA: Diagnosis not present

## 2023-04-20 DIAGNOSIS — F0390 Unspecified dementia without behavioral disturbance: Secondary | ICD-10-CM | POA: Diagnosis not present

## 2023-04-20 DIAGNOSIS — E0590 Thyrotoxicosis, unspecified without thyrotoxic crisis or storm: Secondary | ICD-10-CM | POA: Diagnosis not present

## 2023-04-20 DIAGNOSIS — E871 Hypo-osmolality and hyponatremia: Secondary | ICD-10-CM | POA: Diagnosis not present

## 2023-04-20 DIAGNOSIS — E785 Hyperlipidemia, unspecified: Secondary | ICD-10-CM | POA: Diagnosis not present

## 2023-04-20 DIAGNOSIS — J45909 Unspecified asthma, uncomplicated: Secondary | ICD-10-CM | POA: Diagnosis not present

## 2023-04-20 DIAGNOSIS — F32A Depression, unspecified: Secondary | ICD-10-CM | POA: Diagnosis not present

## 2023-04-23 DIAGNOSIS — E785 Hyperlipidemia, unspecified: Secondary | ICD-10-CM | POA: Diagnosis not present

## 2023-04-23 DIAGNOSIS — D638 Anemia in other chronic diseases classified elsewhere: Secondary | ICD-10-CM | POA: Diagnosis not present

## 2023-04-23 DIAGNOSIS — E871 Hypo-osmolality and hyponatremia: Secondary | ICD-10-CM | POA: Diagnosis not present

## 2023-04-23 DIAGNOSIS — I10 Essential (primary) hypertension: Secondary | ICD-10-CM | POA: Diagnosis not present

## 2023-04-23 DIAGNOSIS — M84452D Pathological fracture, left femur, subsequent encounter for fracture with routine healing: Secondary | ICD-10-CM | POA: Diagnosis not present

## 2023-04-28 DIAGNOSIS — E0590 Thyrotoxicosis, unspecified without thyrotoxic crisis or storm: Secondary | ICD-10-CM | POA: Diagnosis not present

## 2023-04-28 DIAGNOSIS — I10 Essential (primary) hypertension: Secondary | ICD-10-CM | POA: Diagnosis not present

## 2023-04-28 DIAGNOSIS — R1311 Dysphagia, oral phase: Secondary | ICD-10-CM | POA: Diagnosis not present

## 2023-04-28 DIAGNOSIS — M6281 Muscle weakness (generalized): Secondary | ICD-10-CM | POA: Diagnosis not present

## 2023-04-28 DIAGNOSIS — R41841 Cognitive communication deficit: Secondary | ICD-10-CM | POA: Diagnosis not present

## 2023-04-28 DIAGNOSIS — S72002D Fracture of unspecified part of neck of left femur, subsequent encounter for closed fracture with routine healing: Secondary | ICD-10-CM | POA: Diagnosis not present

## 2023-04-28 DIAGNOSIS — N390 Urinary tract infection, site not specified: Secondary | ICD-10-CM | POA: Diagnosis not present

## 2023-04-28 DIAGNOSIS — R262 Difficulty in walking, not elsewhere classified: Secondary | ICD-10-CM | POA: Diagnosis not present

## 2023-04-29 DIAGNOSIS — N390 Urinary tract infection, site not specified: Secondary | ICD-10-CM | POA: Diagnosis not present

## 2023-04-30 DIAGNOSIS — R262 Difficulty in walking, not elsewhere classified: Secondary | ICD-10-CM | POA: Diagnosis not present

## 2023-04-30 DIAGNOSIS — E0590 Thyrotoxicosis, unspecified without thyrotoxic crisis or storm: Secondary | ICD-10-CM | POA: Diagnosis not present

## 2023-04-30 DIAGNOSIS — N390 Urinary tract infection, site not specified: Secondary | ICD-10-CM | POA: Diagnosis not present

## 2023-04-30 DIAGNOSIS — R1311 Dysphagia, oral phase: Secondary | ICD-10-CM | POA: Diagnosis not present

## 2023-04-30 DIAGNOSIS — I10 Essential (primary) hypertension: Secondary | ICD-10-CM | POA: Diagnosis not present

## 2023-04-30 DIAGNOSIS — M6281 Muscle weakness (generalized): Secondary | ICD-10-CM | POA: Diagnosis not present

## 2023-04-30 DIAGNOSIS — R41841 Cognitive communication deficit: Secondary | ICD-10-CM | POA: Diagnosis not present

## 2023-04-30 DIAGNOSIS — S72002D Fracture of unspecified part of neck of left femur, subsequent encounter for closed fracture with routine healing: Secondary | ICD-10-CM | POA: Diagnosis not present

## 2023-05-01 DIAGNOSIS — N390 Urinary tract infection, site not specified: Secondary | ICD-10-CM | POA: Diagnosis not present

## 2023-05-03 DIAGNOSIS — F32A Depression, unspecified: Secondary | ICD-10-CM | POA: Diagnosis not present

## 2023-05-03 DIAGNOSIS — K5900 Constipation, unspecified: Secondary | ICD-10-CM | POA: Diagnosis not present

## 2023-05-03 DIAGNOSIS — K219 Gastro-esophageal reflux disease without esophagitis: Secondary | ICD-10-CM | POA: Diagnosis not present

## 2023-05-03 DIAGNOSIS — F0390 Unspecified dementia without behavioral disturbance: Secondary | ICD-10-CM | POA: Diagnosis not present

## 2023-05-03 DIAGNOSIS — I10 Essential (primary) hypertension: Secondary | ICD-10-CM | POA: Diagnosis not present

## 2023-05-03 DIAGNOSIS — J45909 Unspecified asthma, uncomplicated: Secondary | ICD-10-CM | POA: Diagnosis not present

## 2023-05-05 DIAGNOSIS — N390 Urinary tract infection, site not specified: Secondary | ICD-10-CM | POA: Diagnosis not present

## 2023-05-06 DIAGNOSIS — J45909 Unspecified asthma, uncomplicated: Secondary | ICD-10-CM | POA: Diagnosis not present

## 2023-05-06 DIAGNOSIS — M84452D Pathological fracture, left femur, subsequent encounter for fracture with routine healing: Secondary | ICD-10-CM | POA: Diagnosis not present

## 2023-05-06 DIAGNOSIS — R339 Retention of urine, unspecified: Secondary | ICD-10-CM | POA: Diagnosis not present

## 2023-05-06 DIAGNOSIS — I10 Essential (primary) hypertension: Secondary | ICD-10-CM | POA: Diagnosis not present

## 2023-05-06 DIAGNOSIS — F0390 Unspecified dementia without behavioral disturbance: Secondary | ICD-10-CM | POA: Diagnosis not present

## 2023-05-08 DIAGNOSIS — M6281 Muscle weakness (generalized): Secondary | ICD-10-CM | POA: Diagnosis not present

## 2023-05-08 DIAGNOSIS — E0590 Thyrotoxicosis, unspecified without thyrotoxic crisis or storm: Secondary | ICD-10-CM | POA: Diagnosis not present

## 2023-05-08 DIAGNOSIS — R1311 Dysphagia, oral phase: Secondary | ICD-10-CM | POA: Diagnosis not present

## 2023-05-08 DIAGNOSIS — N390 Urinary tract infection, site not specified: Secondary | ICD-10-CM | POA: Diagnosis not present

## 2023-05-08 DIAGNOSIS — S72002D Fracture of unspecified part of neck of left femur, subsequent encounter for closed fracture with routine healing: Secondary | ICD-10-CM | POA: Diagnosis not present

## 2023-05-08 DIAGNOSIS — R41841 Cognitive communication deficit: Secondary | ICD-10-CM | POA: Diagnosis not present

## 2023-05-08 DIAGNOSIS — I10 Essential (primary) hypertension: Secondary | ICD-10-CM | POA: Diagnosis not present

## 2023-05-08 DIAGNOSIS — R262 Difficulty in walking, not elsewhere classified: Secondary | ICD-10-CM | POA: Diagnosis not present

## 2023-05-09 LAB — CUP PACEART REMOTE DEVICE CHECK
Battery Remaining Longevity: 35 mo
Battery Remaining Percentage: 29 %
Battery Voltage: 2.95 V
Brady Statistic AP VP Percent: 7.8 %
Brady Statistic AP VS Percent: 1 %
Brady Statistic AS VP Percent: 90 %
Brady Statistic AS VS Percent: 1.3 %
Brady Statistic RA Percent Paced: 7.1 %
Brady Statistic RV Percent Paced: 98 %
Date Time Interrogation Session: 20241004020013
Implantable Lead Connection Status: 753985
Implantable Lead Connection Status: 753985
Implantable Lead Implant Date: 20170417
Implantable Lead Implant Date: 20170417
Implantable Lead Location: 753859
Implantable Lead Location: 753860
Implantable Pulse Generator Implant Date: 20170417
Lead Channel Impedance Value: 400 Ohm
Lead Channel Impedance Value: 430 Ohm
Lead Channel Pacing Threshold Amplitude: 0.5 V
Lead Channel Pacing Threshold Amplitude: 0.75 V
Lead Channel Pacing Threshold Pulse Width: 0.5 ms
Lead Channel Pacing Threshold Pulse Width: 0.5 ms
Lead Channel Sensing Intrinsic Amplitude: 1.1 mV
Lead Channel Sensing Intrinsic Amplitude: 8.2 mV
Lead Channel Setting Pacing Amplitude: 1 V
Lead Channel Setting Pacing Amplitude: 1.5 V
Lead Channel Setting Pacing Pulse Width: 0.5 ms
Lead Channel Setting Sensing Sensitivity: 2 mV
Pulse Gen Model: 2272
Pulse Gen Serial Number: 7883162

## 2023-05-11 ENCOUNTER — Ambulatory Visit (INDEPENDENT_AMBULATORY_CARE_PROVIDER_SITE_OTHER): Payer: Medicare PPO

## 2023-05-11 DIAGNOSIS — R55 Syncope and collapse: Secondary | ICD-10-CM | POA: Diagnosis not present

## 2023-05-12 DIAGNOSIS — R1311 Dysphagia, oral phase: Secondary | ICD-10-CM | POA: Diagnosis not present

## 2023-05-12 DIAGNOSIS — R262 Difficulty in walking, not elsewhere classified: Secondary | ICD-10-CM | POA: Diagnosis not present

## 2023-05-12 DIAGNOSIS — R062 Wheezing: Secondary | ICD-10-CM | POA: Diagnosis not present

## 2023-05-12 DIAGNOSIS — M6281 Muscle weakness (generalized): Secondary | ICD-10-CM | POA: Diagnosis not present

## 2023-05-12 DIAGNOSIS — E0590 Thyrotoxicosis, unspecified without thyrotoxic crisis or storm: Secondary | ICD-10-CM | POA: Diagnosis not present

## 2023-05-12 DIAGNOSIS — I10 Essential (primary) hypertension: Secondary | ICD-10-CM | POA: Diagnosis not present

## 2023-05-12 DIAGNOSIS — R41841 Cognitive communication deficit: Secondary | ICD-10-CM | POA: Diagnosis not present

## 2023-05-12 DIAGNOSIS — N390 Urinary tract infection, site not specified: Secondary | ICD-10-CM | POA: Diagnosis not present

## 2023-05-12 DIAGNOSIS — S72002D Fracture of unspecified part of neck of left femur, subsequent encounter for closed fracture with routine healing: Secondary | ICD-10-CM | POA: Diagnosis not present

## 2023-05-13 DIAGNOSIS — N390 Urinary tract infection, site not specified: Secondary | ICD-10-CM | POA: Diagnosis not present

## 2023-05-13 DIAGNOSIS — M6281 Muscle weakness (generalized): Secondary | ICD-10-CM | POA: Diagnosis not present

## 2023-05-14 DIAGNOSIS — N390 Urinary tract infection, site not specified: Secondary | ICD-10-CM | POA: Diagnosis not present

## 2023-05-15 DIAGNOSIS — S72002D Fracture of unspecified part of neck of left femur, subsequent encounter for closed fracture with routine healing: Secondary | ICD-10-CM | POA: Diagnosis not present

## 2023-05-15 DIAGNOSIS — I10 Essential (primary) hypertension: Secondary | ICD-10-CM | POA: Diagnosis not present

## 2023-05-15 DIAGNOSIS — N390 Urinary tract infection, site not specified: Secondary | ICD-10-CM | POA: Diagnosis not present

## 2023-05-15 DIAGNOSIS — R41841 Cognitive communication deficit: Secondary | ICD-10-CM | POA: Diagnosis not present

## 2023-05-15 DIAGNOSIS — R262 Difficulty in walking, not elsewhere classified: Secondary | ICD-10-CM | POA: Diagnosis not present

## 2023-05-15 DIAGNOSIS — R1311 Dysphagia, oral phase: Secondary | ICD-10-CM | POA: Diagnosis not present

## 2023-05-15 DIAGNOSIS — E0590 Thyrotoxicosis, unspecified without thyrotoxic crisis or storm: Secondary | ICD-10-CM | POA: Diagnosis not present

## 2023-05-15 DIAGNOSIS — M6281 Muscle weakness (generalized): Secondary | ICD-10-CM | POA: Diagnosis not present

## 2023-05-18 DIAGNOSIS — S72002D Fracture of unspecified part of neck of left femur, subsequent encounter for closed fracture with routine healing: Secondary | ICD-10-CM | POA: Diagnosis not present

## 2023-05-18 DIAGNOSIS — Z79891 Long term (current) use of opiate analgesic: Secondary | ICD-10-CM | POA: Diagnosis not present

## 2023-05-18 DIAGNOSIS — I10 Essential (primary) hypertension: Secondary | ICD-10-CM | POA: Diagnosis not present

## 2023-05-18 DIAGNOSIS — L89626 Pressure-induced deep tissue damage of left heel: Secondary | ICD-10-CM | POA: Diagnosis not present

## 2023-05-18 DIAGNOSIS — F03B0 Unspecified dementia, moderate, without behavioral disturbance, psychotic disturbance, mood disturbance, and anxiety: Secondary | ICD-10-CM | POA: Diagnosis not present

## 2023-05-18 DIAGNOSIS — E440 Moderate protein-calorie malnutrition: Secondary | ICD-10-CM | POA: Diagnosis not present

## 2023-05-18 DIAGNOSIS — R339 Retention of urine, unspecified: Secondary | ICD-10-CM | POA: Diagnosis not present

## 2023-05-18 DIAGNOSIS — L89302 Pressure ulcer of unspecified buttock, stage 2: Secondary | ICD-10-CM | POA: Diagnosis not present

## 2023-05-18 DIAGNOSIS — E0590 Thyrotoxicosis, unspecified without thyrotoxic crisis or storm: Secondary | ICD-10-CM | POA: Diagnosis not present

## 2023-05-21 DIAGNOSIS — I10 Essential (primary) hypertension: Secondary | ICD-10-CM | POA: Diagnosis not present

## 2023-05-21 DIAGNOSIS — L89302 Pressure ulcer of unspecified buttock, stage 2: Secondary | ICD-10-CM | POA: Diagnosis not present

## 2023-05-21 DIAGNOSIS — R339 Retention of urine, unspecified: Secondary | ICD-10-CM | POA: Diagnosis not present

## 2023-05-21 DIAGNOSIS — Z79891 Long term (current) use of opiate analgesic: Secondary | ICD-10-CM | POA: Diagnosis not present

## 2023-05-21 DIAGNOSIS — E0590 Thyrotoxicosis, unspecified without thyrotoxic crisis or storm: Secondary | ICD-10-CM | POA: Diagnosis not present

## 2023-05-21 DIAGNOSIS — E440 Moderate protein-calorie malnutrition: Secondary | ICD-10-CM | POA: Diagnosis not present

## 2023-05-21 DIAGNOSIS — F03B0 Unspecified dementia, moderate, without behavioral disturbance, psychotic disturbance, mood disturbance, and anxiety: Secondary | ICD-10-CM | POA: Diagnosis not present

## 2023-05-21 DIAGNOSIS — L89626 Pressure-induced deep tissue damage of left heel: Secondary | ICD-10-CM | POA: Diagnosis not present

## 2023-05-21 DIAGNOSIS — S72002D Fracture of unspecified part of neck of left femur, subsequent encounter for closed fracture with routine healing: Secondary | ICD-10-CM | POA: Diagnosis not present

## 2023-05-25 DIAGNOSIS — L89626 Pressure-induced deep tissue damage of left heel: Secondary | ICD-10-CM | POA: Diagnosis not present

## 2023-05-25 DIAGNOSIS — E440 Moderate protein-calorie malnutrition: Secondary | ICD-10-CM | POA: Diagnosis not present

## 2023-05-25 DIAGNOSIS — N179 Acute kidney failure, unspecified: Secondary | ICD-10-CM | POA: Diagnosis not present

## 2023-05-25 DIAGNOSIS — F03B0 Unspecified dementia, moderate, without behavioral disturbance, psychotic disturbance, mood disturbance, and anxiety: Secondary | ICD-10-CM | POA: Diagnosis not present

## 2023-05-25 DIAGNOSIS — L89302 Pressure ulcer of unspecified buttock, stage 2: Secondary | ICD-10-CM | POA: Diagnosis not present

## 2023-05-25 DIAGNOSIS — S72002D Fracture of unspecified part of neck of left femur, subsequent encounter for closed fracture with routine healing: Secondary | ICD-10-CM | POA: Diagnosis not present

## 2023-05-25 DIAGNOSIS — R339 Retention of urine, unspecified: Secondary | ICD-10-CM | POA: Diagnosis not present

## 2023-05-25 DIAGNOSIS — Z79891 Long term (current) use of opiate analgesic: Secondary | ICD-10-CM | POA: Diagnosis not present

## 2023-05-25 DIAGNOSIS — I10 Essential (primary) hypertension: Secondary | ICD-10-CM | POA: Diagnosis not present

## 2023-05-25 DIAGNOSIS — E0590 Thyrotoxicosis, unspecified without thyrotoxic crisis or storm: Secondary | ICD-10-CM | POA: Diagnosis not present

## 2023-05-26 DIAGNOSIS — Z79891 Long term (current) use of opiate analgesic: Secondary | ICD-10-CM | POA: Diagnosis not present

## 2023-05-26 DIAGNOSIS — E440 Moderate protein-calorie malnutrition: Secondary | ICD-10-CM | POA: Diagnosis not present

## 2023-05-26 DIAGNOSIS — F03B0 Unspecified dementia, moderate, without behavioral disturbance, psychotic disturbance, mood disturbance, and anxiety: Secondary | ICD-10-CM | POA: Diagnosis not present

## 2023-05-26 DIAGNOSIS — R339 Retention of urine, unspecified: Secondary | ICD-10-CM | POA: Diagnosis not present

## 2023-05-26 DIAGNOSIS — S72002D Fracture of unspecified part of neck of left femur, subsequent encounter for closed fracture with routine healing: Secondary | ICD-10-CM | POA: Diagnosis not present

## 2023-05-26 DIAGNOSIS — I10 Essential (primary) hypertension: Secondary | ICD-10-CM | POA: Diagnosis not present

## 2023-05-26 DIAGNOSIS — E0590 Thyrotoxicosis, unspecified without thyrotoxic crisis or storm: Secondary | ICD-10-CM | POA: Diagnosis not present

## 2023-05-26 DIAGNOSIS — L89626 Pressure-induced deep tissue damage of left heel: Secondary | ICD-10-CM | POA: Diagnosis not present

## 2023-05-26 DIAGNOSIS — L89302 Pressure ulcer of unspecified buttock, stage 2: Secondary | ICD-10-CM | POA: Diagnosis not present

## 2023-05-26 NOTE — Progress Notes (Signed)
Remote pacemaker transmission.   

## 2023-05-27 DIAGNOSIS — K921 Melena: Secondary | ICD-10-CM | POA: Diagnosis not present

## 2023-05-27 DIAGNOSIS — R339 Retention of urine, unspecified: Secondary | ICD-10-CM | POA: Diagnosis not present

## 2023-05-28 DIAGNOSIS — R339 Retention of urine, unspecified: Secondary | ICD-10-CM | POA: Diagnosis not present

## 2023-05-28 DIAGNOSIS — Z79891 Long term (current) use of opiate analgesic: Secondary | ICD-10-CM | POA: Diagnosis not present

## 2023-05-28 DIAGNOSIS — S72002D Fracture of unspecified part of neck of left femur, subsequent encounter for closed fracture with routine healing: Secondary | ICD-10-CM | POA: Diagnosis not present

## 2023-05-28 DIAGNOSIS — F03B0 Unspecified dementia, moderate, without behavioral disturbance, psychotic disturbance, mood disturbance, and anxiety: Secondary | ICD-10-CM | POA: Diagnosis not present

## 2023-05-28 DIAGNOSIS — L89302 Pressure ulcer of unspecified buttock, stage 2: Secondary | ICD-10-CM | POA: Diagnosis not present

## 2023-05-28 DIAGNOSIS — L89626 Pressure-induced deep tissue damage of left heel: Secondary | ICD-10-CM | POA: Diagnosis not present

## 2023-05-28 DIAGNOSIS — I10 Essential (primary) hypertension: Secondary | ICD-10-CM | POA: Diagnosis not present

## 2023-05-28 DIAGNOSIS — E0590 Thyrotoxicosis, unspecified without thyrotoxic crisis or storm: Secondary | ICD-10-CM | POA: Diagnosis not present

## 2023-05-28 DIAGNOSIS — E440 Moderate protein-calorie malnutrition: Secondary | ICD-10-CM | POA: Diagnosis not present

## 2023-05-29 DIAGNOSIS — F03B0 Unspecified dementia, moderate, without behavioral disturbance, psychotic disturbance, mood disturbance, and anxiety: Secondary | ICD-10-CM | POA: Diagnosis not present

## 2023-05-29 DIAGNOSIS — E0590 Thyrotoxicosis, unspecified without thyrotoxic crisis or storm: Secondary | ICD-10-CM | POA: Diagnosis not present

## 2023-05-29 DIAGNOSIS — L89626 Pressure-induced deep tissue damage of left heel: Secondary | ICD-10-CM | POA: Diagnosis not present

## 2023-05-29 DIAGNOSIS — Z79891 Long term (current) use of opiate analgesic: Secondary | ICD-10-CM | POA: Diagnosis not present

## 2023-05-29 DIAGNOSIS — S72002D Fracture of unspecified part of neck of left femur, subsequent encounter for closed fracture with routine healing: Secondary | ICD-10-CM | POA: Diagnosis not present

## 2023-05-29 DIAGNOSIS — R339 Retention of urine, unspecified: Secondary | ICD-10-CM | POA: Diagnosis not present

## 2023-05-29 DIAGNOSIS — L89302 Pressure ulcer of unspecified buttock, stage 2: Secondary | ICD-10-CM | POA: Diagnosis not present

## 2023-05-29 DIAGNOSIS — I10 Essential (primary) hypertension: Secondary | ICD-10-CM | POA: Diagnosis not present

## 2023-05-29 DIAGNOSIS — E440 Moderate protein-calorie malnutrition: Secondary | ICD-10-CM | POA: Diagnosis not present

## 2023-06-01 DIAGNOSIS — L89626 Pressure-induced deep tissue damage of left heel: Secondary | ICD-10-CM | POA: Diagnosis not present

## 2023-06-01 DIAGNOSIS — F03B0 Unspecified dementia, moderate, without behavioral disturbance, psychotic disturbance, mood disturbance, and anxiety: Secondary | ICD-10-CM | POA: Diagnosis not present

## 2023-06-01 DIAGNOSIS — E440 Moderate protein-calorie malnutrition: Secondary | ICD-10-CM | POA: Diagnosis not present

## 2023-06-01 DIAGNOSIS — R339 Retention of urine, unspecified: Secondary | ICD-10-CM | POA: Diagnosis not present

## 2023-06-01 DIAGNOSIS — L89302 Pressure ulcer of unspecified buttock, stage 2: Secondary | ICD-10-CM | POA: Diagnosis not present

## 2023-06-01 DIAGNOSIS — E0590 Thyrotoxicosis, unspecified without thyrotoxic crisis or storm: Secondary | ICD-10-CM | POA: Diagnosis not present

## 2023-06-01 DIAGNOSIS — Z79891 Long term (current) use of opiate analgesic: Secondary | ICD-10-CM | POA: Diagnosis not present

## 2023-06-01 DIAGNOSIS — I10 Essential (primary) hypertension: Secondary | ICD-10-CM | POA: Diagnosis not present

## 2023-06-01 DIAGNOSIS — S72002D Fracture of unspecified part of neck of left femur, subsequent encounter for closed fracture with routine healing: Secondary | ICD-10-CM | POA: Diagnosis not present

## 2023-06-02 DIAGNOSIS — S72002D Fracture of unspecified part of neck of left femur, subsequent encounter for closed fracture with routine healing: Secondary | ICD-10-CM | POA: Diagnosis not present

## 2023-06-02 DIAGNOSIS — Z79891 Long term (current) use of opiate analgesic: Secondary | ICD-10-CM | POA: Diagnosis not present

## 2023-06-02 DIAGNOSIS — E0590 Thyrotoxicosis, unspecified without thyrotoxic crisis or storm: Secondary | ICD-10-CM | POA: Diagnosis not present

## 2023-06-02 DIAGNOSIS — F03B0 Unspecified dementia, moderate, without behavioral disturbance, psychotic disturbance, mood disturbance, and anxiety: Secondary | ICD-10-CM | POA: Diagnosis not present

## 2023-06-02 DIAGNOSIS — E440 Moderate protein-calorie malnutrition: Secondary | ICD-10-CM | POA: Diagnosis not present

## 2023-06-02 DIAGNOSIS — I10 Essential (primary) hypertension: Secondary | ICD-10-CM | POA: Diagnosis not present

## 2023-06-02 DIAGNOSIS — R339 Retention of urine, unspecified: Secondary | ICD-10-CM | POA: Diagnosis not present

## 2023-06-02 DIAGNOSIS — L89302 Pressure ulcer of unspecified buttock, stage 2: Secondary | ICD-10-CM | POA: Diagnosis not present

## 2023-06-02 DIAGNOSIS — L89626 Pressure-induced deep tissue damage of left heel: Secondary | ICD-10-CM | POA: Diagnosis not present

## 2023-06-04 DIAGNOSIS — Z79891 Long term (current) use of opiate analgesic: Secondary | ICD-10-CM | POA: Diagnosis not present

## 2023-06-04 DIAGNOSIS — L89626 Pressure-induced deep tissue damage of left heel: Secondary | ICD-10-CM | POA: Diagnosis not present

## 2023-06-04 DIAGNOSIS — E440 Moderate protein-calorie malnutrition: Secondary | ICD-10-CM | POA: Diagnosis not present

## 2023-06-04 DIAGNOSIS — L89302 Pressure ulcer of unspecified buttock, stage 2: Secondary | ICD-10-CM | POA: Diagnosis not present

## 2023-06-04 DIAGNOSIS — R339 Retention of urine, unspecified: Secondary | ICD-10-CM | POA: Diagnosis not present

## 2023-06-04 DIAGNOSIS — E0590 Thyrotoxicosis, unspecified without thyrotoxic crisis or storm: Secondary | ICD-10-CM | POA: Diagnosis not present

## 2023-06-04 DIAGNOSIS — F03B0 Unspecified dementia, moderate, without behavioral disturbance, psychotic disturbance, mood disturbance, and anxiety: Secondary | ICD-10-CM | POA: Diagnosis not present

## 2023-06-04 DIAGNOSIS — S72002D Fracture of unspecified part of neck of left femur, subsequent encounter for closed fracture with routine healing: Secondary | ICD-10-CM | POA: Diagnosis not present

## 2023-06-04 DIAGNOSIS — I10 Essential (primary) hypertension: Secondary | ICD-10-CM | POA: Diagnosis not present

## 2023-06-08 DIAGNOSIS — F03B0 Unspecified dementia, moderate, without behavioral disturbance, psychotic disturbance, mood disturbance, and anxiety: Secondary | ICD-10-CM | POA: Diagnosis not present

## 2023-06-08 DIAGNOSIS — L89302 Pressure ulcer of unspecified buttock, stage 2: Secondary | ICD-10-CM | POA: Diagnosis not present

## 2023-06-08 DIAGNOSIS — S72002D Fracture of unspecified part of neck of left femur, subsequent encounter for closed fracture with routine healing: Secondary | ICD-10-CM | POA: Diagnosis not present

## 2023-06-08 DIAGNOSIS — E440 Moderate protein-calorie malnutrition: Secondary | ICD-10-CM | POA: Diagnosis not present

## 2023-06-08 DIAGNOSIS — R339 Retention of urine, unspecified: Secondary | ICD-10-CM | POA: Diagnosis not present

## 2023-06-08 DIAGNOSIS — Z79891 Long term (current) use of opiate analgesic: Secondary | ICD-10-CM | POA: Diagnosis not present

## 2023-06-08 DIAGNOSIS — I10 Essential (primary) hypertension: Secondary | ICD-10-CM | POA: Diagnosis not present

## 2023-06-08 DIAGNOSIS — E0590 Thyrotoxicosis, unspecified without thyrotoxic crisis or storm: Secondary | ICD-10-CM | POA: Diagnosis not present

## 2023-06-08 DIAGNOSIS — L89626 Pressure-induced deep tissue damage of left heel: Secondary | ICD-10-CM | POA: Diagnosis not present

## 2023-06-10 DIAGNOSIS — E440 Moderate protein-calorie malnutrition: Secondary | ICD-10-CM | POA: Diagnosis not present

## 2023-06-10 DIAGNOSIS — F03B0 Unspecified dementia, moderate, without behavioral disturbance, psychotic disturbance, mood disturbance, and anxiety: Secondary | ICD-10-CM | POA: Diagnosis not present

## 2023-06-10 DIAGNOSIS — L89302 Pressure ulcer of unspecified buttock, stage 2: Secondary | ICD-10-CM | POA: Diagnosis not present

## 2023-06-10 DIAGNOSIS — E0590 Thyrotoxicosis, unspecified without thyrotoxic crisis or storm: Secondary | ICD-10-CM | POA: Diagnosis not present

## 2023-06-10 DIAGNOSIS — L89626 Pressure-induced deep tissue damage of left heel: Secondary | ICD-10-CM | POA: Diagnosis not present

## 2023-06-10 DIAGNOSIS — R339 Retention of urine, unspecified: Secondary | ICD-10-CM | POA: Diagnosis not present

## 2023-06-10 DIAGNOSIS — Z79891 Long term (current) use of opiate analgesic: Secondary | ICD-10-CM | POA: Diagnosis not present

## 2023-06-10 DIAGNOSIS — I10 Essential (primary) hypertension: Secondary | ICD-10-CM | POA: Diagnosis not present

## 2023-06-10 DIAGNOSIS — S72002D Fracture of unspecified part of neck of left femur, subsequent encounter for closed fracture with routine healing: Secondary | ICD-10-CM | POA: Diagnosis not present

## 2023-06-11 DIAGNOSIS — E0590 Thyrotoxicosis, unspecified without thyrotoxic crisis or storm: Secondary | ICD-10-CM | POA: Diagnosis not present

## 2023-06-11 DIAGNOSIS — R339 Retention of urine, unspecified: Secondary | ICD-10-CM | POA: Diagnosis not present

## 2023-06-11 DIAGNOSIS — E440 Moderate protein-calorie malnutrition: Secondary | ICD-10-CM | POA: Diagnosis not present

## 2023-06-11 DIAGNOSIS — Z79891 Long term (current) use of opiate analgesic: Secondary | ICD-10-CM | POA: Diagnosis not present

## 2023-06-11 DIAGNOSIS — L89626 Pressure-induced deep tissue damage of left heel: Secondary | ICD-10-CM | POA: Diagnosis not present

## 2023-06-11 DIAGNOSIS — L89302 Pressure ulcer of unspecified buttock, stage 2: Secondary | ICD-10-CM | POA: Diagnosis not present

## 2023-06-11 DIAGNOSIS — I10 Essential (primary) hypertension: Secondary | ICD-10-CM | POA: Diagnosis not present

## 2023-06-11 DIAGNOSIS — S72002D Fracture of unspecified part of neck of left femur, subsequent encounter for closed fracture with routine healing: Secondary | ICD-10-CM | POA: Diagnosis not present

## 2023-06-11 DIAGNOSIS — F03B0 Unspecified dementia, moderate, without behavioral disturbance, psychotic disturbance, mood disturbance, and anxiety: Secondary | ICD-10-CM | POA: Diagnosis not present

## 2023-06-13 DIAGNOSIS — M6281 Muscle weakness (generalized): Secondary | ICD-10-CM | POA: Diagnosis not present

## 2023-06-17 DIAGNOSIS — E440 Moderate protein-calorie malnutrition: Secondary | ICD-10-CM | POA: Diagnosis not present

## 2023-06-17 DIAGNOSIS — R339 Retention of urine, unspecified: Secondary | ICD-10-CM | POA: Diagnosis not present

## 2023-06-17 DIAGNOSIS — I10 Essential (primary) hypertension: Secondary | ICD-10-CM | POA: Diagnosis not present

## 2023-06-17 DIAGNOSIS — L89626 Pressure-induced deep tissue damage of left heel: Secondary | ICD-10-CM | POA: Diagnosis not present

## 2023-06-17 DIAGNOSIS — L89302 Pressure ulcer of unspecified buttock, stage 2: Secondary | ICD-10-CM | POA: Diagnosis not present

## 2023-06-17 DIAGNOSIS — Z79891 Long term (current) use of opiate analgesic: Secondary | ICD-10-CM | POA: Diagnosis not present

## 2023-06-17 DIAGNOSIS — S72002D Fracture of unspecified part of neck of left femur, subsequent encounter for closed fracture with routine healing: Secondary | ICD-10-CM | POA: Diagnosis not present

## 2023-06-17 DIAGNOSIS — F03B0 Unspecified dementia, moderate, without behavioral disturbance, psychotic disturbance, mood disturbance, and anxiety: Secondary | ICD-10-CM | POA: Diagnosis not present

## 2023-06-17 DIAGNOSIS — E0590 Thyrotoxicosis, unspecified without thyrotoxic crisis or storm: Secondary | ICD-10-CM | POA: Diagnosis not present

## 2023-06-18 DIAGNOSIS — S72002D Fracture of unspecified part of neck of left femur, subsequent encounter for closed fracture with routine healing: Secondary | ICD-10-CM | POA: Diagnosis not present

## 2023-06-18 DIAGNOSIS — I10 Essential (primary) hypertension: Secondary | ICD-10-CM | POA: Diagnosis not present

## 2023-06-18 DIAGNOSIS — R339 Retention of urine, unspecified: Secondary | ICD-10-CM | POA: Diagnosis not present

## 2023-06-18 DIAGNOSIS — E440 Moderate protein-calorie malnutrition: Secondary | ICD-10-CM | POA: Diagnosis not present

## 2023-06-18 DIAGNOSIS — E0590 Thyrotoxicosis, unspecified without thyrotoxic crisis or storm: Secondary | ICD-10-CM | POA: Diagnosis not present

## 2023-06-18 DIAGNOSIS — L89302 Pressure ulcer of unspecified buttock, stage 2: Secondary | ICD-10-CM | POA: Diagnosis not present

## 2023-06-18 DIAGNOSIS — L89626 Pressure-induced deep tissue damage of left heel: Secondary | ICD-10-CM | POA: Diagnosis not present

## 2023-06-18 DIAGNOSIS — Z79891 Long term (current) use of opiate analgesic: Secondary | ICD-10-CM | POA: Diagnosis not present

## 2023-06-18 DIAGNOSIS — F03B0 Unspecified dementia, moderate, without behavioral disturbance, psychotic disturbance, mood disturbance, and anxiety: Secondary | ICD-10-CM | POA: Diagnosis not present

## 2023-06-19 DIAGNOSIS — S72002D Fracture of unspecified part of neck of left femur, subsequent encounter for closed fracture with routine healing: Secondary | ICD-10-CM | POA: Diagnosis not present

## 2023-06-19 DIAGNOSIS — Z79891 Long term (current) use of opiate analgesic: Secondary | ICD-10-CM | POA: Diagnosis not present

## 2023-06-19 DIAGNOSIS — F03B0 Unspecified dementia, moderate, without behavioral disturbance, psychotic disturbance, mood disturbance, and anxiety: Secondary | ICD-10-CM | POA: Diagnosis not present

## 2023-06-19 DIAGNOSIS — L89626 Pressure-induced deep tissue damage of left heel: Secondary | ICD-10-CM | POA: Diagnosis not present

## 2023-06-19 DIAGNOSIS — L89302 Pressure ulcer of unspecified buttock, stage 2: Secondary | ICD-10-CM | POA: Diagnosis not present

## 2023-06-19 DIAGNOSIS — E440 Moderate protein-calorie malnutrition: Secondary | ICD-10-CM | POA: Diagnosis not present

## 2023-06-19 DIAGNOSIS — I10 Essential (primary) hypertension: Secondary | ICD-10-CM | POA: Diagnosis not present

## 2023-06-19 DIAGNOSIS — E0590 Thyrotoxicosis, unspecified without thyrotoxic crisis or storm: Secondary | ICD-10-CM | POA: Diagnosis not present

## 2023-06-19 DIAGNOSIS — R339 Retention of urine, unspecified: Secondary | ICD-10-CM | POA: Diagnosis not present

## 2023-06-22 DIAGNOSIS — E0590 Thyrotoxicosis, unspecified without thyrotoxic crisis or storm: Secondary | ICD-10-CM | POA: Diagnosis not present

## 2023-06-22 DIAGNOSIS — L89302 Pressure ulcer of unspecified buttock, stage 2: Secondary | ICD-10-CM | POA: Diagnosis not present

## 2023-06-22 DIAGNOSIS — L89626 Pressure-induced deep tissue damage of left heel: Secondary | ICD-10-CM | POA: Diagnosis not present

## 2023-06-22 DIAGNOSIS — E440 Moderate protein-calorie malnutrition: Secondary | ICD-10-CM | POA: Diagnosis not present

## 2023-06-22 DIAGNOSIS — R339 Retention of urine, unspecified: Secondary | ICD-10-CM | POA: Diagnosis not present

## 2023-06-22 DIAGNOSIS — Z79891 Long term (current) use of opiate analgesic: Secondary | ICD-10-CM | POA: Diagnosis not present

## 2023-06-22 DIAGNOSIS — F03B0 Unspecified dementia, moderate, without behavioral disturbance, psychotic disturbance, mood disturbance, and anxiety: Secondary | ICD-10-CM | POA: Diagnosis not present

## 2023-06-22 DIAGNOSIS — S72002D Fracture of unspecified part of neck of left femur, subsequent encounter for closed fracture with routine healing: Secondary | ICD-10-CM | POA: Diagnosis not present

## 2023-06-22 DIAGNOSIS — I10 Essential (primary) hypertension: Secondary | ICD-10-CM | POA: Diagnosis not present

## 2023-06-24 DIAGNOSIS — Z993 Dependence on wheelchair: Secondary | ICD-10-CM | POA: Diagnosis not present

## 2023-06-24 DIAGNOSIS — N3090 Cystitis, unspecified without hematuria: Secondary | ICD-10-CM | POA: Diagnosis not present

## 2023-06-24 DIAGNOSIS — I10 Essential (primary) hypertension: Secondary | ICD-10-CM | POA: Diagnosis not present

## 2023-06-24 DIAGNOSIS — Z9181 History of falling: Secondary | ICD-10-CM | POA: Diagnosis not present

## 2023-06-24 DIAGNOSIS — D649 Anemia, unspecified: Secondary | ICD-10-CM | POA: Diagnosis not present

## 2023-06-24 DIAGNOSIS — E039 Hypothyroidism, unspecified: Secondary | ICD-10-CM | POA: Diagnosis not present

## 2023-06-24 DIAGNOSIS — Z23 Encounter for immunization: Secondary | ICD-10-CM | POA: Diagnosis not present

## 2023-06-24 DIAGNOSIS — E559 Vitamin D deficiency, unspecified: Secondary | ICD-10-CM | POA: Diagnosis not present

## 2023-06-26 DIAGNOSIS — L89626 Pressure-induced deep tissue damage of left heel: Secondary | ICD-10-CM | POA: Diagnosis not present

## 2023-06-26 DIAGNOSIS — S72002D Fracture of unspecified part of neck of left femur, subsequent encounter for closed fracture with routine healing: Secondary | ICD-10-CM | POA: Diagnosis not present

## 2023-06-26 DIAGNOSIS — F03B0 Unspecified dementia, moderate, without behavioral disturbance, psychotic disturbance, mood disturbance, and anxiety: Secondary | ICD-10-CM | POA: Diagnosis not present

## 2023-06-26 DIAGNOSIS — E440 Moderate protein-calorie malnutrition: Secondary | ICD-10-CM | POA: Diagnosis not present

## 2023-06-26 DIAGNOSIS — E0590 Thyrotoxicosis, unspecified without thyrotoxic crisis or storm: Secondary | ICD-10-CM | POA: Diagnosis not present

## 2023-06-26 DIAGNOSIS — I10 Essential (primary) hypertension: Secondary | ICD-10-CM | POA: Diagnosis not present

## 2023-06-26 DIAGNOSIS — Z79891 Long term (current) use of opiate analgesic: Secondary | ICD-10-CM | POA: Diagnosis not present

## 2023-06-26 DIAGNOSIS — R339 Retention of urine, unspecified: Secondary | ICD-10-CM | POA: Diagnosis not present

## 2023-06-26 DIAGNOSIS — L89302 Pressure ulcer of unspecified buttock, stage 2: Secondary | ICD-10-CM | POA: Diagnosis not present

## 2023-06-29 DIAGNOSIS — S72002D Fracture of unspecified part of neck of left femur, subsequent encounter for closed fracture with routine healing: Secondary | ICD-10-CM | POA: Diagnosis not present

## 2023-06-29 DIAGNOSIS — Z79891 Long term (current) use of opiate analgesic: Secondary | ICD-10-CM | POA: Diagnosis not present

## 2023-06-29 DIAGNOSIS — F03B0 Unspecified dementia, moderate, without behavioral disturbance, psychotic disturbance, mood disturbance, and anxiety: Secondary | ICD-10-CM | POA: Diagnosis not present

## 2023-06-29 DIAGNOSIS — L89302 Pressure ulcer of unspecified buttock, stage 2: Secondary | ICD-10-CM | POA: Diagnosis not present

## 2023-06-29 DIAGNOSIS — E440 Moderate protein-calorie malnutrition: Secondary | ICD-10-CM | POA: Diagnosis not present

## 2023-06-29 DIAGNOSIS — L89626 Pressure-induced deep tissue damage of left heel: Secondary | ICD-10-CM | POA: Diagnosis not present

## 2023-06-29 DIAGNOSIS — E0590 Thyrotoxicosis, unspecified without thyrotoxic crisis or storm: Secondary | ICD-10-CM | POA: Diagnosis not present

## 2023-06-29 DIAGNOSIS — I10 Essential (primary) hypertension: Secondary | ICD-10-CM | POA: Diagnosis not present

## 2023-06-29 DIAGNOSIS — R339 Retention of urine, unspecified: Secondary | ICD-10-CM | POA: Diagnosis not present

## 2023-06-30 DIAGNOSIS — K5909 Other constipation: Secondary | ICD-10-CM | POA: Diagnosis not present

## 2023-06-30 DIAGNOSIS — D509 Iron deficiency anemia, unspecified: Secondary | ICD-10-CM | POA: Diagnosis not present

## 2023-06-30 DIAGNOSIS — K921 Melena: Secondary | ICD-10-CM | POA: Diagnosis not present

## 2023-06-30 DIAGNOSIS — L89626 Pressure-induced deep tissue damage of left heel: Secondary | ICD-10-CM | POA: Diagnosis not present

## 2023-06-30 DIAGNOSIS — E440 Moderate protein-calorie malnutrition: Secondary | ICD-10-CM | POA: Diagnosis not present

## 2023-06-30 DIAGNOSIS — L89302 Pressure ulcer of unspecified buttock, stage 2: Secondary | ICD-10-CM | POA: Diagnosis not present

## 2023-06-30 DIAGNOSIS — S72002D Fracture of unspecified part of neck of left femur, subsequent encounter for closed fracture with routine healing: Secondary | ICD-10-CM | POA: Diagnosis not present

## 2023-06-30 DIAGNOSIS — E0590 Thyrotoxicosis, unspecified without thyrotoxic crisis or storm: Secondary | ICD-10-CM | POA: Diagnosis not present

## 2023-06-30 DIAGNOSIS — F03B0 Unspecified dementia, moderate, without behavioral disturbance, psychotic disturbance, mood disturbance, and anxiety: Secondary | ICD-10-CM | POA: Diagnosis not present

## 2023-06-30 DIAGNOSIS — I10 Essential (primary) hypertension: Secondary | ICD-10-CM | POA: Diagnosis not present

## 2023-06-30 DIAGNOSIS — R339 Retention of urine, unspecified: Secondary | ICD-10-CM | POA: Diagnosis not present

## 2023-06-30 DIAGNOSIS — Z79891 Long term (current) use of opiate analgesic: Secondary | ICD-10-CM | POA: Diagnosis not present

## 2023-07-01 DIAGNOSIS — E440 Moderate protein-calorie malnutrition: Secondary | ICD-10-CM | POA: Diagnosis not present

## 2023-07-01 DIAGNOSIS — L89302 Pressure ulcer of unspecified buttock, stage 2: Secondary | ICD-10-CM | POA: Diagnosis not present

## 2023-07-01 DIAGNOSIS — I10 Essential (primary) hypertension: Secondary | ICD-10-CM | POA: Diagnosis not present

## 2023-07-01 DIAGNOSIS — F03B0 Unspecified dementia, moderate, without behavioral disturbance, psychotic disturbance, mood disturbance, and anxiety: Secondary | ICD-10-CM | POA: Diagnosis not present

## 2023-07-01 DIAGNOSIS — S72002D Fracture of unspecified part of neck of left femur, subsequent encounter for closed fracture with routine healing: Secondary | ICD-10-CM | POA: Diagnosis not present

## 2023-07-01 DIAGNOSIS — L89626 Pressure-induced deep tissue damage of left heel: Secondary | ICD-10-CM | POA: Diagnosis not present

## 2023-07-01 DIAGNOSIS — E0590 Thyrotoxicosis, unspecified without thyrotoxic crisis or storm: Secondary | ICD-10-CM | POA: Diagnosis not present

## 2023-07-01 DIAGNOSIS — Z79891 Long term (current) use of opiate analgesic: Secondary | ICD-10-CM | POA: Diagnosis not present

## 2023-07-01 DIAGNOSIS — R339 Retention of urine, unspecified: Secondary | ICD-10-CM | POA: Diagnosis not present

## 2023-07-06 DIAGNOSIS — E0590 Thyrotoxicosis, unspecified without thyrotoxic crisis or storm: Secondary | ICD-10-CM | POA: Diagnosis not present

## 2023-07-06 DIAGNOSIS — L89302 Pressure ulcer of unspecified buttock, stage 2: Secondary | ICD-10-CM | POA: Diagnosis not present

## 2023-07-06 DIAGNOSIS — S72002D Fracture of unspecified part of neck of left femur, subsequent encounter for closed fracture with routine healing: Secondary | ICD-10-CM | POA: Diagnosis not present

## 2023-07-06 DIAGNOSIS — Z79891 Long term (current) use of opiate analgesic: Secondary | ICD-10-CM | POA: Diagnosis not present

## 2023-07-06 DIAGNOSIS — F03B0 Unspecified dementia, moderate, without behavioral disturbance, psychotic disturbance, mood disturbance, and anxiety: Secondary | ICD-10-CM | POA: Diagnosis not present

## 2023-07-06 DIAGNOSIS — L89626 Pressure-induced deep tissue damage of left heel: Secondary | ICD-10-CM | POA: Diagnosis not present

## 2023-07-06 DIAGNOSIS — E440 Moderate protein-calorie malnutrition: Secondary | ICD-10-CM | POA: Diagnosis not present

## 2023-07-06 DIAGNOSIS — I10 Essential (primary) hypertension: Secondary | ICD-10-CM | POA: Diagnosis not present

## 2023-07-06 DIAGNOSIS — R339 Retention of urine, unspecified: Secondary | ICD-10-CM | POA: Diagnosis not present

## 2023-07-08 DIAGNOSIS — I10 Essential (primary) hypertension: Secondary | ICD-10-CM | POA: Diagnosis not present

## 2023-07-08 DIAGNOSIS — R339 Retention of urine, unspecified: Secondary | ICD-10-CM | POA: Diagnosis not present

## 2023-07-08 DIAGNOSIS — E440 Moderate protein-calorie malnutrition: Secondary | ICD-10-CM | POA: Diagnosis not present

## 2023-07-08 DIAGNOSIS — S72002D Fracture of unspecified part of neck of left femur, subsequent encounter for closed fracture with routine healing: Secondary | ICD-10-CM | POA: Diagnosis not present

## 2023-07-08 DIAGNOSIS — Z79891 Long term (current) use of opiate analgesic: Secondary | ICD-10-CM | POA: Diagnosis not present

## 2023-07-08 DIAGNOSIS — E0590 Thyrotoxicosis, unspecified without thyrotoxic crisis or storm: Secondary | ICD-10-CM | POA: Diagnosis not present

## 2023-07-08 DIAGNOSIS — L89626 Pressure-induced deep tissue damage of left heel: Secondary | ICD-10-CM | POA: Diagnosis not present

## 2023-07-08 DIAGNOSIS — F03B0 Unspecified dementia, moderate, without behavioral disturbance, psychotic disturbance, mood disturbance, and anxiety: Secondary | ICD-10-CM | POA: Diagnosis not present

## 2023-07-08 DIAGNOSIS — L89302 Pressure ulcer of unspecified buttock, stage 2: Secondary | ICD-10-CM | POA: Diagnosis not present

## 2023-07-13 DIAGNOSIS — M6281 Muscle weakness (generalized): Secondary | ICD-10-CM | POA: Diagnosis not present

## 2023-07-14 DIAGNOSIS — I10 Essential (primary) hypertension: Secondary | ICD-10-CM | POA: Diagnosis not present

## 2023-07-14 DIAGNOSIS — E440 Moderate protein-calorie malnutrition: Secondary | ICD-10-CM | POA: Diagnosis not present

## 2023-07-14 DIAGNOSIS — L89302 Pressure ulcer of unspecified buttock, stage 2: Secondary | ICD-10-CM | POA: Diagnosis not present

## 2023-07-14 DIAGNOSIS — S72002D Fracture of unspecified part of neck of left femur, subsequent encounter for closed fracture with routine healing: Secondary | ICD-10-CM | POA: Diagnosis not present

## 2023-07-14 DIAGNOSIS — F03B0 Unspecified dementia, moderate, without behavioral disturbance, psychotic disturbance, mood disturbance, and anxiety: Secondary | ICD-10-CM | POA: Diagnosis not present

## 2023-07-14 DIAGNOSIS — Z79891 Long term (current) use of opiate analgesic: Secondary | ICD-10-CM | POA: Diagnosis not present

## 2023-07-14 DIAGNOSIS — E0590 Thyrotoxicosis, unspecified without thyrotoxic crisis or storm: Secondary | ICD-10-CM | POA: Diagnosis not present

## 2023-07-14 DIAGNOSIS — L89626 Pressure-induced deep tissue damage of left heel: Secondary | ICD-10-CM | POA: Diagnosis not present

## 2023-07-14 DIAGNOSIS — R339 Retention of urine, unspecified: Secondary | ICD-10-CM | POA: Diagnosis not present

## 2023-07-15 DIAGNOSIS — N281 Cyst of kidney, acquired: Secondary | ICD-10-CM | POA: Diagnosis not present

## 2023-07-15 DIAGNOSIS — R339 Retention of urine, unspecified: Secondary | ICD-10-CM | POA: Diagnosis not present

## 2023-07-16 DIAGNOSIS — F03B0 Unspecified dementia, moderate, without behavioral disturbance, psychotic disturbance, mood disturbance, and anxiety: Secondary | ICD-10-CM | POA: Diagnosis not present

## 2023-07-16 DIAGNOSIS — Z79891 Long term (current) use of opiate analgesic: Secondary | ICD-10-CM | POA: Diagnosis not present

## 2023-07-16 DIAGNOSIS — E440 Moderate protein-calorie malnutrition: Secondary | ICD-10-CM | POA: Diagnosis not present

## 2023-07-16 DIAGNOSIS — R339 Retention of urine, unspecified: Secondary | ICD-10-CM | POA: Diagnosis not present

## 2023-07-16 DIAGNOSIS — L89302 Pressure ulcer of unspecified buttock, stage 2: Secondary | ICD-10-CM | POA: Diagnosis not present

## 2023-07-16 DIAGNOSIS — E0590 Thyrotoxicosis, unspecified without thyrotoxic crisis or storm: Secondary | ICD-10-CM | POA: Diagnosis not present

## 2023-07-16 DIAGNOSIS — I10 Essential (primary) hypertension: Secondary | ICD-10-CM | POA: Diagnosis not present

## 2023-07-16 DIAGNOSIS — S72002D Fracture of unspecified part of neck of left femur, subsequent encounter for closed fracture with routine healing: Secondary | ICD-10-CM | POA: Diagnosis not present

## 2023-07-16 DIAGNOSIS — L89626 Pressure-induced deep tissue damage of left heel: Secondary | ICD-10-CM | POA: Diagnosis not present

## 2023-07-17 DIAGNOSIS — S72002D Fracture of unspecified part of neck of left femur, subsequent encounter for closed fracture with routine healing: Secondary | ICD-10-CM | POA: Diagnosis not present

## 2023-07-17 DIAGNOSIS — I10 Essential (primary) hypertension: Secondary | ICD-10-CM | POA: Diagnosis not present

## 2023-07-17 DIAGNOSIS — E0590 Thyrotoxicosis, unspecified without thyrotoxic crisis or storm: Secondary | ICD-10-CM | POA: Diagnosis not present

## 2023-07-17 DIAGNOSIS — E440 Moderate protein-calorie malnutrition: Secondary | ICD-10-CM | POA: Diagnosis not present

## 2023-07-17 DIAGNOSIS — L89626 Pressure-induced deep tissue damage of left heel: Secondary | ICD-10-CM | POA: Diagnosis not present

## 2023-07-17 DIAGNOSIS — Z79891 Long term (current) use of opiate analgesic: Secondary | ICD-10-CM | POA: Diagnosis not present

## 2023-07-17 DIAGNOSIS — L89302 Pressure ulcer of unspecified buttock, stage 2: Secondary | ICD-10-CM | POA: Diagnosis not present

## 2023-07-17 DIAGNOSIS — R339 Retention of urine, unspecified: Secondary | ICD-10-CM | POA: Diagnosis not present

## 2023-07-17 DIAGNOSIS — F03B0 Unspecified dementia, moderate, without behavioral disturbance, psychotic disturbance, mood disturbance, and anxiety: Secondary | ICD-10-CM | POA: Diagnosis not present

## 2023-08-04 DIAGNOSIS — L89302 Pressure ulcer of unspecified buttock, stage 2: Secondary | ICD-10-CM | POA: Diagnosis not present

## 2023-08-04 DIAGNOSIS — R339 Retention of urine, unspecified: Secondary | ICD-10-CM | POA: Diagnosis not present

## 2023-08-04 DIAGNOSIS — F03B0 Unspecified dementia, moderate, without behavioral disturbance, psychotic disturbance, mood disturbance, and anxiety: Secondary | ICD-10-CM | POA: Diagnosis not present

## 2023-08-04 DIAGNOSIS — I10 Essential (primary) hypertension: Secondary | ICD-10-CM | POA: Diagnosis not present

## 2023-08-04 DIAGNOSIS — E0590 Thyrotoxicosis, unspecified without thyrotoxic crisis or storm: Secondary | ICD-10-CM | POA: Diagnosis not present

## 2023-08-04 DIAGNOSIS — E440 Moderate protein-calorie malnutrition: Secondary | ICD-10-CM | POA: Diagnosis not present

## 2023-08-04 DIAGNOSIS — S72002D Fracture of unspecified part of neck of left femur, subsequent encounter for closed fracture with routine healing: Secondary | ICD-10-CM | POA: Diagnosis not present

## 2023-08-04 DIAGNOSIS — L89626 Pressure-induced deep tissue damage of left heel: Secondary | ICD-10-CM | POA: Diagnosis not present

## 2023-08-04 DIAGNOSIS — Z79891 Long term (current) use of opiate analgesic: Secondary | ICD-10-CM | POA: Diagnosis not present

## 2023-08-11 ENCOUNTER — Ambulatory Visit (INDEPENDENT_AMBULATORY_CARE_PROVIDER_SITE_OTHER): Payer: Medicare PPO

## 2023-08-11 DIAGNOSIS — R55 Syncope and collapse: Secondary | ICD-10-CM

## 2023-08-12 DIAGNOSIS — R339 Retention of urine, unspecified: Secondary | ICD-10-CM | POA: Diagnosis not present

## 2023-08-12 DIAGNOSIS — E440 Moderate protein-calorie malnutrition: Secondary | ICD-10-CM | POA: Diagnosis not present

## 2023-08-12 DIAGNOSIS — L89626 Pressure-induced deep tissue damage of left heel: Secondary | ICD-10-CM | POA: Diagnosis not present

## 2023-08-12 DIAGNOSIS — S72002D Fracture of unspecified part of neck of left femur, subsequent encounter for closed fracture with routine healing: Secondary | ICD-10-CM | POA: Diagnosis not present

## 2023-08-12 DIAGNOSIS — Z79891 Long term (current) use of opiate analgesic: Secondary | ICD-10-CM | POA: Diagnosis not present

## 2023-08-12 DIAGNOSIS — I10 Essential (primary) hypertension: Secondary | ICD-10-CM | POA: Diagnosis not present

## 2023-08-12 DIAGNOSIS — F03B0 Unspecified dementia, moderate, without behavioral disturbance, psychotic disturbance, mood disturbance, and anxiety: Secondary | ICD-10-CM | POA: Diagnosis not present

## 2023-08-12 DIAGNOSIS — E0590 Thyrotoxicosis, unspecified without thyrotoxic crisis or storm: Secondary | ICD-10-CM | POA: Diagnosis not present

## 2023-08-12 DIAGNOSIS — L89302 Pressure ulcer of unspecified buttock, stage 2: Secondary | ICD-10-CM | POA: Diagnosis not present

## 2023-08-12 LAB — CUP PACEART REMOTE DEVICE CHECK
Battery Remaining Longevity: 29 mo
Battery Remaining Percentage: 26 %
Battery Voltage: 2.93 V
Brady Statistic AP VP Percent: 10 %
Brady Statistic AP VS Percent: 1 %
Brady Statistic AS VP Percent: 87 %
Brady Statistic AS VS Percent: 1.5 %
Brady Statistic RA Percent Paced: 9.8 %
Brady Statistic RV Percent Paced: 98 %
Date Time Interrogation Session: 20250107020012
Implantable Lead Connection Status: 753985
Implantable Lead Connection Status: 753985
Implantable Lead Implant Date: 20170417
Implantable Lead Implant Date: 20170417
Implantable Lead Location: 753859
Implantable Lead Location: 753860
Implantable Pulse Generator Implant Date: 20170417
Lead Channel Impedance Value: 390 Ohm
Lead Channel Impedance Value: 430 Ohm
Lead Channel Pacing Threshold Amplitude: 0.5 V
Lead Channel Pacing Threshold Amplitude: 0.875 V
Lead Channel Pacing Threshold Pulse Width: 0.5 ms
Lead Channel Pacing Threshold Pulse Width: 0.5 ms
Lead Channel Sensing Intrinsic Amplitude: 1.3 mV
Lead Channel Sensing Intrinsic Amplitude: 8.8 mV
Lead Channel Setting Pacing Amplitude: 1.125
Lead Channel Setting Pacing Amplitude: 1.5 V
Lead Channel Setting Pacing Pulse Width: 0.5 ms
Lead Channel Setting Sensing Sensitivity: 2 mV
Pulse Gen Model: 2272
Pulse Gen Serial Number: 7883162

## 2023-08-13 DIAGNOSIS — M6281 Muscle weakness (generalized): Secondary | ICD-10-CM | POA: Diagnosis not present

## 2023-08-16 DIAGNOSIS — L89626 Pressure-induced deep tissue damage of left heel: Secondary | ICD-10-CM | POA: Diagnosis not present

## 2023-08-16 DIAGNOSIS — R339 Retention of urine, unspecified: Secondary | ICD-10-CM | POA: Diagnosis not present

## 2023-08-16 DIAGNOSIS — F03B0 Unspecified dementia, moderate, without behavioral disturbance, psychotic disturbance, mood disturbance, and anxiety: Secondary | ICD-10-CM | POA: Diagnosis not present

## 2023-08-16 DIAGNOSIS — E0590 Thyrotoxicosis, unspecified without thyrotoxic crisis or storm: Secondary | ICD-10-CM | POA: Diagnosis not present

## 2023-08-16 DIAGNOSIS — Z79891 Long term (current) use of opiate analgesic: Secondary | ICD-10-CM | POA: Diagnosis not present

## 2023-08-16 DIAGNOSIS — S72002D Fracture of unspecified part of neck of left femur, subsequent encounter for closed fracture with routine healing: Secondary | ICD-10-CM | POA: Diagnosis not present

## 2023-08-16 DIAGNOSIS — L89302 Pressure ulcer of unspecified buttock, stage 2: Secondary | ICD-10-CM | POA: Diagnosis not present

## 2023-08-16 DIAGNOSIS — E440 Moderate protein-calorie malnutrition: Secondary | ICD-10-CM | POA: Diagnosis not present

## 2023-08-16 DIAGNOSIS — I10 Essential (primary) hypertension: Secondary | ICD-10-CM | POA: Diagnosis not present

## 2023-09-03 DIAGNOSIS — M79642 Pain in left hand: Secondary | ICD-10-CM | POA: Diagnosis not present

## 2023-09-03 DIAGNOSIS — Z96649 Presence of unspecified artificial hip joint: Secondary | ICD-10-CM | POA: Diagnosis not present

## 2023-09-03 DIAGNOSIS — S32592A Other specified fracture of left pubis, initial encounter for closed fracture: Secondary | ICD-10-CM | POA: Diagnosis not present

## 2023-09-03 DIAGNOSIS — Z96642 Presence of left artificial hip joint: Secondary | ICD-10-CM | POA: Diagnosis not present

## 2023-09-11 DIAGNOSIS — L89302 Pressure ulcer of unspecified buttock, stage 2: Secondary | ICD-10-CM | POA: Diagnosis not present

## 2023-09-11 DIAGNOSIS — S72002D Fracture of unspecified part of neck of left femur, subsequent encounter for closed fracture with routine healing: Secondary | ICD-10-CM | POA: Diagnosis not present

## 2023-09-11 DIAGNOSIS — E440 Moderate protein-calorie malnutrition: Secondary | ICD-10-CM | POA: Diagnosis not present

## 2023-09-11 DIAGNOSIS — L89626 Pressure-induced deep tissue damage of left heel: Secondary | ICD-10-CM | POA: Diagnosis not present

## 2023-09-11 DIAGNOSIS — I10 Essential (primary) hypertension: Secondary | ICD-10-CM | POA: Diagnosis not present

## 2023-09-11 DIAGNOSIS — R339 Retention of urine, unspecified: Secondary | ICD-10-CM | POA: Diagnosis not present

## 2023-09-11 DIAGNOSIS — E0590 Thyrotoxicosis, unspecified without thyrotoxic crisis or storm: Secondary | ICD-10-CM | POA: Diagnosis not present

## 2023-09-11 DIAGNOSIS — F03B0 Unspecified dementia, moderate, without behavioral disturbance, psychotic disturbance, mood disturbance, and anxiety: Secondary | ICD-10-CM | POA: Diagnosis not present

## 2023-09-11 DIAGNOSIS — Z79891 Long term (current) use of opiate analgesic: Secondary | ICD-10-CM | POA: Diagnosis not present

## 2023-09-13 DIAGNOSIS — M6281 Muscle weakness (generalized): Secondary | ICD-10-CM | POA: Diagnosis not present

## 2023-09-15 DIAGNOSIS — I10 Essential (primary) hypertension: Secondary | ICD-10-CM | POA: Diagnosis not present

## 2023-09-15 DIAGNOSIS — Z993 Dependence on wheelchair: Secondary | ICD-10-CM | POA: Diagnosis not present

## 2023-09-15 DIAGNOSIS — Z466 Encounter for fitting and adjustment of urinary device: Secondary | ICD-10-CM | POA: Diagnosis not present

## 2023-09-15 DIAGNOSIS — Z79891 Long term (current) use of opiate analgesic: Secondary | ICD-10-CM | POA: Diagnosis not present

## 2023-09-15 DIAGNOSIS — R339 Retention of urine, unspecified: Secondary | ICD-10-CM | POA: Diagnosis not present

## 2023-09-15 DIAGNOSIS — E0590 Thyrotoxicosis, unspecified without thyrotoxic crisis or storm: Secondary | ICD-10-CM | POA: Diagnosis not present

## 2023-09-15 DIAGNOSIS — F03B0 Unspecified dementia, moderate, without behavioral disturbance, psychotic disturbance, mood disturbance, and anxiety: Secondary | ICD-10-CM | POA: Diagnosis not present

## 2023-09-15 DIAGNOSIS — E440 Moderate protein-calorie malnutrition: Secondary | ICD-10-CM | POA: Diagnosis not present

## 2023-09-15 DIAGNOSIS — Z9181 History of falling: Secondary | ICD-10-CM | POA: Diagnosis not present

## 2023-09-16 DIAGNOSIS — R300 Dysuria: Secondary | ICD-10-CM | POA: Diagnosis not present

## 2023-09-21 DIAGNOSIS — I10 Essential (primary) hypertension: Secondary | ICD-10-CM | POA: Diagnosis not present

## 2023-09-21 DIAGNOSIS — E0590 Thyrotoxicosis, unspecified without thyrotoxic crisis or storm: Secondary | ICD-10-CM | POA: Diagnosis not present

## 2023-09-21 DIAGNOSIS — F03B0 Unspecified dementia, moderate, without behavioral disturbance, psychotic disturbance, mood disturbance, and anxiety: Secondary | ICD-10-CM | POA: Diagnosis not present

## 2023-09-21 DIAGNOSIS — Z9181 History of falling: Secondary | ICD-10-CM | POA: Diagnosis not present

## 2023-09-21 DIAGNOSIS — R339 Retention of urine, unspecified: Secondary | ICD-10-CM | POA: Diagnosis not present

## 2023-09-21 DIAGNOSIS — Z466 Encounter for fitting and adjustment of urinary device: Secondary | ICD-10-CM | POA: Diagnosis not present

## 2023-09-21 DIAGNOSIS — Z79891 Long term (current) use of opiate analgesic: Secondary | ICD-10-CM | POA: Diagnosis not present

## 2023-09-21 DIAGNOSIS — E440 Moderate protein-calorie malnutrition: Secondary | ICD-10-CM | POA: Diagnosis not present

## 2023-09-21 DIAGNOSIS — Z993 Dependence on wheelchair: Secondary | ICD-10-CM | POA: Diagnosis not present

## 2023-09-23 NOTE — Addendum Note (Signed)
Addended by: Geralyn Flash D on: 09/23/2023 11:32 AM   Modules accepted: Orders

## 2023-09-23 NOTE — Progress Notes (Signed)
 Remote pacemaker transmission.

## 2023-09-25 DIAGNOSIS — R509 Fever, unspecified: Secondary | ICD-10-CM | POA: Diagnosis not present

## 2023-09-25 DIAGNOSIS — R051 Acute cough: Secondary | ICD-10-CM | POA: Diagnosis not present

## 2023-09-25 DIAGNOSIS — J189 Pneumonia, unspecified organism: Secondary | ICD-10-CM | POA: Diagnosis not present

## 2023-09-28 DIAGNOSIS — E440 Moderate protein-calorie malnutrition: Secondary | ICD-10-CM | POA: Diagnosis not present

## 2023-09-28 DIAGNOSIS — Z9181 History of falling: Secondary | ICD-10-CM | POA: Diagnosis not present

## 2023-09-28 DIAGNOSIS — Z79891 Long term (current) use of opiate analgesic: Secondary | ICD-10-CM | POA: Diagnosis not present

## 2023-09-28 DIAGNOSIS — I10 Essential (primary) hypertension: Secondary | ICD-10-CM | POA: Diagnosis not present

## 2023-09-28 DIAGNOSIS — Z993 Dependence on wheelchair: Secondary | ICD-10-CM | POA: Diagnosis not present

## 2023-09-28 DIAGNOSIS — Z466 Encounter for fitting and adjustment of urinary device: Secondary | ICD-10-CM | POA: Diagnosis not present

## 2023-09-28 DIAGNOSIS — F03B0 Unspecified dementia, moderate, without behavioral disturbance, psychotic disturbance, mood disturbance, and anxiety: Secondary | ICD-10-CM | POA: Diagnosis not present

## 2023-09-28 DIAGNOSIS — E0590 Thyrotoxicosis, unspecified without thyrotoxic crisis or storm: Secondary | ICD-10-CM | POA: Diagnosis not present

## 2023-09-28 DIAGNOSIS — R339 Retention of urine, unspecified: Secondary | ICD-10-CM | POA: Diagnosis not present

## 2023-09-29 DIAGNOSIS — Z79891 Long term (current) use of opiate analgesic: Secondary | ICD-10-CM | POA: Diagnosis not present

## 2023-09-29 DIAGNOSIS — Z993 Dependence on wheelchair: Secondary | ICD-10-CM | POA: Diagnosis not present

## 2023-09-29 DIAGNOSIS — Z9181 History of falling: Secondary | ICD-10-CM | POA: Diagnosis not present

## 2023-09-29 DIAGNOSIS — I10 Essential (primary) hypertension: Secondary | ICD-10-CM | POA: Diagnosis not present

## 2023-09-29 DIAGNOSIS — F03B0 Unspecified dementia, moderate, without behavioral disturbance, psychotic disturbance, mood disturbance, and anxiety: Secondary | ICD-10-CM | POA: Diagnosis not present

## 2023-09-29 DIAGNOSIS — R339 Retention of urine, unspecified: Secondary | ICD-10-CM | POA: Diagnosis not present

## 2023-09-29 DIAGNOSIS — E0590 Thyrotoxicosis, unspecified without thyrotoxic crisis or storm: Secondary | ICD-10-CM | POA: Diagnosis not present

## 2023-09-29 DIAGNOSIS — Z466 Encounter for fitting and adjustment of urinary device: Secondary | ICD-10-CM | POA: Diagnosis not present

## 2023-09-29 DIAGNOSIS — E440 Moderate protein-calorie malnutrition: Secondary | ICD-10-CM | POA: Diagnosis not present

## 2023-09-30 DIAGNOSIS — Z993 Dependence on wheelchair: Secondary | ICD-10-CM | POA: Diagnosis not present

## 2023-09-30 DIAGNOSIS — I10 Essential (primary) hypertension: Secondary | ICD-10-CM | POA: Diagnosis not present

## 2023-09-30 DIAGNOSIS — E440 Moderate protein-calorie malnutrition: Secondary | ICD-10-CM | POA: Diagnosis not present

## 2023-09-30 DIAGNOSIS — Z79891 Long term (current) use of opiate analgesic: Secondary | ICD-10-CM | POA: Diagnosis not present

## 2023-09-30 DIAGNOSIS — F03B0 Unspecified dementia, moderate, without behavioral disturbance, psychotic disturbance, mood disturbance, and anxiety: Secondary | ICD-10-CM | POA: Diagnosis not present

## 2023-09-30 DIAGNOSIS — R339 Retention of urine, unspecified: Secondary | ICD-10-CM | POA: Diagnosis not present

## 2023-09-30 DIAGNOSIS — E0590 Thyrotoxicosis, unspecified without thyrotoxic crisis or storm: Secondary | ICD-10-CM | POA: Diagnosis not present

## 2023-09-30 DIAGNOSIS — Z9181 History of falling: Secondary | ICD-10-CM | POA: Diagnosis not present

## 2023-09-30 DIAGNOSIS — Z466 Encounter for fitting and adjustment of urinary device: Secondary | ICD-10-CM | POA: Diagnosis not present

## 2023-10-01 DIAGNOSIS — F03B0 Unspecified dementia, moderate, without behavioral disturbance, psychotic disturbance, mood disturbance, and anxiety: Secondary | ICD-10-CM | POA: Diagnosis not present

## 2023-10-01 DIAGNOSIS — I10 Essential (primary) hypertension: Secondary | ICD-10-CM | POA: Diagnosis not present

## 2023-10-01 DIAGNOSIS — E440 Moderate protein-calorie malnutrition: Secondary | ICD-10-CM | POA: Diagnosis not present

## 2023-10-01 DIAGNOSIS — Z9181 History of falling: Secondary | ICD-10-CM | POA: Diagnosis not present

## 2023-10-01 DIAGNOSIS — Z993 Dependence on wheelchair: Secondary | ICD-10-CM | POA: Diagnosis not present

## 2023-10-01 DIAGNOSIS — Z466 Encounter for fitting and adjustment of urinary device: Secondary | ICD-10-CM | POA: Diagnosis not present

## 2023-10-01 DIAGNOSIS — E0590 Thyrotoxicosis, unspecified without thyrotoxic crisis or storm: Secondary | ICD-10-CM | POA: Diagnosis not present

## 2023-10-01 DIAGNOSIS — Z79891 Long term (current) use of opiate analgesic: Secondary | ICD-10-CM | POA: Diagnosis not present

## 2023-10-01 DIAGNOSIS — R339 Retention of urine, unspecified: Secondary | ICD-10-CM | POA: Diagnosis not present

## 2023-10-05 DIAGNOSIS — Z79891 Long term (current) use of opiate analgesic: Secondary | ICD-10-CM | POA: Diagnosis not present

## 2023-10-05 DIAGNOSIS — F03B0 Unspecified dementia, moderate, without behavioral disturbance, psychotic disturbance, mood disturbance, and anxiety: Secondary | ICD-10-CM | POA: Diagnosis not present

## 2023-10-05 DIAGNOSIS — E440 Moderate protein-calorie malnutrition: Secondary | ICD-10-CM | POA: Diagnosis not present

## 2023-10-05 DIAGNOSIS — Z466 Encounter for fitting and adjustment of urinary device: Secondary | ICD-10-CM | POA: Diagnosis not present

## 2023-10-05 DIAGNOSIS — E0590 Thyrotoxicosis, unspecified without thyrotoxic crisis or storm: Secondary | ICD-10-CM | POA: Diagnosis not present

## 2023-10-05 DIAGNOSIS — R339 Retention of urine, unspecified: Secondary | ICD-10-CM | POA: Diagnosis not present

## 2023-10-05 DIAGNOSIS — Z993 Dependence on wheelchair: Secondary | ICD-10-CM | POA: Diagnosis not present

## 2023-10-05 DIAGNOSIS — I10 Essential (primary) hypertension: Secondary | ICD-10-CM | POA: Diagnosis not present

## 2023-10-05 DIAGNOSIS — Z9181 History of falling: Secondary | ICD-10-CM | POA: Diagnosis not present

## 2023-10-06 DIAGNOSIS — Z9181 History of falling: Secondary | ICD-10-CM | POA: Diagnosis not present

## 2023-10-06 DIAGNOSIS — E0590 Thyrotoxicosis, unspecified without thyrotoxic crisis or storm: Secondary | ICD-10-CM | POA: Diagnosis not present

## 2023-10-06 DIAGNOSIS — Z466 Encounter for fitting and adjustment of urinary device: Secondary | ICD-10-CM | POA: Diagnosis not present

## 2023-10-06 DIAGNOSIS — E440 Moderate protein-calorie malnutrition: Secondary | ICD-10-CM | POA: Diagnosis not present

## 2023-10-06 DIAGNOSIS — F03B0 Unspecified dementia, moderate, without behavioral disturbance, psychotic disturbance, mood disturbance, and anxiety: Secondary | ICD-10-CM | POA: Diagnosis not present

## 2023-10-06 DIAGNOSIS — Z79891 Long term (current) use of opiate analgesic: Secondary | ICD-10-CM | POA: Diagnosis not present

## 2023-10-06 DIAGNOSIS — R339 Retention of urine, unspecified: Secondary | ICD-10-CM | POA: Diagnosis not present

## 2023-10-06 DIAGNOSIS — Z993 Dependence on wheelchair: Secondary | ICD-10-CM | POA: Diagnosis not present

## 2023-10-06 DIAGNOSIS — I10 Essential (primary) hypertension: Secondary | ICD-10-CM | POA: Diagnosis not present

## 2023-10-08 DIAGNOSIS — F03B0 Unspecified dementia, moderate, without behavioral disturbance, psychotic disturbance, mood disturbance, and anxiety: Secondary | ICD-10-CM | POA: Diagnosis not present

## 2023-10-08 DIAGNOSIS — I10 Essential (primary) hypertension: Secondary | ICD-10-CM | POA: Diagnosis not present

## 2023-10-08 DIAGNOSIS — R339 Retention of urine, unspecified: Secondary | ICD-10-CM | POA: Diagnosis not present

## 2023-10-08 DIAGNOSIS — Z993 Dependence on wheelchair: Secondary | ICD-10-CM | POA: Diagnosis not present

## 2023-10-08 DIAGNOSIS — E0590 Thyrotoxicosis, unspecified without thyrotoxic crisis or storm: Secondary | ICD-10-CM | POA: Diagnosis not present

## 2023-10-08 DIAGNOSIS — Z9181 History of falling: Secondary | ICD-10-CM | POA: Diagnosis not present

## 2023-10-08 DIAGNOSIS — E440 Moderate protein-calorie malnutrition: Secondary | ICD-10-CM | POA: Diagnosis not present

## 2023-10-08 DIAGNOSIS — Z466 Encounter for fitting and adjustment of urinary device: Secondary | ICD-10-CM | POA: Diagnosis not present

## 2023-10-08 DIAGNOSIS — Z79891 Long term (current) use of opiate analgesic: Secondary | ICD-10-CM | POA: Diagnosis not present

## 2023-10-11 DIAGNOSIS — M6281 Muscle weakness (generalized): Secondary | ICD-10-CM | POA: Diagnosis not present

## 2023-10-13 DIAGNOSIS — E0500 Thyrotoxicosis with diffuse goiter without thyrotoxic crisis or storm: Secondary | ICD-10-CM | POA: Diagnosis not present

## 2023-10-13 DIAGNOSIS — N390 Urinary tract infection, site not specified: Secondary | ICD-10-CM | POA: Diagnosis not present

## 2023-10-13 DIAGNOSIS — D649 Anemia, unspecified: Secondary | ICD-10-CM | POA: Diagnosis not present

## 2023-10-13 DIAGNOSIS — E0590 Thyrotoxicosis, unspecified without thyrotoxic crisis or storm: Secondary | ICD-10-CM | POA: Diagnosis not present

## 2023-10-13 DIAGNOSIS — R454 Irritability and anger: Secondary | ICD-10-CM | POA: Diagnosis not present

## 2023-10-14 DIAGNOSIS — Z79891 Long term (current) use of opiate analgesic: Secondary | ICD-10-CM | POA: Diagnosis not present

## 2023-10-14 DIAGNOSIS — I10 Essential (primary) hypertension: Secondary | ICD-10-CM | POA: Diagnosis not present

## 2023-10-14 DIAGNOSIS — E440 Moderate protein-calorie malnutrition: Secondary | ICD-10-CM | POA: Diagnosis not present

## 2023-10-14 DIAGNOSIS — E0590 Thyrotoxicosis, unspecified without thyrotoxic crisis or storm: Secondary | ICD-10-CM | POA: Diagnosis not present

## 2023-10-14 DIAGNOSIS — Z466 Encounter for fitting and adjustment of urinary device: Secondary | ICD-10-CM | POA: Diagnosis not present

## 2023-10-14 DIAGNOSIS — F03B0 Unspecified dementia, moderate, without behavioral disturbance, psychotic disturbance, mood disturbance, and anxiety: Secondary | ICD-10-CM | POA: Diagnosis not present

## 2023-10-14 DIAGNOSIS — Z9181 History of falling: Secondary | ICD-10-CM | POA: Diagnosis not present

## 2023-10-14 DIAGNOSIS — R339 Retention of urine, unspecified: Secondary | ICD-10-CM | POA: Diagnosis not present

## 2023-10-14 DIAGNOSIS — Z993 Dependence on wheelchair: Secondary | ICD-10-CM | POA: Diagnosis not present

## 2023-10-15 DIAGNOSIS — Z993 Dependence on wheelchair: Secondary | ICD-10-CM | POA: Diagnosis not present

## 2023-10-15 DIAGNOSIS — Z9181 History of falling: Secondary | ICD-10-CM | POA: Diagnosis not present

## 2023-10-15 DIAGNOSIS — Z79891 Long term (current) use of opiate analgesic: Secondary | ICD-10-CM | POA: Diagnosis not present

## 2023-10-15 DIAGNOSIS — Z466 Encounter for fitting and adjustment of urinary device: Secondary | ICD-10-CM | POA: Diagnosis not present

## 2023-10-15 DIAGNOSIS — E440 Moderate protein-calorie malnutrition: Secondary | ICD-10-CM | POA: Diagnosis not present

## 2023-10-15 DIAGNOSIS — R339 Retention of urine, unspecified: Secondary | ICD-10-CM | POA: Diagnosis not present

## 2023-10-15 DIAGNOSIS — F03B0 Unspecified dementia, moderate, without behavioral disturbance, psychotic disturbance, mood disturbance, and anxiety: Secondary | ICD-10-CM | POA: Diagnosis not present

## 2023-10-15 DIAGNOSIS — I10 Essential (primary) hypertension: Secondary | ICD-10-CM | POA: Diagnosis not present

## 2023-10-15 DIAGNOSIS — E0590 Thyrotoxicosis, unspecified without thyrotoxic crisis or storm: Secondary | ICD-10-CM | POA: Diagnosis not present

## 2023-10-16 DIAGNOSIS — E440 Moderate protein-calorie malnutrition: Secondary | ICD-10-CM | POA: Diagnosis not present

## 2023-10-16 DIAGNOSIS — Z9181 History of falling: Secondary | ICD-10-CM | POA: Diagnosis not present

## 2023-10-16 DIAGNOSIS — R339 Retention of urine, unspecified: Secondary | ICD-10-CM | POA: Diagnosis not present

## 2023-10-16 DIAGNOSIS — E0590 Thyrotoxicosis, unspecified without thyrotoxic crisis or storm: Secondary | ICD-10-CM | POA: Diagnosis not present

## 2023-10-16 DIAGNOSIS — F03B0 Unspecified dementia, moderate, without behavioral disturbance, psychotic disturbance, mood disturbance, and anxiety: Secondary | ICD-10-CM | POA: Diagnosis not present

## 2023-10-16 DIAGNOSIS — Z466 Encounter for fitting and adjustment of urinary device: Secondary | ICD-10-CM | POA: Diagnosis not present

## 2023-10-16 DIAGNOSIS — Z79891 Long term (current) use of opiate analgesic: Secondary | ICD-10-CM | POA: Diagnosis not present

## 2023-10-16 DIAGNOSIS — Z993 Dependence on wheelchair: Secondary | ICD-10-CM | POA: Diagnosis not present

## 2023-10-16 DIAGNOSIS — I10 Essential (primary) hypertension: Secondary | ICD-10-CM | POA: Diagnosis not present

## 2023-10-19 DIAGNOSIS — I10 Essential (primary) hypertension: Secondary | ICD-10-CM | POA: Diagnosis not present

## 2023-10-19 DIAGNOSIS — Z466 Encounter for fitting and adjustment of urinary device: Secondary | ICD-10-CM | POA: Diagnosis not present

## 2023-10-19 DIAGNOSIS — R339 Retention of urine, unspecified: Secondary | ICD-10-CM | POA: Diagnosis not present

## 2023-10-19 DIAGNOSIS — Z9181 History of falling: Secondary | ICD-10-CM | POA: Diagnosis not present

## 2023-10-19 DIAGNOSIS — Z79891 Long term (current) use of opiate analgesic: Secondary | ICD-10-CM | POA: Diagnosis not present

## 2023-10-19 DIAGNOSIS — E0590 Thyrotoxicosis, unspecified without thyrotoxic crisis or storm: Secondary | ICD-10-CM | POA: Diagnosis not present

## 2023-10-19 DIAGNOSIS — E440 Moderate protein-calorie malnutrition: Secondary | ICD-10-CM | POA: Diagnosis not present

## 2023-10-19 DIAGNOSIS — Z993 Dependence on wheelchair: Secondary | ICD-10-CM | POA: Diagnosis not present

## 2023-10-19 DIAGNOSIS — F03B0 Unspecified dementia, moderate, without behavioral disturbance, psychotic disturbance, mood disturbance, and anxiety: Secondary | ICD-10-CM | POA: Diagnosis not present

## 2023-10-22 DIAGNOSIS — E440 Moderate protein-calorie malnutrition: Secondary | ICD-10-CM | POA: Diagnosis not present

## 2023-10-22 DIAGNOSIS — F03B0 Unspecified dementia, moderate, without behavioral disturbance, psychotic disturbance, mood disturbance, and anxiety: Secondary | ICD-10-CM | POA: Diagnosis not present

## 2023-10-22 DIAGNOSIS — E0590 Thyrotoxicosis, unspecified without thyrotoxic crisis or storm: Secondary | ICD-10-CM | POA: Diagnosis not present

## 2023-10-22 DIAGNOSIS — R339 Retention of urine, unspecified: Secondary | ICD-10-CM | POA: Diagnosis not present

## 2023-10-22 DIAGNOSIS — Z9181 History of falling: Secondary | ICD-10-CM | POA: Diagnosis not present

## 2023-10-22 DIAGNOSIS — Z79891 Long term (current) use of opiate analgesic: Secondary | ICD-10-CM | POA: Diagnosis not present

## 2023-10-22 DIAGNOSIS — Z993 Dependence on wheelchair: Secondary | ICD-10-CM | POA: Diagnosis not present

## 2023-10-22 DIAGNOSIS — Z466 Encounter for fitting and adjustment of urinary device: Secondary | ICD-10-CM | POA: Diagnosis not present

## 2023-10-22 DIAGNOSIS — I10 Essential (primary) hypertension: Secondary | ICD-10-CM | POA: Diagnosis not present

## 2023-10-23 DIAGNOSIS — Z466 Encounter for fitting and adjustment of urinary device: Secondary | ICD-10-CM | POA: Diagnosis not present

## 2023-10-23 DIAGNOSIS — I10 Essential (primary) hypertension: Secondary | ICD-10-CM | POA: Diagnosis not present

## 2023-10-23 DIAGNOSIS — Z79891 Long term (current) use of opiate analgesic: Secondary | ICD-10-CM | POA: Diagnosis not present

## 2023-10-23 DIAGNOSIS — E440 Moderate protein-calorie malnutrition: Secondary | ICD-10-CM | POA: Diagnosis not present

## 2023-10-23 DIAGNOSIS — R339 Retention of urine, unspecified: Secondary | ICD-10-CM | POA: Diagnosis not present

## 2023-10-23 DIAGNOSIS — Z993 Dependence on wheelchair: Secondary | ICD-10-CM | POA: Diagnosis not present

## 2023-10-23 DIAGNOSIS — F03B0 Unspecified dementia, moderate, without behavioral disturbance, psychotic disturbance, mood disturbance, and anxiety: Secondary | ICD-10-CM | POA: Diagnosis not present

## 2023-10-23 DIAGNOSIS — E0590 Thyrotoxicosis, unspecified without thyrotoxic crisis or storm: Secondary | ICD-10-CM | POA: Diagnosis not present

## 2023-10-23 DIAGNOSIS — Z9181 History of falling: Secondary | ICD-10-CM | POA: Diagnosis not present

## 2023-10-26 DIAGNOSIS — R339 Retention of urine, unspecified: Secondary | ICD-10-CM | POA: Diagnosis not present

## 2023-10-26 DIAGNOSIS — Z79891 Long term (current) use of opiate analgesic: Secondary | ICD-10-CM | POA: Diagnosis not present

## 2023-10-26 DIAGNOSIS — Z9181 History of falling: Secondary | ICD-10-CM | POA: Diagnosis not present

## 2023-10-26 DIAGNOSIS — E440 Moderate protein-calorie malnutrition: Secondary | ICD-10-CM | POA: Diagnosis not present

## 2023-10-26 DIAGNOSIS — Z466 Encounter for fitting and adjustment of urinary device: Secondary | ICD-10-CM | POA: Diagnosis not present

## 2023-10-26 DIAGNOSIS — Z993 Dependence on wheelchair: Secondary | ICD-10-CM | POA: Diagnosis not present

## 2023-10-26 DIAGNOSIS — F03B0 Unspecified dementia, moderate, without behavioral disturbance, psychotic disturbance, mood disturbance, and anxiety: Secondary | ICD-10-CM | POA: Diagnosis not present

## 2023-10-26 DIAGNOSIS — I10 Essential (primary) hypertension: Secondary | ICD-10-CM | POA: Diagnosis not present

## 2023-10-26 DIAGNOSIS — E0590 Thyrotoxicosis, unspecified without thyrotoxic crisis or storm: Secondary | ICD-10-CM | POA: Diagnosis not present

## 2023-10-29 DIAGNOSIS — Z79891 Long term (current) use of opiate analgesic: Secondary | ICD-10-CM | POA: Diagnosis not present

## 2023-10-29 DIAGNOSIS — I10 Essential (primary) hypertension: Secondary | ICD-10-CM | POA: Diagnosis not present

## 2023-10-29 DIAGNOSIS — Z466 Encounter for fitting and adjustment of urinary device: Secondary | ICD-10-CM | POA: Diagnosis not present

## 2023-10-29 DIAGNOSIS — Z9181 History of falling: Secondary | ICD-10-CM | POA: Diagnosis not present

## 2023-10-29 DIAGNOSIS — Z993 Dependence on wheelchair: Secondary | ICD-10-CM | POA: Diagnosis not present

## 2023-10-29 DIAGNOSIS — E0590 Thyrotoxicosis, unspecified without thyrotoxic crisis or storm: Secondary | ICD-10-CM | POA: Diagnosis not present

## 2023-10-29 DIAGNOSIS — E440 Moderate protein-calorie malnutrition: Secondary | ICD-10-CM | POA: Diagnosis not present

## 2023-10-29 DIAGNOSIS — R339 Retention of urine, unspecified: Secondary | ICD-10-CM | POA: Diagnosis not present

## 2023-10-29 DIAGNOSIS — F03B0 Unspecified dementia, moderate, without behavioral disturbance, psychotic disturbance, mood disturbance, and anxiety: Secondary | ICD-10-CM | POA: Diagnosis not present

## 2023-11-05 DIAGNOSIS — R339 Retention of urine, unspecified: Secondary | ICD-10-CM | POA: Diagnosis not present

## 2023-11-05 DIAGNOSIS — H53143 Visual discomfort, bilateral: Secondary | ICD-10-CM | POA: Diagnosis not present

## 2023-11-05 DIAGNOSIS — E440 Moderate protein-calorie malnutrition: Secondary | ICD-10-CM | POA: Diagnosis not present

## 2023-11-05 DIAGNOSIS — Z79891 Long term (current) use of opiate analgesic: Secondary | ICD-10-CM | POA: Diagnosis not present

## 2023-11-05 DIAGNOSIS — Z993 Dependence on wheelchair: Secondary | ICD-10-CM | POA: Diagnosis not present

## 2023-11-05 DIAGNOSIS — E0590 Thyrotoxicosis, unspecified without thyrotoxic crisis or storm: Secondary | ICD-10-CM | POA: Diagnosis not present

## 2023-11-05 DIAGNOSIS — H18593 Other hereditary corneal dystrophies, bilateral: Secondary | ICD-10-CM | POA: Diagnosis not present

## 2023-11-05 DIAGNOSIS — Z9849 Cataract extraction status, unspecified eye: Secondary | ICD-10-CM | POA: Diagnosis not present

## 2023-11-05 DIAGNOSIS — I10 Essential (primary) hypertension: Secondary | ICD-10-CM | POA: Diagnosis not present

## 2023-11-05 DIAGNOSIS — H401134 Primary open-angle glaucoma, bilateral, indeterminate stage: Secondary | ICD-10-CM | POA: Diagnosis not present

## 2023-11-05 DIAGNOSIS — F03B0 Unspecified dementia, moderate, without behavioral disturbance, psychotic disturbance, mood disturbance, and anxiety: Secondary | ICD-10-CM | POA: Diagnosis not present

## 2023-11-05 DIAGNOSIS — Z9181 History of falling: Secondary | ICD-10-CM | POA: Diagnosis not present

## 2023-11-05 DIAGNOSIS — Z466 Encounter for fitting and adjustment of urinary device: Secondary | ICD-10-CM | POA: Diagnosis not present

## 2023-11-06 DIAGNOSIS — Z9181 History of falling: Secondary | ICD-10-CM | POA: Diagnosis not present

## 2023-11-06 DIAGNOSIS — E0590 Thyrotoxicosis, unspecified without thyrotoxic crisis or storm: Secondary | ICD-10-CM | POA: Diagnosis not present

## 2023-11-06 DIAGNOSIS — F03B0 Unspecified dementia, moderate, without behavioral disturbance, psychotic disturbance, mood disturbance, and anxiety: Secondary | ICD-10-CM | POA: Diagnosis not present

## 2023-11-06 DIAGNOSIS — E440 Moderate protein-calorie malnutrition: Secondary | ICD-10-CM | POA: Diagnosis not present

## 2023-11-06 DIAGNOSIS — Z466 Encounter for fitting and adjustment of urinary device: Secondary | ICD-10-CM | POA: Diagnosis not present

## 2023-11-06 DIAGNOSIS — Z79891 Long term (current) use of opiate analgesic: Secondary | ICD-10-CM | POA: Diagnosis not present

## 2023-11-06 DIAGNOSIS — I10 Essential (primary) hypertension: Secondary | ICD-10-CM | POA: Diagnosis not present

## 2023-11-06 DIAGNOSIS — Z993 Dependence on wheelchair: Secondary | ICD-10-CM | POA: Diagnosis not present

## 2023-11-06 DIAGNOSIS — R339 Retention of urine, unspecified: Secondary | ICD-10-CM | POA: Diagnosis not present

## 2023-11-09 DIAGNOSIS — Z79891 Long term (current) use of opiate analgesic: Secondary | ICD-10-CM | POA: Diagnosis not present

## 2023-11-09 DIAGNOSIS — Z993 Dependence on wheelchair: Secondary | ICD-10-CM | POA: Diagnosis not present

## 2023-11-09 DIAGNOSIS — Z9181 History of falling: Secondary | ICD-10-CM | POA: Diagnosis not present

## 2023-11-09 DIAGNOSIS — E0590 Thyrotoxicosis, unspecified without thyrotoxic crisis or storm: Secondary | ICD-10-CM | POA: Diagnosis not present

## 2023-11-09 DIAGNOSIS — F03B0 Unspecified dementia, moderate, without behavioral disturbance, psychotic disturbance, mood disturbance, and anxiety: Secondary | ICD-10-CM | POA: Diagnosis not present

## 2023-11-09 DIAGNOSIS — I10 Essential (primary) hypertension: Secondary | ICD-10-CM | POA: Diagnosis not present

## 2023-11-09 DIAGNOSIS — R339 Retention of urine, unspecified: Secondary | ICD-10-CM | POA: Diagnosis not present

## 2023-11-09 DIAGNOSIS — Z466 Encounter for fitting and adjustment of urinary device: Secondary | ICD-10-CM | POA: Diagnosis not present

## 2023-11-09 DIAGNOSIS — E440 Moderate protein-calorie malnutrition: Secondary | ICD-10-CM | POA: Diagnosis not present

## 2023-11-11 ENCOUNTER — Ambulatory Visit (INDEPENDENT_AMBULATORY_CARE_PROVIDER_SITE_OTHER): Payer: Medicare Other

## 2023-11-11 DIAGNOSIS — M6281 Muscle weakness (generalized): Secondary | ICD-10-CM | POA: Diagnosis not present

## 2023-11-11 DIAGNOSIS — I428 Other cardiomyopathies: Secondary | ICD-10-CM

## 2023-11-11 LAB — CUP PACEART REMOTE DEVICE CHECK
Battery Remaining Longevity: 25 mo
Battery Remaining Percentage: 23 %
Battery Voltage: 2.92 V
Brady Statistic AP VP Percent: 7.1 %
Brady Statistic AP VS Percent: 1 %
Brady Statistic AS VP Percent: 91 %
Brady Statistic AS VS Percent: 1.9 %
Brady Statistic RA Percent Paced: 6.7 %
Brady Statistic RV Percent Paced: 98 %
Date Time Interrogation Session: 20250409020019
Implantable Lead Connection Status: 753985
Implantable Lead Connection Status: 753985
Implantable Lead Implant Date: 20170417
Implantable Lead Implant Date: 20170417
Implantable Lead Location: 753859
Implantable Lead Location: 753860
Implantable Pulse Generator Implant Date: 20170417
Lead Channel Impedance Value: 400 Ohm
Lead Channel Impedance Value: 430 Ohm
Lead Channel Pacing Threshold Amplitude: 0.5 V
Lead Channel Pacing Threshold Amplitude: 1 V
Lead Channel Pacing Threshold Pulse Width: 0.5 ms
Lead Channel Pacing Threshold Pulse Width: 0.5 ms
Lead Channel Sensing Intrinsic Amplitude: 1.7 mV
Lead Channel Sensing Intrinsic Amplitude: 7.8 mV
Lead Channel Setting Pacing Amplitude: 1.25 V
Lead Channel Setting Pacing Amplitude: 1.5 V
Lead Channel Setting Pacing Pulse Width: 0.5 ms
Lead Channel Setting Sensing Sensitivity: 2 mV
Pulse Gen Model: 2272
Pulse Gen Serial Number: 7883162

## 2023-11-12 DIAGNOSIS — F03B0 Unspecified dementia, moderate, without behavioral disturbance, psychotic disturbance, mood disturbance, and anxiety: Secondary | ICD-10-CM | POA: Diagnosis not present

## 2023-11-12 DIAGNOSIS — E0590 Thyrotoxicosis, unspecified without thyrotoxic crisis or storm: Secondary | ICD-10-CM | POA: Diagnosis not present

## 2023-11-12 DIAGNOSIS — I10 Essential (primary) hypertension: Secondary | ICD-10-CM | POA: Diagnosis not present

## 2023-11-12 DIAGNOSIS — R339 Retention of urine, unspecified: Secondary | ICD-10-CM | POA: Diagnosis not present

## 2023-11-12 DIAGNOSIS — E440 Moderate protein-calorie malnutrition: Secondary | ICD-10-CM | POA: Diagnosis not present

## 2023-11-12 DIAGNOSIS — Z466 Encounter for fitting and adjustment of urinary device: Secondary | ICD-10-CM | POA: Diagnosis not present

## 2023-11-12 DIAGNOSIS — Z9181 History of falling: Secondary | ICD-10-CM | POA: Diagnosis not present

## 2023-11-12 DIAGNOSIS — Z79891 Long term (current) use of opiate analgesic: Secondary | ICD-10-CM | POA: Diagnosis not present

## 2023-11-12 DIAGNOSIS — Z993 Dependence on wheelchair: Secondary | ICD-10-CM | POA: Diagnosis not present

## 2023-11-14 DIAGNOSIS — Z9181 History of falling: Secondary | ICD-10-CM | POA: Diagnosis not present

## 2023-11-14 DIAGNOSIS — R339 Retention of urine, unspecified: Secondary | ICD-10-CM | POA: Diagnosis not present

## 2023-11-14 DIAGNOSIS — E440 Moderate protein-calorie malnutrition: Secondary | ICD-10-CM | POA: Diagnosis not present

## 2023-11-14 DIAGNOSIS — Z466 Encounter for fitting and adjustment of urinary device: Secondary | ICD-10-CM | POA: Diagnosis not present

## 2023-11-14 DIAGNOSIS — I10 Essential (primary) hypertension: Secondary | ICD-10-CM | POA: Diagnosis not present

## 2023-11-14 DIAGNOSIS — Z993 Dependence on wheelchair: Secondary | ICD-10-CM | POA: Diagnosis not present

## 2023-11-14 DIAGNOSIS — E0590 Thyrotoxicosis, unspecified without thyrotoxic crisis or storm: Secondary | ICD-10-CM | POA: Diagnosis not present

## 2023-11-14 DIAGNOSIS — Z79891 Long term (current) use of opiate analgesic: Secondary | ICD-10-CM | POA: Diagnosis not present

## 2023-11-14 DIAGNOSIS — F03B0 Unspecified dementia, moderate, without behavioral disturbance, psychotic disturbance, mood disturbance, and anxiety: Secondary | ICD-10-CM | POA: Diagnosis not present

## 2023-11-16 DIAGNOSIS — F03B0 Unspecified dementia, moderate, without behavioral disturbance, psychotic disturbance, mood disturbance, and anxiety: Secondary | ICD-10-CM | POA: Diagnosis not present

## 2023-11-16 DIAGNOSIS — I10 Essential (primary) hypertension: Secondary | ICD-10-CM | POA: Diagnosis not present

## 2023-11-16 DIAGNOSIS — E0590 Thyrotoxicosis, unspecified without thyrotoxic crisis or storm: Secondary | ICD-10-CM | POA: Diagnosis not present

## 2023-11-16 DIAGNOSIS — R339 Retention of urine, unspecified: Secondary | ICD-10-CM | POA: Diagnosis not present

## 2023-11-16 DIAGNOSIS — E440 Moderate protein-calorie malnutrition: Secondary | ICD-10-CM | POA: Diagnosis not present

## 2023-11-16 DIAGNOSIS — Z466 Encounter for fitting and adjustment of urinary device: Secondary | ICD-10-CM | POA: Diagnosis not present

## 2023-11-16 DIAGNOSIS — Z79891 Long term (current) use of opiate analgesic: Secondary | ICD-10-CM | POA: Diagnosis not present

## 2023-11-16 DIAGNOSIS — Z993 Dependence on wheelchair: Secondary | ICD-10-CM | POA: Diagnosis not present

## 2023-11-16 DIAGNOSIS — Z9181 History of falling: Secondary | ICD-10-CM | POA: Diagnosis not present

## 2023-11-27 DIAGNOSIS — E440 Moderate protein-calorie malnutrition: Secondary | ICD-10-CM | POA: Diagnosis not present

## 2023-11-27 DIAGNOSIS — E0590 Thyrotoxicosis, unspecified without thyrotoxic crisis or storm: Secondary | ICD-10-CM | POA: Diagnosis not present

## 2023-11-27 DIAGNOSIS — R339 Retention of urine, unspecified: Secondary | ICD-10-CM | POA: Diagnosis not present

## 2023-11-27 DIAGNOSIS — I10 Essential (primary) hypertension: Secondary | ICD-10-CM | POA: Diagnosis not present

## 2023-11-27 DIAGNOSIS — Z993 Dependence on wheelchair: Secondary | ICD-10-CM | POA: Diagnosis not present

## 2023-11-27 DIAGNOSIS — Z9181 History of falling: Secondary | ICD-10-CM | POA: Diagnosis not present

## 2023-11-27 DIAGNOSIS — Z466 Encounter for fitting and adjustment of urinary device: Secondary | ICD-10-CM | POA: Diagnosis not present

## 2023-11-27 DIAGNOSIS — Z79891 Long term (current) use of opiate analgesic: Secondary | ICD-10-CM | POA: Diagnosis not present

## 2023-11-27 DIAGNOSIS — F03B0 Unspecified dementia, moderate, without behavioral disturbance, psychotic disturbance, mood disturbance, and anxiety: Secondary | ICD-10-CM | POA: Diagnosis not present

## 2023-12-08 DIAGNOSIS — S6992XA Unspecified injury of left wrist, hand and finger(s), initial encounter: Secondary | ICD-10-CM | POA: Diagnosis not present

## 2023-12-11 DIAGNOSIS — M6281 Muscle weakness (generalized): Secondary | ICD-10-CM | POA: Diagnosis not present

## 2023-12-14 DIAGNOSIS — Z79891 Long term (current) use of opiate analgesic: Secondary | ICD-10-CM | POA: Diagnosis not present

## 2023-12-14 DIAGNOSIS — Z9181 History of falling: Secondary | ICD-10-CM | POA: Diagnosis not present

## 2023-12-14 DIAGNOSIS — E440 Moderate protein-calorie malnutrition: Secondary | ICD-10-CM | POA: Diagnosis not present

## 2023-12-14 DIAGNOSIS — I10 Essential (primary) hypertension: Secondary | ICD-10-CM | POA: Diagnosis not present

## 2023-12-14 DIAGNOSIS — R339 Retention of urine, unspecified: Secondary | ICD-10-CM | POA: Diagnosis not present

## 2023-12-14 DIAGNOSIS — Z466 Encounter for fitting and adjustment of urinary device: Secondary | ICD-10-CM | POA: Diagnosis not present

## 2023-12-14 DIAGNOSIS — E0590 Thyrotoxicosis, unspecified without thyrotoxic crisis or storm: Secondary | ICD-10-CM | POA: Diagnosis not present

## 2023-12-14 DIAGNOSIS — F03B0 Unspecified dementia, moderate, without behavioral disturbance, psychotic disturbance, mood disturbance, and anxiety: Secondary | ICD-10-CM | POA: Diagnosis not present

## 2023-12-14 DIAGNOSIS — Z993 Dependence on wheelchair: Secondary | ICD-10-CM | POA: Diagnosis not present

## 2023-12-25 DIAGNOSIS — F0390 Unspecified dementia without behavioral disturbance: Secondary | ICD-10-CM | POA: Diagnosis not present

## 2023-12-25 DIAGNOSIS — K5900 Constipation, unspecified: Secondary | ICD-10-CM | POA: Diagnosis not present

## 2023-12-25 DIAGNOSIS — T839XXA Unspecified complication of genitourinary prosthetic device, implant and graft, initial encounter: Secondary | ICD-10-CM | POA: Diagnosis not present

## 2023-12-25 DIAGNOSIS — Z87898 Personal history of other specified conditions: Secondary | ICD-10-CM | POA: Diagnosis not present

## 2023-12-25 NOTE — Progress Notes (Signed)
 Remote pacemaker transmission.

## 2023-12-30 DIAGNOSIS — J4520 Mild intermittent asthma, uncomplicated: Secondary | ICD-10-CM | POA: Diagnosis not present

## 2023-12-30 DIAGNOSIS — R413 Other amnesia: Secondary | ICD-10-CM | POA: Diagnosis not present

## 2023-12-30 DIAGNOSIS — E0590 Thyrotoxicosis, unspecified without thyrotoxic crisis or storm: Secondary | ICD-10-CM | POA: Diagnosis not present

## 2023-12-30 DIAGNOSIS — M810 Age-related osteoporosis without current pathological fracture: Secondary | ICD-10-CM | POA: Diagnosis not present

## 2023-12-30 DIAGNOSIS — Z0000 Encounter for general adult medical examination without abnormal findings: Secondary | ICD-10-CM | POA: Diagnosis not present

## 2023-12-30 DIAGNOSIS — L309 Dermatitis, unspecified: Secondary | ICD-10-CM | POA: Diagnosis not present

## 2023-12-30 DIAGNOSIS — G301 Alzheimer's disease with late onset: Secondary | ICD-10-CM | POA: Diagnosis not present

## 2023-12-30 DIAGNOSIS — R300 Dysuria: Secondary | ICD-10-CM | POA: Diagnosis not present

## 2023-12-30 DIAGNOSIS — R350 Frequency of micturition: Secondary | ICD-10-CM | POA: Diagnosis not present

## 2023-12-30 DIAGNOSIS — I5022 Chronic systolic (congestive) heart failure: Secondary | ICD-10-CM | POA: Diagnosis not present

## 2024-01-08 DIAGNOSIS — Z466 Encounter for fitting and adjustment of urinary device: Secondary | ICD-10-CM | POA: Diagnosis not present

## 2024-01-08 DIAGNOSIS — Z79891 Long term (current) use of opiate analgesic: Secondary | ICD-10-CM | POA: Diagnosis not present

## 2024-01-08 DIAGNOSIS — E0590 Thyrotoxicosis, unspecified without thyrotoxic crisis or storm: Secondary | ICD-10-CM | POA: Diagnosis not present

## 2024-01-08 DIAGNOSIS — E440 Moderate protein-calorie malnutrition: Secondary | ICD-10-CM | POA: Diagnosis not present

## 2024-01-08 DIAGNOSIS — Z9181 History of falling: Secondary | ICD-10-CM | POA: Diagnosis not present

## 2024-01-08 DIAGNOSIS — R339 Retention of urine, unspecified: Secondary | ICD-10-CM | POA: Diagnosis not present

## 2024-01-08 DIAGNOSIS — Z993 Dependence on wheelchair: Secondary | ICD-10-CM | POA: Diagnosis not present

## 2024-01-08 DIAGNOSIS — I10 Essential (primary) hypertension: Secondary | ICD-10-CM | POA: Diagnosis not present

## 2024-01-08 DIAGNOSIS — F03B0 Unspecified dementia, moderate, without behavioral disturbance, psychotic disturbance, mood disturbance, and anxiety: Secondary | ICD-10-CM | POA: Diagnosis not present

## 2024-01-11 DIAGNOSIS — M6281 Muscle weakness (generalized): Secondary | ICD-10-CM | POA: Diagnosis not present

## 2024-01-12 DIAGNOSIS — Z466 Encounter for fitting and adjustment of urinary device: Secondary | ICD-10-CM | POA: Diagnosis not present

## 2024-01-12 DIAGNOSIS — K5909 Other constipation: Secondary | ICD-10-CM | POA: Diagnosis not present

## 2024-01-12 DIAGNOSIS — R339 Retention of urine, unspecified: Secondary | ICD-10-CM | POA: Diagnosis not present

## 2024-02-10 DIAGNOSIS — M6281 Muscle weakness (generalized): Secondary | ICD-10-CM | POA: Diagnosis not present

## 2024-02-11 ENCOUNTER — Ambulatory Visit (INDEPENDENT_AMBULATORY_CARE_PROVIDER_SITE_OTHER): Payer: Medicare Other

## 2024-02-11 DIAGNOSIS — H16141 Punctate keratitis, right eye: Secondary | ICD-10-CM | POA: Diagnosis not present

## 2024-02-11 DIAGNOSIS — I428 Other cardiomyopathies: Secondary | ICD-10-CM | POA: Diagnosis not present

## 2024-02-11 DIAGNOSIS — H11821 Conjunctivochalasis, right eye: Secondary | ICD-10-CM | POA: Diagnosis not present

## 2024-02-12 DIAGNOSIS — Z9181 History of falling: Secondary | ICD-10-CM | POA: Diagnosis not present

## 2024-02-12 DIAGNOSIS — E0590 Thyrotoxicosis, unspecified without thyrotoxic crisis or storm: Secondary | ICD-10-CM | POA: Diagnosis not present

## 2024-02-12 DIAGNOSIS — F03B0 Unspecified dementia, moderate, without behavioral disturbance, psychotic disturbance, mood disturbance, and anxiety: Secondary | ICD-10-CM | POA: Diagnosis not present

## 2024-02-12 DIAGNOSIS — E440 Moderate protein-calorie malnutrition: Secondary | ICD-10-CM | POA: Diagnosis not present

## 2024-02-12 DIAGNOSIS — Z79891 Long term (current) use of opiate analgesic: Secondary | ICD-10-CM | POA: Diagnosis not present

## 2024-02-12 DIAGNOSIS — I10 Essential (primary) hypertension: Secondary | ICD-10-CM | POA: Diagnosis not present

## 2024-02-12 DIAGNOSIS — R339 Retention of urine, unspecified: Secondary | ICD-10-CM | POA: Diagnosis not present

## 2024-02-12 DIAGNOSIS — Z993 Dependence on wheelchair: Secondary | ICD-10-CM | POA: Diagnosis not present

## 2024-02-12 LAB — CUP PACEART REMOTE DEVICE CHECK
Battery Remaining Longevity: 23 mo
Battery Remaining Percentage: 20 %
Battery Voltage: 2.9 V
Brady Statistic AP VP Percent: 5.5 %
Brady Statistic AP VS Percent: 1 %
Brady Statistic AS VP Percent: 92 %
Brady Statistic AS VS Percent: 1.7 %
Brady Statistic RA Percent Paced: 5.2 %
Brady Statistic RV Percent Paced: 98 %
Date Time Interrogation Session: 20250710020017
Implantable Lead Connection Status: 753985
Implantable Lead Connection Status: 753985
Implantable Lead Implant Date: 20170417
Implantable Lead Implant Date: 20170417
Implantable Lead Location: 753859
Implantable Lead Location: 753860
Implantable Pulse Generator Implant Date: 20170417
Lead Channel Impedance Value: 390 Ohm
Lead Channel Impedance Value: 410 Ohm
Lead Channel Pacing Threshold Amplitude: 0.375 V
Lead Channel Pacing Threshold Amplitude: 0.875 V
Lead Channel Pacing Threshold Pulse Width: 0.5 ms
Lead Channel Pacing Threshold Pulse Width: 0.5 ms
Lead Channel Sensing Intrinsic Amplitude: 1.5 mV
Lead Channel Sensing Intrinsic Amplitude: 12 mV
Lead Channel Setting Pacing Amplitude: 1.125
Lead Channel Setting Pacing Amplitude: 1.375
Lead Channel Setting Pacing Pulse Width: 0.5 ms
Lead Channel Setting Sensing Sensitivity: 2 mV
Pulse Gen Model: 2272
Pulse Gen Serial Number: 7883162

## 2024-02-15 ENCOUNTER — Ambulatory Visit: Payer: Self-pay | Admitting: Internal Medicine

## 2024-02-15 DIAGNOSIS — I10 Essential (primary) hypertension: Secondary | ICD-10-CM | POA: Diagnosis not present

## 2024-02-15 DIAGNOSIS — Z466 Encounter for fitting and adjustment of urinary device: Secondary | ICD-10-CM | POA: Diagnosis not present

## 2024-02-15 DIAGNOSIS — E440 Moderate protein-calorie malnutrition: Secondary | ICD-10-CM | POA: Diagnosis not present

## 2024-02-15 DIAGNOSIS — Z9181 History of falling: Secondary | ICD-10-CM | POA: Diagnosis not present

## 2024-02-15 DIAGNOSIS — Z79891 Long term (current) use of opiate analgesic: Secondary | ICD-10-CM | POA: Diagnosis not present

## 2024-02-15 DIAGNOSIS — F03B0 Unspecified dementia, moderate, without behavioral disturbance, psychotic disturbance, mood disturbance, and anxiety: Secondary | ICD-10-CM | POA: Diagnosis not present

## 2024-02-15 DIAGNOSIS — E0590 Thyrotoxicosis, unspecified without thyrotoxic crisis or storm: Secondary | ICD-10-CM | POA: Diagnosis not present

## 2024-02-15 DIAGNOSIS — Z993 Dependence on wheelchair: Secondary | ICD-10-CM | POA: Diagnosis not present

## 2024-02-15 DIAGNOSIS — R339 Retention of urine, unspecified: Secondary | ICD-10-CM | POA: Diagnosis not present

## 2024-02-16 DIAGNOSIS — N281 Cyst of kidney, acquired: Secondary | ICD-10-CM | POA: Diagnosis not present

## 2024-02-16 DIAGNOSIS — N390 Urinary tract infection, site not specified: Secondary | ICD-10-CM | POA: Diagnosis not present

## 2024-03-07 DIAGNOSIS — N390 Urinary tract infection, site not specified: Secondary | ICD-10-CM | POA: Diagnosis not present

## 2024-03-10 DIAGNOSIS — Z9181 History of falling: Secondary | ICD-10-CM | POA: Diagnosis not present

## 2024-03-10 DIAGNOSIS — Z466 Encounter for fitting and adjustment of urinary device: Secondary | ICD-10-CM | POA: Diagnosis not present

## 2024-03-10 DIAGNOSIS — E440 Moderate protein-calorie malnutrition: Secondary | ICD-10-CM | POA: Diagnosis not present

## 2024-03-10 DIAGNOSIS — Z79891 Long term (current) use of opiate analgesic: Secondary | ICD-10-CM | POA: Diagnosis not present

## 2024-03-10 DIAGNOSIS — Z993 Dependence on wheelchair: Secondary | ICD-10-CM | POA: Diagnosis not present

## 2024-03-10 DIAGNOSIS — E0590 Thyrotoxicosis, unspecified without thyrotoxic crisis or storm: Secondary | ICD-10-CM | POA: Diagnosis not present

## 2024-03-10 DIAGNOSIS — I10 Essential (primary) hypertension: Secondary | ICD-10-CM | POA: Diagnosis not present

## 2024-03-10 DIAGNOSIS — F03B0 Unspecified dementia, moderate, without behavioral disturbance, psychotic disturbance, mood disturbance, and anxiety: Secondary | ICD-10-CM | POA: Diagnosis not present

## 2024-03-10 DIAGNOSIS — R339 Retention of urine, unspecified: Secondary | ICD-10-CM | POA: Diagnosis not present

## 2024-03-12 DIAGNOSIS — M6281 Muscle weakness (generalized): Secondary | ICD-10-CM | POA: Diagnosis not present

## 2024-03-30 DIAGNOSIS — H11821 Conjunctivochalasis, right eye: Secondary | ICD-10-CM | POA: Diagnosis not present

## 2024-04-12 DIAGNOSIS — M6281 Muscle weakness (generalized): Secondary | ICD-10-CM | POA: Diagnosis not present

## 2024-05-11 DIAGNOSIS — N390 Urinary tract infection, site not specified: Secondary | ICD-10-CM | POA: Diagnosis not present

## 2024-05-12 DIAGNOSIS — M6281 Muscle weakness (generalized): Secondary | ICD-10-CM | POA: Diagnosis not present

## 2024-05-13 ENCOUNTER — Ambulatory Visit (INDEPENDENT_AMBULATORY_CARE_PROVIDER_SITE_OTHER): Payer: Medicare Other

## 2024-05-13 DIAGNOSIS — I428 Other cardiomyopathies: Secondary | ICD-10-CM | POA: Diagnosis not present

## 2024-05-13 LAB — CUP PACEART REMOTE DEVICE CHECK
Battery Remaining Longevity: 19 mo
Battery Remaining Percentage: 17 %
Battery Voltage: 2.87 V
Brady Statistic AP VP Percent: 4.7 %
Brady Statistic AP VS Percent: 1 %
Brady Statistic AS VP Percent: 94 %
Brady Statistic AS VS Percent: 1.3 %
Brady Statistic RA Percent Paced: 4.5 %
Brady Statistic RV Percent Paced: 98 %
Date Time Interrogation Session: 20251009020013
Implantable Lead Connection Status: 753985
Implantable Lead Connection Status: 753985
Implantable Lead Implant Date: 20170417
Implantable Lead Implant Date: 20170417
Implantable Lead Location: 753859
Implantable Lead Location: 753860
Implantable Pulse Generator Implant Date: 20170417
Lead Channel Impedance Value: 390 Ohm
Lead Channel Impedance Value: 410 Ohm
Lead Channel Pacing Threshold Amplitude: 0.375 V
Lead Channel Pacing Threshold Amplitude: 0.75 V
Lead Channel Pacing Threshold Pulse Width: 0.5 ms
Lead Channel Pacing Threshold Pulse Width: 0.5 ms
Lead Channel Sensing Intrinsic Amplitude: 1.2 mV
Lead Channel Sensing Intrinsic Amplitude: 12 mV
Lead Channel Setting Pacing Amplitude: 1 V
Lead Channel Setting Pacing Amplitude: 1.375
Lead Channel Setting Pacing Pulse Width: 0.5 ms
Lead Channel Setting Sensing Sensitivity: 2 mV
Pulse Gen Model: 2272
Pulse Gen Serial Number: 7883162

## 2024-05-13 NOTE — Progress Notes (Signed)
 Remote PPM Transmission

## 2024-05-16 NOTE — Progress Notes (Signed)
 Remote PPM Transmission

## 2024-05-19 ENCOUNTER — Ambulatory Visit: Payer: Self-pay | Admitting: Internal Medicine

## 2024-05-31 DIAGNOSIS — I470 Re-entry ventricular arrhythmia: Secondary | ICD-10-CM | POA: Diagnosis not present

## 2024-05-31 DIAGNOSIS — G309 Alzheimer's disease, unspecified: Secondary | ICD-10-CM | POA: Diagnosis not present

## 2024-05-31 DIAGNOSIS — E039 Hypothyroidism, unspecified: Secondary | ICD-10-CM | POA: Diagnosis not present

## 2024-05-31 DIAGNOSIS — R030 Elevated blood-pressure reading, without diagnosis of hypertension: Secondary | ICD-10-CM | POA: Diagnosis not present

## 2024-05-31 DIAGNOSIS — K219 Gastro-esophageal reflux disease without esophagitis: Secondary | ICD-10-CM | POA: Diagnosis not present

## 2024-05-31 DIAGNOSIS — E46 Unspecified protein-calorie malnutrition: Secondary | ICD-10-CM | POA: Diagnosis not present

## 2024-05-31 DIAGNOSIS — M810 Age-related osteoporosis without current pathological fracture: Secondary | ICD-10-CM | POA: Diagnosis not present

## 2024-05-31 DIAGNOSIS — H409 Unspecified glaucoma: Secondary | ICD-10-CM | POA: Diagnosis not present

## 2024-05-31 DIAGNOSIS — I4891 Unspecified atrial fibrillation: Secondary | ICD-10-CM | POA: Diagnosis not present

## 2024-06-08 DIAGNOSIS — Z466 Encounter for fitting and adjustment of urinary device: Secondary | ICD-10-CM | POA: Diagnosis not present

## 2024-06-08 DIAGNOSIS — R339 Retention of urine, unspecified: Secondary | ICD-10-CM | POA: Diagnosis not present

## 2024-06-14 DIAGNOSIS — E46 Unspecified protein-calorie malnutrition: Secondary | ICD-10-CM | POA: Diagnosis not present

## 2024-06-14 DIAGNOSIS — R059 Cough, unspecified: Secondary | ICD-10-CM | POA: Diagnosis not present

## 2024-06-14 DIAGNOSIS — G301 Alzheimer's disease with late onset: Secondary | ICD-10-CM | POA: Diagnosis not present

## 2024-06-14 DIAGNOSIS — J4520 Mild intermittent asthma, uncomplicated: Secondary | ICD-10-CM | POA: Diagnosis not present

## 2024-06-14 DIAGNOSIS — I5022 Chronic systolic (congestive) heart failure: Secondary | ICD-10-CM | POA: Diagnosis not present

## 2024-06-14 DIAGNOSIS — E0590 Thyrotoxicosis, unspecified without thyrotoxic crisis or storm: Secondary | ICD-10-CM | POA: Diagnosis not present

## 2024-06-16 DIAGNOSIS — R059 Cough, unspecified: Secondary | ICD-10-CM | POA: Diagnosis not present

## 2024-07-07 DIAGNOSIS — N3000 Acute cystitis without hematuria: Secondary | ICD-10-CM | POA: Diagnosis not present

## 2024-08-09 ENCOUNTER — Ambulatory Visit: Admitting: Physician Assistant

## 2024-08-09 ENCOUNTER — Encounter: Payer: Self-pay | Admitting: Physician Assistant

## 2024-08-09 DIAGNOSIS — D1801 Hemangioma of skin and subcutaneous tissue: Secondary | ICD-10-CM

## 2024-08-09 DIAGNOSIS — S0081XA Abrasion of other part of head, initial encounter: Secondary | ICD-10-CM

## 2024-08-09 DIAGNOSIS — Z789 Other specified health status: Secondary | ICD-10-CM

## 2024-08-09 DIAGNOSIS — T148XXA Other injury of unspecified body region, initial encounter: Secondary | ICD-10-CM

## 2024-08-09 NOTE — Patient Instructions (Signed)

## 2024-08-09 NOTE — Progress Notes (Signed)
" ° °  New Patient Visit   Subjective  Gabrielle Hoffman is a 89 y.o. female NEW PATIENT who presents for the following: Red blotches of back. They do not seem to bother her. She has a patch on her chin. It has only been there a few days and seems to be going away. She also has a raw spot on her right great toe that she is putting zinc cream on.  Accompanied by son today.  The following portions of the chart were reviewed this encounter and updated as appropriate: medications, allergies, medical history  Review of Systems:  No other skin or systemic complaints except as noted in HPI or Assessment and Plan.  Objective  Well appearing patient in no apparent distress; mood and affect are within normal limits.   A focused examination was performed of the following areas: FACE, BACK AND RIGHT FOOT    Relevant exam findings are noted in the Assessment and Plan.    Assessment & Plan   HEMANGIOMA Exam: red papule(s) Discussed benign nature. Recommend observation. Call for changes.  EXCORIATION Exam: Excoriation at ant chin.  Treatment Plan: Recommend vaseline. Call if not resolving.  Normal appearing skin of right great toe   CHERRY ANGIOMA   EXCORIATION   NORMAL SKIN APPEARANCE    Return if symptoms worsen or fail to improve.  I, Roseline Hutchinson, CMA, am acting as scribe for Abdulrahim Siddiqi K, PA-C .   Documentation: I have reviewed the above documentation for accuracy and completeness, and I agree with the above.  Simranjit Thayer K, PA-C    "

## 2024-08-12 ENCOUNTER — Ambulatory Visit: Payer: Medicare Other

## 2024-08-12 DIAGNOSIS — I428 Other cardiomyopathies: Secondary | ICD-10-CM

## 2024-08-15 LAB — CUP PACEART REMOTE DEVICE CHECK
Battery Remaining Longevity: 17 mo
Battery Remaining Percentage: 14 %
Battery Voltage: 2.86 V
Brady Statistic AP VP Percent: 4.5 %
Brady Statistic AP VS Percent: 1 %
Brady Statistic AS VP Percent: 94 %
Brady Statistic AS VS Percent: 1.1 %
Brady Statistic RA Percent Paced: 4.3 %
Brady Statistic RV Percent Paced: 99 %
Date Time Interrogation Session: 20260109020016
Implantable Lead Connection Status: 753985
Implantable Lead Connection Status: 753985
Implantable Lead Implant Date: 20170417
Implantable Lead Implant Date: 20170417
Implantable Lead Location: 753859
Implantable Lead Location: 753860
Implantable Pulse Generator Implant Date: 20170417
Lead Channel Impedance Value: 400 Ohm
Lead Channel Impedance Value: 400 Ohm
Lead Channel Pacing Threshold Amplitude: 0.5 V
Lead Channel Pacing Threshold Amplitude: 0.875 V
Lead Channel Pacing Threshold Pulse Width: 0.5 ms
Lead Channel Pacing Threshold Pulse Width: 0.5 ms
Lead Channel Sensing Intrinsic Amplitude: 1.1 mV
Lead Channel Sensing Intrinsic Amplitude: 10.1 mV
Lead Channel Setting Pacing Amplitude: 1.125
Lead Channel Setting Pacing Amplitude: 1.5 V
Lead Channel Setting Pacing Pulse Width: 0.5 ms
Lead Channel Setting Sensing Sensitivity: 2 mV
Pulse Gen Model: 2272
Pulse Gen Serial Number: 7883162

## 2024-08-17 NOTE — Progress Notes (Signed)
 Remote PPM Transmission

## 2024-08-19 ENCOUNTER — Ambulatory Visit: Payer: Self-pay | Admitting: Cardiovascular Disease

## 2024-10-18 ENCOUNTER — Ambulatory Visit: Admitting: Physician Assistant
